# Patient Record
Sex: Male | Born: 1975 | Race: Black or African American | Hispanic: No | Marital: Married | Smoking: Never smoker
Health system: Southern US, Community
[De-identification: ages and names within clinical notes are randomized; demographics above are authoritative.]

## PROBLEM LIST (undated history)

## (undated) DIAGNOSIS — F419 Anxiety disorder, unspecified: Secondary | ICD-10-CM

## (undated) DIAGNOSIS — T7840XA Allergy, unspecified, initial encounter: Secondary | ICD-10-CM

## (undated) DIAGNOSIS — G5600 Carpal tunnel syndrome, unspecified upper limb: Secondary | ICD-10-CM

## (undated) DIAGNOSIS — F329 Major depressive disorder, single episode, unspecified: Secondary | ICD-10-CM

## (undated) DIAGNOSIS — F32A Depression, unspecified: Secondary | ICD-10-CM

## (undated) DIAGNOSIS — J302 Other seasonal allergic rhinitis: Secondary | ICD-10-CM

## (undated) DIAGNOSIS — E559 Vitamin D deficiency, unspecified: Secondary | ICD-10-CM

## (undated) DIAGNOSIS — R7989 Other specified abnormal findings of blood chemistry: Secondary | ICD-10-CM

## (undated) DIAGNOSIS — L309 Dermatitis, unspecified: Secondary | ICD-10-CM

## (undated) DIAGNOSIS — R011 Cardiac murmur, unspecified: Secondary | ICD-10-CM

## (undated) DIAGNOSIS — G4700 Insomnia, unspecified: Secondary | ICD-10-CM

## (undated) HISTORY — PX: NO PAST SURGERIES: SHX2092

## (undated) HISTORY — DX: Allergy, unspecified, initial encounter: T78.40XA

## (undated) HISTORY — DX: Dermatitis, unspecified: L30.9

## (undated) HISTORY — DX: Other specified abnormal findings of blood chemistry: R79.89

## (undated) HISTORY — DX: Other seasonal allergic rhinitis: J30.2

## (undated) HISTORY — DX: Depression, unspecified: F32.A

## (undated) HISTORY — DX: Vitamin D deficiency, unspecified: E55.9

## (undated) HISTORY — PX: COLONOSCOPY: SHX174

## (undated) HISTORY — DX: Anxiety disorder, unspecified: F41.9

## (undated) HISTORY — DX: Insomnia, unspecified: G47.00

---
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
## 2000-03-25 ENCOUNTER — Emergency Department (HOSPITAL_COMMUNITY): Admission: EM | Admit: 2000-03-25 | Discharge: 2000-03-25 | Payer: Self-pay | Admitting: Emergency Medicine

## 2002-08-26 ENCOUNTER — Ambulatory Visit (HOSPITAL_COMMUNITY): Admission: RE | Admit: 2002-08-26 | Discharge: 2002-08-26 | Payer: Self-pay | Admitting: Family Medicine

## 2004-06-29 ENCOUNTER — Emergency Department (HOSPITAL_COMMUNITY): Admission: EM | Admit: 2004-06-29 | Discharge: 2004-06-29 | Payer: Self-pay | Admitting: Emergency Medicine

## 2007-05-13 ENCOUNTER — Emergency Department (HOSPITAL_COMMUNITY): Admission: EM | Admit: 2007-05-13 | Discharge: 2007-05-13 | Payer: Self-pay | Admitting: Family Medicine

## 2007-05-14 ENCOUNTER — Encounter: Payer: Self-pay | Admitting: Family Medicine

## 2007-06-13 ENCOUNTER — Emergency Department (HOSPITAL_COMMUNITY): Admission: EM | Admit: 2007-06-13 | Discharge: 2007-06-14 | Payer: Self-pay | Admitting: Emergency Medicine

## 2007-06-15 ENCOUNTER — Ambulatory Visit: Payer: Self-pay | Admitting: Family Medicine

## 2009-06-26 ENCOUNTER — Emergency Department (HOSPITAL_COMMUNITY): Admission: EM | Admit: 2009-06-26 | Discharge: 2009-06-26 | Payer: Self-pay | Admitting: Family Medicine

## 2010-06-12 NOTE — Letter (Signed)
Summary: RPC chart  RPC chart   Imported By: Curtis Sites 12/01/2009 11:31:30  _____________________________________________________________________  External Attachment:    Type:   Image     Comment:   External Document

## 2010-06-12 NOTE — Letter (Signed)
Summary: progress notes  progress notes   Imported By: Curtis Sites 12/01/2009 11:32:18  _____________________________________________________________________  External Attachment:    Type:   Image     Comment:   External Document

## 2010-09-28 NOTE — Procedures (Signed)
   Gabriel [name], Gabriel [name]                       ACCOUNT NO.:  [id number]   MEDICAL RECORD NO.:  [id number]                   PATIENT TYPE:  OUT   LOCATION:  RAD                                  FACILITY:  [facility]   PHYSICIAN:  Vida Roller, M.D.                DATE OF BIRTH:  12-Dec-1975   DATE OF PROCEDURE:  08/26/2002  DATE OF DISCHARGE:                                  ECHOCARDIOGRAM   Tape Number [id number], tape count [id number] through 5566.   This is a 35 year old gentleman with a murmur.  Technical quality of the  study is good.   M MODE TRACINGS:  The aorta is 32 mm.   The left atrium is 32 mm.   Septum is 13 mm which is enlarged.   Posterior wall is 13 mm which is enlarged.   Left ventricular diastolic dimension is 49 mm.   Left ventricular systolic dimension is 32 mm.   TWO-D DOPPLER IMAGING:  The left ventricle is normal size with mild  concentric left ventricular hypertrophy.  Systolic function appears normal,  and diastolic function in this limited study also appears normal.  There  were no wall motion abnormalities seen.   The right ventricle is normal size with normal systolic function.   Both atria are normal size.  There is no evidence of atrioseptal defect.   The aortic valve is morphologically unremarkable with trace insufficiency,  and no stenosis is seen.   The mitral valve is morphologically unremarkable with trace to mild  insufficiency.  No stenosis is seen.   The tricuspid valve is morphologically unremarkable with mild insufficiency.  No stenosis is seen.   The pulmonic valve is not well seen but appears to have trace insufficiency.   Pericardial structures appear normal.   The ascending aorta appears normal.   The inferior vena cava appears to be normal size.                                               Vida Roller, M.D.    JH/MEDQ  D:  08/27/2002  T:  08/27/2002  Job:  [id number]

## 2011-02-01 LAB — CBC
HCT: 45.5
Hemoglobin: 15.6
WBC: 9.2

## 2011-02-01 LAB — COMPREHENSIVE METABOLIC PANEL
Albumin: 4
Alkaline Phosphatase: 82
BUN: 16
Chloride: 104
Glucose, Bld: 129 — ABNORMAL HIGH
Potassium: 3.9
Total Bilirubin: 0.9

## 2011-02-01 LAB — DIFFERENTIAL
Basophils Absolute: 0
Basophils Relative: 0
Monocytes Absolute: 0.5
Neutro Abs: 8.3 — ABNORMAL HIGH
Neutrophils Relative %: 90 — ABNORMAL HIGH

## 2012-10-08 ENCOUNTER — Encounter: Payer: Self-pay | Admitting: Family Medicine

## 2012-10-15 ENCOUNTER — Encounter: Payer: Self-pay | Admitting: Family Medicine

## 2012-10-19 ENCOUNTER — Encounter: Payer: Self-pay | Admitting: Family Medicine

## 2012-10-19 ENCOUNTER — Ambulatory Visit (INDEPENDENT_AMBULATORY_CARE_PROVIDER_SITE_OTHER): Payer: BC Managed Care – PPO | Admitting: Family Medicine

## 2012-10-19 VITALS — BP 120/82 | HR 74 | Resp 18 | Ht 72.5 in | Wt 197.1 lb

## 2012-10-19 DIAGNOSIS — L2084 Intrinsic (allergic) eczema: Secondary | ICD-10-CM | POA: Insufficient documentation

## 2012-10-19 DIAGNOSIS — J3089 Other allergic rhinitis: Secondary | ICD-10-CM

## 2012-10-19 DIAGNOSIS — R7302 Impaired glucose tolerance (oral): Secondary | ICD-10-CM

## 2012-10-19 DIAGNOSIS — J309 Allergic rhinitis, unspecified: Secondary | ICD-10-CM

## 2012-10-19 DIAGNOSIS — L209 Atopic dermatitis, unspecified: Secondary | ICD-10-CM

## 2012-10-19 DIAGNOSIS — R7309 Other abnormal glucose: Secondary | ICD-10-CM

## 2012-10-19 DIAGNOSIS — R5381 Other malaise: Secondary | ICD-10-CM

## 2012-10-19 DIAGNOSIS — R5383 Other fatigue: Secondary | ICD-10-CM

## 2012-10-19 DIAGNOSIS — L2089 Other atopic dermatitis: Secondary | ICD-10-CM

## 2012-10-19 DIAGNOSIS — Z139 Encounter for screening, unspecified: Secondary | ICD-10-CM

## 2012-10-19 DIAGNOSIS — R7303 Prediabetes: Secondary | ICD-10-CM

## 2012-10-19 MED ORDER — FLUTICASONE PROPIONATE 50 MCG/ACT NA SUSP: 1.0000 | Freq: Every day | NASAL | Status: DC

## 2012-10-19 MED ORDER — LORATADINE 10 MG PO TABS: 10.0000 mg | ORAL_TABLET | Freq: Every day | ORAL | Status: DC | PRN

## 2012-10-19 MED ORDER — TRIAMCINOLONE ACETONIDE 0.025 % EX OINT: TOPICAL_OINTMENT | CUTANEOUS | Status: DC

## 2012-10-19 NOTE — Progress Notes (Signed)
  Subjective:    Patient ID: Gabriel [name], male    DOB: 05/28/1975, 37 y.o.   MRN: [id number]  HPI  The PT is here to  Re establish care, last seen over 4 years ago He is generally good health, just wants to be checked for chronic illness in his family, and to ge a  "check up"   Preventive health is updated, specifically Immunization.   . 3 month h/o intermittent left hand pain and tingling/numbness, wakes him at night he has to flick for relief. No weakness or residual loss of sensation Year round allergies, sneezing , runny nose , watery eyes, denies associated fever , chills, productive cough or yellow nasal drainage States he iis aware diet and commitment to regular exercise need to improve so that his health does , and he is invested in this change  Review of Systems See HPI Denies recent fever or chills. Denies ear pain or sore throat. Denies chest congestion, productive cough or wheezing. Denies chest pains, palpitations and leg swelling Denies abdominal pain, nausea, vomiting,diarrhea or constipation.   Denies dysuria, frequency, hesitancy or incontinence.  Denies headaches, seizures, numbness, or tingling. Denies depression, anxiety or insomnia. C/o pruritic rash on back, has established dx of eczema        Objective:   Physical Exam  Patient alert and oriented and in no cardiopulmonary distress.  HEENT: No facial asymmetry, EOMI, no sinus tenderness,  oropharynx pink and moist.  Neck supple no adenopathy.  Chest: Clear to auscultation bilaterally.  CVS: S1, S2 no murmurs, no S3.  ABD: Soft non tender. Bowel sounds normal.  Ext: No edema  MS: Adequate ROM spine, shoulders, hips and knees.  Skin: Intact, eczema moderately severe, dispersed extensively on upper back with hyperpigmented macular lesions  Psych: Good eye contact, normal affect. Memory intact not anxious or depressed appearing.  CNS: CN 2-12 intact, power, tone and sensation normal  throughout.       Assessment & Plan:

## 2012-10-19 NOTE — Patient Instructions (Addendum)
F/u in 5.5 month  Call if you need me before  Continue daily physical activity.  Please work on eating habits, healthy food choices are fresh or frozen fruit and vegetable, aim for 66% of intake to be this.Water is the healthiest drink  Reduce cheese, fried and fatty foods and red meat, and egg yolks.Eat a lot baked fish, white chicken meat, [place]. Beans and lentils , no fat back, are excellent sources of protein and fiber  Tingling in left hand sounds like carpal tunnel, when you decide you want further investigation, pls call I will refer you to the specialist of your choice  You are mildly overweight, ideal weight is about 185 pounds, changing foods and consistency in exercise will get you there   New for allergies is flonase spray, use daily, and buy generic loratidine (claritin) you will get script  Fasting CBC, lipid, chem7, TSH, HBA1C, vit D

## 2012-10-22 LAB — CBC WITH DIFFERENTIAL/PLATELET
Basophils Absolute: 0.1 10*3/uL (ref 0.0–0.1)
Basophils Relative: 1 % (ref 0–1)
Hemoglobin: 14.9 g/dL (ref 13.0–17.0)
MCHC: 34.2 g/dL (ref 30.0–36.0)
Monocytes Relative: 7 % (ref 3–12)
Neutro Abs: 2.1 10*3/uL (ref 1.7–7.7)
Neutrophils Relative %: 42 % — ABNORMAL LOW (ref 43–77)

## 2012-10-22 LAB — LIPID PANEL
Cholesterol: 147 mg/dL (ref 0–200)
Triglycerides: 82 mg/dL (ref <150)
VLDL: 16 mg/dL (ref 0–40)

## 2012-10-22 LAB — BASIC METABOLIC PANEL
BUN: 14 mg/dL (ref 6–23)
CO2: 31 mEq/L (ref 19–32)
Chloride: 102 mEq/L (ref 96–112)
Creat: 1.15 mg/dL (ref 0.50–1.35)
Glucose, Bld: 99 mg/dL (ref 70–99)

## 2012-10-31 ENCOUNTER — Encounter: Payer: Self-pay | Admitting: Family Medicine

## 2012-10-31 DIAGNOSIS — R7302 Impaired glucose tolerance (oral): Secondary | ICD-10-CM | POA: Insufficient documentation

## 2012-10-31 NOTE — Assessment & Plan Note (Signed)
positive family h/o IGT/diabetes. Pt needs to focus on low carb, healthy food choice and commitent to exercise, to reduce his risk of developing diabetes, he has been made aware of this

## 2012-10-31 NOTE — Assessment & Plan Note (Signed)
Educated re need to commit to daily medication for symptom control

## 2012-10-31 NOTE — Assessment & Plan Note (Signed)
moderate potency steroid cream prescribed for as needed, use. Current , moderate flare

## 2012-11-03 NOTE — Addendum Note (Signed)
Addended by: Kandis Fantasia B on: 11/03/2012 11:49 AM   Modules accepted: Orders

## 2013-03-18 ENCOUNTER — Other Ambulatory Visit: Payer: Self-pay

## 2013-04-22 ENCOUNTER — Ambulatory Visit: Payer: BC Managed Care – PPO | Admitting: Family Medicine

## 2013-05-20 ENCOUNTER — Other Ambulatory Visit: Payer: Self-pay | Admitting: Family Medicine

## 2013-05-20 ENCOUNTER — Ambulatory Visit (INDEPENDENT_AMBULATORY_CARE_PROVIDER_SITE_OTHER): Payer: BC Managed Care – PPO | Admitting: Family Medicine

## 2013-05-20 VITALS — BP 116/70 | HR 74 | Resp 18 | Ht 72.5 in | Wt 198.1 lb

## 2013-05-20 DIAGNOSIS — R7302 Impaired glucose tolerance (oral): Secondary | ICD-10-CM

## 2013-05-20 DIAGNOSIS — Z139 Encounter for screening, unspecified: Secondary | ICD-10-CM

## 2013-05-20 DIAGNOSIS — J3089 Other allergic rhinitis: Secondary | ICD-10-CM

## 2013-05-20 DIAGNOSIS — Z13220 Encounter for screening for lipoid disorders: Secondary | ICD-10-CM

## 2013-05-20 DIAGNOSIS — J309 Allergic rhinitis, unspecified: Secondary | ICD-10-CM

## 2013-05-20 DIAGNOSIS — R7309 Other abnormal glucose: Secondary | ICD-10-CM

## 2013-05-20 DIAGNOSIS — E663 Overweight: Secondary | ICD-10-CM

## 2013-05-20 LAB — HEMOGLOBIN A1C
HEMOGLOBIN A1C: 6.4 % — AB (ref <5.7)
Mean Plasma Glucose: 137 mg/dL — ABNORMAL HIGH (ref <117)

## 2013-05-20 NOTE — Patient Instructions (Signed)
F/u in 6 month, call if you need me before  You will be contacted re labs  pls continue to make changes in eating  Fasting lipid, chem 7, HBA1C , CBC in 6 months, just before next visit  Watch intermittent cramping, call if worsens, you will get list of potassium rich foods

## 2013-05-20 NOTE — Progress Notes (Signed)
   Subjective:    Patient ID: Gabriel [name], male    DOB: 10/29/1975, 38 y.o.   MRN: [id number]  HPI Pt in for general follow up specifically because of worsened HBA1C, now 6.4.Pt has been aware of this problem has worked at correcting it inconsistently, now appears to be more concerned and wants to go for individual dietary counseling also. Allergies are not currently an issue  Review of Systems See HPI Denies recent fever or chills. Denies sinus pressure, nasal congestion, ear pain or sore throat. Denies chest congestion, productive cough or wheezing. Denies chest pains, palpitations and leg swelling Denies abdominal pain, nausea, vomiting,diarrhea or constipation.   Denies dysuria, frequency, hesitancy or incontinence. Denies joint pain, swelling and limitation in mobility.        Objective:   Physical Exam BP 116/70  Pulse 74  Resp 18  Ht 6' 0.5" (1.842 m)  Wt 198 lb 1.9 oz (89.867 kg)  BMI 26.49 kg/m2  SpO2 98% Patient alert and oriented and in no cardiopulmonary distress.  HEENT: No facial asymmetry, EOMI, no sinus tenderness,  oropharynx pink and moist.  Neck supple no adenopathy.  Chest: Clear to auscultation bilaterally.  CVS: S1, S2 no murmurs, no S3.  ABD: Soft non tender. Bowel sounds normal.  Ext: No edema  MS: Adequate ROM spine, shoulders, hips and knees.  Skin: Intact, no ulcerations or rash noted.  Psych: Good eye contact, normal affect. Memory intact not anxious or depressed appearing.  CNS: CN 2-12 intact, power, tone and sensation normal throughout.        Assessment & Plan:  IGT (impaired glucose tolerance) Deteriorated. Patient educated about the importance of limiting  Carbohydrate intake , the need to commit to daily physical activity for a minimum of 30 minutes , and to commit weight loss. The fact that changes in all these areas will reduce or eliminate all together the development of diabetes is stressed.   Referred  forindividual nutritional ed  Overweight (BMI 25.0-29.9) Unchanged Patient re-educated about  the importance of commitment to a  minimum of 150 minutes of exercise per week. The importance of healthy food choices with portion control discussed. Encouraged to start a food diary, count calories and to consider  joining a support group. Sample diet sheets offered. Goals set by the patient for the next several months.     Perennial allergic rhinitis No current flare , currently needs no medication, uses OTC meds on as needed basis only which is appropriate

## 2013-05-21 ENCOUNTER — Telehealth (HOSPITAL_COMMUNITY): Payer: Self-pay | Admitting: Dietician

## 2013-05-21 NOTE — Telephone Encounter (Signed)
Received referral via fax from Dr. Simpson for dx: prediabetes, obesity.  

## 2013-05-21 NOTE — Addendum Note (Signed)
Addended by: Kandis FantasiaSLADE, COURTNEY B on: 05/21/2013 11:37 AM   Modules accepted: Orders

## 2013-05-21 NOTE — Telephone Encounter (Signed)
Called [phone]. Appointment scheduled for 04/03/14 at 1100.

## 2013-06-01 ENCOUNTER — Telehealth: Payer: Self-pay

## 2013-06-01 DIAGNOSIS — J111 Influenza due to unidentified influenza virus with other respiratory manifestations: Secondary | ICD-10-CM

## 2013-06-01 DIAGNOSIS — R69 Illness, unspecified: Principal | ICD-10-CM

## 2013-06-01 MED ORDER — OSELTAMIVIR PHOSPHATE 75 MG PO CAPS: 75.0000 mg | ORAL_CAPSULE | Freq: Two times a day (BID) | ORAL | Status: DC

## 2013-06-01 NOTE — Telephone Encounter (Signed)
Acute onset of fever yes Headache yes Body ache yes Fatigue yes Non productive cough no Sore throat no Nasal discharge yes  Onset between 1-4 days of possible exposure yes  Recommendation:  Fluid, rest, Tylenol for control of fever  Expect 5 days before feeling better and not so contagious and generally better in 7-10 days.  Standard treatment Tama flu 75 mg 1 tablet twice daily times 5 days  Please call office if symptoms do not improve or worsen in 5 days after beginning treatment.   Patient aware of all advise.  Tamiflu started.

## 2013-06-03 ENCOUNTER — Encounter: Payer: Self-pay | Admitting: Family Medicine

## 2013-06-03 ENCOUNTER — Encounter (HOSPITAL_COMMUNITY): Payer: Self-pay | Admitting: Dietician

## 2013-06-03 ENCOUNTER — Ambulatory Visit (INDEPENDENT_AMBULATORY_CARE_PROVIDER_SITE_OTHER): Payer: BC Managed Care – PPO | Admitting: Family Medicine

## 2013-06-03 VITALS — BP 112/78 | HR 60 | Temp 98.5°F | Resp 16 | Ht 72.5 in | Wt 190.8 lb

## 2013-06-03 DIAGNOSIS — J111 Influenza due to unidentified influenza virus with other respiratory manifestations: Secondary | ICD-10-CM | POA: Insufficient documentation

## 2013-06-03 DIAGNOSIS — B9689 Other specified bacterial agents as the cause of diseases classified elsewhere: Secondary | ICD-10-CM

## 2013-06-03 DIAGNOSIS — J019 Acute sinusitis, unspecified: Secondary | ICD-10-CM

## 2013-06-03 MED ORDER — BENZONATATE 100 MG PO CAPS: 100.0000 mg | ORAL_CAPSULE | Freq: Three times a day (TID) | ORAL | Status: DC | PRN

## 2013-06-03 MED ORDER — AZITHROMYCIN 250 MG PO TABS: ORAL_TABLET | ORAL | Status: AC

## 2013-06-03 NOTE — Progress Notes (Signed)
Outpatient Initial Nutrition Assessment  Date:06/03/2013   Appt Start Time: 1104  Referring Physician: Dr. Moshe Cipro Reason for Visit: prediabetes  Nutrition Assessment:  Height: 6' 0.5" (184.2 cm)   Weight: 188 lb (85.276 kg)   IBW: 181#  %IBW: 104% UBW: 198#  %UBW: 95% Body mass index is 25.13 kg/(m^2). Meets criteria for overweight.  Goal Weight: Maintenance.  Weight hx: Wt Readings from Last 10 Encounters:  06/03/13 188 lb (85.276 kg)  06/03/13 190 lb 12.8 oz (86.546 kg)  05/20/13 198 lb 1.9 oz (89.867 kg)  10/19/12 197 lb 1.3 oz (89.395 kg)   Noted 10# (55) wt loss x 3 weeks.  Estimated nutritional needs:  Kcals/ day: 1900-2100 Protein (grams)/day: 68-85 Fluid (L)/ day: 1.9-2.1  PMH: History reviewed. No pertinent past medical history.  Medications:  Current Outpatient Rx  Name  Route  Sig  Dispense  Refill  . azithromycin (ZITHROMAX Z-PAK) 250 MG tablet      Take 2 tablets (500 mg) on  Day 1,  followed by 1 tablet (250 mg) once daily on Days 2 through 5.   6 each   0   . benzonatate (TESSALON) 100 MG capsule   Oral   Take 1 capsule (100 mg total) by mouth 3 (three) times daily as needed for cough.   20 capsule   0   . fluticasone (FLONASE) 50 MCG/ACT nasal spray   Nasal   Place 1 spray into the nose daily.   16 g   5   . loratadine (CLARITIN) 10 MG tablet   Oral   Take 1 tablet (10 mg total) by mouth daily as needed for allergies.   30 tablet   5   . oseltamivir (TAMIFLU) 75 MG capsule   Oral   Take 1 capsule (75 mg total) by mouth 2 (two) times daily.   10 capsule   0     Labs: CMP     Component Value Date/Time   NA 142 10/22/2012 0811   K 4.5 10/22/2012 0811   CL 102 10/22/2012 0811   CO2 31 10/22/2012 0811   GLUCOSE 99 10/22/2012 0811   BUN 14 10/22/2012 0811   CREATININE 1.15 10/22/2012 0811   CREATININE 1.06 06/14/2007 0100   CALCIUM 9.1 10/22/2012 0811   PROT 6.9 06/14/2007 0100   ALBUMIN 4.0 06/14/2007 0100   AST 51* 06/14/2007 0100   ALT 61*  06/14/2007 0100   ALKPHOS 82 06/14/2007 0100   BILITOT 0.9 06/14/2007 0100   GFRNONAA >60 06/14/2007 0100   GFRAA  Value: >60        The eGFR has been calculated using the MDRD equation. This calculation has not been validated in all clinical 06/14/2007 0100    Lipid Panel     Component Value Date/Time   CHOL 147 10/22/2012 0811   TRIG 82 10/22/2012 0811   HDL 40 10/22/2012 0811   CHOLHDL 3.7 10/22/2012 0811   VLDL 16 10/22/2012 0811   LDLCALC 91 10/22/2012 0811     Lab Results  Component Value Date   HGBA1C 6.4* 05/20/2013   HGBA1C 6.0* 10/22/2012   Lab Results  Component Value Date   LDLCALC 91 10/22/2012   CREATININE 1.15 10/22/2012     Lifestyle/ social habits: Mr. Borba is a delightful gentleman who resides in [place] with his wife (who works at [employer]) and his two sons, ages 28 and 60. He works as Librarian, academic at [employer], which requires him to travel to multiple  locations in the [place] and [place] area on a daily basis.  He reports he does not exercise outside of his daily routine, but his job requires a lot of walking. He reports a very low stress level.   Nutrition hx/habits: Mr. Gilpatrick reports that due to the nature of his job, he does not have a consistent meal schedule and often eats at fast food restaurants. Most recently, he reports he has been drinking more water and decreasing the amount of fruit juice he drinks. He reports he enjoys water and keeps multiple bottles in a cooler in his car. He is delighted that he has lost weight, but expresses concern about develop comorbidities, due to his strong family hx of diabetes and heart disease.  He skip breakfast 2-3 times per week. Breakfast and lunch are usually at fast food restaurants. Dinner are eaten at home and prepared by wife and are generally healthy. He also admits to consuming sugary beverages such as lemonade and sweet tea.   Diet recall: Breakfast: skip OR sausage egg and cheese biscuit,  orange juice; Lunch: burger, fries, and sweet tea; Dinner: chicken, vegetable, starch, lemonade.   Nutrition Diagnosis: Nutrition-related knowledge deficit r/t no prior diet education AEB Hgb A1c: 6.4.   Nutrition Intervention: Nutrition rx: 1800 kcal NAS, diabetic diet; 3 meals per day; no snacks; low calorie beverages only; limit one starch per meal; choose lean meats, fruits, vegetables, low fat dairy, and whole grain most often; be as active as possible  Education/Counseling Provided: Educated pt on principles of diabetic diet.Reviewed Hgb A1c and discussed how sedentary lifestyle, poor diet, and family hx put pt at high risk for development of diabetes. Reviewed diet recall and discussed discussed specific examples in which pt could choose more healthful options. Educated pt on plate method, portion sizes, and sources of carbohydrate. Discussed importance of regular meal pattern. Discussed importance of adding sources of whole grains to diet to improve glycemic control. Also encouraged to choose low fat dairy, lean meats, and whole fruits and vegetables more often. Discussed options of artificial sweeteners and encouraged pt to use which brand she liked best. Discussed nutritional content of foods commonly eaten and discussed healthier alternatives. Discussed importance of compliance to prevent further complications of disease. Educated pt on importance of physical activity (goal of at least 30 minutes 5 times per week) along with a healthy diet to achieve weight loss and glycemic goals. Encouraged slow, moderate weight loss of 1-2# per week, or 7-10% of current body weight. Provides "Ture Lemon" samples and coupons upon request.  Used TeachBack to assess understanding.   Understanding, Motivation, Ability to Follow Recommendations: Expect good compliance.   Monitoring and Evaluation: Goals: 1) Weight maintenance; 2) Hgb A1c < 5.7  Recommendations: 1) Keep cooler with healthy snacks and water; 2)  Consider packing own lunch or buying off the dollar menu; 3) Choose low calorie drink mixes for variety other than water  F/U: PRN. RD contact information provided.   Mikal Blasdell A. Jimmye Norman, RD, LDN 06/03/2013  Appt EndTime: 9150

## 2013-06-03 NOTE — Patient Instructions (Signed)
F/u as before  You are being treated for influenza and bacterial sinusitis.PLEASE finish the tamiflu, and added is azithromycin . This is an antibiotic  Which you will take for 5 days total,   Work excuse from 1/21 to return 06/07/2013  Influenza A (H1N1) H1N1 formerly called "swine flu" is a new influenza virus causing sickness in people. The H1N1 virus is different from seasonal influenza viruses. However, the H1N1 symptoms are similar to seasonal influenza and it is spread from person to person. You may be at higher risk for serious problems if you have underlying serious medical conditions. The CDC and the [employer] Health Organization are following reported cases around the world. CAUSES   The flu is thought to spread mainly person-to-person through coughing or sneezing of infected people.  A person may become infected by touching something with the virus on it and then touching their mouth or nose. SYMPTOMS   Fever.  Headache.  Tiredness.  Cough.  Sore throat.  Runny or stuffy nose.  Body aches.  Diarrhea and vomiting These symptoms are referred to as "flu-like symptoms." A lot of different illnesses, including the common cold, may have similar symptoms. DIAGNOSIS   There are tests that can tell if you have the H1N1 virus.  Confirmed cases of H1N1 will be reported to the state or local health department.  A doctor's exam may be needed to tell whether you have an infection that is a complication of the flu. HOME CARE INSTRUCTIONS   Stay informed. Visit the [facility] website for current recommendations. Visit [employer].tnwww.cdc.gov/H1N1flu/. You may also call 1-800-CDC-INFO ((367) 598-21111-607-617-4920).  Get help early if you develop any of the above symptoms.  If you are at high risk from complications of the flu, talk to your caregiver as soon as you develop flu-like symptoms. Those at higher risk for complications include:  People 65 years or older.  People with chronic medical  conditions.  Pregnant women.  Young children.  Your caregiver may recommend antiviral medicine to help treat the flu.  If you get the flu, get plenty of rest, drink enough water and fluids to keep your urine clear or pale yellow, and avoid using alcohol or tobacco.  You may take over-the-counter medicine to relieve the symptoms of the flu if your caregiver approves. (Never give aspirin to children or teenagers who have flu-like symptoms, particularly fever). TREATMENT  If you do get sick, antiviral drugs are available. These drugs can make your illness milder and make you feel better faster. Treatment should start soon after illness starts. It is only effective if taken within the first day of becoming ill. Only your caregiver can prescribe antiviral medication.  PREVENTION   Cover your nose and mouth with a tissue or your arm when you cough or sneeze. Throw the tissue away.  Wash your hands often with soap and warm water, especially after you cough or sneeze. Alcohol-based cleaners are also effective against germs.  Avoid touching your eyes, nose or mouth. This is one way germs spread.  Try to avoid contact with sick people. Follow public health advice regarding school closures. Avoid crowds.  Stay home if you get sick. Limit contact with others to keep from infecting them. People infected with the H1N1 virus may be able to infect others anywhere from 1 day before feeling sick to 5-7 days after getting flu symptoms.  An H1N1 vaccine is available to help protect against the virus. In addition to the H1N1 vaccine, you will need to  be vaccinated for seasonal influenza. The H1N1 and seasonal vaccines may be given on the same day. The CDC especially recommends the H1N1 vaccine for:  Pregnant women.  People who live with or care for children younger than 3 months of age.  Health care and emergency services personnel.  Persons between the ages of 47 months through 56 years of age.  People  from ages 1 through 84 years who are at higher risk for H1N1 because of chronic health disorders or immune system problems. FACEMASKS In community and home settings, the use of facemasks and N95 respirators are not normally recommended. In certain circumstances, a facemask or N95 respirator may be used for persons at increased risk of severe illness from influenza. Your caregiver can give additional recommendations for facemask use. IN CHILDREN, EMERGENCY WARNING SIGNS THAT NEED URGENT MEDICAL CARE:  Fast breathing or trouble breathing.  Bluish skin color.  Not drinking enough fluids.  Not waking up or not interacting normally.  Being so fussy that the child does not want to be held.  Your child has an oral temperature above 102 F (38.9 C), not controlled by medicine.  Your baby is older than 3 months with a rectal temperature of 102 F (38.9 C) or higher.  Your baby is 42 months old or younger with a rectal temperature of 100.4 F (38 C) or higher.  Flu-like symptoms improve but then return with fever and worse cough. IN ADULTS, EMERGENCY WARNING SIGNS THAT NEED URGENT MEDICAL CARE:  Difficulty breathing or shortness of breath.  Pain or pressure in the chest or abdomen.  Sudden dizziness.  Confusion.  Severe or persistent vomiting.  Bluish color.  You have a oral temperature above 102 F (38.9 C), not controlled by medicine.  Flu-like symptoms improve but return with fever and worse cough. SEEK IMMEDIATE MEDICAL CARE IF:  You or someone you know is experiencing any of the above symptoms. When you arrive at the emergency center, report that you think you have the flu. You may be asked to wear a mask and/or sit in a secluded area to protect others from getting sick. MAKE SURE YOU:   Understand these instructions.  Will watch your condition.  Will get help right away if you are not doing well or get worse. Some of this information courtesy of the CDC.  Document  Released: 10/16/2007 Document Revised: 07/22/2011 Document Reviewed: 10/16/2007 [facility] Patient Information 2014 [place], [place].

## 2013-06-03 NOTE — Progress Notes (Signed)
   Subjective:    Patient ID: Gabriel [name], male    DOB: 12/14/1975, 38 y.o.   MRN: [id number]  HPI  Pt experienced acute onset of body aches and chills, with fever 2 days ago, he called in and was started on tamiflu. Probably 40% improved, but still has fatigue, headache, bi temopral, body aches, , chills are better, started loose stools since 2 days ago, only 2 today, getting firm, no vomit. Green nasal drainage cough productive of yellow sputum over past 2 days  Review of Systems See HPI  Denies chest pains, palpitations and leg swelling    Denies dysuria, frequency, hesitancy or incontinence. Denies joint pain, swelling and limitation in mobility. Denies headaches, seizures, numbness, or tingling. Denies depression, anxiety or insomnia. Denies skin break down or rash.        Objective:   Physical Exam  Patient alert and oriented and in no cardiopulmonary distress.Ill appearing  HEENT: No facial asymmetry, EOMI, maxillary sinus tenderness,  oropharynx pink and moist.  Neck supple anterior cervical adenopathy.TM clear  Chest: Clear to auscultation bilaterally.  CVS: S1, S2 no murmurs, no S3.  ABD: Soft non tender. Bowel sounds normal.  Ext: No edema  MS: Adequate ROM spine, shoulders, hips and knees.  Skin: Intact, no ulcerations or rash noted.  Psych: Good eye contact, normal affect. Memory intact not anxious or depressed appearing.  CNS: CN 2-12 intact, power, tone and sensation normal throughout.       Assessment & Plan:

## 2013-06-06 DIAGNOSIS — B9689 Other specified bacterial agents as the cause of diseases classified elsewhere: Secondary | ICD-10-CM | POA: Insufficient documentation

## 2013-06-06 DIAGNOSIS — J019 Acute sinusitis, unspecified: Secondary | ICD-10-CM

## 2013-06-06 NOTE — Assessment & Plan Note (Signed)
Acute influenza improving pt to complete tamiflu, out of work for rest of the week Treatment for sinusitis and bronchitis started, z pack and decongestant prescribed

## 2013-06-06 NOTE — Assessment & Plan Note (Signed)
z pack and decongestant prescribed 

## 2013-06-17 ENCOUNTER — Other Ambulatory Visit: Payer: Self-pay

## 2013-06-17 DIAGNOSIS — L209 Atopic dermatitis, unspecified: Secondary | ICD-10-CM

## 2013-06-17 MED ORDER — TRIAMCINOLONE ACETONIDE 0.025 % EX OINT: TOPICAL_OINTMENT | CUTANEOUS | Status: DC

## 2013-07-03 ENCOUNTER — Encounter: Payer: Self-pay | Admitting: Family Medicine

## 2013-07-03 DIAGNOSIS — E663 Overweight: Secondary | ICD-10-CM | POA: Insufficient documentation

## 2013-07-03 NOTE — Assessment & Plan Note (Signed)
Deteriorated. Patient educated about the importance of limiting  Carbohydrate intake , the need to commit to daily physical activity for a minimum of 30 minutes , and to commit weight loss. The fact that changes in all these areas will reduce or eliminate all together the development of diabetes is stressed.   Referred forindividual nutritional ed

## 2013-07-03 NOTE — Assessment & Plan Note (Signed)
Unchanged. Patient re-educated about  the importance of commitment to a  minimum of 150 minutes of exercise per week. The importance of healthy food choices with portion control discussed. Encouraged to start a food diary, count calories and to consider  joining a support group. Sample diet sheets offered. Goals set by the patient for the next several months.    

## 2013-07-03 NOTE — Assessment & Plan Note (Signed)
No current flare , currently needs no medication, uses OTC meds on as needed basis only which is appropriate

## 2013-07-05 ENCOUNTER — Telehealth: Payer: Self-pay | Admitting: Family Medicine

## 2013-07-05 NOTE — Telephone Encounter (Signed)
Called and spoke with patient.  He is aware that he only need labs done in 3 months and not ov at this time.  Will send reminder letter on 4/1

## 2013-08-26 LAB — BASIC METABOLIC PANEL
BUN: 14 mg/dL (ref 6–23)
CHLORIDE: 105 meq/L (ref 96–112)
CO2: 31 meq/L (ref 19–32)
CREATININE: 0.98 mg/dL (ref 0.50–1.35)
Calcium: 9.1 mg/dL (ref 8.4–10.5)
GLUCOSE: 88 mg/dL (ref 70–99)
Potassium: 4.2 mEq/L (ref 3.5–5.3)
Sodium: 141 mEq/L (ref 135–145)

## 2013-08-27 ENCOUNTER — Encounter: Payer: Self-pay | Admitting: Family Medicine

## 2013-08-27 LAB — HEMOGLOBIN A1C
Hgb A1c MFr Bld: 6.1 % — ABNORMAL HIGH (ref <5.7)
Mean Plasma Glucose: 128 mg/dL — ABNORMAL HIGH (ref <117)

## 2013-10-06 ENCOUNTER — Telehealth: Payer: Self-pay

## 2013-10-06 DIAGNOSIS — R197 Diarrhea, unspecified: Secondary | ICD-10-CM

## 2013-10-06 NOTE — Telephone Encounter (Signed)
Has had diarrhea since Sunday. Going about 2-3 times per day. Very watery. Slight nausea but mainly feeling weak. Wife Durward Mallard doesn't want him taking any immodium since it may be something viral that needs to get out of his system. She is just concerned about dehydration. Wants some labs done on him. Please advise

## 2013-10-06 NOTE — Telephone Encounter (Signed)
Faxed over to wife info on diarrhea and lab order to be done tomorrow. She is aware

## 2013-10-07 ENCOUNTER — Encounter: Payer: Self-pay | Admitting: Family Medicine

## 2013-10-07 ENCOUNTER — Ambulatory Visit (INDEPENDENT_AMBULATORY_CARE_PROVIDER_SITE_OTHER): Payer: BC Managed Care – PPO | Admitting: Family Medicine

## 2013-10-07 VITALS — BP 112/74 | HR 60 | Resp 16 | Wt 179.8 lb

## 2013-10-07 DIAGNOSIS — K529 Noninfective gastroenteritis and colitis, unspecified: Secondary | ICD-10-CM

## 2013-10-07 DIAGNOSIS — E663 Overweight: Secondary | ICD-10-CM

## 2013-10-07 DIAGNOSIS — K5289 Other specified noninfective gastroenteritis and colitis: Secondary | ICD-10-CM

## 2013-10-07 LAB — BASIC METABOLIC PANEL
BUN: 15 mg/dL (ref 6–23)
CALCIUM: 8.9 mg/dL (ref 8.4–10.5)
CHLORIDE: 100 meq/L (ref 96–112)
CO2: 31 mEq/L (ref 19–32)
CREATININE: 1.12 mg/dL (ref 0.50–1.35)
Glucose, Bld: 82 mg/dL (ref 70–99)
Potassium: 4 mEq/L (ref 3.5–5.3)
Sodium: 138 mEq/L (ref 135–145)

## 2013-10-07 NOTE — Progress Notes (Signed)
   Subjective:    Patient ID: Gabriel [name], male    DOB: 03-15-1976, 38 y.o.   MRN: [id number]  HPI Reports acute di arreah and  nausea no vomit started 5 days ago, at its worst he had 5 loose stool starting Monday, yesterday maybe 4 , started immodium yesterday, stool is less soft, states generally feeling some better, but requesting 2 days out oof work to  Fully recover , has worked up to present time. He is a driver   Review of Systems See HPI Denies recent fever , did have a few chills. Denies sinus pressure, nasal congestion, ear pain or sore throat. Denies chest congestion, productive cough or wheezing. Denies chest pains, palpitations and leg swelling    Denies skin break down or rash.        Objective:   Physical Exam BP 112/74  Pulse 60  Resp 16  Wt 179 lb 12.8 oz (81.557 kg)  SpO2 99% Patient alert and oriented and in no cardiopulmonary distress.  HEENT: No facial asymmetry, EOMI, no sinus tenderness,  oropharynx pink and moist.  Neck supple no adenopathy.  Chest: Clear to auscultation bilaterally.  CVS: S1, S2 no murmurs, no S3.  ABD: Soft diffue superficial  Tenderness, no guarding or rebound, hyperactive BS Ext: No edema   Psych: Good eye contact, normal affect. Memory intact not anxious or depressed appearing.  CNS: CN 2-12 intact, power,  normal throughout.        Assessment & Plan:  Acute gastroenteritis Acute onset 4 days ago, improving , but needs day out on sick, reviewed BRAT and use of imodium for symptom control  Overweight (BMI 25.0-29.9) Improved. Pt applauded on succesful weight loss through lifestyle change, and encouraged to continue same. Weight loss goal set for the next several months.

## 2013-10-07 NOTE — Patient Instructions (Signed)
F/u as before  Work excuse from today to return in 2 days  Diet for Diarrhea, Adult Frequent, runny stools (diarrhea) may be caused or worsened by food or drink. Diarrhea may be relieved by changing your diet. Since diarrhea can last up to 7 days, it is easy for you to lose too much fluid from the body and become dehydrated. Fluids that are lost need to be replaced. Along with a modified diet, make sure you drink enough fluids to keep your urine clear or pale yellow. DIET INSTRUCTIONS  Ensure adequate fluid intake (hydration): have 1 cup (8 oz) of fluid for each diarrhea episode. Avoid fluids that contain simple sugars or sports drinks, fruit juices, whole milk products, and sodas. Your urine should be clear or pale yellow if you are drinking enough fluids. Hydrate with an oral rehydration solution that you can purchase at pharmacies, retail stores, and online. You can prepare an oral rehydration solution at home by mixing the following ingredients together:    tsp table salt.   tsp baking soda.   tsp salt substitute containing potassium chloride.  1  tablespoons sugar.  1 L (34 oz) of water.  Certain foods and beverages may increase the speed at which food moves through the gastrointestinal (GI) tract. These foods and beverages should be avoided and include:  Caffeinated and alcoholic beverages.  High-fiber foods, such as raw fruits and vegetables, nuts, seeds, and whole grain breads and cereals.  Foods and beverages sweetened with sugar alcohols, such as xylitol, sorbitol, and mannitol.  Some foods may be well tolerated and may help thicken stool including:  Starchy foods, such as rice, toast, pasta, low-sugar cereal, oatmeal, grits, baked potatoes, crackers, and bagels.   Bananas.   Applesauce.  Add probiotic-rich foods to help increase healthy bacteria in the GI tract, such as yogurt and fermented milk products. RECOMMENDED FOODS AND BEVERAGES Starches Choose foods with  less than 2 g of fiber per serving.  Recommended:  White, [place], and pita breads, plain rolls, buns, bagels. Plain muffins, matzo. Soda, saltine, or graham crackers. Pretzels, melba toast, zwieback. Cooked cereals made with water: cornmeal, farina, cream cereals. Dry cereals: refined corn, wheat, rice. Potatoes prepared any way without skins, refined macaroni, spaghetti, noodles, refined rice.  Avoid:  Bread, rolls, or crackers made with whole wheat, multi-grains, rye, bran seeds, nuts, or coconut. Corn tortillas or taco shells. Cereals containing whole grains, multi-grains, bran, coconut, nuts, raisins. Cooked or dry oatmeal. Coarse wheat cereals, granola. Cereals advertised as "high-fiber." Potato skins. Whole grain pasta, wild or brown rice. Popcorn. Sweet potatoes, yams. Sweet rolls, doughnuts, waffles, pancakes, sweet breads. Vegetables  Recommended: Strained tomato and vegetable juices. Most well-cooked and canned vegetables without seeds. Fresh: Tender lettuce, cucumber without the skin, cabbage, spinach, bean sprouts.  Avoid: Fresh, cooked, or canned: Artichokes, baked beans, beet greens, broccoli, Brussels sprouts, corn, kale, legumes, peas, sweet potatoes. Cooked: Green or red cabbage, spinach. Avoid large servings of any vegetables because vegetables shrink when cooked, and they contain more fiber per serving than fresh vegetables. Fruit  Recommended: Cooked or canned: Apricots, applesauce, cantaloupe, cherries, fruit cocktail, grapefruit, grapes, kiwi, mandarin oranges, peaches, pears, plums, watermelon. Fresh: Apples without skin, ripe banana, grapes, cantaloupe, cherries, grapefruit, peaches, oranges, plums. Keep servings limited to  cup or 1 piece.  Avoid: Fresh: Apples with skin, apricots, mangoes, pears, raspberries, strawberries. Prune juice, stewed or dried prunes. Dried fruits, raisins, dates. Large servings of all fresh fruits. Protein  Recommended: Ground or  well-cooked  tender beef, ham, veal, lamb, pork, or poultry. Eggs. Fish, oysters, shrimp, lobster, other seafoods. Liver, organ meats.  Avoid: Tough, fibrous meats with gristle. Peanut butter, smooth or chunky. Cheese, nuts, seeds, legumes, dried peas, beans, lentils. Dairy  Recommended: Yogurt, lactose-free milk, kefir, drinkable yogurt, buttermilk, soy milk, or plain hard cheese.  Avoid: Milk, chocolate milk, beverages made with milk, such as milkshakes. Soups  Recommended: Bouillon, broth, or soups made from allowed foods. Any strained soup.  Avoid: Soups made from vegetables that are not allowed, cream or milk-based soups. Desserts and Sweets  Recommended: Sugar-free gelatin, sugar-free frozen ice pops made without sugar alcohol.  Avoid: Plain cakes and cookies, pie made with fruit, pudding, custard, cream pie. Gelatin, fruit, ice, sherbet, frozen ice pops. Ice cream, ice milk without nuts. Plain hard candy, honey, jelly, molasses, syrup, sugar, chocolate syrup, gumdrops, marshmallows. Fats and Oils  Recommended: Limit fats to less than 8 tsp per day.  Avoid: Seeds, nuts, olives, avocados. Margarine, butter, cream, mayonnaise, salad oils, plain salad dressings. Plain gravy, crisp bacon without rind. Beverages  Recommended: Water, decaffeinated teas, oral rehydration solutions, sugar-free beverages not sweetened with sugar alcohols.  Avoid: Fruit juices, caffeinated beverages (coffee, tea, soda), alcohol, sports drinks, or lemon-lime soda. Condiments  Recommended: Ketchup, mustard, horseradish, vinegar, cocoa powder. Spices in moderation: allspice, basil, bay leaves, celery powder or leaves, cinnamon, cumin powder, curry powder, ginger, mace, marjoram, onion or garlic powder, oregano, paprika, parsley flakes, ground pepper, rosemary, sage, savory, tarragon, thyme, turmeric.  Avoid: Coconut, honey. Document Released: 07/20/2003 Document Revised: 01/22/2012 Document Reviewed:  09/13/2011 [facility] Patient Information 2014 [place], [place].

## 2013-10-09 NOTE — Assessment & Plan Note (Signed)
Improved. Pt applauded on succesful weight loss through lifestyle change, and encouraged to continue same. Weight loss goal set for the next several months.  

## 2013-10-09 NOTE — Assessment & Plan Note (Signed)
Acute onset 4 days ago, improving , but needs day out on sick, reviewed BRAT and use of imodium for symptom control

## 2013-11-18 ENCOUNTER — Ambulatory Visit: Payer: BC Managed Care – PPO | Admitting: Family Medicine

## 2014-01-04 ENCOUNTER — Telehealth: Payer: Self-pay | Admitting: Family Medicine

## 2014-01-04 NOTE — Telephone Encounter (Signed)
Lab order faxed to lab. 

## 2014-01-08 ENCOUNTER — Other Ambulatory Visit: Payer: Self-pay | Admitting: Family Medicine

## 2014-01-08 LAB — CBC
HCT: 45.5 % (ref 39.0–52.0)
Hemoglobin: 15.1 g/dL (ref 13.0–17.0)
MCH: 28.8 pg (ref 26.0–34.0)
MCHC: 33.2 g/dL (ref 30.0–36.0)
MCV: 86.7 fL (ref 78.0–100.0)
PLATELETS: 320 10*3/uL (ref 150–400)
RBC: 5.25 MIL/uL (ref 4.22–5.81)
RDW: 13.2 % (ref 11.5–15.5)
WBC: 4.3 10*3/uL (ref 4.0–10.5)

## 2014-01-08 LAB — HEMOGLOBIN A1C
HEMOGLOBIN A1C: 6.3 % — AB (ref <5.7)
Mean Plasma Glucose: 134 mg/dL — ABNORMAL HIGH (ref <117)

## 2014-01-08 LAB — BASIC METABOLIC PANEL
BUN: 14 mg/dL (ref 6–23)
CALCIUM: 9.2 mg/dL (ref 8.4–10.5)
CO2: 26 meq/L (ref 19–32)
Chloride: 102 mEq/L (ref 96–112)
Creat: 1.05 mg/dL (ref 0.50–1.35)
Glucose, Bld: 80 mg/dL (ref 70–99)
POTASSIUM: 4.4 meq/L (ref 3.5–5.3)
SODIUM: 140 meq/L (ref 135–145)

## 2014-01-08 LAB — LIPID PANEL
CHOL/HDL RATIO: 3.1 ratio
CHOLESTEROL: 160 mg/dL (ref 0–200)
HDL: 51 mg/dL (ref >39)
LDL Cholesterol: 101 mg/dL — ABNORMAL HIGH (ref 0–99)
Triglycerides: 41 mg/dL (ref <150)
VLDL: 8 mg/dL (ref 0–40)

## 2014-01-12 ENCOUNTER — Other Ambulatory Visit: Payer: Self-pay | Admitting: Family Medicine

## 2014-01-26 ENCOUNTER — Encounter: Payer: Self-pay | Admitting: Family Medicine

## 2014-01-26 ENCOUNTER — Ambulatory Visit (INDEPENDENT_AMBULATORY_CARE_PROVIDER_SITE_OTHER): Payer: BC Managed Care – PPO | Admitting: Family Medicine

## 2014-01-26 VITALS — BP 110/80 | HR 80 | Resp 18 | Ht 72.5 in | Wt 184.0 lb

## 2014-01-26 DIAGNOSIS — R7309 Other abnormal glucose: Secondary | ICD-10-CM

## 2014-01-26 DIAGNOSIS — L209 Atopic dermatitis, unspecified: Secondary | ICD-10-CM

## 2014-01-26 DIAGNOSIS — J309 Allergic rhinitis, unspecified: Secondary | ICD-10-CM

## 2014-01-26 DIAGNOSIS — L2089 Other atopic dermatitis: Secondary | ICD-10-CM

## 2014-01-26 DIAGNOSIS — J3089 Other allergic rhinitis: Secondary | ICD-10-CM

## 2014-01-26 DIAGNOSIS — R7302 Impaired glucose tolerance (oral): Secondary | ICD-10-CM

## 2014-01-26 NOTE — Patient Instructions (Addendum)
F/u in 6 month, call if you need me before  Please be mindful of your carbohydrate intake,   Keep active at least 30 mins 5 days per week  hBA1C non fasting is oK in 6 month

## 2014-01-28 NOTE — Assessment & Plan Note (Signed)
No recent or current flare 

## 2014-01-28 NOTE — Progress Notes (Signed)
   Subjective:    Patient ID: Gabriel [name], male    DOB: 07/24/1975, 38 y.o.   MRN: [id number]  HPI The PT is here for follow up and re-evaluation of chronic medical conditions, medication management and review of any available recent lab and radiology data.  Preventive health is updated, specifically  Cancer screening and Immunization.  Refuses fliu vaccine today There are no new concerns.  There are no specific complaints       Review of Systems See HPI Denies recent fever or chills. Denies sinus pressure, nasal congestion, ear pain or sore throat. Denies chest congestion, productive cough or wheezing. Denies chest pains, palpitations and leg swelling Denies abdominal pain, nausea, vomiting,diarrhea or constipation.   Denies dysuria, frequency, hesitancy or incontinence. Denies joint pain, swelling and limitation in mobility. Denies headaches, seizures, numbness, or tingling. Denies depression, anxiety or insomnia. Denies skin break down or rash.        Objective:   Physical Exam BP 110/80  Pulse 80  Resp 18  Ht 6' 0.5" (1.842 m)  Wt 184 lb 0.6 oz (83.48 kg)  BMI 24.60 kg/m2  SpO2 98% Patient alert and oriented and in no cardiopulmonary distress.  HEENT: No facial asymmetry, EOMI,   oropharynx pink and moist.  Neck supple no JVD, no mass.  Chest: Clear to auscultation bilaterally.  CVS: S1, S2 no murmurs, no S3.Regular rate.  ABD: Soft non tender.   Ext: No edema  MS: Adequate ROM spine, shoulders, hips and knees.  Skin: Intact, no ulcerations or rash noted.  Psych: Good eye contact, normal affect. Memory intact not anxious or depressed appearing.  CNS: CN 2-12 intact, power,  normal throughout.no focal deficits noted.        Assessment & Plan:  IGT (impaired glucose tolerance) Deteriorated. Patient educated about the importance of limiting  Carbohydrate intake , the need to commit to daily physical activity for a minimum of 30 minutes , and to  commit weight loss. The fact that changes in all these areas will reduce or eliminate all together the development of diabetes is stressed.     Perennial allergic rhinitis Controlled, no change in medication   Atopic eczema No recent or current flare

## 2014-01-28 NOTE — Assessment & Plan Note (Signed)
Controlled, no change in medication  

## 2014-01-28 NOTE — Assessment & Plan Note (Signed)
Deteriorated Patient educated about the importance of limiting  Carbohydrate intake , the need to commit to daily physical activity for a minimum of 30 minutes , and to commit weight loss. The fact that changes in all these areas will reduce or eliminate all together the development of diabetes is stressed.    

## 2014-04-10 ENCOUNTER — Ambulatory Visit (INDEPENDENT_AMBULATORY_CARE_PROVIDER_SITE_OTHER): Payer: BC Managed Care – PPO | Admitting: Family Medicine

## 2014-04-10 ENCOUNTER — Ambulatory Visit (INDEPENDENT_AMBULATORY_CARE_PROVIDER_SITE_OTHER): Payer: BC Managed Care – PPO

## 2014-04-10 DIAGNOSIS — Z008 Encounter for other general examination: Secondary | ICD-10-CM

## 2014-04-10 DIAGNOSIS — S40022A Contusion of left upper arm, initial encounter: Secondary | ICD-10-CM

## 2014-04-10 DIAGNOSIS — S20212A Contusion of left front wall of thorax, initial encounter: Secondary | ICD-10-CM

## 2014-04-10 MED ORDER — MELOXICAM 7.5 MG PO TABS: 7.5000 mg | ORAL_TABLET | Freq: Every day | ORAL | Status: DC

## 2014-04-10 NOTE — Patient Instructions (Signed)
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
## 2014-04-11 ENCOUNTER — Encounter: Payer: Self-pay | Admitting: Family Medicine

## 2014-04-11 ENCOUNTER — Ambulatory Visit (INDEPENDENT_AMBULATORY_CARE_PROVIDER_SITE_OTHER): Payer: BC Managed Care – PPO | Admitting: Family Medicine

## 2014-04-11 DIAGNOSIS — R7301 Impaired fasting glucose: Secondary | ICD-10-CM

## 2014-04-11 DIAGNOSIS — M546 Pain in thoracic spine: Secondary | ICD-10-CM

## 2014-04-11 DIAGNOSIS — Z139 Encounter for screening, unspecified: Secondary | ICD-10-CM

## 2014-04-11 MED ORDER — KETOROLAC TROMETHAMINE 60 MG/2ML IM SOLN
60.0000 mg | Freq: Once | INTRAMUSCULAR | Status: AC
  Administered 2014-04-11: 60 mg via INTRAMUSCULAR
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
## 2014-04-11 NOTE — Patient Instructions (Addendum)
F/u first week in January  You are referred for ortho eval asap, we will call Toradol in office today for pain  If needed, we will prescribe hydrocodone which you can collect later, as we dioscussed  Fasting lipid, chem 7, HBa1C, TSH in January

## 2014-04-25 ENCOUNTER — Telehealth: Payer: Self-pay

## 2014-04-25 NOTE — Telephone Encounter (Signed)
Durward MallardCamille called and said that Dr Eulah PontMurphy had filled out an [facility] [employer] and she just got the papers back and they have him going back on Monday! She is furious. She said Dr Eulah PontMurphy still has him in therapy for 2 more weeks and there is no way he can return to his strenuous job on Monday. I asked had she talked with them but she said she cannot get "nowhere with that office" She said if something doesn't get done by Friday he will lose his job. Call back number [phone]

## 2014-04-25 NOTE — Telephone Encounter (Signed)
pls explain that any extension of work etc needs to be from the Doc who is treeating him for the [facility], if telephone is not working I recommend they go as a work in to Dr [employer] McLenn[employer]Murphys office, they have an "urgent care / work in if tele call not working so Doc treating him can understand the seriousness of the problem

## 2014-04-25 NOTE — Telephone Encounter (Signed)
Dr Lodema Hongsimpson said murphy to address

## 2014-05-14 ENCOUNTER — Other Ambulatory Visit: Payer: Self-pay | Admitting: Family Medicine

## 2014-10-20 ENCOUNTER — Telehealth: Payer: Self-pay

## 2014-10-20 DIAGNOSIS — Z139 Encounter for screening, unspecified: Secondary | ICD-10-CM

## 2014-10-20 DIAGNOSIS — R7302 Impaired glucose tolerance (oral): Secondary | ICD-10-CM

## 2014-10-20 NOTE — Telephone Encounter (Signed)
Labs ordered and mailed to home address.  

## 2014-10-20 NOTE — Telephone Encounter (Signed)
-----   Message from Kerri Perches, MD sent at 10/13/2014  7:17 PM EDT ----- Regarding: f/u Missed updated lab, no appt on record. Pls contact, he needs in mid August fasting lipid cbc, HBa1C and chem 7  Also arrange for preventive visit by end august for general exam and lab f/u if he agrees  pls

## 2014-11-09 ENCOUNTER — Other Ambulatory Visit: Payer: Self-pay

## 2014-11-09 MED ORDER — TRIAMCINOLONE ACETONIDE 0.025 % EX OINT: TOPICAL_OINTMENT | CUTANEOUS | Status: DC

## 2015-03-30 LAB — BASIC METABOLIC PANEL
BUN: 14 mg/dL (ref 7–25)
CO2: 34 mmol/L — AB (ref 20–31)
Calcium: 9.3 mg/dL (ref 8.6–10.3)
Chloride: 101 mmol/L (ref 98–110)
Creat: 0.97 mg/dL (ref 0.60–1.35)
Glucose, Bld: 94 mg/dL (ref 65–99)
Potassium: 4.7 mmol/L (ref 3.5–5.3)
SODIUM: 141 mmol/L (ref 135–146)

## 2015-03-30 LAB — HEMOGLOBIN A1C
Hgb A1c MFr Bld: 6.4 % — ABNORMAL HIGH (ref <5.7)
Mean Plasma Glucose: 137 mg/dL — ABNORMAL HIGH (ref <117)

## 2015-03-30 LAB — LIPID PANEL
Cholesterol: 150 mg/dL (ref 125–200)
HDL: 46 mg/dL (ref >=40)
LDL CALC: 93 mg/dL (ref <130)
Total CHOL/HDL Ratio: 3.3 Ratio (ref <=5.0)
Triglycerides: 56 mg/dL (ref <150)
VLDL: 11 mg/dL (ref <30)

## 2015-03-30 LAB — CBC
HCT: 43.3 % (ref 39.0–52.0)
Hemoglobin: 14.6 g/dL (ref 13.0–17.0)
MCH: 29.1 pg (ref 26.0–34.0)
MCHC: 33.7 g/dL (ref 30.0–36.0)
MCV: 86.4 fL (ref 78.0–100.0)
MPV: 8.7 fL (ref 8.6–12.4)
PLATELETS: 316 10*3/uL (ref 150–400)
RBC: 5.01 MIL/uL (ref 4.22–5.81)
RDW: 13.8 % (ref 11.5–15.5)
WBC: 5.1 10*3/uL (ref 4.0–10.5)

## 2015-04-28 ENCOUNTER — Other Ambulatory Visit: Payer: Self-pay | Admitting: Family Medicine

## 2015-09-13 ENCOUNTER — Telehealth: Payer: Self-pay

## 2015-09-13 DIAGNOSIS — R7302 Impaired glucose tolerance (oral): Secondary | ICD-10-CM

## 2015-09-13 DIAGNOSIS — R7303 Prediabetes: Secondary | ICD-10-CM

## 2015-09-13 LAB — BASIC METABOLIC PANEL
BUN: 15 mg/dL (ref 7–25)
CALCIUM: 9.6 mg/dL (ref 8.6–10.3)
CO2: 31 mmol/L (ref 20–31)
Chloride: 100 mmol/L (ref 98–110)
Creat: 1.06 mg/dL (ref 0.60–1.35)
Glucose, Bld: 98 mg/dL (ref 65–99)
POTASSIUM: 3.7 mmol/L (ref 3.5–5.3)
Sodium: 140 mmol/L (ref 135–146)

## 2015-09-13 LAB — HEMOGLOBIN A1C
HEMOGLOBIN A1C: 6.2 % — AB (ref <5.7)
Mean Plasma Glucose: 131 mg/dL

## 2015-09-13 NOTE — Telephone Encounter (Signed)
Labs ordered and given to patient to have drawn

## 2015-09-14 ENCOUNTER — Other Ambulatory Visit: Payer: Self-pay | Admitting: Family Medicine

## 2015-09-14 ENCOUNTER — Ambulatory Visit (HOSPITAL_COMMUNITY)
Admission: RE | Admit: 2015-09-14 | Discharge: 2015-09-14 | Disposition: A | Discharge status: Home / Self Care | Payer: BLUE CROSS/BLUE SHIELD | Source: Ambulatory Visit | Attending: Family Medicine | Admitting: Family Medicine

## 2015-09-14 ENCOUNTER — Encounter: Payer: Self-pay | Admitting: Family Medicine

## 2015-09-14 ENCOUNTER — Ambulatory Visit (INDEPENDENT_AMBULATORY_CARE_PROVIDER_SITE_OTHER): Payer: BLUE CROSS/BLUE SHIELD | Admitting: Family Medicine

## 2015-09-14 VITALS — BP 122/70 | HR 76 | Resp 18 | Ht 72.05 in | Wt 194.0 lb

## 2015-09-14 DIAGNOSIS — J029 Acute pharyngitis, unspecified: Secondary | ICD-10-CM | POA: Diagnosis not present

## 2015-09-14 DIAGNOSIS — J309 Allergic rhinitis, unspecified: Secondary | ICD-10-CM

## 2015-09-14 DIAGNOSIS — Z125 Encounter for screening for malignant neoplasm of prostate: Secondary | ICD-10-CM

## 2015-09-14 DIAGNOSIS — R0789 Other chest pain: Secondary | ICD-10-CM

## 2015-09-14 DIAGNOSIS — L209 Atopic dermatitis, unspecified: Secondary | ICD-10-CM

## 2015-09-14 DIAGNOSIS — R7302 Impaired glucose tolerance (oral): Secondary | ICD-10-CM

## 2015-09-14 DIAGNOSIS — Z114 Encounter for screening for human immunodeficiency virus [HIV]: Secondary | ICD-10-CM | POA: Diagnosis not present

## 2015-09-14 DIAGNOSIS — R937 Abnormal findings on diagnostic imaging of other parts of musculoskeletal system: Secondary | ICD-10-CM | POA: Insufficient documentation

## 2015-09-14 DIAGNOSIS — R079 Chest pain, unspecified: Secondary | ICD-10-CM | POA: Diagnosis present

## 2015-09-14 DIAGNOSIS — J3089 Other allergic rhinitis: Secondary | ICD-10-CM

## 2015-09-14 LAB — POCT RAPID STREP A (OFFICE): RAPID STREP A SCREEN: NEGATIVE

## 2015-09-14 MED ORDER — TRIAMCINOLONE ACETONIDE 0.025 % EX OINT: TOPICAL_OINTMENT | CUTANEOUS | Status: DC

## 2015-09-14 MED ORDER — IBUPROFEN 600 MG PO TABS: 600.0000 mg | ORAL_TABLET | Freq: Two times a day (BID) | ORAL | Status: DC

## 2015-09-14 MED ORDER — PREDNISONE 5 MG (21) PO TBPK: ORAL_TABLET | ORAL | Status: DC

## 2015-09-14 MED ORDER — RANITIDINE HCL 300 MG PO TABS: 300.0000 mg | ORAL_TABLET | Freq: Every day | ORAL | Status: DC

## 2015-09-14 MED ORDER — AZELASTINE HCL 0.1 % NA SOLN: 2.0000 | Freq: Two times a day (BID) | NASAL | Status: DC

## 2015-09-14 NOTE — Patient Instructions (Addendum)
Physical eexam 11/20 or after , call if you need me sooner  Improved blood sugar , great , keep working on this,  Weight loss goal of 10 pounds  It is important that you exercise regularly at least 30 minutes 5 times a week. If you develop chest pain, have severe difficulty breathing, or feel very tired, stop exercising immediately and seek medical attention   Happy 40!  Symptoms are from uncontrolled allergies, commit to daily asttellin , and [facility]) take one daily  Sudafed one daily for 3 to 5 days will also help to reduce the drainage quickly  Robitussin DM 1 to 2 teaspoon at night will help to suppress cough if excessive  Call in nexxt 1 week if get fever, chills , facial pressure, signs of infection  Central chest pain is due to inflammtion of lower breast bone (sternum) 1 week course of 3 meds sent  in, pls also get CXR today  Thank you  for choosing [facility]. We consider it a privelige to serve you.  Delivering excellent health care in a caring and  compassionate way is our goal.  Partnering with you,  so that together we can achieve this goal is our strategy.   Please work on good  health habits so that your health will improve. 1. Commitment to daily physical activity for 30 to 60  minutes, if you are able to do this.  2. Commitment to wise food choices. Aim for half of your  food intake to be vegetable and fruit, one quarter starchy foods, and one quarter protein. Try to eat on a regular schedule  3 meals per day, snacking between meals should be limited to vegetables or fruits or small portions of nuts. 64 ounces of water per day is generally recommended, unless you have specific health conditions, like heart failure or kidney failure where you will need to limit fluid intake.  3. Commitment to sufficient and a  good quality of physical and mental rest daily, generally between 6 to 8 hours per day.  WITH  PERSISTANCE AND PERSEVERANCE, THE IMPOSSIBLE , BECOMES THE NORM!
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
## 2015-09-15 ENCOUNTER — Other Ambulatory Visit: Payer: Self-pay | Admitting: Family Medicine

## 2015-09-15 DIAGNOSIS — R937 Abnormal findings on diagnostic imaging of other parts of musculoskeletal system: Secondary | ICD-10-CM

## 2015-09-15 DIAGNOSIS — R0789 Other chest pain: Secondary | ICD-10-CM

## 2015-09-18 NOTE — Assessment & Plan Note (Signed)
Patient educated about the importance of limiting  Carbohydrate intake , the need to commit to daily physical activity for a minimum of 30 minutes , and to commit weight loss. The fact that changes in all these areas will reduce or eliminate all together the development of diabetes is stressed.  Improved, pt applauded on this, encouraged to continue same  Diabetic Labs Latest Ref Rng 09/13/2015 03/29/2015 01/08/2014 10/06/2013 08/26/2013  HbA1c <5.7 % 6.2(H) 6.4(H) 6.3(H) - 6.1(H)  Chol 125 - 200 mg/dL - 657150 846160 - -  HDL >=96>=40 mg/dL - 46 51 - -  Calc LDL <295<130 mg/dL - 93 284(X101(H) - -  Triglycerides <150 mg/dL - 56 41 - -  Creatinine 0.60 - 1.35 mg/dL 3.241.06 4.010.97 0.271.05 2.531.12 6.640.98   BP/Weight 09/14/2015 04/11/2014 04/10/2014 01/26/2014 10/07/2013 06/03/2013 06/03/2013  Systolic BP 122 106 119 110 112 - 112  Diastolic BP 70 68 68 80 74 - 78  Wt. (Lbs) 194 183 184 184.04 179.8 188 190.8  BMI 26.28 24.46 24.6 24.6 24.04 25.13 25.51   No flowsheet data found.

## 2015-09-18 NOTE — Assessment & Plan Note (Signed)
Uncontrolled, pred dose pack, and commitment to daily meds and saline nasal spray,  No evidence of bacterial infection, pt to call in next 1 week if this develops

## 2015-09-18 NOTE — Assessment & Plan Note (Signed)
Mild flare currently, topical steroid prescribed , responds to this
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
## 2015-09-18 NOTE — Assessment & Plan Note (Signed)
Normal exam, c/o "scratchy throat. Rapid strep neggative

## 2015-09-21 ENCOUNTER — Ambulatory Visit (HOSPITAL_COMMUNITY)
Admission: RE | Admit: 2015-09-21 | Discharge: 2015-09-21 | Disposition: A | Discharge status: Home / Self Care | Payer: BLUE CROSS/BLUE SHIELD | Source: Ambulatory Visit | Attending: Family Medicine | Admitting: Family Medicine

## 2015-09-21 ENCOUNTER — Encounter: Payer: Self-pay | Admitting: Family Medicine

## 2015-09-21 DIAGNOSIS — R079 Chest pain, unspecified: Secondary | ICD-10-CM | POA: Insufficient documentation

## 2015-09-21 DIAGNOSIS — R0789 Other chest pain: Secondary | ICD-10-CM

## 2015-09-21 DIAGNOSIS — R937 Abnormal findings on diagnostic imaging of other parts of musculoskeletal system: Secondary | ICD-10-CM | POA: Diagnosis not present

## 2015-09-22 ENCOUNTER — Encounter: Payer: Self-pay | Admitting: Family Medicine

## 2015-09-22 ENCOUNTER — Telehealth: Payer: Self-pay | Admitting: Family Medicine

## 2015-09-22 NOTE — Telephone Encounter (Signed)
Pls directly contact pt on my behalf and review my response to his question about the scan and his limitation as far as work is concerned due to pain, thanks

## 2015-09-22 NOTE — Telephone Encounter (Signed)
Responded via mychart

## 2015-09-22 NOTE — Telephone Encounter (Signed)
Patient received your results on Mychart and sent back a message this am.

## 2015-09-22 NOTE — Telephone Encounter (Signed)
pls call pt/ spouse let them know CT scan shows no abnormality, which is great!  No mass in area of concern  I just tried to call, phone busy.  I have sent e mail.  I received a msg yesterday that they were anxious and wanted to be contacted

## 2016-01-10 ENCOUNTER — Other Ambulatory Visit: Payer: Self-pay | Admitting: Family Medicine

## 2016-02-21 ENCOUNTER — Ambulatory Visit (INDEPENDENT_AMBULATORY_CARE_PROVIDER_SITE_OTHER): Payer: BLUE CROSS/BLUE SHIELD | Admitting: Family Medicine

## 2016-02-21 ENCOUNTER — Emergency Department (HOSPITAL_COMMUNITY): Payer: Worker's Compensation

## 2016-02-21 ENCOUNTER — Encounter: Payer: Self-pay | Admitting: Family Medicine

## 2016-02-21 ENCOUNTER — Emergency Department (HOSPITAL_COMMUNITY)
Admission: EM | Admit: 2016-02-21 | Discharge: 2016-02-21 | Disposition: A | Discharge status: Home / Self Care | Payer: Worker's Compensation | Attending: Emergency Medicine | Admitting: Emergency Medicine

## 2016-02-21 ENCOUNTER — Encounter (HOSPITAL_COMMUNITY): Payer: Self-pay | Admitting: Emergency Medicine

## 2016-02-21 VITALS — BP 116/72 | HR 54 | Ht 72.0 in | Wt 196.1 lb

## 2016-02-21 DIAGNOSIS — Y9389 Activity, other specified: Secondary | ICD-10-CM | POA: Insufficient documentation

## 2016-02-21 DIAGNOSIS — Y990 Civilian activity done for income or pay: Secondary | ICD-10-CM | POA: Diagnosis not present

## 2016-02-21 DIAGNOSIS — S161XXA Strain of muscle, fascia and tendon at neck level, initial encounter: Secondary | ICD-10-CM | POA: Insufficient documentation

## 2016-02-21 DIAGNOSIS — R202 Paresthesia of skin: Secondary | ICD-10-CM

## 2016-02-21 DIAGNOSIS — R200 Anesthesia of skin: Secondary | ICD-10-CM | POA: Diagnosis not present

## 2016-02-21 DIAGNOSIS — Z09 Encounter for follow-up examination after completed treatment for conditions other than malignant neoplasm: Secondary | ICD-10-CM | POA: Diagnosis not present

## 2016-02-21 DIAGNOSIS — M62838 Other muscle spasm: Secondary | ICD-10-CM

## 2016-02-21 DIAGNOSIS — S199XXA Unspecified injury of neck, initial encounter: Secondary | ICD-10-CM | POA: Diagnosis present

## 2016-02-21 DIAGNOSIS — M5413 Radiculopathy, cervicothoracic region: Secondary | ICD-10-CM

## 2016-02-21 DIAGNOSIS — Z0000 Encounter for general adult medical examination without abnormal findings: Secondary | ICD-10-CM

## 2016-02-21 DIAGNOSIS — Y929 Unspecified place or not applicable: Secondary | ICD-10-CM | POA: Insufficient documentation

## 2016-02-21 DIAGNOSIS — Z79899 Other long term (current) drug therapy: Secondary | ICD-10-CM | POA: Diagnosis not present

## 2016-02-21 DIAGNOSIS — X500XXA Overexertion from strenuous movement or load, initial encounter: Secondary | ICD-10-CM | POA: Diagnosis not present

## 2016-02-21 DIAGNOSIS — T148XXA Other injury of unspecified body region, initial encounter: Secondary | ICD-10-CM

## 2016-02-21 HISTORY — DX: Cardiac murmur, unspecified: R01.1

## 2016-02-21 MED ORDER — METHYLPREDNISOLONE ACETATE 80 MG/ML IJ SUSP
80.0000 mg | Freq: Once | INTRAMUSCULAR | Status: AC
  Administered 2016-02-21: 80 mg via INTRAMUSCULAR

## 2016-02-21 MED ORDER — RANITIDINE HCL 300 MG PO TABS: 300.0000 mg | ORAL_TABLET | Freq: Every day | ORAL | 0 refills | Status: DC

## 2016-02-21 MED ORDER — PREDNISONE 10 MG PO TABS: ORAL_TABLET | ORAL | 0 refills | Status: DC

## 2016-02-21 MED ORDER — CYCLOBENZAPRINE HCL 10 MG PO TABS: 10.0000 mg | ORAL_TABLET | Freq: Three times a day (TID) | ORAL | 0 refills | Status: DC | PRN

## 2016-02-21 MED ORDER — IBUPROFEN 800 MG PO TABS
800.0000 mg | ORAL_TABLET | Freq: Once | ORAL | Status: AC
  Administered 2016-02-21: 800 mg via ORAL
  Filled 2016-02-21: qty 1

## 2016-02-21 MED ORDER — OXYCODONE-ACETAMINOPHEN 5-325 MG PO TABS: 1.0000 | ORAL_TABLET | ORAL | 0 refills | Status: DC | PRN

## 2016-02-21 MED ORDER — OXYCODONE-ACETAMINOPHEN 5-325 MG PO TABS
1.0000 | ORAL_TABLET | Freq: Once | ORAL | Status: AC
  Administered 2016-02-21: 1 via ORAL
  Filled 2016-02-21: qty 1

## 2016-02-21 MED ORDER — KETOROLAC TROMETHAMINE 60 MG/2ML IM SOLN
60.0000 mg | Freq: Once | INTRAMUSCULAR | Status: AC
  Administered 2016-02-21: 60 mg via INTRAMUSCULAR
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
## 2016-02-21 NOTE — Patient Instructions (Addendum)
Physical as before, call if you need me sooner  You are treated for acute neck  Pain and spasm following injury yesterday  toradol and depo medrol in office iM. Take prednisone and naproxen as prescribed , also flexeril muscle relaxer. Percocet , only if needed at night when you state pain is worse lying down  I anticipate that you will be siginificantly better in next 1 to 2 weeks  Zantac one daily sent to protect stomach from heartburn  Keep seeing workman's comp Doc  You are also referred to orthopedic office for f/u  Work excuse for 1 week
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
## 2016-02-21 NOTE — Assessment & Plan Note (Signed)
No changes in management after record review, pt with neck spasm and upper extremity radicular symptoms following jerking of upper  body on the job one day ago Work excuse x 1 week and refer to ortho
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
## 2016-02-21 NOTE — Discharge Instructions (Signed)
Continue to apply ice packs on/off to your elbow and shoulder blade area.  Follow-up with your primary doctor for recheck in a few days if not improving
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
## 2016-02-21 NOTE — Assessment & Plan Note (Signed)
Acute injury 1 day ago, with residual left trapezius spasm, muscle relaxant prescribed , and referred to ortho

## 2016-04-11 ENCOUNTER — Encounter: Payer: BLUE CROSS/BLUE SHIELD | Admitting: Family Medicine

## 2016-05-13 ENCOUNTER — Encounter: Payer: Self-pay | Admitting: Family Medicine

## 2016-05-13 DIAGNOSIS — G5600 Carpal tunnel syndrome, unspecified upper limb: Secondary | ICD-10-CM

## 2016-05-13 HISTORY — DX: Carpal tunnel syndrome, unspecified upper limb: G56.00

## 2016-05-14 ENCOUNTER — Other Ambulatory Visit: Payer: Self-pay

## 2016-05-14 MED ORDER — TRIAMCINOLONE ACETONIDE 0.025 % EX OINT: 1.0000 "application " | TOPICAL_OINTMENT | CUTANEOUS | 1 refills | Status: DC | PRN

## 2016-07-06 ENCOUNTER — Other Ambulatory Visit: Payer: Self-pay | Admitting: Family Medicine

## 2016-10-04 ENCOUNTER — Other Ambulatory Visit: Payer: Self-pay | Admitting: Family Medicine

## 2016-12-04 ENCOUNTER — Other Ambulatory Visit: Payer: Self-pay | Admitting: Family Medicine

## 2016-12-04 DIAGNOSIS — Z0000 Encounter for general adult medical examination without abnormal findings: Secondary | ICD-10-CM

## 2016-12-04 LAB — LIPID PANEL
CHOL/HDL RATIO: 2.9 ratio (ref <5.0)
CHOLESTEROL: 164 mg/dL (ref <200)
HDL: 56 mg/dL (ref >40)
LDL Cholesterol: 98 mg/dL (ref <100)
Triglycerides: 49 mg/dL (ref <150)
VLDL: 10 mg/dL (ref <30)

## 2016-12-04 LAB — BASIC METABOLIC PANEL
BUN: 15 mg/dL (ref 7–25)
CHLORIDE: 103 mmol/L (ref 98–110)
CO2: 25 mmol/L (ref 20–31)
CREATININE: 1 mg/dL (ref 0.60–1.35)
Calcium: 9.2 mg/dL (ref 8.6–10.3)
GLUCOSE: 96 mg/dL (ref 65–99)
POTASSIUM: 4.1 mmol/L (ref 3.5–5.3)
Sodium: 141 mmol/L (ref 135–146)

## 2016-12-04 LAB — CBC
HCT: 44.4 % (ref 38.5–50.0)
HEMOGLOBIN: 14.7 g/dL (ref 13.2–17.1)
MCH: 29.2 pg (ref 27.0–33.0)
MCHC: 33.1 g/dL (ref 32.0–36.0)
MCV: 88.1 fL (ref 80.0–100.0)
MPV: 8.8 fL (ref 7.5–12.5)
PLATELETS: 361 10*3/uL (ref 140–400)
RBC: 5.04 MIL/uL (ref 4.20–5.80)
RDW: 13.4 % (ref 11.0–15.0)
WBC: 4.8 10*3/uL (ref 3.8–10.8)

## 2016-12-04 LAB — TSH: TSH: 1.67 m[IU]/L (ref 0.40–4.50)

## 2016-12-04 NOTE — Addendum Note (Signed)
Addended by: Crawford GivensPUTMAN, SELENA M on: 12/04/2016 10:07 AM   Modules accepted: Orders

## 2016-12-05 LAB — PSA: PSA: 0.3 ng/mL (ref <=4.0)

## 2016-12-05 LAB — HEMOGLOBIN A1C
Hgb A1c MFr Bld: 5.9 % — ABNORMAL HIGH (ref <5.7)
Mean Plasma Glucose: 123 mg/dL

## 2016-12-05 LAB — HIV ANTIBODY (ROUTINE TESTING W REFLEX): HIV 1&2 Ab, 4th Generation: NONREACTIVE

## 2016-12-25 ENCOUNTER — Ambulatory Visit: Payer: Self-pay | Admitting: Family Medicine

## 2017-01-22 ENCOUNTER — Ambulatory Visit: Payer: Self-pay | Admitting: Family Medicine

## 2017-01-31 ENCOUNTER — Other Ambulatory Visit: Payer: Self-pay | Admitting: Family Medicine

## 2017-02-19 ENCOUNTER — Encounter: Payer: Self-pay | Admitting: Family Medicine

## 2017-02-19 ENCOUNTER — Ambulatory Visit (INDEPENDENT_AMBULATORY_CARE_PROVIDER_SITE_OTHER): Payer: BLUE CROSS/BLUE SHIELD | Admitting: Family Medicine

## 2017-02-19 VITALS — BP 120/68 | HR 82 | Temp 97.1°F | Resp 16 | Ht 72.0 in | Wt 199.2 lb

## 2017-02-19 DIAGNOSIS — Z1211 Encounter for screening for malignant neoplasm of colon: Secondary | ICD-10-CM | POA: Diagnosis not present

## 2017-02-19 DIAGNOSIS — Z0000 Encounter for general adult medical examination without abnormal findings: Secondary | ICD-10-CM | POA: Diagnosis not present

## 2017-02-19 LAB — HEMOCCULT GUIAC POC 1CARD (OFFICE): Fecal Occult Blood, POC: NEGATIVE

## 2017-02-19 MED ORDER — MOMETASONE FUROATE 50 MCG/ACT NA SUSP: 2.0000 | Freq: Every day | NASAL | 12 refills | Status: DC

## 2017-02-19 NOTE — Patient Instructions (Addendum)
Preventive exam onoOCTOBER 11, 2019 OR AFTER, CALL IF YOU NEED ME SOONER  CCNGARTS ON IMPROVED BLOOD, aim for NORMAL next year!  Hopefully you will be 6 pounds less also  Please get flu vacccine   Send for med refil when need   Pls call next September for entire lab panel to be ordered for yopur next visit  All the best  It is important that you exercise regularly at least 30 minutes 5 times a week. If you develop chest pain, have severe difficulty breathing, or feel very tired, stop exercising immediately and seek medical attention   Thank you  for choosing [facility]. We consider it a privelige to serve you.  Delivering excellent health care in a caring and  compassionate way is our goal.  Partnering with you,  so that together we can achieve this goal is our strategy.

## 2017-02-19 NOTE — Progress Notes (Signed)
   Gabriel [name]     MRN: [id number]      DOB: 03-30-1976   HPI: Patient is in for annual physical exam. No other health concerns are expressed or addressed at the visit. Recent labs, are reviewed. Immunization is reviewed , will get flu vaccine at job   PE; BP 120/68 (BP Location: Left Arm, Patient Position: Sitting, Cuff Size: Normal)   Pulse 82   Temp (!) 97.1 F (36.2 C) (Other (Comment))   Resp 16   Ht 6' (1.829 m)   Wt 199 lb 4 oz (90.4 kg)   SpO2 98%   BMI 27.02 kg/m  Pleasant male, alert and oriented x 3, in no cardio-pulmonary distress. Afebrile. HEENT No facial trauma or asymetry. Sinuses non tender. EOMI, pupils equally reactive to light. External ears normal, tympanic membranes clear. Oropharynx moist, no exudate. Neck: supple, no adenopathy,JVD or thyromegaly.No bruits.  Chest: Clear to ascultation bilaterally.No crackles or wheezes. Non tender to palpation  Breast: No asymetry,no masses. No nipple discharge or inversion. No axillary or supraclavicular adenopathy  Cardiovascular system; Heart sounds normal,  S1 and  S2 ,no S3.  No murmur, or thrill. Apical beat not displaced Peripheral pulses normal.  Abdomen: Soft, non tender, no organomegaly or masses. No bruits. Bowel sounds normal. No guarding, tenderness or rebound.  Rectal:  Normal sphincter tone. No hemorrhoids or  masses. guaiac negative stool. Prostate smooth and firm    Musculoskeletal exam: Full ROM of spine, hips , shoulders and knees. No deformity ,swelling or crepitus noted. No muscle wasting or atrophy.   Neurologic: Cranial nerves 2 to 12 intact. Power, tone ,sensation and reflexes normal throughout. No disturbance in gait. No tremor.  Skin:or Intact, no ulceration, erythema , or scaling noted Hypo Pigmented macular lesions on back , annular Psych; Normal mood and affect. Judgement and concentration normal   Assessment & Plan:  Annual physical exam Annual exam as  documented. Counseling done  re healthy lifestyle involving commitment to 150 minutes exercise per week, heart healthy diet, and attaining healthy weight.The importance of adequate sleep also discussed. Changes in health habits are decided on by the patient with goals and time frames  set for achieving them. Immunization and cancer screening needs are specifically addressed at this visit.

## 2017-02-19 NOTE — Assessment & Plan Note (Signed)
Annual exam as documented. Counseling done  re healthy lifestyle involving commitment to 150 minutes exercise per week, heart healthy diet, and attaining healthy weight.The importance of adequate sleep also discussed. Changes in health habits are decided on by the patient with goals and time frames  set for achieving them. Immunization and cancer screening needs are specifically addressed at this visit. 

## 2017-03-02 ENCOUNTER — Other Ambulatory Visit: Payer: Self-pay | Admitting: Family Medicine

## 2017-03-03 NOTE — Progress Notes (Signed)
Order(s) created erroneously. Erroneous order ID: [id number]  Order moved by: Ian MalkinEVERHART, Donnavin Vandenbrink F  Order move date/time: 03/03/2017 2:23 PM  Source Patient: [id number]  Source Contact: 02/21/2016  Destination Patient: [id number]  Destination Contact: 12/04/2016  Erroneous order ID: [id number]  Order moved by: Ian MalkinEVERHART, Lula Michaux F  Order move date/time: 03/03/2017 2:23 PM  Source Patient: [id number]  Source Contact: 02/21/2016  Destination Patient: [id number]  Destination Contact: 12/04/2016  Erroneous order ID: [id number]  Order moved by: Ian MalkinEVERHART, Marcelina Mclaurin F  Order move date/time: 03/03/2017 2:23 PM  Source Patient: [id number]  Source Contact: 02/21/2016  Destination Patient: [id number]  Destination Contact: 12/04/2016  Erroneous order ID: [id number]  Order moved by: Ian MalkinEVERHART, Dayzee Trower F  Order move date/time: 03/03/2017 2:23 PM  Source Patient: [id number]  Source Contact: 02/21/2016  Destination Patient: [id number]  Destination Contact: 12/04/2016  Erroneous order ID: [id number]  Order moved by: Ian MalkinEVERHART, Lakendria Nicastro F  Order move date/time: 03/03/2017 2:23 PM  Source Patient: [id number]  Source Contact: 02/21/2016  Destination Patient: [id number]  Destination Contact: 12/04/2016  Erroneous order ID: [id number]  Order moved by: Ian MalkinEVERHART, Annalyse Langlais F  Order move date/time: 03/03/2017 2:23 PM  Source Patient: [id number]  Source Contact: 02/21/2016  Destination Patient: [id number]  Destination Contact: 12/04/2016  Erroneous order ID: [id number]  Order moved by: Ian MalkinEVERHART, Ivey Cina F  Order move date/time: 03/03/2017 2:23 PM  Source Patient: [id number]  Source Contact: 02/21/2016  Destination Patient: [id number]  Destination Contact: 12/04/2016

## 2017-03-03 NOTE — Progress Notes (Signed)
Order(s) created erroneously. Erroneous order ID: [id number]  Order moved by: Shalece Staffa F  Order move date/time: 03/03/2017 2:23 PM  Source Patient: [id number]  Source Contact: 02/21/2016  Destination Patient: [id number]  Destination Contact: 12/04/2016  Erroneous order ID: [id number]  Order moved by: Yezenia Fredrick F  Order move date/time: 03/03/2017 2:23 PM  Source Patient: [id number]  Source Contact: 02/21/2016  Destination Patient: [id number]  Destination Contact: 12/04/2016  Erroneous order ID: [id number]  Order moved by: Wendell Fiebig F  Order move date/time: 03/03/2017 2:23 PM  Source Patient: [id number]  Source Contact: 02/21/2016  Destination Patient: [id number]  Destination Contact: 12/04/2016  Erroneous order ID: [id number]  Order moved by: Marciel Offenberger F  Order move date/time: 03/03/2017 2:23 PM  Source Patient: [id number]  Source Contact: 02/21/2016  Destination Patient: [id number]  Destination Contact: 12/04/2016  Erroneous order ID: [id number]  Order moved by: Jama Krichbaum F  Order move date/time: 03/03/2017 2:23 PM  Source Patient: [id number]  Source Contact: 02/21/2016  Destination Patient: [id number]  Destination Contact: 12/04/2016  Erroneous order ID: [id number]  Order moved by: Lizania Bouchard F  Order move date/time: 03/03/2017 2:23 PM  Source Patient: [id number]  Source Contact: 02/21/2016  Destination Patient: [id number]  Destination Contact: 12/04/2016  Erroneous order ID: [id number]  Order moved by: Raja Caputi F  Order move date/time: 03/03/2017 2:23 PM  Source Patient: [id number]  Source Contact: 02/21/2016  Destination Patient: [id number]  Destination Contact: 12/04/2016 

## 2017-04-02 ENCOUNTER — Other Ambulatory Visit: Payer: Self-pay | Admitting: Family Medicine

## 2017-04-15 ENCOUNTER — Other Ambulatory Visit: Payer: Self-pay

## 2017-04-15 MED ORDER — TRIAMCINOLONE ACETONIDE 0.025 % EX OINT: TOPICAL_OINTMENT | CUTANEOUS | 0 refills | Status: DC

## 2017-07-08 ENCOUNTER — Telehealth: Payer: Self-pay

## 2017-07-08 NOTE — Telephone Encounter (Signed)
Noted , if feels needs to be seen mote urgently, since this is the soonest available, I recommend eD evaluatiuon, please let her know this

## 2017-07-08 NOTE — Telephone Encounter (Signed)
Durward MallardCamille called and wanted urgent appt for Gabriel [name] due to numbness and tingling in both arms. Worked in for Thursday. Wife stated she wanted a telephone message sent to you about this.

## 2017-07-10 ENCOUNTER — Encounter: Payer: Self-pay | Admitting: Family Medicine

## 2017-07-10 ENCOUNTER — Ambulatory Visit (HOSPITAL_COMMUNITY)
Admission: RE | Admit: 2017-07-10 | Discharge: 2017-07-10 | Disposition: A | Discharge status: Home / Self Care | Payer: BLUE CROSS/BLUE SHIELD | Source: Ambulatory Visit | Attending: Family Medicine | Admitting: Family Medicine

## 2017-07-10 ENCOUNTER — Ambulatory Visit (INDEPENDENT_AMBULATORY_CARE_PROVIDER_SITE_OTHER): Payer: BLUE CROSS/BLUE SHIELD | Admitting: Family Medicine

## 2017-07-10 VITALS — BP 114/82 | HR 59 | Resp 16 | Ht 72.0 in | Wt 204.0 lb

## 2017-07-10 DIAGNOSIS — R202 Paresthesia of skin: Secondary | ICD-10-CM

## 2017-07-10 DIAGNOSIS — M79643 Pain in unspecified hand: Secondary | ICD-10-CM

## 2017-07-10 DIAGNOSIS — G4701 Insomnia due to medical condition: Secondary | ICD-10-CM

## 2017-07-10 DIAGNOSIS — R200 Anesthesia of skin: Secondary | ICD-10-CM | POA: Diagnosis not present

## 2017-07-10 DIAGNOSIS — M542 Cervicalgia: Secondary | ICD-10-CM | POA: Insufficient documentation

## 2017-07-10 DIAGNOSIS — G8929 Other chronic pain: Secondary | ICD-10-CM

## 2017-07-10 MED ORDER — TRAMADOL HCL 50 MG PO TABS: ORAL_TABLET | ORAL | 2 refills | Status: DC

## 2017-07-10 MED ORDER — TEMAZEPAM 7.5 MG PO CAPS: 7.5000 mg | ORAL_CAPSULE | Freq: Every evening | ORAL | 0 refills | Status: DC | PRN

## 2017-07-10 NOTE — Assessment & Plan Note (Addendum)
3 month h/o bilateral hand pain left greater than right , rated at a 10 plus associated with weakness and educed sensation in left arm and forearm.  H/o braichioplexy from job related injury in 2017 S/s mild left carpal tunnel, refer to ortho for eval, dg C spine is normal Tramadol and tylenol for pain

## 2017-07-10 NOTE — Telephone Encounter (Signed)
Patient being seen in office

## 2017-07-10 NOTE — Patient Instructions (Addendum)
F/u  As before, call if you need me sooner.. tramadol 1 to 2 at bedtime for pain with tylenol 500 mg one tablet  For sleep if needed. Low dose Restoril is prescribed if needed  X ray of neck today, I will call with result and next step plan as we discussed  OTC sl-plint / brace for carpal tunnel to use on left hand will help pain at night  Gabriel [name]([name], [name])

## 2017-07-10 NOTE — Progress Notes (Signed)
   [name] Settingerrence Moccia     MRN: [id number]      DOB: 12/24/1975   HPI Mr. Karl ItoMcCollum is here c/o 3 month h/o uncontrolled bilateral upper extremity pain ower 10 at night prevents sleep from neck to both hands, in day rated at a 5.  Was formally dx with a brachioplexy injury sustained on his job in 02/2016, case was closed 08/2016.Symptom at that time was only on left upper extremity New disabling bilateral arm pain   ROS Denies recent fever or chills. Denies sinus pressure, nasal congestion, ear pain or sore throat. Denies chest congestion, productive cough or wheezing. Denies chest pains, palpitations and leg swelling Denies abdominal pain, nausea, vomiting,diarrhea or constipation.   Denies dysuria, frequency, hesitancy or incontinence.  Denies headaches, seizures, numbness, or tingling.  Denies skin break down or rash.   PE  BP 114/82   Pulse (!) 59   Resp 16   Ht 6' (1.829 m)   Wt 204 lb (92.5 kg)   SpO2 96%   BMI 27.67 kg/m   Patient alert and oriented and in no cardiopulmonary distress.  HEENT: No facial asymmetry, EOMI,   oropharynx pink and moist.  Neck adequate ROM, no spasm no JVD, no mass.  Chest: Clear to auscultation bilaterally.  CVS: S1, S2 no murmurs, no S3.Regular rate.  ABD: Soft non tender.   Ext: No edema  MS: Adequate ROM spine, shoulders, hips and knees.Mild wasting of left thenar muscles, negative Tinel's sign  Skin: Intact, no ulcerations or rash noted.  Psych: Good eye contact, normal affect. Memory intact not anxious or depressed appearing.  CNS: CN 2-12 intact, grade 4 grip in left hand and reduced sensation in LUE  Assessment & Plan  Chronic hand pain 3 month h/o bilateral hand pain left greater than right , rated at a 10 plus associated with weakness and educed sensation in left arm and forearm.  H/o braichioplexy from job related injury in 2017 S/s mild left carpal tunnel, refer to ortho for eval, dg C spine is normal Tramadol and  tylenol for pain   Numbness and tingling of both upper extremities while sleeping Symptoms and signs suggestive of C spine disease, C ray of C spine is normal . Will need ortho evaluation for evaluation for carpal tunnel , left greater than right  Insomnia Pain is primary trigger by history, Sleep hygiene is reviewed and  Short course of Restoril low dose is prescribed

## 2017-07-11 ENCOUNTER — Encounter: Payer: Self-pay | Admitting: Family Medicine

## 2017-07-11 DIAGNOSIS — G4700 Insomnia, unspecified: Secondary | ICD-10-CM | POA: Insufficient documentation

## 2017-07-11 NOTE — Assessment & Plan Note (Signed)
Symptoms and signs suggestive of C spine disease, C ray of C spine is normal . Will need ortho evaluation for evaluation for carpal tunnel , left greater than right

## 2017-07-11 NOTE — Assessment & Plan Note (Signed)
Pain is primary trigger by history, Sleep hygiene is reviewed and  Short course of Restoril low dose is prescribed

## 2017-07-16 ENCOUNTER — Encounter: Payer: Self-pay | Admitting: Neurology

## 2017-07-16 ENCOUNTER — Other Ambulatory Visit: Payer: BLUE CROSS/BLUE SHIELD

## 2017-07-16 ENCOUNTER — Ambulatory Visit (INDEPENDENT_AMBULATORY_CARE_PROVIDER_SITE_OTHER): Payer: BLUE CROSS/BLUE SHIELD | Admitting: Neurology

## 2017-07-16 VITALS — BP 120/72 | HR 66 | Ht 72.0 in | Wt 204.0 lb

## 2017-07-16 DIAGNOSIS — R29898 Other symptoms and signs involving the musculoskeletal system: Secondary | ICD-10-CM | POA: Diagnosis not present

## 2017-07-16 DIAGNOSIS — M79601 Pain in right arm: Secondary | ICD-10-CM

## 2017-07-16 DIAGNOSIS — R202 Paresthesia of skin: Secondary | ICD-10-CM

## 2017-07-16 DIAGNOSIS — M79602 Pain in left arm: Secondary | ICD-10-CM

## 2017-07-16 MED ORDER — NORTRIPTYLINE HCL 10 MG PO CAPS: ORAL_CAPSULE | ORAL | 3 refills | Status: DC

## 2017-07-16 NOTE — Progress Notes (Signed)
[facility] Note - Initial Visit   Date: 07/16/17  Gabriel [name] MRN: [id number] DOB: Aug 10, 1975   Dear Dr. Delilah Shan:  Thank you for your kind referral of Gabriel [name] for consultation of bilateral arm weakness and paresthesias. Although his history is well known to you, please allow [place] to reiterate it for the purpose of our medical record. The patient was accompanied to the clinic by wife who also provides collateral information.     History of Present Illness: Gabriel [name] is a 42 y.o. right-handed African American male with no significant prior history referred for urgent evaluation of generalized paresthesias. He works as a Chartered certified accountant at [employer] and much of his responsibility involves lifting boxes (5-10lb) repetitively.      In October 2017, he developed left arm numbness and tingling involving the upper arm and forearm which has been relatively constant since this time.  He was loading a product into a dolly off his truck on a windy day and while doing this, his dolly was being pulled away by forceful gust and he abruptly tried to pull it back. He immediately developed tingling over the left upper arm and forearm.  There was no weakness initially.  He was evaluated by his company doctor the same day who offered a brace.  His numbness and tingling progressively worsened and he went to the ER where CT cervical spine did not show any structural pathology.  He established care with Dr. Rushie Nyhan and had MRI brachial plexus which returned normal. MRI of the cervical spine showed mild degenerative changes, no nerve impingement.  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    He returned to work in January 2018 and continues to have numbness/tingling of the right arm, but then developed similar numbness/tingling of the left arm, forearm, and hand.  It is worse at night.   He has some weakness of the hands, worse on the left,  and has been dropping objects.    Around the fall of 2018, he began having spells of numbness/tingling of the right thigh, leg, and foot.  It occurs about 3 times per week and will persist until he moves and stretches his leg, ~ 5-10 minutes.  He denies any weakness of the legs.  No falls.   He denies neck pain, vision changes, problems with swallowing/talking.  Symptoms are not worse in warmer temperatures. He has mild low back pain and mild headache every now and then.   Out-side paper records, electronic medical record, and images have been reviewed where available and summarized as:  CT cervical spine wo contrast 02/21/2016:  Slight straightening of the normal cervical lordosis. Otherwise within normal limits.  Lab Results  Component Value Date   HGBA1C 5.9 (H) 12/04/2016   Lab Results  Component Value Date   TSH 1.67 12/04/2016     Past Medical History:  Diagnosis Date  . Heart murmur   . Seasonal allergies     Past Surgical History:  Procedure Laterality Date  . NO PAST SURGERIES       Medications:  Outpatient Encounter Medications as of 07/16/2017  Medication Sig  . loratadine (CLARITIN) 10 MG tablet Take 1 tablet (10 mg total) by mouth daily as needed for allergies.  . mometasone (NASONEX) 50 MCG/ACT nasal spray Place 2 sprays into the nose daily.  . temazepam (RESTORIL) 7.5 MG capsule Take 1 capsule (7.5 mg total) by mouth at bedtime as needed for sleep.  . traMADol (  ULTRAM) 50 MG tablet One to two tablets at bedtime for pain   No facility-administered encounter medications on file as of 07/16/2017.      Allergies: No Known Allergies  Family History: Family History  Problem Relation Age of Onset  . Diabetes Father   . Hypertension Father   . Hypertension Mother   . Cancer Maternal Grandmother   . Cancer Maternal Grandfather   . Hypertension Maternal Grandfather   . Cancer Paternal Grandmother   . Hypertension Paternal Grandmother   . Cancer Paternal  Grandfather   . Hypertension Paternal Grandfather     Social History: Social History   Tobacco Use  . Smoking status: Never Smoker  . Smokeless tobacco: Never Used  Substance Use Topics  . Alcohol use: No    Alcohol/week: 0.0 oz  . Drug use: No   Social History   Social History Narrative  . Not on file    Review of Systems:  CONSTITUTIONAL: No fevers, chills, night sweats, or weight loss.   EYES: No visual changes or eye pain ENT: No hearing changes.  No history of nose bleeds.   RESPIRATORY: No cough, wheezing and shortness of breath.   CARDIOVASCULAR: Negative for chest pain, and palpitations.   GI: Negative for abdominal discomfort, blood in stools or black stools.  No recent change in bowel habits.   GU:  No history of incontinence.   MUSCLOSKELETAL: No history of joint pain or swelling.  No myalgias.   SKIN: Negative for lesions, rash, and itching.   HEMATOLOGY/ONCOLOGY: Negative for prolonged bleeding, bruising easily, and swollen nodes.  No history of cancer.   ENDOCRINE: Negative for cold or heat intolerance, polydipsia or goiter.   PSYCH:  No depression or anxiety symptoms.   NEURO: As Above.   Vital Signs:  BP 120/72   Pulse 66   Ht 6' (1.829 m)   Wt 204 lb (92.5 kg)   SpO2 95%   BMI 27.67 kg/m    General Medical Exam:   General:  Well appearing, comfortable.   Eyes/ENT: see cranial nerve examination.   Neck: No masses appreciated.  Full range of motion without tenderness.  No carotid bruits. Respiratory:  Clear to auscultation, good air entry bilaterally.   Cardiac:  Regular rate and rhythm, no murmur.   Extremities:  No deformities, edema, or skin discoloration.  Skin:  No rashes or lesions.  Neurological Exam: MENTAL STATUS including orientation to time, place, person, recent and remote memory, attention span and concentration, language, and fund of knowledge is normal.  Speech is not dysarthric.  CRANIAL NERVES: II:  No visual field defects.   Unremarkable fundi.   III-IV-VI: Pupils equal round and reactive to light.  Normal conjugate, extra-ocular eye movements in all directions of gaze.  No nystagmus.  No ptosis.   V:  Normal facial sensation.  Jaw jerk is absent.   VII:  Normal facial symmetry and movements.  No pathologic facial reflexes.  VIII:  Normal hearing and vestibular function.   IX-X:  Normal palatal movement.   XI:  Normal shoulder shrug and head rotation.   XII:  Normal tongue strength and range of motion, no deviation or fasciculation.  MOTOR:  No atrophy, fasciculations or abnormal movements.  No pronator drift.  Tone is normal.    Right Upper Extremity:    Left Upper Extremity:    Deltoid  5-/5   Deltoid  4/5   Biceps  5-/5   Biceps  5-/5  Triceps  5-/5   Triceps  4+/5   Wrist extensors  5-/5   Wrist extensors  5-/5   Wrist flexors  5/5   Wrist flexors  5-/5   Finger extensors  5/5   Finger extensors  5-/5   Finger flexors  5/5   Finger flexors  5-/5   Dorsal interossei  5/5   Dorsal interossei  5-/5   Abductor pollicis  5/5   Abductor pollicis  5-/5   Tone (Ashworth scale)  0  Tone (Ashworth scale)  0   Right Lower Extremity:    Left Lower Extremity:    Hip flexors  5/5   Hip flexors  5/5   Hip extensors  5/5   Hip extensors  5/5   Knee flexors  5/5   Knee flexors  5/5   Knee extensors  5/5   Knee extensors  5/5   Dorsiflexors  5/5   Dorsiflexors  5/5   Plantarflexors  5/5   Plantarflexors  5/5   Toe extensors  5/5   Toe extensors  5/5   Toe flexors  5/5   Toe flexors  5/5   Tone (Ashworth scale)  0  Tone (Ashworth scale)  0   MSRs:  Right                                                                 Left brachioradialis 2+  brachioradialis 2+  biceps 2+  biceps 2+  triceps 2+  triceps 2+  patellar tr  patellar tr  ankle jerk tr  ankle jerk tr  Hoffman no  Hoffman no  plantar response down  plantar response down   SENSORY:  Normal and symmetric perception of light touch, pinprick,  vibration, and proprioception.  Romberg's sign absent.   COORDINATION/GAIT: Normal finger-to- nose-finger and heel-to-shin.  Intact rapid alternating movements bilaterally. Gait narrow based and stable. Tandem and stressed gait intact.    IMPRESSION: Mr. Rohr is a 42 year-old man referred for evaluation of progressive paresthesias involving the upper extremities, worse on the left, as well as right leg since late 2017.  His exam shows mild generalized weakness of the left upper extremity, normal sensation throughout, and reduced reflexes in the legs.  There are no upper motor neuron findings.  Given the diffuse nature of his arm paresthesias and left arm weakness, I will start with NCS/EMG and see if there is any findings to support a peripheral nerve etiology (neuropathy vs polyradiculoneuropathy).  If this returns nondiagnostic, he will need repeat MRI cervical spine and MRI brain wwo contrast to evaluate for demyelinating disease, however, I would expect brisk reflexes and abnormal tone, if this was the case.   PLAN/RECOMMENDATIONS:  1.  Check ESR, CRP, TSH, vitamin B12, copper, vitamin D 2.  NCS/EMG of the left > right arm 3.  Start nortriptyline 22m at bedtime for 2 week, then increase to 2 tablet at bedtime  Further recommendations will be based on his results.   Thank you for allowing me to participate in patient's care.  If I can answer any additional questions, I would be pleased to do so.    Sincerely,    Donika K. PPosey Pronto DO

## 2017-07-16 NOTE — Patient Instructions (Addendum)
Start nortriptyline 10mg  at bedtime for 2 week, then increase to 2 tablet at bedtime  NCS/EMG of the arms  Check labs. Your provider has requested that you have labwork completed today. Please go to [facility] ([id number]) on the second floor of this building before leaving the office today. You do not need to check in. If you are not called within 15 minutes please check with the front desk.

## 2017-07-17 NOTE — Progress Notes (Signed)
MRI cervical spine without contrast [facility] 03/09/2016: 1. Minimal degenerative disc disease at C5-6 and C6-7. No disc protrusions or significant disc bulges. No neural impingement. 2. No spinal or foraminal stenosis. 3. Cervical spinal cord is normal. 4. Paraspinal soft tissues are normal.  MRI was personally reviewed and agree with findings.

## 2017-07-21 ENCOUNTER — Telehealth: Payer: Self-pay | Admitting: *Deleted

## 2017-07-21 LAB — VITAMIN D 1,25 DIHYDROXY
VITAMIN D3 1, 25 (OH): 45 pg/mL
Vitamin D 1, 25 (OH)2 Total: 45 pg/mL (ref 18–72)

## 2017-07-21 LAB — C-REACTIVE PROTEIN: CRP: 1.7 mg/L (ref <8.0)

## 2017-07-21 LAB — TSH: TSH: 2.11 mIU/L (ref 0.40–4.50)

## 2017-07-21 LAB — VITAMIN B12: Vitamin B-12: 568 pg/mL (ref 200–1100)

## 2017-07-21 LAB — COPPER, SERUM: Copper: 87 ug/dL (ref 70–175)

## 2017-07-21 LAB — SEDIMENTATION RATE: Sed Rate: 2 mm/h (ref 0–15)

## 2017-07-21 NOTE — Telephone Encounter (Signed)
Patient notified

## 2017-07-21 NOTE — Telephone Encounter (Signed)
-----   Message from Gl[place] Chardonika K Patel, DO sent at 07/21/2017  9:08 AM EDT ----- Please notify patient lab are within normal limits.  Thank you.

## 2017-07-22 ENCOUNTER — Ambulatory Visit (INDEPENDENT_AMBULATORY_CARE_PROVIDER_SITE_OTHER): Payer: BLUE CROSS/BLUE SHIELD | Admitting: Neurology

## 2017-07-22 DIAGNOSIS — M79602 Pain in left arm: Secondary | ICD-10-CM | POA: Diagnosis not present

## 2017-07-22 DIAGNOSIS — M79601 Pain in right arm: Secondary | ICD-10-CM

## 2017-07-22 DIAGNOSIS — R202 Paresthesia of skin: Secondary | ICD-10-CM

## 2017-07-22 DIAGNOSIS — M5412 Radiculopathy, cervical region: Secondary | ICD-10-CM

## 2017-07-22 DIAGNOSIS — G5603 Carpal tunnel syndrome, bilateral upper limbs: Secondary | ICD-10-CM

## 2017-07-22 NOTE — Procedures (Signed)
[facility]  [address], [address]  [place], [place] [address] Tel: [phone] Fax:  [phone] Test Date:  07/22/2017  Patient: Gabriel Settingerrence [name] DOB: 06/05/1975 Physician: Nita Sickleonika Keyri Salberg, DO  Sex: Male Height: 6\' 0"  Ref Phys: Nita Sickleonika Hannelore Bova, DO  ID#: [id number] Temp: 33.0C Technician:    Patient Complaints: This is a 42 year old man referred for evaluation of progressive paresthesias of the arms and weakness.  NCV & EMG Findings: Extensive electrodiagnostic testing of the left upper extremity and additional studies of the right shows:  1. Bilateral median sensory responses prolonged distal peak latency (L5.6, R5.3 ms) and reduced amplitude (L18.4, R16.1 V). Bilateral ulnar, bilateral radial, and left medial antebrachial cutaneous sensory responses are within normal limits. 2. 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 Bilateral ulnar motor responses are within normal limits. 3. Chronic motor axon loss changes isolated to the left biceps and deltoid muscles, without accompanied active denervation. These findings are not present in the right upper extremity.  Impression: 1. Bilateral median neuropathy at or distal to the wrist, consistent with clinical diagnosis of carpal tunnel syndrome. Overall, these findings are moderate in degree electrically. 2. 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 3. Incidentally, there is a Martin-Gruber anastomosis bilaterally, normal variant.    ___________________________ Nita Sickleonika Everlene Cunning, DO    Nerve Conduction Studies Anti Sensory Summary Table   Site NR Peak (ms) Norm Peak (ms) P-T Amp (V) Norm P-T Amp  Left Lat Ante Brach Cutan Anti Sensory (Lat Forearm)  33C    Lat Biceps    2.4 <2.9 28.3 >14  Left Med Ante Brach Cutan Anti Sensory (Med Forearm)  33C  Elbow    3.1  23.3   Left Median Anti Sensory (2nd Digit)  33C  Wrist    5.6 <3.4 18.4 >20  Right Median Anti Sensory (2nd Digit)  33C  Wrist    5.3 <3.4 16.1 >20  Left Radial Anti Sensory (Base 1st Digit)  33C  Wrist    2.6 <2.7 29.9 >18  Right Radial Anti Sensory (Base 1st Digit)  33C  Wrist    2.4 <2.7 29.1 >18  Left Ulnar Anti Sensory (5th Digit)  33C  Wrist    3.1 <3.1 24.1 >12  Right Ulnar Anti Sensory (5th Digit)  33C  Wrist    2.9 <3.1 33.5 >12   Motor Summary Table   Site NR Onset (ms) Norm Onset (ms) O-P Amp (mV) Norm O-P Amp Site1 Site2 Delta-0 (ms) Dist (cm) Vel (m/s) Norm Vel (m/s)  Left Median Motor (Abd Poll Brev)  33C  Wrist    5.5 <3.9 10.4 >6 Elbow Wrist 5.2 33.0 63 >50  Elbow    10.7  10.3  Ulnar-wrist crossover Elbow 5.4 0.0    Ulnar-wrist crossover    5.3  2.8         Right Median Motor (Abd Poll Brev)  33C  Wrist    4.9 <3.9 9.4 >6 Elbow Wrist 5.4 33.0 61 >50  Elbow    10.3  10.9  Ulnar-wrist crossover Elbow 5.8 0.0    Ulnar-wrist crossover    4.5  4.6         Left Ulnar Motor (Abd Dig Minimi)  33C  Wrist    2.5 <3.1 10.6 >  7 B Elbow Wrist 4.4 27.0 61 >50  B Elbow    6.9  10.6  A Elbow B Elbow 1.9 10.0 53 >50  A Elbow    8.8  10.3         Right Ulnar Motor (Abd Dig Minimi)  33C  Wrist    2.6 <3.1 10.8 >7 B Elbow Wrist 4.0 27.0 68 >50  B Elbow    6.6  10.4  A Elbow B Elbow 1.7 10.0 59 >50  A Elbow    8.3  10.1          EMG   Side Muscle Ins Act Fibs Psw Fasc Number Recrt Dur Dur. Amp Amp. Poly Poly. Comment  Right 1stDorInt Nml Nml Nml Nml Nml Nml Nml Nml Nml Nml Nml Nml N/A  Right Abd Poll Brev Nml Nml Nml Nml Nml Nml Nml Nml Nml Nml Nml Nml N/A  Right PronatorTeres Nml Nml Nml Nml Nml Nml Nml Nml Nml Nml Nml Nml N/A  Right Biceps Nml Nml Nml Nml Nml Nml Nml Nml Nml Nml Nml Nml N/A  Right Triceps Nml Nml Nml Nml Nml Nml Nml Nml Nml Nml Nml Nml N/A   Right Deltoid Nml Nml Nml Nml Nml Nml Nml Nml Nml Nml Nml Nml N/A  Left 1stDorInt Nml Nml Nml Nml Nml Nml Nml Nml Nml Nml Nml Nml N/A  Left Abd Poll Brev Nml Nml Nml Nml Nml Nml Nml Nml Nml Nml Nml Nml N/A  Left Ext Indicis Nml Nml Nml Nml Nml Nml Nml Nml Nml Nml Nml Nml N/A  Left PronatorTeres Nml Nml Nml Nml Nml Nml Nml Nml Nml Nml Nml Nml N/A  Left Biceps Nml Nml Nml Nml 1- Rapid Few 1+ Few 1+ Nml Nml N/A  Left Triceps Nml Nml Nml Nml Nml Nml Nml Nml Nml Nml Nml Nml N/A  Left Deltoid Nml Nml Nml Nml 1- Rapid Few 1+ Few 1+ Nml Nml N/A  Left Infraspinatus Nml Nml Nml Nml Nml Nml Nml Nml Nml Nml Nml Nml N/A  Left Cervical Parasp Low Nml Nml Nml Nml NE - - - - - - - N/A      Waveforms:

## 2017-07-23 ENCOUNTER — Telehealth: Payer: Self-pay | Admitting: *Deleted

## 2017-07-23 ENCOUNTER — Other Ambulatory Visit: Payer: Self-pay | Admitting: *Deleted

## 2017-07-23 DIAGNOSIS — R29898 Other symptoms and signs involving the musculoskeletal system: Secondary | ICD-10-CM

## 2017-07-23 DIAGNOSIS — R202 Paresthesia of skin: Secondary | ICD-10-CM

## 2017-07-23 NOTE — Telephone Encounter (Signed)
Patient notified that MRIs have been ordered.

## 2017-07-23 NOTE — Telephone Encounter (Signed)
-----   Message from Glendale Chardonika K Patel, DO sent at 07/22/2017  3:41 PM EDT ----- Please order MRI brain wwo contrast and MRI cervical spine wwo contrast.  Dx - bilateral arm weakness, arm paresthesias. Thanks.

## 2017-07-24 ENCOUNTER — Encounter: Payer: Self-pay | Admitting: Neurology

## 2017-07-29 ENCOUNTER — Telehealth: Payer: Self-pay | Admitting: *Deleted

## 2017-07-29 NOTE — Telephone Encounter (Signed)
[facility] is calling patient today.

## 2017-07-29 NOTE — Telephone Encounter (Signed)
-----   Message from Glend[place] Chardonika K Patel, DO sent at 07/28/2017  1:56 PM EDT ----- Please f/u on MRI. Thanks.

## 2017-08-14 ENCOUNTER — Ambulatory Visit
Admission: RE | Admit: 2017-08-14 | Discharge: 2017-08-14 | Disposition: A | Discharge status: Home / Self Care | Payer: BLUE CROSS/BLUE SHIELD | Source: Ambulatory Visit | Attending: Neurology | Admitting: Neurology

## 2017-08-14 DIAGNOSIS — R29898 Other symptoms and signs involving the musculoskeletal system: Secondary | ICD-10-CM

## 2017-08-14 DIAGNOSIS — R202 Paresthesia of skin: Secondary | ICD-10-CM

## 2017-08-14 MED ORDER — GADOBENATE DIMEGLUMINE 529 MG/ML IV SOLN
19.0000 mL | Freq: Once | INTRAVENOUS | Status: AC | PRN
  Administered 2017-08-14: 19 mL via INTRAVENOUS

## 2017-08-14 MED ORDER — GADOBENATE DIMEGLUMINE 529 MG/ML IV SOLN
19.0000 mL | Freq: Once | INTRAVENOUS | Status: AC | PRN
Start: 2017-08-14 — End: 2017-08-14
  Administered 2017-08-14: 19 mL via INTRAVENOUS

## 2017-08-15 ENCOUNTER — Telehealth: Payer: Self-pay | Admitting: *Deleted

## 2017-08-15 NOTE — Telephone Encounter (Signed)
-----   Message from Donika K Patel, DO sent at 08/15/2017  8:45 AM EDT ----- Please call and inform patient that his MRI brain is normal - no signs of anything worrisome such as multiple sclerosis or stroke.  MRI cervical spine also looks greats, there is very mild age-related change, no signs of nerve impingement.  His nerve testing shows moderate bilateral carpal tunnel syndrome, recommend starting occupational therapy to work on his arm strength and for CTS.  Thanks. 

## 2017-08-15 NOTE — Telephone Encounter (Signed)
-----   Message from [place] Chardonika K Patel, DO sent at 08/15/2017  8:45 AM EDT ----- Please call and inform patient that his MRI brain is normal - no signs of anything worrisome such as multiple sclerosis or stroke.  MRI cervical spine also looks greats, there is very mild age-related change, no signs of nerve impingement.  His nerve testing shows moderate bilateral carpal tunnel syndrome, recommend starting occupational therapy to work on his arm strength and for CTS.  Thanks.

## 2017-08-15 NOTE — Telephone Encounter (Signed)
Left message

## 2017-08-15 NOTE — Telephone Encounter (Signed)
Left message for patient to call me back. 

## 2017-08-15 NOTE — Telephone Encounter (Signed)
Left message giving patient's wife results and instructions. Requested for them to call me back to let me know where they would like for the OT to be set up.

## 2017-08-18 ENCOUNTER — Other Ambulatory Visit: Payer: Self-pay | Admitting: *Deleted

## 2017-08-18 ENCOUNTER — Telehealth: Payer: Self-pay | Admitting: *Deleted

## 2017-08-18 DIAGNOSIS — G5603 Carpal tunnel syndrome, bilateral upper limbs: Secondary | ICD-10-CM

## 2017-08-18 NOTE — Telephone Encounter (Signed)
I spoke with Gabriel [name] and gave her the results and instructions.  [facility].

## 2017-08-18 NOTE — Telephone Encounter (Signed)
-----   Message from Donika K Patel, DO sent at 08/15/2017  8:45 AM EDT ----- Please call and inform patient that his MRI brain is normal - no signs of anything worrisome such as multiple sclerosis or stroke.  MRI cervical spine also looks greats, there is very mild age-related change, no signs of nerve impingement.  His nerve testing shows moderate bilateral carpal tunnel syndrome, recommend starting occupational therapy to work on his arm strength and for CTS.  Thanks. 

## 2017-08-22 ENCOUNTER — Ambulatory Visit (HOSPITAL_COMMUNITY): Payer: BLUE CROSS/BLUE SHIELD | Attending: Neurology | Admitting: Occupational Therapy

## 2017-08-22 ENCOUNTER — Other Ambulatory Visit: Payer: Self-pay

## 2017-08-22 ENCOUNTER — Encounter (HOSPITAL_COMMUNITY): Payer: Self-pay | Admitting: Occupational Therapy

## 2017-08-22 DIAGNOSIS — R29898 Other symptoms and signs involving the musculoskeletal system: Secondary | ICD-10-CM | POA: Diagnosis present

## 2017-08-22 DIAGNOSIS — G5603 Carpal tunnel syndrome, bilateral upper limbs: Secondary | ICD-10-CM | POA: Insufficient documentation

## 2017-08-22 DIAGNOSIS — M25531 Pain in right wrist: Secondary | ICD-10-CM | POA: Diagnosis present

## 2017-08-22 DIAGNOSIS — M25532 Pain in left wrist: Secondary | ICD-10-CM | POA: Diagnosis present

## 2017-08-22 NOTE — Therapy (Signed)
[facility] [address] [place], [place], [address] Phone: [phone]   Fax:  [phone]  Occupational Therapy Evaluation  Patient Details  Name: Gabriel [name] MRN: [id number] Date of Birth: 04/05/1976 Referring Provider: Nita Sickleonika Patel, DO   Encounter Date: 08/22/2017  OT End of Session - 08/22/17 2004    Visit Number  1    Number of Visits  12    Date for OT Re-Evaluation  10/03/17    Authorization Type  BC[employer]; no copay and no visit limit    OT Start Time  1520    OT Stop Time  1559    OT Time Calculation (min)  39 min    Activity Tolerance  Patient tolerated treatment well    Behavior During Therapy  [facility] for tasks assessed/performed       Past Medical History:  Diagnosis Date  . Heart murmur   . Seasonal allergies     Past Surgical History:  Procedure Laterality Date  . NO PAST SURGERIES      There were no vitals filed for this visit.  Subjective Assessment - 08/22/17 1959    Subjective   S: Nighttime is worse than daytime.     Pertinent History  Pt is a 42 y/o male presenting with bilateral carpal tunnel syndrome present for approximately one year. Pt experiences numbness and pain in bilateral wrists, as well as decreased grip strength. Pt was referred to occupational therapy for evaluation and treatment by Nita Sickleonika Patel, DO.     Patient Stated Goals  To have less pain in my wrists and improve my strength.     Currently in Pain?  No/denies        [facility] - 08/22/17 1521      Assessment   Medical Diagnosis  Bilateral carpal tunnel syndrome    Referring Provider  Nita Sickleonika Patel, DO    Onset Date/Surgical Date  -- approximately 1 year ago    Hand Dominance  Right    Prior Therapy  None      Precautions   Precautions  None      Restrictions   Weight Bearing Restrictions  No      Balance Screen   Has the patient fallen in the past 6 months  No    Has the patient had a decrease in activity level  because of a fear of falling?   No    Is the patient reluctant to leave their home because of a fear of falling?   No      Prior Function   Level of Independence  Independent    Vocation  Full time employment    Vocation Requirements  [employer]-lifting, reaching, driving, pulling, rotational movements    Leisure  spending time with kids (5 y/o, 89 y/o)      ADL   ADL comments  Pt is having difficulty with lifting items, carrying items, grasping objects, sleeping. Pain is worse at night      Written Expression   Dominant Hand  Right      Cognition   Overall Cognitive Status  Within Functional Limits for tasks assessed      Sensation   Semmes Weinstein Monofilament Scale  Diminished Light Touch    Hot/Cold  Appears Intact    Proprioception  Appears Intact      ROM / Strength   AROM / PROM / Strength  AROM;PROM;Strength      AROM   AROM Assessment  Site  Wrist;Forearm    Right/Left Forearm  Right;Left    Right Forearm Pronation  90 Degrees    Right Forearm Supination  80 Degrees    Left Forearm Pronation  90 Degrees    Left Forearm Supination  80 Degrees    Right/Left Wrist  Left    Right Wrist Extension  56 Degrees    Right Wrist Flexion  50 Degrees    Right Wrist Radial Deviation  25 Degrees    Right Wrist Ulnar Deviation  25 Degrees    Left Wrist Extension  62 Degrees    Left Wrist Flexion  60 Degrees    Left Wrist Radial Deviation  25 Degrees    Left Wrist Ulnar Deviation  25 Degrees      PROM   Overall PROM   Within functional limits for tasks performed    PROM Assessment Site  Forearm;Wrist    Right/Left Forearm  Right;Left    Right/Left Wrist  Right;Left      Strength   Strength Assessment Site  Forearm;Wrist;Hand    Right/Left Forearm  Right;Left    Right Forearm Pronation  3+/5    Right Forearm Supination  3+/5    Left Forearm Pronation  3+/5    Left Forearm Supination  3+/5    Right/Left Wrist  Right;Left    Right Wrist Flexion  4-/5    Right Wrist Extension   3+/5    Right Wrist Radial Deviation  4-/5    Right Wrist Ulnar Deviation  4-/5    Left Wrist Flexion  4-/5    Left Wrist Extension  3+/5    Left Wrist Radial Deviation  4-/5    Left Wrist Ulnar Deviation  4-/5    Right/Left hand  Right;Left    Right Hand Gross Grasp  Functional    Right Hand Grip (lbs)  48    Right Hand Lateral Pinch  9 lbs    Right Hand 3 Point Pinch  7 lbs    Left Hand Gross Grasp  Functional    Left Hand Grip (lbs)  34    Left Hand Lateral Pinch  9 lbs    Left Hand 3 Point Pinch  6 lbs                      OT Education - 08/22/17 2004    Education provided  Yes    Education Details  tendon glides, red theraputty grip/pinch strengthening    Person(s) Educated  Patient    Methods  Explanation;Demonstration;Handout    Comprehension  Verbalized understanding;Returned demonstration       OT Short Term Goals - 08/22/17 2010      OT SHORT TERM GOAL #1   Title  Pt will be provided with and educated on HEP to improve B/IADL completion.     Time  3    Period  Weeks    Status  New    Target Date  09/12/17      OT SHORT TERM GOAL #2   Title  Pt will decrease pain in bilateral wrists to 5/10 to improve ability to sleep at night.     Time  3    Period  Weeks    Status  New      OT SHORT TERM GOAL #3   Title  Pt will be educated on and provided with prefabricated or custom fabricated splints/braces for nighttime use to maintain neutral positioning for decreased pain.  Time  3    Period  Weeks    Status  New      OT SHORT TERM GOAL #4   Title  Pt will increase bilateral grip strength by 12# and pinch strength by 3# to improve ability to perform meal preparation tasks.     Time  6    Period  Weeks    Status  New        OT Long Term Goals - 08/22/17 2012      OT LONG TERM GOAL #1   Title  Pt will return to highest level of functioning during ADL and work tasks with bilateral hand use.     Time  6    Period  Weeks    Status  New     Target Date  10/03/17      OT LONG TERM GOAL #2   Title  Pt will decrease pain in bilateral wrists to 3/10 or less to improve ability to complete work tasks.     Time  6    Status  New      OT LONG TERM GOAL #3   Title  Pt will improve bilateral wrist strength to 4+/5 to increase ability to perform lifting/pulling tasks at work.     Time  6    Period  Weeks    Status  New      OT LONG TERM GOAL #4   Title  Pt will improve bilateral grip strength by 25# and pinch strength by 6# to improve ability to lift and carry objects.     Time  6    Status  New            Plan - 08/22/17 2005    Clinical Impression Statement  A: Pt is a 42 y/o male presenting with bilateral carpal tunnel syndrome present for approximately 1 year limiting ability to complete B/IADLs at highest level of independence. Pt reports pain is worse at night when trying to go to sleep, sometimes getting as high as a 10/10. Pt is not currently using any wrist braces/splints. Educated on HEP for tendon glides and grip strengthening.     Occupational Profile and client history currently impacting functional performance  Pt is independent and motivated to return to highest level of functioning.     Occupational performance deficits (Please refer to evaluation for details):  ADL's;IADL's;Rest and Sleep;Work;Social Participation;Leisure    Rehab Potential  Good    OT Frequency  2x / week    OT Duration  6 weeks    OT Treatment/Interventions  Cryotherapy;Self-care/ADL training;Paraffin;Therapeutic exercise;Electrical Stimulation;Ultrasound;Manual Therapy;Splinting;Moist Heat;Iontophoresis;Passive range of motion;Therapeutic activities;Patient/family education    Plan  P: Pt will benefit from skilled OT services to decrease pain, increase ROM, strength, and functional use of bilateral wrists and hands. Treatment plan: manual therapy, modalities, splinting, ROM, wrist strengthening, grip and pinch strengthening    Clinical  Decision Making  Limited treatment options, no task modification necessary    OT Home Exercise Plan  08/22/17: tendon glides, red theraputty for grip strengthening    Consulted and Agree with Plan of Care  Patient       Patient will benefit from skilled therapeutic intervention in order to improve the following deficits and impairments:  Impaired sensation, Decreased range of motion, Pain, Decreased activity tolerance, Decreased strength, Impaired UE functional use  Visit Diagnosis: Carpal tunnel syndrome, bilateral  Pain in right wrist  Pain in left wrist  Other symptoms and signs  involving the musculoskeletal system  Wrist weakness    Problem List Patient Active Problem List   Diagnosis Date Noted  . Insomnia 07/11/2017  . Chronic hand pain 07/10/2017  . Numbness and tingling of both upper extremities while sleeping 02/21/2016  . Work related injury 02/21/2016  . Sternal pain 09/14/2015  . IGT (impaired glucose tolerance) 10/31/2012  . Perennial allergic rhinitis 10/19/2012  . Atopic eczema 10/19/2012   Ezra Sites, OTR/L  [phone] 08/22/2017, 8:18 PM  [facility] [address] [place], [place], [address] Phone: [phone]   Fax:  [phone]  Name: Gabriel [name] MRN: [id number] Date of Birth: 1976-04-08

## 2017-08-22 NOTE — Patient Instructions (Addendum)
Tendon Gliding Exercises: Complete 10X, hold for 3-5 seconds, 3x per day  1) Straight: begin with wrist in extended position and fingers straight      2) Hook: Bend your fingers making them look like a hook while keeping your thumb straight.      3) Fist: Make your hand into a fist.      4) Table Top: Straighten your fingers straight out making them look like a table top.      5) Straight Fist: Bend your fingers straight down into a straight fist.     Home Exercises Program Theraputty Exercises  Do the following exercises 2 times a day using your affected hand.  1. Roll putty into a ball.  2. Make into a pancake.  3. Roll putty into a roll.  4. Pinch along log with first finger and thumb.   5. Make into a ball.  6. Roll it back into a log.   7. Pinch using thumb and side of first finger.  8. Roll into a ball, then flatten into a pancake.

## 2017-08-27 ENCOUNTER — Encounter (HOSPITAL_COMMUNITY): Payer: Self-pay | Admitting: Occupational Therapy

## 2017-08-27 ENCOUNTER — Ambulatory Visit (HOSPITAL_COMMUNITY): Payer: BLUE CROSS/BLUE SHIELD | Admitting: Occupational Therapy

## 2017-08-27 DIAGNOSIS — R29898 Other symptoms and signs involving the musculoskeletal system: Secondary | ICD-10-CM

## 2017-08-27 DIAGNOSIS — G5603 Carpal tunnel syndrome, bilateral upper limbs: Secondary | ICD-10-CM

## 2017-08-27 DIAGNOSIS — M25531 Pain in right wrist: Secondary | ICD-10-CM

## 2017-08-27 DIAGNOSIS — M25532 Pain in left wrist: Secondary | ICD-10-CM

## 2017-08-27 NOTE — Therapy (Signed)
[facility] [address] [place], [place], [address] Phone: [phone]   Fax:  [phone]  Occupational Therapy Treatment  Patient Details  Name: Gabriel Settingerrence Salzman MRN: [id number] Date of Birth: 01/19/1976 Referring Provider: Nita Sickleonika Patel, DO   Encounter Date: 08/27/2017  OT End of Session - 08/27/17 1017    Visit Number  2    Number of Visits  12    Date for OT Re-Evaluation  10/03/17    Authorization Type  [employer]; no copay and no visit limit    OT Start Time  0901    OT Stop Time  0946    OT Time Calculation (min)  45 min    Activity Tolerance  Patient tolerated treatment well    Behavior During Therapy  [facility] for tasks assessed/performed       Past Medical History:  Diagnosis Date  . Heart murmur   . Seasonal allergies     Past Surgical History:  Procedure Laterality Date  . NO PAST SURGERIES      There were no vitals filed for this visit.  Subjective Assessment - 08/27/17 0901    Subjective   S: The right wrist feels tight today.     Currently in Pain?  No/denies         [facility] - 08/27/17 0900      Assessment   Medical Diagnosis  Bilateral carpal tunnel syndrome      Precautions   Precautions  None               OT Treatments/Exercises (OP) - 08/27/17 0908      ADLs   ADL Comments  Provided in formation and education on ordering braces for nighttime use. Measured pt: right wrist: 17.25cm, left wrist: 18.25      Exercises   Exercises  Wrist;Hand;Theraputty      Wrist Exercises   Wrist Flexion  AROM;10 reps    Wrist Extension  AROM;10 reps    Wrist Radial Deviation  AROM;10 reps    Wrist Ulnar Deviation  AROM;10 reps    Other wrist exercises  supination/pronation, A/ROM, 10X    Other wrist exercises  tendon glides, 10X each hand      Additional Wrist Exercises   Sponges  Right: 17, 21 Left: 22,     Hand Gripper with Large Beads  Right: all beads gripper at 42#; Left: all beads  gripper at 42#    Hand Gripper with Medium Beads  Right: all beads gripper at 42#; Left: all beads gripper at 42#             OT Education - 08/27/17 0921    Education provided  Yes    Education Details  wrist A/ROM    Person(s) Educated  Patient    Methods  Explanation;Demonstration;Handout    Comprehension  Verbalized understanding;Returned demonstration       OT Short Term Goals - 08/27/17 1018      OT SHORT TERM GOAL #1   Title  Pt will be provided with and educated on HEP to improve B/IADL completion.     Time  3    Period  Weeks    Status  On-going      OT SHORT TERM GOAL #2   Title  Pt will decrease pain in bilateral wrists to 5/10 to improve ability to sleep at night.     Time  3    Period  Weeks  Status  On-going      OT SHORT TERM GOAL #3   Title  Pt will be educated on and provided with prefabricated or custom fabricated splints/braces for nighttime use to maintain neutral positioning for decreased pain.     Time  3    Period  Weeks    Status  On-going      OT SHORT TERM GOAL #4   Title  Pt will increase bilateral grip strength by 12# and pinch strength by 3# to improve ability to perform meal preparation tasks.     Time  6    Period  Weeks    Status  On-going        OT Long Term Goals - 08/27/17 1018      OT LONG TERM GOAL #1   Title  Pt will return to highest level of functioning during ADL and work tasks with bilateral hand use.     Time  6    Period  Weeks    Status  On-going      OT LONG TERM GOAL #2   Title  Pt will decrease pain in bilateral wrists to 3/10 or less to improve ability to complete work tasks.     Time  6    Status  On-going      OT LONG TERM GOAL #3   Title  Pt will improve bilateral wrist strength to 4+/5 to increase ability to perform lifting/pulling tasks at work.     Time  6    Period  Weeks    Status  On-going      OT LONG TERM GOAL #4   Title  Pt will improve bilateral grip strength by 25# and pinch strength  by 6# to improve ability to lift and carry objects.     Time  6    Status  On-going            Plan - 08/27/17 1018    Clinical Impression Statement  A: Initiated wrist A/ROM, grip strengthening, and tendon gliding exercises this session, pt with more difficulty using right hand versus left hand today. Verbal cuing for form and technique with exercises. Provided pt with information on wrist braces for nighttime use and compression/support sleeves for daytime support. Pt reports he may have a work situation coming up that will affect is insurance and will let us know regarding his appointments. Updated HEP.     Plan  P: Follow up on HEP, add small beads to grip strengthening; progress to weights with wrist strengthening when appropriate       Patient will benefit from skilled therapeutic intervention in order to improve the following deficits and impairments:  Impaired sensation, Decreased range of motion, Pain, Decreased activity tolerance, Decreased strength, Impaired UE functional use  Visit Diagnosis: Carpal tunnel syndrome, bilateral  Pain in right wrist  Pain in left wrist  Other symptoms and signs involving the musculoskeletal system  Wrist weakness    Problem List Patient Active Problem List   Diagnosis Date Noted  . Insomnia 07/11/2017  . Chronic hand pain 07/10/2017  . Numbness and tingling of both upper extremities while sleeping 02/21/2016  . Work related injury 02/21/2016  . Sternal pain 09/14/2015  . IGT (impaired glucose tolerance) 10/31/2012  . Perennial allergic rhinitis 10/19/2012  . Atopic eczema 10/19/2012   Gabriel [name], Gabriel [name]  [phone] 08/27/2017, 12:04 PM  [facility] [address] [place], [place], [address] Phone: [phone]  Fax:  [phone]  Name: Gabriel [name] MRN: [id number] Date of Birth: Oct 10, 1975

## 2017-08-27 NOTE — Patient Instructions (Signed)
AROM Exercises: Complete exercises ___10___ times each, ___2____ times per day (don't complete at nighttime)  1) Wrist Flexion  Start with wrist at edge of table, palm facing up. With wrist hanging slightly off table, curl wrist upward, and back down.      2) Wrist Extension  Start with wrist at edge of table, palm facing down. With wrist slightly off the edge of the table, curl wrist up and back down.      3) Radial Deviations  Start with forearm flat against a table, wrist hanging slightly off the edge, and palm facing the wall. Bending at the wrist only, and keeping palm facing the wall, bend wrist so fist is pointing towards the floor, back up to start position, and up towards the ceiling. Return to start.        4) WRIST PRONATION  Turn your forearm towards palm face down.  Keep your elbow bent and by the side of your  Body.      5) WRIST SUPINATION  Turn your forearm towards palm face up.  Keep your elbow bent and by the side of your  Body.

## 2017-08-28 ENCOUNTER — Encounter (HOSPITAL_COMMUNITY): Payer: BLUE CROSS/BLUE SHIELD | Admitting: Occupational Therapy

## 2017-09-03 ENCOUNTER — Encounter (HOSPITAL_COMMUNITY): Payer: BLUE CROSS/BLUE SHIELD | Admitting: Occupational Therapy

## 2017-09-04 ENCOUNTER — Encounter (HOSPITAL_COMMUNITY): Payer: BLUE CROSS/BLUE SHIELD | Admitting: Occupational Therapy

## 2017-09-08 ENCOUNTER — Telehealth: Payer: Self-pay

## 2017-09-08 ENCOUNTER — Encounter: Payer: Self-pay | Admitting: Family Medicine

## 2017-09-08 ENCOUNTER — Ambulatory Visit (INDEPENDENT_AMBULATORY_CARE_PROVIDER_SITE_OTHER): Payer: BLUE CROSS/BLUE SHIELD | Admitting: Family Medicine

## 2017-09-08 ENCOUNTER — Telehealth: Payer: Self-pay | Admitting: Family Medicine

## 2017-09-08 ENCOUNTER — Telehealth (INDEPENDENT_AMBULATORY_CARE_PROVIDER_SITE_OTHER): Payer: Self-pay

## 2017-09-08 VITALS — BP 118/80 | HR 90 | Resp 16 | Ht 72.0 in | Wt 205.0 lb

## 2017-09-08 DIAGNOSIS — F32A Depression, unspecified: Secondary | ICD-10-CM

## 2017-09-08 DIAGNOSIS — F329 Major depressive disorder, single episode, unspecified: Secondary | ICD-10-CM

## 2017-09-08 MED ORDER — MIRTAZAPINE 7.5 MG PO TABS: 7.5000 mg | ORAL_TABLET | Freq: Every day | ORAL | 3 refills | Status: DC

## 2017-09-08 NOTE — Progress Notes (Signed)
   Gabriel [name]     MRN: [id number]      DOB: 1975-10-11   HPI Mr. Spalla is here today tearful stating he feels as though he let down his family and himself by being terminated from his job of 10 years where he held a senior position, because he felt as though he got passed over for a job which was not posted but was handed to another employer who was less qualified. He reports excessive crying, poor sleep and no appetite, feels the need to verbalize how he feels to get past this, he is not suicidal or homicidal. Refused to sign a severance package which was very restrictive and actually plans legal recourse, currently se king new employment   ROS Denies recent fever or chills. Denies sinus pressure, nasal congestion, ear pain or sore throat. Denies chest congestion, productive cough or wheezing. Denies chest pains, palpitations and leg swelling Denies abdominal pain, nausea, vomiting,diarrhea or constipation.   Denies dysuria, frequency, hesitancy or incontinence. Denies joint pain, swelling and limitation in mobility. Denies headaches, seizures, numbness, or tingling.  Denies skin break down or rash.   PE  BP 118/80   Pulse 90   Resp 16   Ht 6' (1.829 m)   Wt 205 lb (93 kg)   SpO2 97%   BMI 27.80 kg/m   Tearful, and crying   Patient alert and oriented and in no cardiopulmonary distress.  HEENT: No facial asymmetry, EOMI,   oropharynx pink and moist.  Neck supple no JVD, no mass.  Chest: Clear to auscultation bilaterally.  CVS: S1, S2 no murmurs, no S3.Regular rate.  ABD: Soft non tender.   Ext: No edema  MS: Adequate ROM spine, shoulders, hips and knees.  Skin: Intact, no ulcerations or rash noted.  Psych: Good eye contact, tearful affect. Memory intact  anxious and  depressed appearing.  CNS: CN 2-12 intact, power,  normal throughout.no focal deficits noted.   Assessment & Plan  Acute depression Severe depression triggered by job termination on4/19 not  suicidal or homicidal, pt to start remeron and he is being referred to psychotherapy

## 2017-09-08 NOTE — BH Specialist Note (Signed)
Error in charting.

## 2017-09-08 NOTE — Assessment & Plan Note (Addendum)
Severe depression triggered by job termination on4/19 not suicidal or homicidal, pt to start remeron and he is being referred to psychotherapy

## 2017-09-08 NOTE — Patient Instructions (Signed)
F/u in 2.5 months   New medication to be started for depression and sleep  You are referred to behavior health telepsych

## 2017-09-08 NOTE — Telephone Encounter (Signed)
Mr Day wanted the phone number to call that he said you were going to give him and he forgot to get it.  Can you give me the number he should call and I will call him or if you want to call him let me know.    Thanks

## 2017-09-08 NOTE — BH Specialist Note (Signed)
[facility] Initial Clinical Assessment  MRN: [id number] NAME: Jaidan Prevette Date: 09/08/17   Total time: 30 minutes  Type of Contact: Type of Contact: Video Visit Initial Contact Patient consent obtained: Patient consent obtained for Virtual Visit: Yes Reason for Visit today: Reason for Your Call/Visit Today: VBH Initial Assessment   Treatment History Patient recently received Inpatient Treatment: Have You Recently Been in Any Inpatient Treatment (Hospital/Detox/Crisis Center/28-Day Program)?: No  Facility/Program:  NA  Date of discharge:  NA Patient currently being seen by therapist/psychiatrist: Do You Currently Have a Therapist/Psychiatrist?: No Patient currently receiving the following services: Patient Currently Receiving the Following Services:: (None Reported)  Past Psychiatric History/Hospitalization(s): Anxiety: Yes Bipolar Disorder: No Depression: Yes Mania: No Psychosis: No Schizophrenia: No Personality Disorder: No Hospitalization for psychiatric illness: No History of Electroconvulsive Shock Therapy: No Prior Suicide Attempts: No  Decreased need for sleep: No  Euphoria: No Self Injurious behaviors No Family History of mental illness: No Family History of substance abuse: No  Substance Abuse: No  DUI: No  Insomnia: Yes  History of violence No  Physical, sexual or emotional abuse:No  Prior outpatient mental health therapy: No      Clinical Assessment:  PHQ-9 Assessments: Depression screen [facility] 2/9 09/08/2017 09/08/2017 02/19/2017  Decreased Interest 3 3 0  Down, Depressed, Hopeless 3 3 0  PHQ - 2 Score 6 6 0  Altered sleeping 3 3 0  Tired, decreased energy 3 2 0  Change in appetite 3 3 0  Feeling bad or failure about yourself  3 3 0  Trouble concentrating 1 3 0  Moving slowly or fidgety/restless 1 3 0  Suicidal thoughts 0 0 0  PHQ-9 Score 20 23 0  Difficult doing work/chores Extremely dIfficult Extremely dIfficult -    GAD-7  Assessments: GAD 7 : Generalized Anxiety Score 09/08/2017  Nervous, Anxious, on Edge 2  Control/stop worrying 3  Worry too much - different things 1  Trouble relaxing 3  Restless 1  Easily annoyed or irritable 1  Afraid - awful might happen 3  Total GAD 7 Score 14  Anxiety Difficulty Very difficult      Social Functioning Social maturity: Social Maturity: Responsible Social judgement: Social Judgement: Normal, Victimized  Stress Current stressors: Current Stressors: (Job related stress.) Familial stressors: Familial Stressors: None Sleep: Sleep: Decreased, Difficulty falling asleep, Difficulty staying asleep Appetite: Appetite: Decreased, Loss of appetite Coping ability: Coping ability: Overwhelmed, Exhausted Patient taking medications as prescribed: Patient taking medications as prescribed: No prescribed medications                  Current medications:  Outpatient Encounter Medications as of 09/08/2017  Medication Sig  . loratadine (CLARITIN) 10 MG tablet Take 1 tablet (10 mg total) by mouth daily as needed for allergies.  . mirtazapine (REMERON) 7.5 MG tablet Take 1 tablet (7.5 mg total) by mouth at bedtime.  . mometasone (NASONEX) 50 MCG/ACT nasal spray Place 2 sprays into the nose daily.  . nortriptyline (PAMELOR) 10 MG capsule Start nortriptyline  at bedtime for 2 week, then increase to 2 tablet at bedtime (Patient not taking: Reported on 09/08/2017)  . temazepam (RESTORIL) 7.5 MG capsule Take 1 capsule (7.5 mg total) by mouth at bedtime as needed for sleep. (Patient not taking: Reported on 09/08/2017)  . traMADol (ULTRAM) 50 MG tablet One to two tablets at bedtime for pain   No facility-administered encounter medications on file as of 09/08/2017.     Self-harm Behaviors  Risk Assessment Self-harm risk factors: Self-harm risk factors: (None Reported) Patient endorses recent thoughts of harming self: Have you recently had any thoughts about harming yourself?:  No    Danger to Others Risk Assessment Danger to others risk factors: Danger to Others Risk Factors: No risk factors noted Patient endorses recent thoughts of harming others: Notification required: No need or identified person  Dynamic Appraisal of Situational Aggression (DASA): No flowsheet data found.  Substance Use Assessment Patient recently consumed alcohol: Have you recently consumed alcohol?: No Patient recently used drugs: Have you recently used any drugs?: No Patient is concerned about dependence or abuse of substances: Does patient seem concerned about dependence or abuse of any substance?: No   Goals, Interventions and Follow-up Plan Goals: Increase healthy adjustment to current life circumstances Interventions: Motivational Interviewing, Solution-Focused Strategies, Mindfulness or Management consultant, Supportive Counseling, Medication Monitoring and Sleep Hygiene  Follow-up Plan: VBH Phone Follow Up  Summary of Clinical Assessment Patient is a 42 year old married man that reports depression associated with 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  Patient reports that he was employed at [employer] for 10 years. Patient reports that he refused to sign severance package  Patient reports that he is the primary income earner for the family.  Patient feels as if he let his family down due to losing her job    Patient reports that he feels as if he list his job due to racial discrimination.  Patient has contacted the EEOC and is in the process of contacting a Clinical research associate.    Patient reports getting 2 hrs a sleep and not getting enough to ear.  Patient is married and has two small children at home.         Phillip Heal LaVerne, LCAS-A

## 2017-09-10 ENCOUNTER — Encounter (HOSPITAL_COMMUNITY): Payer: Self-pay | Admitting: Psychiatry

## 2017-09-10 ENCOUNTER — Encounter (HOSPITAL_COMMUNITY): Payer: BLUE CROSS/BLUE SHIELD | Admitting: Occupational Therapy

## 2017-09-10 ENCOUNTER — Telehealth: Payer: Self-pay | Admitting: Clinical

## 2017-09-10 DIAGNOSIS — F32A Depression, unspecified: Secondary | ICD-10-CM

## 2017-09-10 DIAGNOSIS — F329 Major depressive disorder, single episode, unspecified: Secondary | ICD-10-CM

## 2017-09-10 NOTE — Telephone Encounter (Signed)
09/10/17 Gabriel [name] left a message on the [facility] voicemail requesting a call back and would like to continue [employer] services.

## 2017-09-10 NOTE — BH Specialist Note (Signed)
TC to Mr. Montz, returning his call from earlier today.  This VBH specialist introduced herself since Ava is not available.  Mr. Meader reported things are ok today, he was able to spend time with his son today which has helped him.  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  Mr. Viverette would like weekly phone call follow up at this time, preferably Wednesday afternoons.  This VBH also confirmed that Mr. Steinke is taking nortriptyline as prescribed by neurologist. [facility] specialist explained that [facility] team has a psychiatrist that reviewed his medications and can make recommendations to his PCP.  Mr. Pinkham acknowledged understanding.  Plan: This VBH specialist will follow up with patient next Wednesday and complete another PHQ-9.

## 2017-09-12 ENCOUNTER — Encounter (HOSPITAL_COMMUNITY): Payer: BLUE CROSS/BLUE SHIELD | Admitting: Occupational Therapy

## 2017-09-16 ENCOUNTER — Encounter (HOSPITAL_COMMUNITY): Payer: BLUE CROSS/BLUE SHIELD | Admitting: Occupational Therapy

## 2017-09-17 ENCOUNTER — Telehealth: Payer: Self-pay | Admitting: Clinical

## 2017-09-17 NOTE — Telephone Encounter (Signed)
This encounter was created in error - please disregard.

## 2017-09-17 NOTE — Progress Notes (Signed)
[facility] ([facility]) Psychiatric Consultant Case Review   Summary Gabriel [name] is a 42 y.o. year old male. He reports worsening depression in the context of job termination. Mirtazapine was started by his PCP.   Of note, the patient was seen by neurologist in 07/2017 and was started on nortriptyline with uptitration for progressive paresthesias involving the upper extremities.   Psychosocial factors: job termination  Current Medications Current Outpatient Medications on File Prior to Visit  Medication Sig Dispense Refill  . loratadine (CLARITIN) 10 MG tablet Take 1 tablet (10 mg total) by mouth daily as needed for allergies. 30 tablet 5  . mirtazapine (REMERON) 7.5 MG tablet Take 1 tablet (7.5 mg total) by mouth at bedtime. 30 tablet 3  . mometasone (NASONEX) 50 MCG/ACT nasal spray Place 2 sprays into the nose daily. 17 g 12  . nortriptyline (PAMELOR) 10 MG capsule Start nortriptyline  at bedtime for 2 week, then increase to 2 tablet at bedtime (Patient not taking: Reported on 09/08/2017) 60 capsule 3  . temazepam (RESTORIL) 7.5 MG capsule Take 1 capsule (7.5 mg total) by mouth at bedtime as needed for sleep. (Patient not taking: Reported on 09/08/2017) 30 capsule 0  . traMADol (ULTRAM) 50 MG tablet One to two tablets at bedtime for pain 60 tablet 2   No current facility-administered medications on file prior to visit.      Past psychiatry history Outpatient: denies Psychiatry admission: denies Previous suicide attempt: denies Past trials of medication: mirtazapine History of violence: denies  Current measures Depression screen [facility] 2/9 09/08/2017 09/08/2017 02/19/2017 01/26/2014 10/19/2012  Decreased Interest 3 3 0 0 0  Down, Depressed, Hopeless 3 3 0 0 0  PHQ - 2 Score 6 6 0 0 0  Altered sleeping 3 3 0 - -  Tired, decreased energy 3 2 0 - -  Change in appetite 3 3 0 - -  Feeling bad or failure about yourself  3 3 0 - -  Trouble concentrating 1 3 0 - -   Moving slowly or fidgety/restless 1 3 0 - -  Suicidal thoughts 0 0 0 - -  PHQ-9 Score 20 23 0 - -  Difficult doing work/chores Extremely difficult Extremely difficult - - -   GAD 7 : Generalized Anxiety Score 09/08/2017  Nervous, Anxious, on Edge 2  Control/stop worrying 3  Worry too much - different things 1  Trouble relaxing 3  Restless 1  Easily annoyed or irritable 1  Afraid - awful might happen 3  Total GAD 7 Score 14  Anxiety Difficulty Very difficult    Goals (patient centered) Increase healthy adjustment to current life circumstances   Assessment/Provisional Diagnosis Adjustment disorder with depressed mood   Will continue mirtazapine to target neurovegetative symptoms. Will recommend slow up titration while monitoring potential adverse reaction of serotonin syndrome given patient was started on nortriptyline.   Recommendation - Continue mirtazapine 7.5 mg qhs. Consider slow uptitration in the future; targeted maximum dose 30-45 mg qhs as indicated.  - Patient is prescribed nortriptyline by neurologist, Noted that this medication can be also effective for depression.  - BH specialist to provide supportive therapy  Thank you for your consult. We will continue to follow the patient. Please contact [facility]  for any questions or concerns.   The above treatment considerations and suggestions are based on consultation with the [facility] specialist and/or PCP and a review of information available in the shared registry and the patient's Electronic Health Record (EHR).  I have not personally examined the patient. All recommendations should be implemented with consideration of the patient's relevant prior history and current clinical status. Please feel free to call me with any questions about the care of this patient.

## 2017-09-17 NOTE — Telephone Encounter (Signed)
This VBH specialist left message to call back with name and contact information. 

## 2017-09-18 ENCOUNTER — Encounter (HOSPITAL_COMMUNITY): Payer: BLUE CROSS/BLUE SHIELD | Admitting: Occupational Therapy

## 2017-09-19 ENCOUNTER — Encounter: Payer: Self-pay | Admitting: Clinical

## 2017-09-19 ENCOUNTER — Telehealth: Payer: Self-pay | Admitting: Clinical

## 2017-09-19 DIAGNOSIS — F329 Major depressive disorder, single episode, unspecified: Secondary | ICD-10-CM

## 2017-09-19 DIAGNOSIS — F32A Depression, unspecified: Secondary | ICD-10-CM

## 2017-09-19 NOTE — BH Specialist Note (Signed)
A user error has taken place: encounter opened in error, closed for administrative reasons.

## 2017-09-19 NOTE — BH Specialist Note (Signed)
[place] [facility] Telephone Follow-up  MRN: [id number] NAME: Nithin Demeo Date: 09/19/17  Start time: 4:39 End time: 4:50pm Total time: 11 min Call number: 4/6  Reason for call today:  Follow up due to acute depression from job loss   Goals, Interventions and Follow-up Plan Goals: Increase healthy adjustment to current life circumstances Interventions: Supportive Counseling Follow-up Plan: Weekly VBH follow up  Summary:  Mr. 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 He is also struggling to adjust to the financial loss and change in roles of not being the "bread winner" of the family. The loss of finances has made it difficult for them to afford daycare for the children and he reported it is a barrier for future job opportunities.  Mr. Faircloth was able to identify his wife as a strong support system and being able to spend time with his children.   Mr. Zimbelman reported he continues to take his medication which has helped with sleeping & appetite, although he still wakes up frequently.   Ixel Boehning Ed Blalock, LCSW

## 2017-09-19 NOTE — Telephone Encounter (Signed)
This VBH specialist left message to call back with name and contact information. 

## 2017-09-19 NOTE — Telephone Encounter (Signed)
A user error has taken place: encounter opened in error, closed for administrative reasons.

## 2017-09-23 ENCOUNTER — Ambulatory Visit (HOSPITAL_COMMUNITY): Payer: Self-pay | Admitting: Occupational Therapy

## 2017-09-23 ENCOUNTER — Telehealth (HOSPITAL_COMMUNITY): Payer: Self-pay | Admitting: Family Medicine

## 2017-09-23 NOTE — Telephone Encounter (Signed)
09/23/17  Pt had cx via the phone tree and I called to confirm that he did want to cancel and he said that he did.

## 2017-09-25 ENCOUNTER — Encounter (HOSPITAL_COMMUNITY): Payer: Self-pay

## 2017-09-25 ENCOUNTER — Other Ambulatory Visit: Payer: Self-pay

## 2017-09-25 ENCOUNTER — Telehealth: Payer: Self-pay | Admitting: Clinical

## 2017-09-25 ENCOUNTER — Ambulatory Visit (HOSPITAL_COMMUNITY): Payer: Self-pay | Attending: Neurology

## 2017-09-25 DIAGNOSIS — F329 Major depressive disorder, single episode, unspecified: Secondary | ICD-10-CM

## 2017-09-25 DIAGNOSIS — G5603 Carpal tunnel syndrome, bilateral upper limbs: Secondary | ICD-10-CM | POA: Insufficient documentation

## 2017-09-25 DIAGNOSIS — M25532 Pain in left wrist: Secondary | ICD-10-CM | POA: Insufficient documentation

## 2017-09-25 DIAGNOSIS — M25531 Pain in right wrist: Secondary | ICD-10-CM | POA: Insufficient documentation

## 2017-09-25 DIAGNOSIS — R29898 Other symptoms and signs involving the musculoskeletal system: Secondary | ICD-10-CM | POA: Insufficient documentation

## 2017-09-25 NOTE — Therapy (Signed)
[place], [place], [address] Phone: [phone]   Fax:  [phone]  Occupational Therapy Treatment Reassessment and discharge Patient Details  Name: Gabriel [name] MRN: [id number] Date of Birth: 1976/04/10 Referring Provider: Narda Amber, DO   Encounter Date: 09/25/2017  OT End of Session - 09/25/17 1109    Visit Number  3    Number of Visits  12    Authorization Type  New insurance card provided today. patient changed insurance from [place] to [facility] No visit limit $25 co pay    OT Start Time  1035 reassessment and discharge    OT Stop Time  1056    OT Time Calculation (min)  21 min    Activity Tolerance  Patient tolerated treatment well    Behavior During Therapy  WFL for tasks assessed/performed       Past Medical History:  Diagnosis Date  . Heart murmur   . Seasonal allergies     Past Surgical History:  Procedure Laterality Date  . NO PAST SURGERIES      There were no vitals filed for this visit.  Subjective Assessment - 09/25/17 1109    Subjective   S: The only time it really bothers me is at night.     Currently in Pain?  No/denies         [facility] OT Assessment - 09/25/17 1038      Assessment   Medical Diagnosis  Bilateral carpal tunnel syndrome      Precautions   Precautions  None      Strength   Strength Assessment Site  Forearm;Wrist;Hand    Right/Left Forearm  Left;Right    Right Forearm Pronation  5/5    Right Forearm Supination  5/5    Left Forearm Pronation  5/5    Left Forearm Supination  5/5    Right/Left Wrist  Right;Left    Right Wrist Flexion  5/5    Right Wrist Extension  5/5    Right Wrist Radial Deviation  5/5    Right Wrist Ulnar Deviation  5/5    Left Wrist Flexion  5/5    Left Wrist Extension  5/5    Left Wrist Radial Deviation  5/5    Left Wrist Ulnar Deviation  5/5    Right Hand Grip (lbs)  75 previous: 48    Right Hand Lateral Pinch  14 lbs previous: 9    Right Hand 3 Point Pinch  15 lbs previous: 7    Left Hand Grip (lbs)  63 previous 34    Left Hand Lateral Pinch  18 lbs previous: 9    Left Hand 3 Point Pinch  20 lbs previous: 6                       OT Education - 09/25/17 1116    Education provided  Yes    Education Details  reviewed HEP. Recommended that patient continue with present HEP. Upgraded theraputty to green. Discussed purchasing bilateral resting hand braces for nighttime and once returning to work.     Person(s) Educated  Patient    Methods  Explanation    Comprehension  Verbalized understanding       OT Short Term Goals - 09/25/17 1045      OT SHORT TERM GOAL #1   Title  Pt will be provided with and educated on HEP to improve B/IADL completion.     Time  3    Period  Weeks    Status  Achieved      OT SHORT TERM GOAL #2   Title  Pt will decrease pain in bilateral wrists to 5/10 to improve ability to sleep at night.     Time  3    Period  Weeks    Status  Not Met      OT SHORT TERM GOAL #3   Title  Pt will be educated on and provided with prefabricated or custom fabricated splints/braces for nighttime use to maintain neutral positioning for decreased pain.     Time  3    Period  Weeks    Status  Achieved      OT SHORT TERM GOAL #4   Title  Pt will increase bilateral grip strength by 12# and pinch strength by 3# to improve ability to perform meal preparation tasks.     Time  6    Period  Weeks    Status  Achieved        OT Long Term Goals - 09/25/17 1047      OT LONG TERM GOAL #1   Title  Pt will return to highest level of functioning during ADL tasks with bilateral hand use.     Baseline  5/16: not working at this time.     Time  6    Period  Weeks    Status  Achieved      OT LONG TERM GOAL #2   Title  Pt will decrease pain in bilateral wrists to 3/10 or less to improve ability to complete work tasks.     Time  6    Status  Not Met      OT LONG TERM GOAL #3   Title  Pt will  improve bilateral wrist strength to 4+/5 to increase ability to perform lifting/pulling tasks at work.     Time  6    Period  Weeks    Status  Achieved      OT LONG TERM GOAL #4   Title  Pt will improve bilateral grip strength by 25# and pinch strength by 6# to improve ability to lift and carry objects.     Time  6    Status  Achieved            Plan - 09/25/17 1110    Clinical Impression Statement  A: Pt has been absent from therapy for 1 month. Reassessment completed today to assess any progress in therapy. Pt reports that he has been completing his HEP. He is experiencing discomfort and intense tingling mainly at night in which he will rate the pain at 7/10. Otherwise, patient reports minimal discomfort during the day . He is currently not working although actively looking for a new job. Pt has met all short and long term goals with the exception of his pain goal. Pt's strength in Bilateral wrist and forearms are 5/5. His grip and pinch strength in BUE have increased significantly and is now at or above normal range for his age. Discharge is recommended with patient encouraged to purchase bilateral resting hand braces for nightime comfort. Also encouraged to purchase braces for work once he returns as his previous job required lifting continously 50# overhead with both arms and he will be looking to apply to jobs of the same nature. patient is in agreement with discharge recommendation.     Plan  P: D/C from therapy with HEP. Follow up with  MD.    Consulted and Agree with Plan of Care  Patient       Patient will benefit from skilled therapeutic intervention in order to improve the following deficits and impairments:  Impaired sensation, Decreased range of motion, Pain, Decreased activity tolerance, Decreased strength, Impaired UE functional use  Visit Diagnosis: Carpal tunnel syndrome, bilateral  Pain in right wrist  Pain in left wrist  Other symptoms and signs involving the  musculoskeletal system  Wrist weakness    Problem List Patient Active Problem List   Diagnosis Date Noted  . Acute depression 09/08/2017  . Insomnia 07/11/2017  . Chronic hand pain 07/10/2017  . Numbness and tingling of both upper extremities while sleeping 02/21/2016  . Work related injury 02/21/2016  . Sternal pain 09/14/2015  . IGT (impaired glucose tolerance) 10/31/2012  . Perennial allergic rhinitis 10/19/2012  . Atopic eczema 10/19/2012   OCCUPATIONAL THERAPY DISCHARGE SUMMARY  Visits from Start of Care: 3  Current functional level related to goals / functional outcomes: See above   Remaining deficits: See above   Education / Equipment: See above Plan: Patient agrees to discharge.  Patient goals were met. Patient is being discharged due to meeting the stated rehab goals.  ?????         Ailene Ravel, OTR/L,CBIS  [phone]  09/25/2017, 11:17 AM  [place], [place], [address] Phone: [phone]   Fax:  [phone]  Name: Wadsworth Skolnick MRN: [id number] Date of Birth: May 10, 1976

## 2017-09-25 NOTE — BH Specialist Note (Signed)
[place] [facility] Telephone Follow-up  MRN: [id number] NAME: Gabriel [name] Date: 09/25/17  Start time: 1:20pm End time: 1:30pm Total time: 10 min Call number: 5/6  Reason for call today: Reason for Contact: PHQ9-4 weeks  PHQ-9 Scores:  Depression screen [facility] 2/9 09/25/2017 09/08/2017 09/08/2017 02/19/2017 01/26/2014  Decreased Interest 0 0  Down, Depressed, Hopeless 0 0  PHQ - 2 Score 0 0  Altered sleeping 0 -  Tired, decreased energy 0 -  Change in appetite 0 -  Feeling bad or failure about yourself  0 -  Trouble concentrating 0 -  Moving slowly or fidgety/restless 0 1 3 0 -  Suicidal thoughts 0 0 0 0 -  PHQ-9 Score 0 -  Difficult doing work/chores Somewhat difficult Extremely dIfficult Extremely dIfficult - -   GAD-7 Scores:  GAD 7 : Generalized Anxiety Score 09/08/2017  Nervous, Anxious, on Edge 2  Control/stop worrying 3  Worry too much - different things 1  Trouble relaxing 3  Restless 1  Easily annoyed or irritable 1  Afraid - awful might happen 3  Total GAD 7 Score 14  Anxiety Difficulty Very difficult    Stress Current stressors: Current Stressors: Actuary, Job loss/unemployment, Other (Comment)(Had to take kids out of daycare due to finance) Sleep: Sleep: Difficulty falling asleep(Improved a little bit) Appetite: Appetite: Other (Comment)(Still not normal) Coping ability: Coping ability: Resilient Patient taking medications as prescribed: Patient taking medications as prescribed: Yes  Current medications:  Outpatient Encounter Medications as of 09/25/2017  Medication Sig  . loratadine (CLARITIN) 10 MG tablet Take 1 tablet (10 mg total) by mouth daily as needed for allergies.  . mirtazapine (REMERON) 7.5 MG tablet Take 1 tablet (7.5 mg total) by mouth at bedtime.  . mometasone (NASONEX) 50 MCG/ACT nasal spray Place 2 sprays into the nose daily.  . nortriptyline (PAMELOR) 10 MG capsule Start  nortriptyline  at bedtime for 2 week, then increase to 2 tablet at bedtime (Patient not taking: Reported on 09/08/2017)  . temazepam (RESTORIL) 7.5 MG capsule Take 1 capsule (7.5 mg total) by mouth at bedtime as needed for sleep. (Patient not taking: Reported on 09/08/2017)  . traMADol (ULTRAM) 50 MG tablet One to two tablets at bedtime for pain   No facility-administered encounter medications on file as of 09/25/2017.      Self-harm Behaviors Risk Assessment Self-harm risk factors:   Patient endorses recent thoughts of harming self: Have you recently had any thoughts about harming yourself?: No  [place] Suicide Severity Rating Scale: No flowsheet data found. No flowsheet data found.   Danger to Others Risk Assessment Danger to others risk factors: Danger to Others Risk Factors: No risk factors noted Patient endorses recent thoughts of harming others: Notification required: No need or identified person    Goals, Interventions and Follow-up Plan Goals: Increase healthy adjustment to current life circumstances Interventions: Supportive Counseling and Medication Monitoring   Summary:  [name] reported ongoing severe depressive symptoms due to job loss and change in finances.  He reported supportive spouse.  He continues to take his medications as prescribed with no side effects.   Follow-up Plan: Ongoing VBH check in weekly  Gordy Savers, LCSW

## 2017-10-01 ENCOUNTER — Telehealth: Payer: Self-pay | Admitting: Clinical

## 2017-10-01 NOTE — Telephone Encounter (Signed)
This VBH specialist left message to call back with name and contact information. 

## 2017-10-07 MED FILL — MIRTAZAPINE 7.5 MG TABLET: 7.5 | 30 days supply | Qty: 30 | Fill #0

## 2017-10-20 ENCOUNTER — Telehealth: Payer: Self-pay

## 2017-10-20 NOTE — Telephone Encounter (Signed)
VBH - left msg

## 2017-10-23 ENCOUNTER — Telehealth: Payer: Self-pay

## 2017-10-23 DIAGNOSIS — F321 Major depressive disorder, single episode, moderate: Secondary | ICD-10-CM

## 2017-10-24 ENCOUNTER — Telehealth: Payer: Self-pay

## 2017-10-24 DIAGNOSIS — F321 Major depressive disorder, single episode, moderate: Secondary | ICD-10-CM

## 2017-10-24 NOTE — BH Specialist Note (Signed)
[facility] [facility] Telephone Follow-up  MRN: [id number] NAME: Gabriel [name] Date: 11-07-17   Total time: 15 minutes Call number: 3/6  Reason for call today: Reason for Contact: PHQ9-4 weeks  PHQ-9 Scores:  Depression screen [facility] 2/9 11-07-2017 09/25/2017 09/08/2017 09/08/2017 02/19/2017  Decreased Interest 3 3 3 3  0  Down, Depressed, Hopeless 2 3 3 3  0  PHQ - 2 Score 5 6 6 6  0  Altered sleeping 1 2 3 3  0  Tired, decreased energy 2 3 3 2  0  Change in appetite 1 3 3 3  0  Feeling bad or failure about yourself  3 3 3 3  0  Trouble concentrating 1 3 1 3  0  Moving slowly or fidgety/restless 0 0 1 3 0  Suicidal thoughts 0 0 0 0 0  PHQ-9 Score 13 20 20 23  0  Difficult doing work/chores Somewhat difficult Somewhat difficult Extremely dIfficult Extremely dIfficult -     GAD-7 Scores:  GAD 7 : Generalized Anxiety Score 11-07-17 09/08/2017  Nervous, Anxious, on Edge 2 2  Control/stop worrying 2 3  Worry too much - different things 1 1  Trouble relaxing 2 3  Restless 1 1  Easily annoyed or irritable 1 1  Afraid - awful might happen 2 3  Total GAD 7 Score 11 14  Anxiety Difficulty Somewhat difficult Very difficult    Stress Current stressors: Current Stressors: Job loss/unemployment, Finances Sleep: Sleep: Difficulty falling asleep, Difficulty staying asleep Appetite: Appetite: No problems Coping ability: Coping ability: Resilient  Patient taking medications as prescribed: Patient taking medications as prescribed: Yes  Current medications:  Outpatient Encounter Medications as of 11/07/2017  Medication Sig  . loratadine (CLARITIN) 10 MG tablet Take 1 tablet (10 mg total) by mouth daily as needed for allergies.  . mirtazapine (REMERON) 7.5 MG tablet Take 1 tablet (7.5 mg total) by mouth at bedtime.  . mometasone (NASONEX) 50 MCG/ACT nasal spray Place 2 sprays into the nose daily.  . nortriptyline (PAMELOR) 10 MG capsule Start nortriptyline 10mg  at bedtime for 2 week, then  increase to 2 tablet at bedtime (Patient not taking: Reported on 09/08/2017)  . temazepam (RESTORIL) 7.5 MG capsule Take 1 capsule (7.5 mg total) by mouth at bedtime as needed for sleep. (Patient not taking: Reported on 09/08/2017)  . traMADol (ULTRAM) 50 MG tablet One to two tablets at bedtime for pain   No facility-administered encounter medications on file as of 11-07-2017.      Self-harm Behaviors Risk Assessment Self-harm risk factors: Self-harm risk factors: (None Reported) Patient endorses recent thoughts of harming self: Have you recently had any thoughts about harming yourself?: No  [place] Suicide Severity Rating Scale: No flowsheet data found. C-SRSS November 07, 2017  1. Wish to be Dead No  2. Suicidal Thoughts No  6. Suicide Behavior Question No     Danger to Others Risk Assessment Danger to others risk factors: Danger to Others Risk Factors: No risk factors noted Patient endorses recent thoughts of harming others: Notification required: No need or identified person    Substance Use Assessment Patient recently consumed alcohol: Have you recently consumed alcohol?: No  Alcohol Use Disorder Identification Test (AUDIT):  Alcohol Use Disorder Test (AUDIT) 2017-11-07  1. How often do you have a drink containing alcohol? 0  2. How many drinks containing alcohol do you have on a typical day when you are drinking? 0  3. How often do you have six or more drinks on one occasion? 0  AUDIT-C Score  0  Intervention/Follow-up AUDIT Score <7 follow-up not indicated   Patient recently used drugs: Have you recently used any drugs?: No    Goals, Interventions and Follow-up Plan Goals: Increase healthy adjustment to current life circumstances Interventions: Motivational Interviewing, Solution-Focused Strategies, Mindfulness or Management consultantelaxation Training, Mining engineerBehavioral Activation, Brief CBT and Supportive Counseling Follow-up Plan: Connect with [facility] resources ( shelter, financial assistance, food  sources, community activities) [facility] Phone Follow Up;  Patient will journal his  Feelings;n Patient will create a list of places that he would like to apply to; Patient will crete a linked in account in order to open up opportunites to network.   Summary:   Patient reports sustained depression and anxiety.  Patient reports that he fells, "as if the medication works some times and some times it does not work.  Patient denies any side effects with the medication.    Patient reports that he employer is fighting his employment claim stating that he quit instead of being fired.  Patient reports that he feels very anxious when he thinks about the bills that need to be paid and the child care expenses if he goes back to work and he is not paid as much as he was making at his previous job.  Patient reports having a slight issue with staying asleep but denies any issues with his appetite.       Phillip HealStevenson, Geramy Lamorte LaVerne, LCAS-A

## 2017-10-24 NOTE — BH Specialist Note (Signed)
[facility] - Writer left message

## 2017-10-29 ENCOUNTER — Encounter (HOSPITAL_COMMUNITY): Payer: Self-pay | Admitting: Psychiatry

## 2017-10-29 NOTE — Progress Notes (Signed)
Please refer to recommendation documented on 09/08/2017 if any interest in medication change.

## 2017-10-30 NOTE — Patient Outreach (Signed)
Error in charting.

## 2017-10-31 NOTE — Telephone Encounter (Signed)
This encounter was created in error - please disregard.

## 2017-11-07 MED FILL — MIRTAZAPINE 7.5 MG TABLET: 7.5 | 30 days supply | Qty: 30 | Fill #1

## 2017-12-04 ENCOUNTER — Telehealth: Payer: Self-pay

## 2017-12-04 ENCOUNTER — Encounter: Payer: Self-pay | Admitting: Family Medicine

## 2017-12-04 ENCOUNTER — Telehealth: Payer: Self-pay | Admitting: Family Medicine

## 2017-12-04 DIAGNOSIS — F321 Major depressive disorder, single episode, moderate: Secondary | ICD-10-CM

## 2017-12-04 NOTE — Telephone Encounter (Signed)
Patient reports that he has not been able to sleep through the night since last Sunday.   Writer routed this information to Dr Vanetta ShawlHisada and his PCP (Dr. Lodema HongSimpson).  Writer informed the nurse working with Dr Lodema HongSimpson.

## 2017-12-04 NOTE — Telephone Encounter (Signed)
[facility] called and said Gabriel [name] is having problems sleeping and wanted you to know.
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
## 2017-12-04 NOTE — Telephone Encounter (Signed)
Noted and tried to spk with pt and have sent a My chart message re medication managemet option

## 2017-12-05 ENCOUNTER — Other Ambulatory Visit: Payer: Self-pay | Admitting: Family Medicine

## 2017-12-05 MED ORDER — TEMAZEPAM 15 MG PO CAPS: 15.0000 mg | ORAL_CAPSULE | Freq: Every day | ORAL | 0 refills | Status: DC

## 2017-12-05 MED FILL — TEMAZEPAM 15 MG CAPSULE: 15 | 60 days supply | Qty: 60 | Fill #0

## 2017-12-08 MED FILL — TRIAMCINOLONE 0.025% OINT: 0.025 | 15 days supply | Qty: 80 | Fill #0

## 2017-12-10 NOTE — Progress Notes (Signed)
Reviewed case. Please refer to recommendation documented on 09/08/2017 if any interest in medication change (noted that mirtazapine 15-30 mg at night could be more beneficial for insomnia as well)

## 2017-12-12 ENCOUNTER — Ambulatory Visit: Payer: BLUE CROSS/BLUE SHIELD | Admitting: Neurology

## 2017-12-19 ENCOUNTER — Telehealth: Payer: Self-pay

## 2017-12-19 NOTE — Telephone Encounter (Signed)
[facility] - Left Msg 

## 2018-01-08 ENCOUNTER — Telehealth: Payer: Self-pay

## 2018-01-08 ENCOUNTER — Other Ambulatory Visit: Payer: Self-pay | Admitting: Family Medicine

## 2018-01-08 DIAGNOSIS — F321 Major depressive disorder, single episode, moderate: Secondary | ICD-10-CM

## 2018-01-08 NOTE — BH Specialist Note (Signed)
[facility] Telephone Follow-up  MRN: [id number] NAME: Gabriel [name] Date: 01/08/18   Total time: 30 minutes Call number: 4/6  Reason for call today: Reason for Contact: PHQ9-8 weeks  PHQ-9 Scores:  [facility] 01/08/2018 12/04/2017 10/24/2017 09/25/2017 09/08/2017  Decreased Interest 2 3 3 3 3   Down, Depressed, Hopeless 2 2 2 3 3   PHQ - 2 Score 4 5 5 6 6   Altered sleeping 1 3 1 2 3   Tired, decreased energy 2 2 2 3 3   Change in appetite 0 2 1 3 3   Feeling bad or failure about yourself  3 2 3 3 3   Trouble concentrating 1 1 1 3 1   Moving slowly or fidgety/restless 0 1 0 0 1  Suicidal thoughts 0 0 0 0 0  PHQ-9 Score 11 16 13 20 20   Difficult doing work/chores Somewhat difficult Very difficult Somewhat difficult Somewhat difficult Extremely dIfficult     GAD-7 Scores:  GAD 7 : Generalized Anxiety Score 01/08/2018 12/04/2017 10/24/2017 09/08/2017  Nervous, Anxious, on Edge 1 3 2 2   Control/stop worrying 2 3 2 3   Worry too much - different things 2 3 1 1   Trouble relaxing 2 2 2 3   Restless 1 1 1 1   Easily annoyed or irritable 0 1 1 1   Afraid - awful might happen 2 2 2 3   Total GAD 7 Score 10 15 11 14   Anxiety Difficulty Somewhat difficult Very difficult Somewhat difficult Very difficult    Stress Current stressors: Current Stressors: Finances, Job loss/unemployment Sleep: Sleep: No problems Appetite: Appetite: Decreased Coping ability: Coping ability: Exhausted, Overwhelmed  Patient taking medications as prescribed: Patient taking medications as prescribed: Yes  Current medications:  Outpatient Encounter Medications as of 01/08/2018  Medication Sig  . loratadine (CLARITIN) 10 MG tablet Take 1 tablet (10 mg total) by mouth daily as needed for allergies.  . mirtazapine (REMERON) 7.5 MG tablet Take 1 tablet (7.5 mg total) by mouth at bedtime.  . mometasone (NASONEX) 50 MCG/ACT nasal spray Place 2 sprays into the nose daily.  . nortriptyline (PAMELOR) 10  MG capsule Start nortriptyline 10mg  at bedtime for 2 week, then increase to 2 tablet at bedtime (Patient not taking: Reported on 09/08/2017)  . temazepam (RESTORIL) 15 MG capsule Take 1 capsule (15 mg total) by mouth at bedtime.  . traMADol (ULTRAM) 50 MG tablet One to two tablets at bedtime for pain   No facility-administered encounter medications on file as of 01/08/2018.      Self-harm Behaviors Risk Assessment Self-harm risk factors: Self-harm risk factors: (None Reported) Patient endorses recent thoughts of harming self: Have you recently had any thoughts about harming yourself?: No  [place] Suicide Severity Rating Scale: No flowsheet data found. C-SRSS 10/24/2017 12/04/2017 12/04/2017 01/08/2018  1. Wish to be Dead No No No No  2. Suicidal Thoughts No No No No  6. Suicide Behavior Question No No No No     Danger to Others Risk Assessment Danger to others risk factors: Danger to Others Risk Factors: No risk factors noted Patient endorses recent thoughts of harming others: Notification required: No need or identified person    Substance Use Assessment Patient recently consumed alcohol:    Alcohol Use Disorder Identification Test (AUDIT):  Alcohol Use Disorder Test (AUDIT) 10/24/2017 12/04/2017 01/08/2018  1. How often do you have a drink containing alcohol? 0 0 0  2. How many drinks containing alcohol do you have on a typical day when you  are drinking? 0 0 0  3. How often do you have six or more drinks on one occasion? 0 0 0  AUDIT-C Score 0 0 0  Intervention/Follow-up AUDIT Score <7 follow-up not indicated AUDIT Score <7 follow-up not indicated AUDIT Score <7 follow-up not indicated   Patient recently used drugs:  No    Goals, Interventions and Follow-up Plan Goals: Increase healthy adjustment to current life circumstances Interventions: Supportive Counseling Follow-up Plan: Refer to [facility] Outpatient Therapy  Summary:   Follow Up - Patient reports improved mood and an ability to  sleep since the change in medication.  Patient has a decrease in his PHQ and GAD score.   Patient continues to have racing thoughts because he is not able to obtain gainful employment.  Patient reports that his wife has been taking care of the bills and he is staying at home with the children.  Patient reports that his confidence in himself is all gone. Patient reports attempting mindfulness expertises and journaling but they are not helping his situation..    Patient requests a face to face session with a therapist.  Writer scheduled an appt with a therapist in [place] on 01-20-2018.     Patient denies SI/HI/Psychosis/Substance Abuse. If your symptoms worsen or you have thoughts of suicide/homicide, PLEASE SEEK IMMEDIATE MEDICAL ATTENTION.  You may always call:  National Suicide Hotline: 469-304-6061;  [place] Crisis Line: 442 034 8631;  Crisis Recovery in [place]: 404-099-0641.  These are available 24 hours a day, 7 days a week.   Phillip Heal LaVerne, LCAS-A

## 2018-01-14 ENCOUNTER — Encounter: Payer: Self-pay | Admitting: Neurology

## 2018-01-14 ENCOUNTER — Ambulatory Visit: Payer: 59 | Admitting: Neurology

## 2018-01-14 VITALS — BP 100/78 | HR 78 | Ht 72.0 in | Wt 213.0 lb

## 2018-01-14 DIAGNOSIS — G5603 Carpal tunnel syndrome, bilateral upper limbs: Secondary | ICD-10-CM | POA: Diagnosis not present

## 2018-01-14 DIAGNOSIS — R29898 Other symptoms and signs involving the musculoskeletal system: Secondary | ICD-10-CM

## 2018-01-14 NOTE — Patient Instructions (Signed)
Start using wrist splints during the night and when you are doing activities during the day  If there is no improvement in 3 months, please call my office and we will refer you to hand specialists.
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
## 2018-01-14 NOTE — Progress Notes (Signed)
He is referred to therapy. No change in medication recommendation. vBHi will sign off at this time.

## 2018-01-15 DIAGNOSIS — L219 Seborrheic dermatitis, unspecified: Secondary | ICD-10-CM | POA: Diagnosis not present

## 2018-01-15 DIAGNOSIS — L700 Acne vulgaris: Secondary | ICD-10-CM | POA: Insufficient documentation

## 2018-01-15 DIAGNOSIS — L2084 Intrinsic (allergic) eczema: Secondary | ICD-10-CM | POA: Diagnosis not present

## 2018-01-15 DIAGNOSIS — T148XXA Other injury of unspecified body region, initial encounter: Secondary | ICD-10-CM | POA: Insufficient documentation

## 2018-01-15 MED FILL — HYDROCORTISONE 2.5% CREAM: 2.5 | 30 days supply | Qty: 30 | Fill #0

## 2018-01-15 MED FILL — KETOCONAZOLE 2% SHAMPOO: 2 | 30 days supply | Qty: 120 | Fill #0

## 2018-01-15 MED FILL — TRIAMCINOLONE 0.1% OINTMENT: 0.1 | 30 days supply | Qty: 80 | Fill #0

## 2018-01-20 ENCOUNTER — Ambulatory Visit (HOSPITAL_COMMUNITY): Payer: 59 | Admitting: Psychiatry

## 2018-01-20 DIAGNOSIS — F321 Major depressive disorder, single episode, moderate: Secondary | ICD-10-CM

## 2018-01-20 NOTE — Progress Notes (Signed)
Comprehensive Clinical Assessment (CCA) Note  01/20/2018 Gabriel [name] [id number]  Visit Diagnosis:      ICD-10-CM   1. Current moderate episode of major depressive disorder without prior episode (HCC) F32.1       CCA Part One  Part One has been completed on paper by the patient.  (See scanned document in Chart Review)  CCA Part Two A  Intake/Chief Complaint:  CCA Intake With Chief Complaint CCA Part Two Date: 01/20/18 Chief Complaint/Presenting Problem: anxiety and depression Patients Currently Reported Symptoms/Problems: difficult sleeping; mind racing; was having issues with appetite, but still comes and goes Collateral Involvement: none Type of Services Patient Feels Are Needed: med management and therapy  Mental Health Symptoms Depression:  Depression: Change in energy/activity, Difficulty Concentrating, Increase/decrease in appetite, Irritability, Tearfulness  Mania:     Anxiety:   Anxiety: Difficulty concentrating, Irritability, Restlessness, Sleep, Worrying(4-5 hours with medication)  Psychosis:  Psychosis: N/A  Trauma:  Trauma: N/A  Obsessions:  Obsessions: N/A  Compulsions:  Compulsions: N/A  Inattention:  Inattention: N/A  Hyperactivity/Impulsivity:  Hyperactivity/Impulsivity: N/A  Oppositional/Defiant Behaviors:  Oppositional/Defiant Behaviors: N/A  Borderline Personality:  Emotional Irregularity: N/A  Other Mood/Personality Symptoms:      Mental Status Exam Appearance and self-care  Stature:  Stature: Tall  Weight:  Weight: Average weight  Clothing:  Clothing: Casual  Grooming:  Grooming: Normal  Cosmetic use:  Cosmetic Use: Age appropriate  Posture/gait:  Posture/Gait: Normal  Motor activity:  Motor Activity: Not Remarkable  Sensorium  Attention:  Attention: Normal  Concentration:  Concentration: Normal  Orientation:  Orientation: X5  Recall/memory:  Recall/Memory: Normal  Affect and Mood  Affect:  Affect: Depressed, Appropriate  Mood:  Mood:  Depressed  Relating  Eye contact:  Eye Contact: Normal  Facial expression:  Facial Expression: Depressed  Attitude toward examiner:  Attitude Toward Examiner: Cooperative  Thought and Language  Speech flow: Speech Flow: Normal  Thought content:  Thought Content: Appropriate to mood and circumstances  Preoccupation:     Hallucinations:     Organization:     Company secretary of Knowledge:  Fund of Knowledge: Average  Intelligence:  Intelligence: Average  Abstraction:  Abstraction: Functional  Judgement:  Judgement: Normal  Reality Testing:  Reality Testing: Realistic  Insight:  Insight: Good  Decision Making:  Decision Making: Normal  Social Functioning  Social Maturity:  Social Maturity: Isolates  Social Judgement:  Social Judgement: Normal  Stress  Stressors:  Stressors: Work, Transitions, Scientist, forensic Ability:  Coping Ability: Building surveyor Deficits:     Supports:      Family and Psychosocial History: Family history Marital status: Married Number of Years Married: 9 What types of issues is patient dealing with in the relationship?: none Are you sexually active?: Yes What is your sexual orientation?: heterosexual Does patient have children?: Yes How many children?: 2 How is patient's relationship with their children?: 5 years and 8 years  Childhood History:  Childhood History By whom was/is the patient raised?: Mother Description of patient's relationship with caregiver when they were a child: good Patient's description of current relationship with people who raised him/her: none Does patient have siblings?: Yes Description of patient's current relationship with siblings: 1/2 sisters and brothers Did patient suffer any verbal/emotional/physical/sexual abuse as a child?: No Did patient suffer from severe childhood neglect?: No Has patient ever been sexually abused/assaulted/raped as an adolescent or adult?: No Was the patient ever a victim of a crime or  a disaster?: No  Witnessed domestic violence?: No Has patient been effected by domestic violence as an adult?: No  CCA Part Two B  Employment/Work Situation: Employment / Work Psychologist, occupational Employment situation: Unemployed What is the longest time patient has a held a job?: 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?: No Are There Guns or Other Weapons in Your Home?: No Are These Comptroller?: Yes  Education: Education Last Grade Completed: 12 Name of High School: Manufacturing engineer [employer] Did Garment/textile technologist From [employer]?: Yes Did Theme park manager?: No Did Designer, television/film set?: No Did You Have Any Special Interests In School?: none Did You Have An Individualized Education Program (IIEP): No Did You Have Any Difficulty At School?: No  Religion: Religion/Spirituality Are You A Religious Person?: Yes What is Your Religious Affiliation?: Environmental consultant: Leisure / Recreation Leisure and Hobbies: likes to read; play basketball  Exercise/Diet: Exercise/Diet Do You Exercise?: No Have You Gained or Lost A Significant Amount of Weight in the Past Six Months?: No Do You Follow a Special Diet?: No Do You Have Any Trouble Sleeping?: Yes Explanation of Sleeping Difficulties: sleeping 4-5 hours interrupted with medication  CCA Part Two C  Alcohol/Drug Use: Alcohol / Drug Use Pain Medications: none Over the Counter: tylenol History of alcohol / drug use?: No history of alcohol / drug abuse                      CCA Part Three  ASAM's:  Six Dimensions of Multidimensional Assessment  Dimension 1:  Acute Intoxication and/or Withdrawal Potential:     Dimension 2:  Biomedical Conditions and Complications:     Dimension 3:  Emotional, Behavioral, or Cognitive Conditions and Complications:     Dimension 4:  Readiness to Change:      Dimension 5:  Relapse, Continued use, or Continued Problem Potential:     Dimension 6:  Recovery/Living Environment:      Substance use Disorder (SUD)    Social Function:  Social Functioning Social Maturity: Isolates Social Judgement: Normal  Stress:  Stress Stressors: Work, Transitions, Dance movement psychotherapist Ability: Overwhelmed Patient Takes Medications The Way The Doctor Instructed?: Yes Priority Risk: Low Acuity  Risk Assessment- Self-Harm Potential: Risk Assessment For Self-Harm Potential Thoughts of Self-Harm: No current thoughts Method: No plan Availability of Means: No access/NA  Risk Assessment -Dangerous to Others Potential: Risk Assessment For Dangerous to Others Potential Method: No Plan Availability of Means: No access or NA Intent: Vague intent or NA Notification Required: No need or identified person  DSM5 Diagnoses: Patient Active Problem List   Diagnosis Date Noted  . Bilateral carpal tunnel syndrome 01/14/2018  . Acute depression 09/08/2017  . Insomnia 07/11/2017  . Chronic hand pain 07/10/2017  . Work related injury 02/21/2016  . Sternal pain 09/14/2015  . IGT (impaired glucose tolerance) 10/31/2012  . Perennial allergic rhinitis 10/19/2012  . Atopic eczema 10/19/2012    Patient Centered Plan: Patient is on the following Treatment Plan(s):  Pt. Referred to [facility] for ongoing therapy.  Recommendations for Services/Supports/Treatments: Recommendations for Services/Supports/Treatments Recommendations For Services/Supports/Treatments: IOP (Intensive Outpatient Program), Medication Management, Individual Therapy  Treatment Plan Summary: Pt. Referred to [facility] for ongoing therapy.    Referrals to Alternative Service(s): Referred to Alternative Service(s):   Place:   Date:   Time:    Referred to Alternative Service(s):  Place:   Date:   Time:    Referred to Alternative Service(s):   Place:   Date:   Time:    Referred to Alternative Service(s):    Place:   Date:   Time:     Shaune Pollack

## 2018-01-21 ENCOUNTER — Telehealth: Payer: Self-pay

## 2018-01-21 NOTE — Telephone Encounter (Signed)
Patient completed appt iwith Nita Sells.  Patient placed on the inactive list.

## 2018-01-28 ENCOUNTER — Ambulatory Visit (INDEPENDENT_AMBULATORY_CARE_PROVIDER_SITE_OTHER): Payer: 59 | Admitting: Family Medicine

## 2018-01-28 ENCOUNTER — Encounter: Payer: Self-pay | Admitting: Family Medicine

## 2018-01-28 VITALS — BP 108/78 | HR 61 | Resp 15 | Ht 72.0 in | Wt 210.0 lb

## 2018-01-28 DIAGNOSIS — E559 Vitamin D deficiency, unspecified: Secondary | ICD-10-CM | POA: Diagnosis not present

## 2018-01-28 DIAGNOSIS — Z13220 Encounter for screening for lipoid disorders: Secondary | ICD-10-CM

## 2018-01-28 DIAGNOSIS — F5104 Psychophysiologic insomnia: Secondary | ICD-10-CM

## 2018-01-28 DIAGNOSIS — J3089 Other allergic rhinitis: Secondary | ICD-10-CM

## 2018-01-28 DIAGNOSIS — F419 Anxiety disorder, unspecified: Secondary | ICD-10-CM

## 2018-01-28 DIAGNOSIS — Z125 Encounter for screening for malignant neoplasm of prostate: Secondary | ICD-10-CM | POA: Diagnosis not present

## 2018-01-28 DIAGNOSIS — F322 Major depressive disorder, single episode, severe without psychotic features: Secondary | ICD-10-CM | POA: Diagnosis not present

## 2018-01-28 DIAGNOSIS — E663 Overweight: Secondary | ICD-10-CM

## 2018-01-28 DIAGNOSIS — R7301 Impaired fasting glucose: Secondary | ICD-10-CM

## 2018-01-28 MED ORDER — FLUOXETINE HCL 20 MG PO TABS: 20.0000 mg | ORAL_TABLET | Freq: Every day | ORAL | 1 refills | Status: DC

## 2018-01-28 MED ORDER — ERGOCALCIFEROL 1.25 MG (50000 UT) PO CAPS: 50000.0000 [IU] | ORAL_CAPSULE | ORAL | 1 refills | Status: DC

## 2018-01-28 MED ORDER — MIRTAZAPINE 7.5 MG PO TABS: 7.5000 mg | ORAL_TABLET | Freq: Every day | ORAL | 5 refills | Status: DC

## 2018-01-28 MED FILL — VIT D2 1.25 MG (50,000 UNIT: 1.25 MG | 84 days supply | Qty: 12 | Fill #0

## 2018-01-28 MED FILL — FLUoxetine HCL 20 MG TABS: 20 | 90 days supply | Qty: 90 | Fill #0

## 2018-01-28 MED FILL — MIRTAZAPINE 7.5 MG TABLET: 7.5 | 30 days supply | Qty: 30 | Fill #0

## 2018-01-28 NOTE — Patient Instructions (Signed)
F/u in 6 weeks, call if you need me before  Strat fluoxetine 20 daily for anxiety, and depression  Good that you are starting face to face therapy,I do believe you will benefit from Psychiatry for med management , we will see  Please commit to satring 2 30 minutes of exercise that you enjoy every day  Fasting ;lipid, cmp and EGFR and CBC and PSA ln 2 weeks please  We want you to get better and you WILL         

## 2018-01-29 ENCOUNTER — Encounter: Payer: Self-pay | Admitting: Family Medicine

## 2018-01-29 DIAGNOSIS — E559 Vitamin D deficiency, unspecified: Secondary | ICD-10-CM | POA: Insufficient documentation

## 2018-01-29 DIAGNOSIS — F322 Major depressive disorder, single episode, severe without psychotic features: Secondary | ICD-10-CM | POA: Insufficient documentation

## 2018-01-29 DIAGNOSIS — F419 Anxiety disorder, unspecified: Secondary | ICD-10-CM | POA: Insufficient documentation

## 2018-01-29 MED ORDER — MOMETASONE FUROATE 50 MCG/ACT NA SUSP: 2.0000 | Freq: Every day | NASAL | 2 refills | Status: DC

## 2018-01-29 MED ORDER — LORATADINE 10 MG PO TABS: 10.0000 mg | ORAL_TABLET | Freq: Every day | ORAL | 1 refills | Status: DC

## 2018-01-29 MED FILL — MOMETASONE FUROATE 50 MCG S: 50 | 60 days supply | Qty: 34 | Fill #0

## 2018-01-29 MED FILL — TRETINOIN 0.025% CREAM: 0.025 | 30 days supply | Qty: 45 | Fill #0

## 2018-01-29 NOTE — Assessment & Plan Note (Signed)
Sleep hygiene reviewed and written information offered also. Prescription sent for  medication needed.at increased dose of 15 mg , advised execise commitment to improve sleep

## 2018-01-29 NOTE — Assessment & Plan Note (Signed)
Needs treatment , once weekly vit D prescribed

## 2018-01-29 NOTE — Assessment & Plan Note (Addendum)
Start fluoxetine 20 mg daily and face to face  Therapy, f/u in 6 weeks Srart/ commit to daily exercise If no significant improvement , needs treatment by Psychiatry, which I have already tried to convince him of on the day of the visit , but he is wanting to hold off now and see how he does

## 2018-01-29 NOTE — Progress Notes (Signed)
Gabriel [name]     MRN: [id number]      DOB: 1975/12/29   HPI Gabriel [name] is here for follow up and re-evaluation of depression and anxiety. He has found his tele psych session extremely useful, and has an appointment with direct therapy next month, which he needs. Symptoms of depression and anxiety have not improved much nor has his sleep. He is trying to get a new job, but his current state of health makes this unlikely as he is still very depressed and anxious. I have strongly encouraged him to start treatment through Psychiatry, he is resistant , statin he will start with medication and face to face therapy and is to return in 6 weeks . He does not exercise but he does take his children to sports events , he does not cry as much but remains very anxious Sleep is still poor , despite recent increase in medication dose , reports sleeeping on average 5 hours/ night He is not suicidal or homicidal  ROS Denies recent fever or chills. Denies sinus pressure, nasal congestion, ear pain or sore throat. Denies chest congestion, productive cough or wheezing. Denies chest pains, palpitations and leg swelling Denies abdominal pain, nausea, vomiting,diarrhea or constipation.   Denies dysuria, frequency, hesitancy or incontinence. Denies joint pain, swelling and limitation in mobility. Denies headaches, seizures, numbness, or tingling.  Denies skin break down or rash.   PE  BP 108/78   Pulse 61   Resp 15   Ht 6' (1.829 m)   Wt 210 lb (95.3 kg)   SpO2 99%   BMI 28.48 kg/m   Patient alert and oriented and in no cardiopulmonary distress.  HEENT: No facial asymmetry, EOMI,   oropharynx pink and moist.  Neck supple no JVD, no mass.  Chest: Clear to auscultation bilaterally.  CVS: S1, S2 no murmurs, no S3.Regular rate.  ABD: Soft non tender.   Ext: No edema  MS: Adequate ROM spine, shoulders, hips and knees.  Skin: Intact, no ulcerations or rash noted.  Psych:Fair eye  contact,flat affect. Memory intact anxious and  depressed appearing.  CNS: CN 2-12 intact, power,  normal throughout.no focal deficits noted.   Assessment & Plan  Anxiety Improved with tele psych, however will start medication for depression and anxiety, will benefit  Depression, major, single episode, severe (HCC) Start fluoxetine 20 mg daily and face to face  Therapy, f/u in 6 weeks Srart/ commit to daily exercise If no significant improvement , needs treatment by Psychiatry, which I have already tried to convince him of on the day of the visit , but he is wanting to hold off now and see how he does  Insomnia Sleep hygiene reviewed and written information offered also. Prescription sent for  medication needed.at increased dose of 15 mg , advised execise commitment to improve sleep   Perennial allergic rhinitis Increased symptoms with season change , medication prescribed  Overweight (BMI 25.0-29.9) Deteriorated. Patient re-educated about  the importance of commitment to a  minimum of 150 minutes of exercise per week.  The importance of healthy food choices with portion control discussed. Encouraged to start a food diary, count calories and to consider  joining a support group. Sample diet sheets offered. Goals set by the patient for the next several months.   Weight /BMI 01/28/2018 01/14/2018 09/08/2017  WEIGHT 210 lb 213 lb 205 lb  HEIGHT 6\' 0"  6\' 0"  6\' 0"   BMI 28.48 kg/m2 28.89 kg/m2 27.8 kg/m2  Vitamin D deficiency Needs treatment , once weekly vit D prescribed

## 2018-01-29 NOTE — Assessment & Plan Note (Signed)
Increased symptoms with season change , medication prescribed

## 2018-01-29 NOTE — Assessment & Plan Note (Signed)
Deteriorated. Patient re-educated about  the importance of commitment to a  minimum of 150 minutes of exercise per week.  The importance of healthy food choices with portion control discussed. Encouraged to start a food diary, count calories and to consider  joining a support group. Sample diet sheets offered. Goals set by the patient for the next several months.   Weight /BMI 01/28/2018 01/14/2018 09/08/2017  WEIGHT 210 lb 213 lb 205 lb  HEIGHT 6\' 0"  6\' 0"  6\' 0"   BMI 28.48 kg/m2 28.89 kg/m2 27.8 kg/m2

## 2018-01-29 NOTE — Assessment & Plan Note (Signed)
Improved with tele psych, however will start medication for depression and anxiety, will benefit

## 2018-02-10 ENCOUNTER — Ambulatory Visit (INDEPENDENT_AMBULATORY_CARE_PROVIDER_SITE_OTHER): Payer: Self-pay | Admitting: Physician Assistant

## 2018-02-10 VITALS — BP 124/82 | HR 108 | Temp 100.0°F | Resp 20 | Wt 209.0 lb

## 2018-02-10 DIAGNOSIS — J019 Acute sinusitis, unspecified: Secondary | ICD-10-CM

## 2018-02-10 DIAGNOSIS — B9689 Other specified bacterial agents as the cause of diseases classified elsewhere: Secondary | ICD-10-CM

## 2018-02-10 MED ORDER — AMOXICILLIN-POT CLAVULANATE 875-125 MG PO TABS: 1.0000 | ORAL_TABLET | Freq: Two times a day (BID) | ORAL | 0 refills | Status: AC

## 2018-02-10 MED ORDER — NAPROXEN 500 MG PO TABS: 500.0000 mg | ORAL_TABLET | Freq: Two times a day (BID) | ORAL | 0 refills | Status: DC

## 2018-02-10 NOTE — Progress Notes (Signed)
02/10/2018 4:58 PM   DOB: [birth year] / MRN: [id number]  SUBJECTIVE:  Gabriel [name] is a 43 y.o. male presenting for cough, nasal congestion and sinus pressure and HA.  Symptoms have been present for 7 days and worsening today.  + History of ABRS. He is also taking flonase and zyrtec.  He has No Known Allergies.   He  has a past medical history of Heart murmur and Seasonal allergies.    He  reports that he has never smoked. He has never used smokeless tobacco. He reports that he does not drink alcohol or use drugs. He  has no sexual activity history on file. The patient  has a past surgical history that includes No past surgeries.  His family history includes Cancer in his maternal grandfather, maternal grandmother, paternal grandfather, and paternal grandmother; Diabetes in his father; Hypertension in his father, maternal grandfather, mother, paternal grandfather, and paternal grandmother.  Review of Systems  Constitutional: Positive for fever and malaise/fatigue. Negative for chills, diaphoresis and weight loss.  Respiratory: Negative for cough and shortness of breath.   Cardiovascular: Negative for chest pain and leg swelling.  Musculoskeletal: Negative for myalgias.  Skin: Negative for itching and rash.  Neurological: Negative for dizziness.    The problem list and medications were reviewed and updated by myself where necessary and exist elsewhere in the encounter.   OBJECTIVE:  BP 124/82 (BP Location: Right Arm, Patient Position: Sitting, Cuff Size: Normal)   Pulse (!) 108   Temp 100 F (37.8 C) (Oral)   Resp 20   Wt 209 lb (94.8 kg)   SpO2 96%   BMI 28.35 kg/m   Wt Readings from Last 3 Encounters:  02/10/18 209 lb (94.8 kg)  01/28/18 210 lb (95.3 kg)  01/14/18 213 lb (96.6 kg)   Temp Readings from Last 3 Encounters:  02/10/18 100 F (37.8 C) (Oral)  02/19/17 (!) 97.1 F (36.2 C) (Other (Comment))  02/21/16 98 F (36.7 C) (Oral)   BP Readings from Last 3  Encounters:  02/10/18 124/82  01/28/18 108/78  01/14/18 100/78   Pulse Readings from Last 3 Encounters:  02/10/18 (!) 108  01/28/18 61  01/14/18 78    Physical Exam  Constitutional: He is oriented to person, place, and time. He appears well-developed and well-nourished.  Non-toxic appearance. He does not have a sickly appearance. He appears ill. No distress.  HENT:  Nose: No rhinorrhea. Right sinus exhibits frontal sinus tenderness. Right sinus exhibits no maxillary sinus tenderness. Left sinus exhibits frontal sinus tenderness. Left sinus exhibits no maxillary sinus tenderness.  Eyes: Pupils are equal, round, and reactive to light. Conjunctivae and EOM are normal.  Cardiovascular: Normal rate, regular rhythm, normal heart sounds and intact distal pulses.  Pulmonary/Chest: Effort normal. No stridor. No respiratory distress. He has no wheezes. He has no rales. He exhibits no tenderness.  Abdominal: He exhibits no distension and no mass. There is no tenderness. There is no rebound and no guarding. No hernia.  Musculoskeletal: Normal range of motion.  Neurological: He is alert and oriented to person, place, and time. No cranial nerve deficit. Coordination normal.  Skin: Skin is warm and dry. He is not diaphoretic.  Psychiatric: He has a normal mood and affect.  Nursing note and vitals reviewed.   Lab Results  Component Value Date   HGBA1C 5.9 (H) 12/04/2016    Lab Results  Component Value Date   WBC 4.8 12/04/2016   HGB 14.7 12/04/2016  HCT 44.4 12/04/2016   MCV 88.1 12/04/2016   PLT 361 12/04/2016    Lab Results  Component Value Date   CREATININE 1.00 12/04/2016   BUN 15 12/04/2016   NA 141 12/04/2016   K 4.1 12/04/2016   CL 103 12/04/2016   CO2 25 12/04/2016    Lab Results  Component Value Date   ALT 61 (H) 06/14/2007   AST 51 (H) 06/14/2007   ALKPHOS 82 06/14/2007   BILITOT 0.9 06/14/2007    Lab Results  Component Value Date   TSH 2.11 07/16/2017    Lab  Results  Component Value Date   CHOL 164 12/04/2016   HDL 56 12/04/2016   LDLCALC 98 12/04/2016   TRIG 49 12/04/2016   CHOLHDL 2.9 12/04/2016     ASSESSMENT AND PLAN:  [name] was seen today for congestion, headache, pressure behind the eyes.  Diagnoses and all orders for this visit:  Acute bacterial rhinosinusitis -     amoxicillin-clavulanate (AUGMENTIN) 875-125 MG tablet; Take 1 tablet by mouth 2 (two) times daily for 10 days. Take with food. -     naproxen (NAPROSYN) 500 MG tablet; Take 1 tablet (500 mg total) by mouth 2 (two) times daily with a meal.    The patient is advised to call or return to clinic if he does not see an improvement in symptoms, or to seek the care of the closest emergency department if he worsens with the above plan.   Deliah Boston, MHS, PA-C [facility] 02/10/2018 4:58 PM

## 2018-02-18 DIAGNOSIS — Z13220 Encounter for screening for lipoid disorders: Secondary | ICD-10-CM | POA: Diagnosis not present

## 2018-02-18 DIAGNOSIS — R7301 Impaired fasting glucose: Secondary | ICD-10-CM | POA: Diagnosis not present

## 2018-02-18 DIAGNOSIS — Z125 Encounter for screening for malignant neoplasm of prostate: Secondary | ICD-10-CM | POA: Diagnosis not present

## 2018-02-18 LAB — CBC
HCT: 42.8 % (ref 38.5–50.0)
HEMOGLOBIN: 14.3 g/dL (ref 13.2–17.1)
MCH: 28.5 pg (ref 27.0–33.0)
MCHC: 33.4 g/dL (ref 32.0–36.0)
MCV: 85.4 fL (ref 80.0–100.0)
MPV: 9 fL (ref 7.5–12.5)
Platelets: 389 10*3/uL (ref 140–400)
RBC: 5.01 10*6/uL (ref 4.20–5.80)
RDW: 12.1 % (ref 11.0–15.0)
WBC: 4.4 10*3/uL (ref 3.8–10.8)

## 2018-02-18 LAB — LIPID PANEL
CHOLESTEROL: 156 mg/dL (ref <200)
HDL: 33 mg/dL — ABNORMAL LOW (ref >40)
LDL CHOLESTEROL (CALC): 108 mg/dL — AB
Non-HDL Cholesterol (Calc): 123 mg/dL (calc) (ref <130)
Total CHOL/HDL Ratio: 4.7 (calc) (ref <5.0)
Triglycerides: 61 mg/dL (ref <150)

## 2018-02-18 LAB — COMPLETE METABOLIC PANEL WITH GFR
AG Ratio: 1.8 (calc) (ref 1.0–2.5)
ALBUMIN MSPROF: 4.3 g/dL (ref 3.6–5.1)
ALKALINE PHOSPHATASE (APISO): 85 U/L (ref 40–115)
ALT: 54 U/L — ABNORMAL HIGH (ref 9–46)
AST: 36 U/L (ref 10–40)
BUN: 16 mg/dL (ref 7–25)
CALCIUM: 9.2 mg/dL (ref 8.6–10.3)
CO2: 30 mmol/L (ref 20–32)
CREATININE: 1.14 mg/dL (ref 0.60–1.35)
Chloride: 103 mmol/L (ref 98–110)
GFR, EST AFRICAN AMERICAN: 91 mL/min/{1.73_m2} (ref >=60)
GFR, EST NON AFRICAN AMERICAN: 79 mL/min/{1.73_m2} (ref >=60)
GLOBULIN: 2.4 g/dL (ref 1.9–3.7)
Glucose, Bld: 99 mg/dL (ref 65–99)
Potassium: 4.1 mmol/L (ref 3.5–5.3)
SODIUM: 140 mmol/L (ref 135–146)
TOTAL PROTEIN: 6.7 g/dL (ref 6.1–8.1)
Total Bilirubin: 0.5 mg/dL (ref 0.2–1.2)

## 2018-02-18 LAB — PSA: PSA: 0.5 ng/mL (ref <=4.0)

## 2018-02-19 ENCOUNTER — Encounter: Payer: Self-pay | Admitting: Family Medicine

## 2018-02-19 ENCOUNTER — Encounter

## 2018-02-19 ENCOUNTER — Ambulatory Visit (INDEPENDENT_AMBULATORY_CARE_PROVIDER_SITE_OTHER): Payer: 59 | Admitting: Licensed Clinical Social Worker

## 2018-02-19 DIAGNOSIS — F321 Major depressive disorder, single episode, moderate: Secondary | ICD-10-CM | POA: Diagnosis not present

## 2018-02-20 ENCOUNTER — Encounter (HOSPITAL_COMMUNITY): Payer: Self-pay | Admitting: Licensed Clinical Social Worker

## 2018-02-20 NOTE — Progress Notes (Signed)
   THERAPIST PROGRESS NOTE  Session Time: 10-11  Participation Level: Active  Behavioral Response: Casual and Well GroomedAlertEuthymic  Type of Therapy: Individual Therapy  Treatment Goals addressed: Coping  Interventions: CBT and Supportive  Summary: Gabriel [name] is a 42 y.o. male who presents with MDD prolonged sxs resulting from a job loss in April 2019.  "I think I just need a place to vent and feel validated."  Pt is active, engaged, makes strong eye contact, is eloquent, and loquacious in session. He describes his 10 year work hx for [employer] and his recent surprising job loss after a disagreement w/ Presenter, broadcasting. Pt states he feels he was cheated since he did not get an opportunity to apply for a higher position which was filled outside the normal protocol. He states he feels race is a factor in his firing since he had outstanding rapport w/ clients, and had only received positive reviews since starting. Pt had recently gotten a raise in February 2019 but when he complained to HR about the missed opportunity he was quickly and abruptly fired. Counselor spent time validating pt's story to which pt responded positively. When asked about his mental health sxs. Pt reported he has lack of interest, unexplained tearfulness, and isolating bxs regularly throughout the week.   Suicidal/Homicidal: Nowithout intent/plan  Therapist Response: Counselor used open questions, information gathering, and validation. Pt appears to be suffering from MDD resulting from a prolonged adjustment disorder. Pt is pursuing legal action against Gabriel [name]. and this may be interfering w/ his ability to secure a new job, since he may be hesitant to accept another position. Pt states he has interviewed and had offers for other jobs but they are "at the bottom" and he does not want to go back to this level of work.   Plan: Return again in 4 weeks.  Diagnosis:    ICD-10-CM   1. Current moderate episode of  major depressive disorder without prior episode [facility]) F32.1        Margo Common, LCAS-A 02/20/2018

## 2018-03-04 DIAGNOSIS — K001 Supernumerary teeth: Secondary | ICD-10-CM | POA: Diagnosis not present

## 2018-03-04 DIAGNOSIS — R930 Abnormal findings on diagnostic imaging of skull and head, not elsewhere classified: Secondary | ICD-10-CM | POA: Diagnosis not present

## 2018-03-04 DIAGNOSIS — K011 Impacted teeth: Secondary | ICD-10-CM | POA: Diagnosis not present

## 2018-03-11 ENCOUNTER — Ambulatory Visit: Payer: Self-pay | Admitting: Family Medicine

## 2018-03-17 ENCOUNTER — Encounter: Payer: Self-pay | Admitting: Family Medicine

## 2018-03-17 ENCOUNTER — Ambulatory Visit (INDEPENDENT_AMBULATORY_CARE_PROVIDER_SITE_OTHER): Payer: 59 | Admitting: Family Medicine

## 2018-03-17 VITALS — BP 120/80 | HR 76 | Resp 12 | Ht 72.0 in | Wt 206.1 lb

## 2018-03-17 DIAGNOSIS — F5104 Psychophysiologic insomnia: Secondary | ICD-10-CM

## 2018-03-17 DIAGNOSIS — F419 Anxiety disorder, unspecified: Secondary | ICD-10-CM

## 2018-03-17 DIAGNOSIS — F329 Major depressive disorder, single episode, unspecified: Secondary | ICD-10-CM | POA: Diagnosis not present

## 2018-03-17 DIAGNOSIS — F322 Major depressive disorder, single episode, severe without psychotic features: Secondary | ICD-10-CM

## 2018-03-17 DIAGNOSIS — F32A Depression, unspecified: Secondary | ICD-10-CM

## 2018-03-17 MED ORDER — TEMAZEPAM 30 MG PO CAPS: 30.0000 mg | ORAL_CAPSULE | Freq: Every evening | ORAL | 1 refills | Status: DC | PRN

## 2018-03-17 MED ORDER — ALPRAZOLAM 0.5 MG PO TABS: ORAL_TABLET | ORAL | 1 refills | Status: AC

## 2018-03-17 MED FILL — TEMAZEPAM 30 MG CAPSULE: 30 | 30 days supply | Qty: 30 | Fill #0

## 2018-03-17 MED FILL — ALPRAZolam 0.5 MG TABS: 0.5 | 30 days supply | Qty: 30 | Fill #0

## 2018-03-17 NOTE — Progress Notes (Signed)
   Gabriel [name]     MRN: [id number]      DOB: Aug 18, 1975   HPI Gabriel [name] is here for follow up and re-evaluation of depression and insomnia. No improvement in symptoms in the past 6 months. Not suicidal or homicidal, however unable to function due to severity of symptoms. "Unable to rest" and has a PHQ 9 score of 22. Recently;y started therapy and is now ready to be treated by Psychiatry.   ROS Denies recent fever or chills.Severe chronic fatigue Denies sinus pressure, nasal congestion, ear pain or sore throat. Denies chest congestion, productive cough or wheezing. Denies chest pains, palpitations and leg swelling Denies abdominal pain, nausea, vomiting,diarrhea or constipation.   Denies dysuria, frequency, hesitancy or incontinence. Denies joint pain, swelling and limitation in mobility.  Denies skin break down or rash.   PE  Pulse 76   Resp 12   Ht 6' (1.829 m)   Wt 206 lb 1.3 oz (93.5 kg)   SpO2 96% Comment: room air  BMI 27.95 kg/m   Patient alert and oriented and in no cardiopulmonary distress.Flat affect ,and anxious  HEENT: No facial asymmetry, EOMI,   oropharynx pink and moist.  Neck supple no JVD, no mass.  Chest: Clear to auscultation bilaterally.  CVS: S1, S2 no murmurs, no S3.Regular rate.  ABD: Soft non tender.   Ext: No edema  MS: Adequate ROM spine, shoulders, hips and knees.  Skin: Intact, no ulcerations or rash noted.  Psych: Good eye contact, flat affect, bothanxious or depressed appearing.  CNS: CN 2-12 intact, power,  normal throughout.no focal deficits noted.   Assessment & Plan  Acute depression Refer to psychiatry asap, needsbetter med management for treatment and cure. No change in fluoxetine dose , will defer to psych   Insomnia Very poor sleep and chronic fatigue, increase restoril to max dose of 30 mg daily and add low dose xanax, will defer to Psych for ongoing management  Anxiety svere , interferews with sleep, start low  dose xanax short term, refer to psych

## 2018-03-17 NOTE — Patient Instructions (Signed)
F/U in  4 month months, call if you need me before  Increase dose of restoril and new is bedtime xanax. Limited duration until psychiatry starts taking care of you only  Continue same dose of fluoxetine.  Do not have ANY doubt of the love and support of your family and they are the people who mean the nmost in your life. As  A part of your health care team , I ASSURE YOU that you WILL recover and that all will be well. We are here for you and cheering you on

## 2018-03-17 NOTE — Assessment & Plan Note (Signed)
Very poor sleep and chronic fatigue, increase restoril to max dose of 30 mg daily and add low dose xanax, will defer to Psych for ongoing management

## 2018-03-17 NOTE — Assessment & Plan Note (Signed)
svere , interferews with sleep, start low dose xanax short term, refer to psych

## 2018-03-17 NOTE — Assessment & Plan Note (Signed)
Refer to psychiatry asap, needsbetter med management for treatment and cure. No change in fluoxetine dose , will defer to psych
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
## 2018-03-25 ENCOUNTER — Ambulatory Visit (INDEPENDENT_AMBULATORY_CARE_PROVIDER_SITE_OTHER): Payer: 59 | Admitting: Psychiatry

## 2018-03-25 ENCOUNTER — Encounter (HOSPITAL_COMMUNITY): Payer: Self-pay | Admitting: Psychiatry

## 2018-03-25 VITALS — BP 126/78 | HR 70 | Ht 72.0 in | Wt 203.0 lb

## 2018-03-25 DIAGNOSIS — F321 Major depressive disorder, single episode, moderate: Secondary | ICD-10-CM

## 2018-03-25 NOTE — Patient Instructions (Signed)
1. Increase fluoxetine 40 mg daily  2. Return to clinic in one month for 30 mins

## 2018-04-02 ENCOUNTER — Ambulatory Visit (HOSPITAL_COMMUNITY): Payer: 59 | Admitting: Licensed Clinical Social Worker

## 2018-04-27 NOTE — Progress Notes (Deleted)
BH MD/PA/NP OP Progress Note  04/27/2018 12:05 PM Gabriel [name]  MRN:  [id number]  Chief Complaint:  HPI: *** Visit Diagnosis: No diagnosis found.  Past Psychiatric History: Please see initial evaluation for full details. I have reviewed the history. No updates at this time.     Past Medical History:  Past Medical History:  Diagnosis Date  . Heart murmur   . Seasonal allergies     Past Surgical History:  Procedure Laterality Date  . NO PAST SURGERIES      Family Psychiatric History: Please see initial evaluation for full details. I have reviewed the history. No updates at this time.     Family History:  Family History  Problem Relation Age of Onset  . Diabetes Father   . Hypertension Father   . Hypertension Mother   . Cancer Maternal Grandmother   . Cancer Maternal Grandfather   . Hypertension Maternal Grandfather   . Cancer Paternal Grandmother   . Hypertension Paternal Grandmother   . Cancer Paternal Grandfather   . Hypertension Paternal Grandfather     Social History:  Social History   Socioeconomic History  . Marital status: Married    Spouse name: Not on file  . Number of children: 2  . Years of education: 91  . Highest education level: High school graduate  Occupational History  . Not on file  Social Needs  . Financial resource strain: Somewhat hard  . Food insecurity:    Worry: Never true    Inability: Never true  . Transportation needs:    Medical: No    Non-medical: No  Tobacco Use  . Smoking status: Never Smoker  . Smokeless tobacco: Never Used  Substance and Sexual Activity  . Alcohol use: No    Alcohol/week: 0.0 standard drinks  . Drug use: No  . Sexual activity: Not on file  Lifestyle  . Physical activity:    Days per week: Not on file    Minutes per session: Not on file  . Stress: Very much  Relationships  . Social connections:    Talks on phone: Not on file    Gets together: Not on file    Attends religious service: Not  on file    Active member of club or organization: Not on file    Attends meetings of clubs or organizations: Not on file    Relationship status: Not on file  Other Topics Concern  . Not on file  Social History Narrative   He lives with wife and two children.   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    Allergies: No Known Allergies  Metabolic Disorder Labs: Lab Results  Component Value Date   HGBA1C 5.9 (H) 12/04/2016   MPG 123 12/04/2016   MPG 131 09/13/2015   No results found for: PROLACTIN Lab Results  Component Value Date   CHOL 156 02/18/2018   TRIG 61 02/18/2018   HDL 33 (L) 02/18/2018   CHOLHDL 4.7 02/18/2018   VLDL 10 12/04/2016   LDLCALC 108 (H) 02/18/2018   LDLCALC 98 12/04/2016   Lab Results  Component Value Date   TSH 2.11 07/16/2017   TSH 1.67 12/04/2016    Therapeutic Level Labs: No results found for: LITHIUM No results found for: VALPROATE No components found for:  CBMZ  Current Medications: Current Outpatient Medications  Medication Sig Dispense Refill  . ALPRAZolam (XANAX) 0.5 MG tablet One tablet at bedtime  for sleep 30 tablet 1  . ergocalciferol (VITAMIN D2) 50000 units capsule Take 1 capsule (50,000 Units total) by mouth once a week. One capsule once weekly 12 capsule 1  . FLUoxetine (PROZAC) 20 MG tablet Take 1 tablet (20 mg total) by mouth daily. 90 tablet 1  . loratadine (CLARITIN) 10 MG tablet Take 1 tablet (10 mg total) by mouth daily. 90 tablet 1  . mometasone (NASONEX) 50 MCG/ACT nasal spray Place 2 sprays into the nose daily. 34 g 2  . naproxen (NAPROSYN) 500 MG tablet Take 1 tablet (500 mg total) by mouth 2 (two) times daily with a meal. 30 tablet 0  . temazepam (RESTORIL) 30 MG capsule Take 1 capsule (30 mg total) by mouth at bedtime as needed for sleep. 30 capsule 1   No current facility-administered medications for this visit.      Musculoskeletal: Strength & Muscle Tone: within  normal limits Gait & Station: normal Patient leans: N/A  Psychiatric Specialty Exam: ROS  There were no vitals taken for this visit.There is no height or weight on file to calculate BMI.  General Appearance: Fairly Groomed  Eye Contact:  Good  Speech:  Clear and Coherent  Volume:  Normal  Mood:  {BHH MOOD:22306}  Affect:  {Affect (PAA):22687}  Thought Process:  Coherent  Orientation:  Full (Time, Place, and Person)  Thought Content: Logical   Suicidal Thoughts:  {ST/HT (PAA):22692}  Homicidal Thoughts:  {ST/HT (PAA):22692}  Memory:  Immediate;   Good  Judgement:  {Judgement (PAA):22694}  Insight:  {Insight (PAA):22695}  Psychomotor Activity:  Normal  Concentration:  Concentration: Good and Attention Span: Good  Recall:  Good  Fund of Knowledge: Good  Language: Good  Akathisia:  No  Handed:  Right  AIMS (if indicated): not done  Assets:  Communication Skills Desire for Improvement  ADL's:  Intact  Cognition: WNL  Sleep:  {B[facility] GOOD/FAIR/POOR:22877}   Screenings: GAD-7     [facility]2017 [facility] 09/08/2017 in [facility]  Total GAD-7 Score  15  10  15  11  14     PHQ2-9     [facility] Visit [facility] 03/17/2018 [facility]2018 [facility]/04[facility]2017 [facility]2017 [facility]  PHQ-2 Total Score  6  6  4  5  5   PHQ-9 Total Score  22  21  11  16  13        Assessment and Plan:  [name] [name] is a 42 y.o. year old male with a history of depression, bilateral carpel tunnel syndrome  , who presents  for follow up appointment for No diagnosis found.  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  Will uptitrate fluoxetine to target depression.  Validated his feeling.  Explored his value of love and respect.  Explored the way he can take action in line with his values.  He will greatly benefit from therapy; he  is encouraged to continue to see Mr. Ernestine Conrad for therapy.    Plan 1. Increase fluoxetine 40 mg daily  2. Return to clinic in one month for 30 mins - he is on temazepam 30 mg, xanax 0.5 mg, prescribed by PCP.  The patient demonstrates the following risk factors for suicide: Chronic risk factors for suicide include: psychiatric disorder of depression. Acute risk factors for suicide include: unemployment. Protective factors for this patient include: positive social support, responsibility to others (children, family), coping skills and hope for the future. Considering these factors, the overall suicide risk at this point appears to be low. Patient is appropriate for outpatient follow up.  [name], MD 04/27/2018, 12:05 PM

## 2018-04-28 ENCOUNTER — Ambulatory Visit (INDEPENDENT_AMBULATORY_CARE_PROVIDER_SITE_OTHER): Payer: 59 | Admitting: Licensed Clinical Social Worker

## 2018-04-28 DIAGNOSIS — F321 Major depressive disorder, single episode, moderate: Secondary | ICD-10-CM
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
## 2018-04-30 ENCOUNTER — Ambulatory Visit (HOSPITAL_COMMUNITY): Payer: 59 | Admitting: Psychiatry

## 2018-04-30 DIAGNOSIS — L2084 Intrinsic (allergic) eczema: Secondary | ICD-10-CM | POA: Diagnosis not present

## 2018-04-30 DIAGNOSIS — L700 Acne vulgaris: Secondary | ICD-10-CM | POA: Diagnosis not present

## 2018-04-30 DIAGNOSIS — L219 Seborrheic dermatitis, unspecified: Secondary | ICD-10-CM | POA: Diagnosis not present

## 2018-04-30 MED FILL — MIRTAZAPINE 7.5 MG TABLET: 7.5 | 30 days supply | Qty: 30 | Fill #1

## 2018-04-30 MED FILL — FLUoxetine HCL 20 MG TABS: 20 | 90 days supply | Qty: 90 | Fill #1

## 2018-04-30 MED FILL — KETOCONAZOLE 2 % SHAM: 2 | 30 days supply | Qty: 120 | Fill #0

## 2018-05-19 MED FILL — ALPRAZolam 0.5 MG TABS: 0.5 | 30 days supply | Qty: 30 | Fill #1

## 2018-05-19 MED FILL — HYDROCORTISONE 2.5% CREAM: 2.5 | 30 days supply | Qty: 30 | Fill #1

## 2018-06-08 ENCOUNTER — Ambulatory Visit (HOSPITAL_COMMUNITY): Payer: 59 | Admitting: Licensed Clinical Social Worker

## 2018-06-09 NOTE — Progress Notes (Deleted)
BH MD/PA/NP OP Progress Note  06/09/2018 3:24 PM Gabriel [name]  MRN:  [id number]  Chief Complaint:  HPI: *** Visit Diagnosis: No diagnosis found.  Past Psychiatric History: Please see initial evaluation for full details. I have reviewed the history. No updates at this time.     Past Medical History:  Past Medical History:  Diagnosis Date  . Heart murmur   . Seasonal allergies     Past Surgical History:  Procedure Laterality Date  . NO PAST SURGERIES      Family Psychiatric History: Please see initial evaluation for full details. I have reviewed the history. No updates at this time.     Family History:  Family History  Problem Relation Age of Onset  . Diabetes Father   . Hypertension Father   . Hypertension Mother   . Cancer Maternal Grandmother   . Cancer Maternal Grandfather   . Hypertension Maternal Grandfather   . Cancer Paternal Grandmother   . Hypertension Paternal Grandmother   . Cancer Paternal Grandfather   . Hypertension Paternal Grandfather     Social History:  Social History   Socioeconomic History  . Marital status: Married    Spouse name: Not on file  . Number of children: 2  . Years of education: 11  . Highest education level: High school graduate  Occupational History  . Not on file  Social Needs  . Financial resource strain: Somewhat hard  . Food insecurity:    Worry: Never true    Inability: Never true  . Transportation needs:    Medical: No    Non-medical: No  Tobacco Use  . Smoking status: Never Smoker  . Smokeless tobacco: Never Used  Substance and Sexual Activity  . Alcohol use: No    Alcohol/week: 0.0 standard drinks  . Drug use: No  . Sexual activity: Not on file  Lifestyle  . Physical activity:    Days per week: Not on file    Minutes per session: Not on file  . Stress: Very much  Relationships  . Social connections:    Talks on phone: Not on file    Gets together: Not on file    Attends religious service: Not  on file    Active member of club or organization: Not on file    Attends meetings of clubs or organizations: Not on file    Relationship status: Not on file  Other Topics Concern  . Not on file  Social History Narrative   He lives with wife and two children.   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    Allergies: No Known Allergies  Metabolic Disorder Labs: Lab Results  Component Value Date   HGBA1C 5.9 (H) 12/04/2016   MPG 123 12/04/2016   MPG 131 09/13/2015   No results found for: PROLACTIN Lab Results  Component Value Date   CHOL 156 02/18/2018   TRIG 61 02/18/2018   HDL 33 (L) 02/18/2018   CHOLHDL 4.7 02/18/2018   VLDL 10 12/04/2016   LDLCALC 108 (H) 02/18/2018   LDLCALC 98 12/04/2016   Lab Results  Component Value Date   TSH 2.11 07/16/2017   TSH 1.67 12/04/2016    Therapeutic Level Labs: No results found for: LITHIUM No results found for: VALPROATE No components found for:  CBMZ  Current Medications: Current Outpatient Medications  Medication Sig Dispense Refill  . ergocalciferol (VITAMIN D2) 50000 units capsule Take 1 capsule (  50,000 Units total) by mouth once a week. One capsule once weekly 12 capsule 1  . FLUoxetine (PROZAC) 20 MG tablet Take 1 tablet (20 mg total) by mouth daily. 90 tablet 1  . loratadine (CLARITIN) 10 MG tablet Take 1 tablet (10 mg total) by mouth daily. 90 tablet 1  . mometasone (NASONEX) 50 MCG/ACT nasal spray Place 2 sprays into the nose daily. 34 g 2  . naproxen (NAPROSYN) 500 MG tablet Take 1 tablet (500 mg total) by mouth 2 (two) times daily with a meal. 30 tablet 0  . temazepam (RESTORIL) 30 MG capsule Take 1 capsule (30 mg total) by mouth at bedtime as needed for sleep. 30 capsule 1   No current facility-administered medications for this visit.      Musculoskeletal: Strength & Muscle Tone: within normal limits Gait & Station: normal Patient leans: N/A  Psychiatric  Specialty Exam: ROS  There were no vitals taken for this visit.There is no height or weight on file to calculate BMI.  General Appearance: Fairly Groomed  Eye Contact:  Good  Speech:  Clear and Coherent  Volume:  Normal  Mood:  {BHH MOOD:22306}  Affect:  {Affect (PAA):22687}  Thought Process:  Coherent  Orientation:  Full (Time, Place, and Person)  Thought Content: Logical   Suicidal Thoughts:  {ST/HT (PAA):22692}  Homicidal Thoughts:  {ST/HT (PAA):22692}  Memory:  Immediate;   Good  Judgement:  {Judgement (PAA):22694}  Insight:  {Insight (PAA):22695}  Psychomotor Activity:  Normal  Concentration:  Concentration: Good and Attention Span: Good  Recall:  Good  Fund of Knowledge: Good  Language: Good  Akathisia:  No  Handed:  Right  AIMS (if indicated): not done  Assets:  Communication Skills Desire for Improvement  ADL's:  Intact  Cognition: WNL  Sleep:  {BHH GOOD/FAIR/POOR:22877}   Screenings: GAD-7     [facility] Follow [facility]04[facility]2017 [facility] from 10[facility]24[facility]2017 [facility] Visit [facility] 09/08/2017 in [facility]  Total GAD-7 Score  15  10  15  11  14     PHQ2-9     [facility] Visit [facility] 03/17/2018 in [facility]2018 [facility] from 01/08/2018 [facility] Follow [facility] from 12[facility]04/2017 [facility] Follow Up from 10/24/2017 in [facility]  PHQ-2 Total Score  6  6  4  5  5   PHQ-9 Total Score  22  21  11  16  13        Assessment and Plan:  Gabriel [name] is a 43 y.o. year old male with a history of depression, bilateral carpel tunnel syndrome, who presents for follow up appointment for No diagnosis found.  # MDD, moderate,  single episode without psychotic features  Patient reports depressive symptoms and anxiety in the context of unemployment, and recent termination of his job, which he believes is due to his race.  Will uptitrate fluoxetine to target depression.  Validated his feeling.  Explored his value of love and respect.  Explored the way he can take action in line with his values.  He will greatly benefit from therapy; he is encouraged to continue to see Mr. Ernestine ConradSwan for therapy.    Plan 1. Increase fluoxetine 40  mg daily  2. Return to clinic in one month for 30 mins - he is on temazepam 30 mg, xanax 0.5 mg, prescribed by PCP.  The patient demonstrates the following risk factors for suicide: Chronic risk factors for suicide include: psychiatric disorder of depression. Acute risk factors for suicide include: unemployment. Protective factors for this patient include: positive social support, responsibility to others (children, family), coping skills and hope for the future. Considering these factors, the overall suicide risk at this point appears to be low. Patient is appropriate for outpatient follow up.  [name], MD 06/09/2018, 3:24 PM

## 2018-06-15 ENCOUNTER — Ambulatory Visit (HOSPITAL_COMMUNITY): Payer: 59 | Admitting: Psychiatry

## 2018-06-22 NOTE — Progress Notes (Signed)
BH MD/PA/NP OP Progress Note  06/25/2018 10:16 AM Gabriel [name]  MRN:  [id number]  Chief Complaint:  Chief Complaint    Depression; Follow-up     HPI:  Patient presents for follow-up appointment for depression.  He states that there has been no change since up titration of Prozac.  He brings his children to school.  Although he has good connection with his children and his wife, he has been more isolated and just want to be by himself.  It is "difficult" for him to plan celebrating tenth year anniversary, although he knows he has to do something for his wife.  He thinks about unemployment, although he imagined he would have great life around this time. It is loss and "stab in the heart." He agrees that he would find out things to do so that he can continue to have good connection with his family while he grieves for loss.  He has occasional insomnia.  He feels fatigue and has anhedonia.  He has fair concentration.  He denies SI.  He feels anxious and tense, feeling restless.  He denies panic attacks. He has appetite loss. He takes Xanax every day for anxiety.  Wt Readings from Last 3 Encounters:  06/25/18 209 lb (94.8 kg)  03/25/18 203 lb (92.1 kg)  03/17/18 206 lb 1.3 oz (93.5 kg)    Visit Diagnosis:    ICD-10-CM   1. Current moderate episode of major depressive disorder without prior episode (HCC) F32.1     Past Psychiatric History: Please see initial evaluation for full details. I have reviewed the history. No updates at this time.     Past Medical History:  Past Medical History:  Diagnosis Date  . Heart murmur   . Seasonal allergies     Past Surgical History:  Procedure Laterality Date  . NO PAST SURGERIES      Family Psychiatric History: Please see initial evaluation for full details. I have reviewed the history. No updates at this time.     Family History:  Family History  Problem Relation Age of Onset  . Diabetes Father   . Hypertension Father   .  Hypertension Mother   . Cancer Maternal Grandmother   . Cancer Maternal Grandfather   . Hypertension Maternal Grandfather   . Cancer Paternal Grandmother   . Hypertension Paternal Grandmother   . Cancer Paternal Grandfather   . Hypertension Paternal Grandfather     Social History:  Social History   Socioeconomic History  . Marital status: Married    Spouse name: Not on file  . Number of children: 2  . Years of education: 3612  . Highest education level: High school graduate  Occupational History  . Not on file  Social Needs  . Financial resource strain: Somewhat hard  . Food insecurity:    Worry: Never true    Inability: Never true  . Transportation needs:    Medical: No    Non-medical: No  Tobacco Use  . Smoking status: Never Smoker  . Smokeless tobacco: Never Used  Substance and Sexual Activity  . Alcohol use: No    Alcohol/week: 0.0 standard drinks  . Drug use: No  . Sexual activity: Not on file  Lifestyle  . Physical activity:    Days per week: Not on file    Minutes per session: Not on file  . Stress: Very much  Relationships  . Social connections:    Talks on phone: Not on file    Gets  together: Not on file    Attends religious service: Not on file    Active member of club or organization: Not on file    Attends meetings of clubs or organizations: Not on file    Relationship status: Not on file  Other Topics Concern  . Not on file  Social History Narrative   He lives with wife and two children.   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    Allergies: No Known Allergies  Metabolic Disorder Labs: Lab Results  Component Value Date   HGBA1C 5.9 (H) 12/04/2016   MPG 123 12/04/2016   MPG 131 09/13/2015   No results found for: PROLACTIN Lab Results  Component Value Date   CHOL 156 02/18/2018   TRIG 61 02/18/2018   HDL 33 (L) 02/18/2018   CHOLHDL 4.7 02/18/2018   VLDL 10 12/04/2016   LDLCALC 108 (H)  02/18/2018   LDLCALC 98 12/04/2016   Lab Results  Component Value Date   TSH 2.11 07/16/2017   TSH 1.67 12/04/2016    Therapeutic Level Labs: No results found for: LITHIUM No results found for: VALPROATE No components found for:  CBMZ  Current Medications: Current Outpatient Medications  Medication Sig Dispense Refill  . ergocalciferol (VITAMIN D2) 50000 units capsule Take 1 capsule (50,000 Units total) by mouth once a week. One capsule once weekly 12 capsule 1  . loratadine (CLARITIN) 10 MG tablet Take 1 tablet (10 mg total) by mouth daily. 90 tablet 1  . mometasone (NASONEX) 50 MCG/ACT nasal spray Place 2 sprays into the nose daily. 34 g 2  . naproxen (NAPROSYN) 500 MG tablet Take 1 tablet (500 mg total) by mouth 2 (two) times daily with a meal. 30 tablet 0  . temazepam (RESTORIL) 30 MG capsule Take 1 capsule (30 mg total) by mouth at bedtime as needed for sleep. 30 capsule 1  . sertraline (ZOLOFT) 50 MG tablet 25 mg at night for one week, then 50 mg at night 30 tablet 0   No current facility-administered medications for this visit.      Musculoskeletal: Strength & Muscle Tone: within normal limits Gait & Station: normal Patient leans: N/A  Psychiatric Specialty Exam: Review of Systems  Psychiatric/Behavioral: Positive for depression. Negative for hallucinations, memory loss, substance abuse and suicidal ideas. The patient is nervous/anxious and has insomnia.   All other systems reviewed and are negative.   Blood pressure 112/71, pulse 77, height 6' (1.829 m), weight 209 lb (94.8 kg), SpO2 98 %.Body mass index is 28.35 kg/m.  General Appearance: Fairly Groomed  Eye Contact:  Good  Speech:  Clear and Coherent  Volume:  Normal  Mood:  Anxious and Depressed  Affect:  Appropriate, Congruent, Restricted and down  Thought Process:  Coherent  Orientation:  Full (Time, Place, and Person)  Thought Content: Logical   Suicidal Thoughts:  No  Homicidal Thoughts:  No  Memory:   Immediate;   Good  Judgement:  Good  Insight:  Good  Psychomotor Activity:  Normal  Concentration:  Concentration: Good and Attention Span: Good  Recall:  Good  Fund of Knowledge: Good  Language: Good  Akathisia:  No  Handed:  Right  AIMS (if indicated): not done  Assets:  Communication Skills Desire for Improvement  ADL's:  Intact  Cognition: WNL  Sleep:  Fair   Screenings: GAD-7     Office Visit from 01/28/2018 in [facility] Phone [facility] Follow Up from 12[facility]04/2017 in [facility] Follow Up from 10/24/2017 in [facility] Visit from 09/08/2017 in [facility]  Total GAD-7 Score  15  10  15  11  14     PHQ2-9     [facility]17/2018 [facility]/28/2018 [facility] Phone Follow Up from 01/08/2018 [facility] Follow [facility] from 12/04/2017 [facility] Phone Follow Up from 10/24/2017 in [facility]  PHQ-2 Total Score  6  6  4  5  5   PHQ-9 Total Score  22  21  11  16  13        Assessment and Plan:  Gabriel [name] is a 43 y.o. 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  Psychosocial stressors including unemployment, and the recent termination of job.  Will cross taper from fluoxetine to sertraline given he has limited benefit from fluoxetine.  Discussed potential GI side effect and serotonin syndrome.  Validated his feeling of loss.  Explored his value of connection with his family.  He is encouraged to continue  to see Mr. Ernestine ConradSwan for therapy.   Plan 1. Decrease fluoxetine 20 mg daily for one week, then discontinue  2. Start sertraline 25 mg at night for one week, then 50 mg daily  3. Return to clinic in one month for 30 mins - he is on temazepam 30 mg, xanax 0.5 mg, prescribed by PCP.  Past trials of medication: mirtazapine, nortriptyline for carpel tunnel syndrome  The patient demonstrates the following risk factors for suicide: Chronic risk factors for suicide include: psychiatric disorder of depression. Acute risk factors for suicide include: unemployment. Protective factors for this patient include: positive social support, responsibility to others (children, family), coping skills and hope for the future. Considering these factors, the overall suicide risk at this point appears to be low. Patient is appropriate for outpatient follow up.  Neysa Hottereina Elira Colasanti, MD 06/25/2018, 10:16 AM

## 2018-06-25 ENCOUNTER — Encounter (HOSPITAL_COMMUNITY): Payer: Self-pay | Admitting: Psychiatry

## 2018-06-25 ENCOUNTER — Ambulatory Visit (INDEPENDENT_AMBULATORY_CARE_PROVIDER_SITE_OTHER): Payer: 59 | Admitting: Psychiatry

## 2018-06-25 VITALS — BP 112/71 | HR 77 | Ht 72.0 in | Wt 209.0 lb

## 2018-06-25 DIAGNOSIS — F321 Major depressive disorder, single episode, moderate: Secondary | ICD-10-CM

## 2018-06-25 MED ORDER — SERTRALINE HCL 50 MG PO TABS: ORAL_TABLET | ORAL | 0 refills | Status: DC

## 2018-06-25 MED FILL — SERTRALINE HCL 50 MG TABLET: 50 | 30 days supply | Qty: 30 | Fill #0

## 2018-06-25 NOTE — Patient Instructions (Signed)
1. Decrease fluoxetine 20 mg daily for one week, then discontinue  2. Start sertraline 25 mg at night for one week, then 50 mg daily  3. Return to clinic in one month for 30 mins

## 2018-07-14 ENCOUNTER — Encounter: Payer: Self-pay | Admitting: *Deleted

## 2018-07-15 ENCOUNTER — Ambulatory Visit: Payer: Self-pay | Admitting: Family Medicine

## 2018-07-20 NOTE — Progress Notes (Deleted)
BH MD/PA/NP OP Progress Note  07/20/2018 1:03 PM Gabriel [name]  MRN:  [id number]  Chief Complaint:  HPI: *** Visit Diagnosis: No diagnosis found.  Past Psychiatric History: Please see initial evaluation for full details. I have reviewed the history. No updates at this time.   Past Medical History:  Past Medical History:  Diagnosis Date  . Heart murmur   . Seasonal allergies     Past Surgical History:  Procedure Laterality Date  . NO PAST SURGERIES      Family Psychiatric History: Please see initial evaluation for full details. I have reviewed the history. No updates at this time.     Family History:  Family History  Problem Relation Age of Onset  . Diabetes Father   . Hypertension Father   . Hypertension Mother   . Cancer Maternal Grandmother   . Cancer Maternal Grandfather   . Hypertension Maternal Grandfather   . Cancer Paternal Grandmother   . Hypertension Paternal Grandmother   . Cancer Paternal Grandfather   . Hypertension Paternal Grandfather     Social History:  Social History   Socioeconomic History  . Marital status: Married    Spouse name: Not on file  . Number of children: 2  . Years of education: 51  . Highest education level: High school graduate  Occupational History  . Not on file  Social Needs  . Financial resource strain: Somewhat hard  . Food insecurity:    Worry: Never true    Inability: Never true  . Transportation needs:    Medical: No    Non-medical: No  Tobacco Use  . Smoking status: Never Smoker  . Smokeless tobacco: Never Used  Substance and Sexual Activity  . Alcohol use: No    Alcohol/week: 0.0 standard drinks  . Drug use: No  . Sexual activity: Not on file  Lifestyle  . Physical activity:    Days per week: Not on file    Minutes per session: Not on file  . Stress: Very much  Relationships  . Social connections:    Talks on phone: Not on file    Gets together: Not on file    Attends religious service: Not on  file    Active member of club or organization: Not on file    Attends meetings of clubs or organizations: Not on file    Relationship status: Not on file  Other Topics Concern  . Not on file  Social History Narrative   He lives with wife and two children.   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    Allergies: No Known Allergies  Metabolic Disorder Labs: Lab Results  Component Value Date   HGBA1C 5.9 (H) 12/04/2016   MPG 123 12/04/2016   MPG 131 09/13/2015   No results found for: PROLACTIN Lab Results  Component Value Date   CHOL 156 02/18/2018   TRIG 61 02/18/2018   HDL 33 (L) 02/18/2018   CHOLHDL 4.7 02/18/2018   VLDL 10 12/04/2016   LDLCALC 108 (H) 02/18/2018   LDLCALC 98 12/04/2016   Lab Results  Component Value Date   TSH 2.11 07/16/2017   TSH 1.67 12/04/2016    Therapeutic Level Labs: No results found for: LITHIUM No results found for: VALPROATE No components found for:  CBMZ  Current Medications: Current Outpatient Medications  Medication Sig Dispense Refill  . ergocalciferol (VITAMIN D2) 50000 units capsule Take 1 capsule (50,000 Units  total) by mouth once a week. One capsule once weekly 12 capsule 1  . loratadine (CLARITIN) 10 MG tablet Take 1 tablet (10 mg total) by mouth daily. 90 tablet 1  . mometasone (NASONEX) 50 MCG/ACT nasal spray Place 2 sprays into the nose daily. 34 g 2  . naproxen (NAPROSYN) 500 MG tablet Take 1 tablet (500 mg total) by mouth 2 (two) times daily with a meal. 30 tablet 0  . sertraline (ZOLOFT) 50 MG tablet 25 mg at night for one week, then 50 mg at night 30 tablet 0  . temazepam (RESTORIL) 30 MG capsule Take 1 capsule (30 mg total) by mouth at bedtime as needed for sleep. 30 capsule 1   No current facility-administered medications for this visit.      Musculoskeletal: Strength & Muscle Tone: within normal limits Gait & Station: normal Patient leans: N/A  Psychiatric  Specialty Exam: ROS  There were no vitals taken for this visit.There is no height or weight on file to calculate BMI.  General Appearance: Fairly Groomed  Eye Contact:  Good  Speech:  Clear and Coherent  Volume:  Normal  Mood:  {BHH MOOD:22306}  Affect:  {Affect (PAA):22687}  Thought Process:  Coherent  Orientation:  Full (Time, Place, and Person)  Thought Content: Logical   Suicidal Thoughts:  {ST/HT (PAA):22692}  Homicidal Thoughts:  {ST/HT (PAA):22692}  Memory:  Immediate;   Good  Judgement:  {Judgement (PAA):22694}  Insight:  {Insight (PAA):22695}  Psychomotor Activity:  Normal  Concentration:  Concentration: Good and Attention Span: Good  Recall:  Good  Fund of Knowledge: Good  Language: Good  Akathisia:  No  Handed:  Right  AIMS (if indicated): not done  Assets:  Communication Skills Desire for Improvement  ADL's:  Intact  Cognition: WNL  Sleep:  {BHH GOOD/FAIR/POOR:22877}   Screenings: GAD-7     Office Visit from 01/28/2018 in [facility] Phone Follow Up from 01/08/2018 in [facility] Phone Follow Up from 12/04/2017 in [facility] Phone Follow Up from 10/24/2017 in [place] [facility] Visit from 09/08/2017 in [facility]  Total GAD-7 Score  15  10  15  11  14     PHQ2-9     Office Visit from 03/17/2018 in [facility] Office Visit from 01/28/2018 in [facility] Phone Follow Up from 01/08/2018 in [facility] Phone Follow Up from 12/04/2017 in [facility] Phone Follow Up from 10/24/2017 in [facility]  PHQ-2 Total Score  6  6  4  5  5   PHQ-9 Total Score  22  21  11  16  13        Assessment and Plan:  Gabriel [name] is a 43 y.o. year old male with a history of depression, bilateral carpel tunnel syndrome, who presents for follow up appointment for No diagnosis found.  # MDD, moderate,  single without psychotic features Patient continues to report depressive symptoms and anxiety despite up titration of fluoxetine.  Psychosocial stressors including unemployment, and the recent termination of job.  Will cross taper from fluoxetine to sertraline given he has limited benefit from fluoxetine.  Discussed potential GI side effect and serotonin syndrome.  Validated his feeling of loss.  Explored his value of connection with his family.  He is encouraged to continue to see Mr. Ernestine Conrad for therapy.   Plan 1. Decrease fluoxetine 20 mg daily  for one week, then discontinue  2. Start sertraline 25 mg at night for one week, then 50 mg daily  3. Return to clinic in one month for 30 mins - he is on temazepam 30 mg, xanax 0.5 mg, prescribed by PCP.  Past trials of medication:mirtazapine, nortriptyline for carpel tunnel syndrome  The patient demonstrates the following risk factors for suicide: Chronic risk factors for suicide include:psychiatric disorder ofdepression. Acute risk factorsfor suicide include: unemployment. Protective factorsfor this patient include: positive social support, responsibility to others (children, family), coping skills and hope for the future. Considering these factors, the overall suicide risk at this point appears to below. Patientisappropriate for outpatient follow up.  [name], MD 07/20/2018, 1:03 PM

## 2018-07-24 ENCOUNTER — Ambulatory Visit (HOSPITAL_COMMUNITY): Payer: 59 | Admitting: Psychiatry

## 2018-08-05 ENCOUNTER — Ambulatory Visit: Payer: Self-pay | Admitting: Family Medicine

## 2018-08-12 ENCOUNTER — Ambulatory Visit (INDEPENDENT_AMBULATORY_CARE_PROVIDER_SITE_OTHER): Payer: 59 | Admitting: Family Medicine

## 2018-08-12 ENCOUNTER — Other Ambulatory Visit: Payer: Self-pay

## 2018-08-12 ENCOUNTER — Encounter: Payer: Self-pay | Admitting: Family Medicine

## 2018-08-12 VITALS — Ht 72.0 in | Wt 209.0 lb

## 2018-08-12 DIAGNOSIS — F332 Major depressive disorder, recurrent severe without psychotic features: Secondary | ICD-10-CM

## 2018-08-12 DIAGNOSIS — J3089 Other allergic rhinitis: Secondary | ICD-10-CM

## 2018-08-12 DIAGNOSIS — F5104 Psychophysiologic insomnia: Secondary | ICD-10-CM

## 2018-08-12 MED ORDER — LORATADINE 10 MG PO TABS: 10.0000 mg | ORAL_TABLET | Freq: Every day | ORAL | 3 refills | Status: DC

## 2018-08-12 NOTE — Patient Instructions (Addendum)
Thank you for allowing your visit to be via telemedicine. I appreciate the opportunity to provide you with the care for your health and wellness. Today we discussed: mood and overall wellbeing. I detail below some of the techniques we discussed during the telemedicine visit today. Please do not hesitate to call the office, if you need to discuss these in more detail, or if you need anything we can possibly help you with.   Exercise is important for not only our bodies, but our minds. It also allows [place] to have change of scenery, relaxation with deep breathing and reduction of the "white noise" in our minds. It gives [place] a chance to bring our thoughts back to the present moment. I highly suggest walking daily outside for 20-30 minutes. (Continue to practice the social distancing). Yoga and calisthenics are something that can be done indoors, on rainy days or if not up for walking, via [employer].   Sleep hygiene and routine are vital to helping to reset our biological clocks, especially during times of stress. As we discussed today, it is helpful to develop a pattern, as this helps our brains know when we want to rest and when to be awake. This routine you come up with should start at least an hour before bed. It can include, but is not limited to, the last overall look at your electronic devices, reading, yoga, meditation, drinking warm fluids: tea, warm shower, facial and dental care. All these can set you up to get ready to rest. Find what feels good and best to you and start working on that nightly.  It is best to refrain from doing activities in the bed, such as reading, watching tv, or looking at phone, so that your brain does not associate your sleep time with awake time. Try placing a chair in your room for reading or go to another room if you can not sleep. I have attached a sleep hygiene worksheet.   We discussed grounding yourself with your senses. When you are feeling overwhelmed with  thoughts or can't rest or are fretting, ask yourself these questions:  What do I see? What do I hear? What do I smell? What do I taste? What do I feel (touch)?   Use your sense to bring you back to the moment and re-center yourself.   Daily try looking in the mirror and state one or all of these affirmations to yourself. Look at yourself in the eyes when you do this. I am capable. I am able. I am strong. I am enough. I can move forward. I will not hold myself back. I will not let others hold me back.   Remember:   WASH YOUR HANDS WELL AND FREQUENTLY. AVOID TOUCHING YOUR FACE, UNLESS YOUR HANDS ARE FRESHLY WASHED.  GET FRESH AIR DAILY. STAY HYDRATED WITH WATER.   It was a pleasure to see you and I look forward to continuing to work together on your health and well-being. Please do not hesitate to call the office if you need care or have questions about your care.  Have a wonderful day and week.  With [employer], Tereasa Coop, DNP, AGNP-BC  Sleep: It is important. Who needs what about of sleep: Teens need about 9 hours of sleep a night. Younger children need more sleep (10-11 hours a night) and adults need slightly less (7-9 hours each night). 11 Tips to Follow: 1. No caffeine after 3pm: Avoid beverages with caffeine (soda, tea, energy drinks, etc.)  especially after 3pm.  2. Don't go to bed hungry: Have your evening meal at least 3 hrs. before going to sleep. It's fine to have a small bedtime snack such as a glass of milk and a few crackers but don't have a big meal.  3. Have a nightly routine before bed: Plan on "winding down" before you go to sleep. Begin relaxing about 1 hour before you go to bed. Try doing a quiet activity such as listening to calming music, reading a book or meditating.  4. Turn off the TV and ALL electronics including video games, tablets, laptops, etc. 1 hour before sleep, and keep them out of the bedroom.  5. Turn off your cell phone and all  notifications (new email and text alerts) or even better, leave your phone outside your room while you sleep. Studies have shown that a part of your brain continues to respond to certain lights and sounds even while you're still asleep.  6. Make your bedroom quiet, dark and cool. If you can't control the noise, try wearing earplugs or using a fan to block out other sounds.  7. Practice relaxation techniques. Try reading a book or meditating or drain your brain by writing a list of what you need to do the next day.  8. Don't nap unless you feel sick: you'll have a better night's sleep.  9. Don't smoke, or quit if you do. Nicotine, alcohol, and marijuana can all keep you awake. Talk to your health care provider if you need help with substance use.  10. Most importantly, wake up at the same time every day (or within 1 hour of your usual wake up time) EVEN on the weekends. A regular wake up time promotes sleep hygiene and prevents sleep problems.  11. Reduce exposure to bright light in the last three hours of the day before going to sleep.  Maintaining good sleep hygiene and having good sleep habits lower your risk of developing sleep problems. Getting better sleep can also improve your concentration and alertness. Try the simple steps in this guide. If you still have trouble getting enough rest, make an appointment with your health care provider.

## 2018-08-12 NOTE — Progress Notes (Addendum)
Established Patient Office Visit  Subjective:  Patient ID: Gabriel Settingerrence Skelton, male    DOB: 07/16/1975  Age: 43 y.o. MRN: [id number]  CC:  Chief Complaint  Patient presents with  . Depression   Location of Patient: Home Location of Provider: Telehealth Consent was obtain for visit to be over via telehealth.   HPI Gabriel [name] presents for follow-up on his conditions.  Previously seen in November by Dr. Lodema HongSimpson.  Was referred to psychiatry at that time due to ongoing severe depression related to job loss earlier in 2019.  Is being followed at this time by behavioral health, Dr. Vanetta ShawlHisada.  She changed him from Prozac to Zoloft to see if that would help with some of his depression.  He honestly does not feel like it is working at this time.  He is still having lots of chronic constant thinking and things going through his mind.  He feels still overwhelmed with emotions regarding this loss.  Was recently treated for atopic eczema, feels that this is getting better.  Has no complaints about it at this time.  Overall he has no other complaints except for his allergies he needs to have his Claritin refilled.  Denies having any signs or symptoms of any infections or breathing difficulties at this time during the telehealth phone visit.  Past Medical, Surgical, Social History, Allergies, and Medications have been Reviewed.   Past Medical History:  Diagnosis Date  . Heart murmur   . Seasonal allergies       Family History  Problem Relation Age of Onset  . Diabetes Father   . Hypertension Father   . Hypertension Mother   . Cancer Maternal Grandmother   . Cancer Maternal Grandfather   . Hypertension Maternal Grandfather   . Cancer Paternal Grandmother   . Hypertension Paternal Grandmother   . Cancer Paternal Grandfather   . Hypertension Paternal Grandfather     Social History   Socioeconomic History  . Marital status: Married    Spouse name: Not on file  . Number of  children: 2  . Years of education: 3012  . Highest education level: High school graduate  Occupational History  . Not on file  Social Needs  . Financial resource strain: Somewhat hard  . Food insecurity:    Worry: Never true    Inability: Never true  . Transportation needs:    Medical: No    Non-medical: No  Tobacco Use  . Smoking status: Never Smoker  . Smokeless tobacco: Never Used  Substance and Sexual Activity  . Alcohol use: No    Alcohol/week: 0.0 standard drinks  . Drug use: No  . Sexual activity: Not on file  Lifestyle  . Physical activity:    Days per week: Not on file    Minutes per session: Not on file  . Stress: Very much  Relationships  . Social connections:    Talks on phone: Not on file    Gets together: Not on file    Attends religious service: Not on file    Active member of club or organization: Not on file    Attends meetings of clubs or organizations: Not on file    Relationship status: Not on file  . Intimate partner violence:    Fear of current or ex partner: Not on file    Emotionally abused: Not on file    Physically abused: Not on file    Forced sexual activity: Not on file  Other Topics Concern  .  Not on file  Social History Narrative   He lives with wife and two children.   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    Outpatient Medications Prior to Visit  Medication Sig Dispense Refill  . mometasone (NASONEX) 50 MCG/ACT nasal spray Place 2 sprays into the nose daily. 34 g 2  . naproxen (NAPROSYN) 500 MG tablet Take 1 tablet (500 mg total) by mouth 2 (two) times daily with a meal. 30 tablet 0  . sertraline (ZOLOFT) 50 MG tablet 25 mg at night for one week, then 50 mg at night 30 tablet 0  . temazepam (RESTORIL) 30 MG capsule Take 1 capsule (30 mg total) by mouth at bedtime as needed for sleep. 30 capsule 1  . loratadine (CLARITIN) 10 MG tablet Take 1 tablet (10 mg total) by mouth daily. 90  tablet 1  . ergocalciferol (VITAMIN D2) 50000 units capsule Take 1 capsule (50,000 Units total) by mouth once a week. One capsule once weekly 12 capsule 1   No facility-administered medications prior to visit.     No Known Allergies  ROS Review of Systems  Constitutional: Negative for activity change, appetite change, chills and fever.  HENT: Positive for congestion and rhinorrhea. Negative for sinus pressure and sinus pain.   Eyes: Negative.   Respiratory: Negative for cough, chest tightness and shortness of breath.   Cardiovascular: Negative for chest pain.  Gastrointestinal: Negative.   Endocrine: Negative.   Genitourinary: Negative.   Musculoskeletal: Negative.   Skin: Negative.   Allergic/Immunologic: Positive for environmental allergies.  Neurological: Negative for dizziness and headaches.  Hematological: Negative.   Psychiatric/Behavioral: Positive for decreased concentration and sleep disturbance. The patient is nervous/anxious.       Objective:    Physical Exam  Psychiatric: His speech is normal. Judgment and thought content normal. Cognition and memory are normal. He exhibits a depressed mood.    There were no vitals taken for this visit. Wt Readings from Last 3 Encounters:  06/25/18 209 lb (94.8 kg)  03/25/18 203 lb (92.1 kg)  03/17/18 206 lb 1.3 oz (93.5 kg)    Lab Results  Component Value Date   TSH 2.11 07/16/2017   Lab Results  Component Value Date   WBC 4.4 02/18/2018   HGB 14.3 02/18/2018   HCT 42.8 02/18/2018   MCV 85.4 02/18/2018   PLT 389 02/18/2018   Lab Results  Component Value Date   NA 140 02/18/2018   K 4.1 02/18/2018   CO2 30 02/18/2018   GLUCOSE 99 02/18/2018   BUN 16 02/18/2018   CREATININE 1.14 02/18/2018   BILITOT 0.5 02/18/2018   ALKPHOS 82 06/14/2007   AST 36 02/18/2018   ALT 54 (H) 02/18/2018   PROT 6.7 02/18/2018   ALBUMIN 4.0 06/14/2007   CALCIUM 9.2 02/18/2018   Lab Results  Component Value Date   CHOL 156  02/18/2018   Lab Results  Component Value Date   HDL 33 (L) 02/18/2018   Lab Results  Component Value Date   LDLCALC 108 (H) 02/18/2018   Lab Results  Component Value Date   TRIG 61 02/18/2018   Lab Results  Component Value Date   CHOLHDL 4.7 02/18/2018   Lab Results  Component Value Date   HGBA1C 5.9 (H) 12/04/2016      Assessment & Plan:   1. Severe episode of recurrent major depressive disorder, without psychotic features (HCC) Extensive conversation today via  telemedicine on current depression situation.  Reports that he has ongoing severe depression even with the transition from Prozac to Zoloft.  Does not feel honestly that is making him feel much better.  When asked what he is doing for himself he reports that he is reading, looking for a job but this pandemic is currently making things worse for him.  Reports that he is constantly thinking, is unable to process or relax.   Provided him with detail grounding techniques, breathing techniques, perspective transitioning techniques. These are also detailed in the AVS which will be mailed to him.  Again strongly encouraged him to implement some of these nonpharmacological measures to see if that helps.  Dr. Vanetta Shawl, is currently managing his medications with this.  Will not be adjusting any of his medications at this time.  Encouraged him to implement some of the above strategies to see if that also helps with combination of medication and therapy.  Would possibly consider adjustment of medication once he is tried some nonpharmacological adjustments.  Appreciate the collaboration in his care.  Patient acknowledged agreement and understanding of the plan.    2. Psychophysiological insomnia Currently is not resting well.  Is taking the Restoril without much help.  Reports that he just cannot quiet his mind.  When he does rest, he does not feel rested.  Due to the nature of the medication not working.  Could possibly try trazodone.   This might help, but would need to possibly look at whether or not to stay on Zoloft.  Again will refer to Dr. Vanetta Shawl for this current medication treatment plan.  Appreciate collaboration in his care. Patient acknowledged agreement and understanding of the plan.    3. Perennial allergic rhinitis Allergies are flaring up, needs refill of Claritin will send this in.  Reviewed side effects, risks and benefits of medication.   Patient acknowledged agreement and understanding of the plan.   - loratadine (CLARITIN) 10 MG tablet; Take 1 tablet (10 mg total) by mouth daily.  Dispense: 90 tablet; Refill: 3  I provided 30 minutes of non-face-to-face time during this encounter.  Follow-up:  3 months.  Does see behavioral health prior to this 30-month timeframe.  Advised that if he needs to see [place] at any time before the 33-month, just call office.   Freddy Finner, NP

## 2018-08-26 MED FILL — SM LORATADINE 10 MG TABS: 10 | 90 days supply | Qty: 90 | Fill #0
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
## 2018-10-12 ENCOUNTER — Encounter (HOSPITAL_COMMUNITY): Payer: Self-pay | Admitting: Psychiatry

## 2018-10-12 ENCOUNTER — Other Ambulatory Visit: Payer: Self-pay

## 2018-10-12 ENCOUNTER — Ambulatory Visit (INDEPENDENT_AMBULATORY_CARE_PROVIDER_SITE_OTHER): Payer: 59 | Admitting: Psychiatry

## 2018-10-12 DIAGNOSIS — F321 Major depressive disorder, single episode, moderate: Secondary | ICD-10-CM | POA: Diagnosis not present

## 2018-10-12 MED ORDER — TRAZODONE HCL 50 MG PO TABS: ORAL_TABLET | ORAL | 0 refills | Status: DC

## 2018-10-12 MED ORDER — VENLAFAXINE HCL ER 37.5 MG PO CP24: ORAL_CAPSULE | ORAL | 0 refills | Status: DC

## 2018-10-12 MED ORDER — VENLAFAXINE HCL ER 150 MG PO CP24: ORAL_CAPSULE | ORAL | 0 refills | Status: DC

## 2018-10-12 MED FILL — traZODone HCL 50 MG TABS: 50 | 30 days supply | Qty: 30 | Fill #0

## 2018-10-12 MED FILL — VENLAFAXINE HCL ER 37.5 MG: 37.5 | 14 days supply | Qty: 21 | Fill #0

## 2018-10-12 NOTE — Patient Instructions (Signed)
1. Start venlafaxine 37.5 mg daily for one week, then 75 mg daily for one week, then 150 mg daily 2. Start Trazodone 25-50 mg at night as needed for sleep 3. Next appointment: 6/29 at 3 PM 4. Hold sertraline 5. I would advise that you contact Mr. Swan for therapy follow up

## 2018-10-13 MED FILL — TRETINOIN 0.025% CREAM: 0.025 | 30 days supply | Qty: 45 | Fill #1

## 2018-10-13 MED FILL — KETOCONAZOLE 2 % SHAM: 2 | 30 days supply | Qty: 120 | Fill #1

## 2018-10-28 ENCOUNTER — Ambulatory Visit: Payer: 59 | Admitting: Family Medicine

## 2018-10-28 ENCOUNTER — Encounter: Payer: Self-pay | Admitting: Family Medicine

## 2018-10-28 ENCOUNTER — Other Ambulatory Visit: Payer: Self-pay

## 2018-10-28 VITALS — BP 110/70 | HR 75 | Resp 12 | Ht 72.0 in | Wt 218.0 lb

## 2018-10-28 DIAGNOSIS — F322 Major depressive disorder, single episode, severe without psychotic features: Secondary | ICD-10-CM

## 2018-10-28 DIAGNOSIS — E663 Overweight: Secondary | ICD-10-CM | POA: Diagnosis not present

## 2018-10-28 DIAGNOSIS — F5104 Psychophysiologic insomnia: Secondary | ICD-10-CM | POA: Diagnosis not present

## 2018-10-28 DIAGNOSIS — L608 Other nail disorders: Secondary | ICD-10-CM

## 2018-10-28 DIAGNOSIS — F411 Generalized anxiety disorder: Secondary | ICD-10-CM | POA: Diagnosis not present

## 2018-10-28 DIAGNOSIS — F419 Anxiety disorder, unspecified: Secondary | ICD-10-CM | POA: Diagnosis not present

## 2018-10-28 NOTE — Patient Instructions (Addendum)
Annual physical exam with MD early August , call if you need me before  New commitments 30 minutes exercise with family every day make it fun ( walk in the park) Ask spouse to help you when you  Play games with kids 30 mins, to 1 hour every day Look for work in new areas that you had not thought of Limit negative feeds, go for positive feeds, became a part of the positive feed to stay down, start rising  New therapist is being accessed , referral sent   Your  DNA is one that has you as a hard working , functional gentleman, that is who you are. Decide NOT to stay down  Darkening of nails are often seen in pigmented skin, please have your dermatologist address this

## 2018-11-01 ENCOUNTER — Encounter: Payer: Self-pay | Admitting: Family Medicine

## 2018-11-01 DIAGNOSIS — L608 Other nail disorders: Secondary | ICD-10-CM | POA: Insufficient documentation

## 2018-11-01 NOTE — Progress Notes (Signed)
   Gabriel [name]     MRN: [id number]      DOB: 04-03-1976   HPI Gabriel [name] is here for follow up and re-evaluation of chronic medical conditions, medication management and review of any available recent lab and radiology data.  Preventive health is updated, specifically  Cancer screening and Immunization.   I s being treated by Psychiatry for disabling  Depression, not much progress unfortunately, and he finds the current climate intensely disturbing and relates to it on a very personal level He is not suicidal or homicidal Has started a few therapy session, though none this year, will change to lcal therapist and hope he can relate better  ROS Denies recent fever or chills. Denies sinus pressure, nasal congestion, ear pain or sore throat. Denies chest congestion, productive cough or wheezing. Denies chest pains, palpitations and leg swelling Denies abdominal pain, nausea, vomiting,diarrhea or constipation.   Denies dysuria, frequency, hesitancy or incontinence. Denies joint pain, swelling and limitation in mobility. Denies headaches, seizures, numbness, or tingling. C/o discoloration of toe nails No regular exercise, struggling with becoming motivated  PE  BP 110/70   Pulse 75   Resp 12   Ht 6' (1.829 m)   Wt 218 lb 0.6 oz (98.9 kg)   SpO2 97%   BMI 29.57 kg/m   Patient alert and oriented and in no cardiopulmonary distress.  HEENT: No facial asymmetry, EOMI,   oropharynx pink and moist.  Neck supple no JVD, no mass.  Chest: Clear to auscultation bilaterally.  CVS: S1, S2 no murmurs, no S3.Regular rate.  ABD: Soft non tender.   Ext: No edema  MS: Adequate ROM spine, shoulders, hips and knees.  Skin: Intact, no ulcerations or rash noted.hyperpigmented lines and hyperpigmentation of toenails bilaterally esp great toe nails  Psych: Good eye contact, normal affect. Memory intact not anxious or depressed appearing.  CNS: CN 2-12 intact, power,  normal throughout.no  focal deficits noted.   Assessment & Plan  Depression, major, single episode, severe ([place]) Will alert Psych as well as refer to local therapist, not suicidal or homicidal Encouraged pt to take control of his life and take the turn that he needs.Therspy crucial for his forward movement  Anxiety [employer] environment feeds into his persional situation. Needs therapy , also will alert psych  Insomnia Sleep hygiene reviewed, med management by Psych, still reports poor sleep  Discolored nails Recommend observation, Derm eval also offered  Overweight (BMI 25.0-29.9)  Patient re-educated about  the importance of commitment to a  minimum of 150 minutes of exercise per week as able.  The importance of healthy food choices with portion control discussed, as well as eating regularly and within a 12 hour window most days. The need to choose "clean , green" food 50 to 75% of the time is discussed, as well as to make water the primary drink and set a goal of 64 ounces water daily.    Weight /BMI 10/28/2018 08/12/2018 03/17/2018  WEIGHT 218 lb 0.6 oz 209 lb 206 lb 1.3 oz  HEIGHT 6\' 0"  6\' 0"  6\' 0"   BMI 29.57 kg/m2 28.35 kg/m2 27.95 kg/m2  Some encounter information is confidential and restricted. Go to Review Flowsheets activity to see all data.

## 2018-11-01 NOTE — Assessment & Plan Note (Signed)
[employer] environment feeds into his persional situation. Needs therapy , also will alert psych

## 2018-11-01 NOTE — Assessment & Plan Note (Signed)
Sleep hygiene reviewed, med management by Psych, still reports poor sleep

## 2018-11-01 NOTE — Assessment & Plan Note (Signed)
Recommend observation, Derm eval also offered

## 2018-11-01 NOTE — Assessment & Plan Note (Signed)
  Patient re-educated about  the importance of commitment to a  minimum of 150 minutes of exercise per week as able.  The importance of healthy food choices with portion control discussed, as well as eating regularly and within a 12 hour window most days. The need to choose "clean , green" food 50 to 75% of the time is discussed, as well as to make water the primary drink and set a goal of 64 ounces water daily.    Weight /BMI 10/28/2018 08/12/2018 03/17/2018  WEIGHT 218 lb 0.6 oz 209 lb 206 lb 1.3 oz  HEIGHT 6\' 0"  6\' 0"  6\' 0"   BMI 29.57 kg/m2 28.35 kg/m2 27.95 kg/m2  Some encounter information is confidential and restricted. Go to Review Flowsheets activity to see all data.

## 2018-11-01 NOTE — Assessment & Plan Note (Signed)
Will alert Psych as well as refer to local therapist, not suicidal or homicidal Encouraged pt to take control of his life and take the turn that he needs.Therspy crucial for his forward movement

## 2018-11-03 NOTE — Progress Notes (Signed)
Virtual Visit via Video Note  I connected with Gabriel [name] on 11/09/18 at  3:00 PM EDT by a video enabled telemedicine application and verified that I am speaking with the correct person using two identifiers.   I discussed the limitations of evaluation and management by telemedicine and the availability of in person appointments. The patient expressed understanding and agreed to proceed.     I discussed the assessment and treatment plan with the patient. The patient was provided an opportunity to ask questions and all were answered. The patient agreed with the plan and demonstrated an understanding of the instructions.   The patient was advised to call back or seek an in-person evaluation if the symptoms worsen or if the condition fails to improve as anticipated.  I provided 25 minutes of non-face-to-face time during this encounter.   Neysa Hottereina Shalonda Sachse, MD    [facility] MD/PA/NP OP Progress Note  11/09/2018 3:33 PM Gabriel [name]  MRN:  [id number]  Chief Complaint:  Chief Complaint    Depression; Follow-up     HPI:  This is a follow-up appointment for depression.  He states that he has been trying his best to do more things outside with his children. He continues to feel "disappointment, resentment" against his prior employer, and the movement in the world makes him feel "disheartening." He reports that he has trust issues due to the incident.  Although he states that these thoughts are back of his mind every day, it has been easier for him to handle. 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 He agrees to try taking balance between them. He has insomnia. He feels down. He has fair concentration.  He denies SI.  He feels anxious and tense. He denies panic attacks. He felt drowsy after taking trazodone.    Visit Diagnosis:    ICD-10-CM   1. 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 I have reviewed the history. No updates at this time.     Past Medical History:  Past Medical History:  Diagnosis Date  . Heart murmur   . Seasonal allergies     Past Surgical History:  Procedure Laterality Date  . NO PAST SURGERIES      Family Psychiatric History: Please see initial evaluation for full details. I have reviewed the history. No updates at this time.     Family History:  Family History  Problem Relation Age of Onset  . Diabetes Father   . Hypertension Father   . Hypertension Mother   . Cancer Maternal Grandmother   . Cancer Maternal Grandfather   . Hypertension Maternal Grandfather   . Cancer Paternal Grandmother   . Hypertension Paternal Grandmother   . Cancer Paternal Grandfather   . Hypertension Paternal Grandfather     Social History:  Social History   Socioeconomic History  . Marital status: Married    Spouse name: Not on file  . Number of children: 2  . Years of education: 5312  . Highest education level: High school graduate  Occupational History  . Not on file  Social Needs  . Financial resource strain: Somewhat hard  . Food insecurity    Worry: Never  true    Inability: Never true  . Transportation needs    Medical: No    Non-medical: No  Tobacco Use  . Smoking status: Never Smoker  . Smokeless tobacco: Never Used  Substance and Sexual Activity  . Alcohol use: No    Alcohol/week: 0.0 standard drinks  . Drug use: No  . Sexual activity: Not on file  Lifestyle  . Physical activity    Days per week: Not on file    Minutes per session: Not on file  . Stress: Very much  Relationships  . Social Musicianconnections    Talks on phone: Not on file    Gets together: Not on file    Attends religious service: Not on file    Active member of club or organization:  Not on file    Attends meetings of clubs or organizations: Not on file    Relationship status: Not on file  Other Topics Concern  . Not on file  Social History Narrative   He lives with wife and two children.   He works as Agricultural consultantdistrict manager at [employer]   Highest level of education:  12th grade    Allergies: No Known Allergies  Metabolic Disorder Labs: Lab Results  Component Value Date   HGBA1C 5.9 (H) 12/04/2016   MPG 123 12/04/2016   MPG 131 09/13/2015   No results found for: PROLACTIN Lab Results  Component Value Date   CHOL 156 02/18/2018   TRIG 61 02/18/2018   HDL 33 (L) 02/18/2018   CHOLHDL 4.7 02/18/2018   VLDL 10 12/04/2016   LDLCALC 108 (H) 02/18/2018   LDLCALC 98 12/04/2016   Lab Results  Component Value Date   TSH 2.11 07/16/2017   TSH 1.67 12/04/2016    Therapeutic Level Labs: No results found for: LITHIUM No results found for: VALPROATE No components found for:  CBMZ  Current Medications: Current Outpatient Medications  Medication Sig Dispense Refill  . hydrocortisone 2.5 % cream Apply 1 application topically as directed.    Marland Kitchen. ketoconazole (NIZORAL) 2 % shampoo Apply 1 application topically as directed.    . loratadine (CLARITIN) 10 MG tablet Take 1 tablet (10 mg total) by mouth daily. 90 tablet 3  . mometasone (NASONEX) 50 MCG/ACT nasal spray Place 2 sprays into the nose daily. 34 g 2  . naproxen (NAPROSYN) 500 MG tablet Take 1 tablet (500 mg total) by mouth 2 (two) times daily with a meal. 30 tablet 0  . traZODone (DESYREL) 50 MG tablet 25-50 mg at night as needed for sleep 30 tablet 0  . tretinoin (RETIN-A) 0.025 % cream Apply 1 application topically at bedtime.    Marland Kitchen. venlafaxine XR (EFFEXOR-XR) 150 MG 24 hr capsule 225 mg daily (75 mg +150 mg ) 30 capsule 1  . venlafaxine XR (EFFEXOR-XR) 75 MG 24 hr capsule 225 mg daily (75 mg +150 mg ) 30 capsule 1  . zolpidem (AMBIEN) 5 MG tablet Take 1 tablet (5 mg total) by mouth at bedtime as needed for  sleep. 30 tablet 1   No current facility-administered medications for this visit.      Musculoskeletal: Strength & Muscle Tone: N/A Gait & Station: N/A Patient leans: N/A  Psychiatric Specialty Exam: Review of Systems  Psychiatric/Behavioral: Positive for depression. Negative for hallucinations, memory loss, substance abuse and suicidal ideas. The patient is nervous/anxious and has insomnia.   All other systems reviewed and are negative.   There were no vitals taken for this visit.There  is no height or weight on file to calculate BMI.  General Appearance: Fairly Groomed  Eye Contact:  Good  Speech:  Clear and Coherent  Volume:  Normal  Mood:  Depressed  Affect:  Appropriate, Congruent, Restricted and Tearful  Thought Process:  Coherent  Orientation:  Full (Time, Place, and Person)  Thought Content: Logical   Suicidal Thoughts:  No  Homicidal Thoughts:  No  Memory:  Immediate;   Good  Judgement:  Good  Insight:  Good  Psychomotor Activity:  Normal  Concentration:  Concentration: Good and Attention Span: Good  Recall:  Good  Fund of Knowledge: Good  Language: Good  Akathisia:  No  Handed:  Right  AIMS (if indicated): not done  Assets:  Communication Skills Desire for Improvement  ADL's:  Intact  Cognition: WNL  Sleep:  Poor   Screenings: GAD-7     Office Visit from 10/28/2018 in [facility] Office Visit from 01/28/2018 in [facility] Phone Follow Up from 01/08/2018 in [place] [facility] Phone Follow Up from 12/04/2017 in [facility] Phone Follow Up from 10/24/2017 in [facility]  Total GAD-7 Score  20  15  10  15  11     PHQ2-9     Office Visit from 10/28/2018 in [facility] Office Visit from 08/12/2018 in [facility] Office Visit from 03/17/2018 in [facility] Office Visit from 01/28/2018 in [facility] Phone Follow Up from 01/08/2018  in [facility]  PHQ-2 Total Score  3  6  6  6  4   PHQ-9 Total Score  18  14  22  21  11        Assessment and Plan:  Sriansh Farra is a 43 y.o. year old male with a history of depression,,bilateral carpel tunnel syndrome , who presents for follow up appointment for depression.   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  Other psychosocial stressors includes recent incident in [place].  Will uptitrate venlafaxine to target depression.  Discussed potential risk of hypertension and headache.  Will hold trazodone given drowsiness.  Will try Ambien for insomnia; discussed risk of dependence and oversedation.  Discussed behavioral activation.  Coached cognitive defusion. He is scheduled to see Ms. Bynum to transfer therapy as he prefers closer location (appointment made by PCP)  Plan 1. Increase venlafaxine 225 mg (150 mg + 75 mg) daily 2. Discontinue Trazodone 3. Start Ambien 5 mg at night as needed for insomnia 3. Next appointment: 8/4 at 3:50 for 30 mins, video (he is not taking temazepam, xanax anymore)  Past trials of medication:sertraline, fluoxetine, mirtazapine, nortriptyline for carpel tunnel syndrome  The patient demonstrates the following risk factors for suicide: Chronic risk factors for suicide include: psychiatric disorder of depression. Acute risk factors for suicide include: unemployment and loss (financial, interpersonal, professional). Protective factors for this patient include: positive social support, responsibility to others (children, family) and hope for the future. Considering these factors, the overall suicide risk at this point appears to be low. Patient is appropriate for  outpatient follow up.  The duration of this appointment visit was 25 minutes of non face-to-face time with the patient.  Greater than 50% of this time was spent in counseling,  explanation of  diagnosis, planning of further management, and coordination of care.  Neysa Hottereina Dakwan Pridgen, MD 11/09/2018, 3:33 PM

## 2018-11-09 ENCOUNTER — Ambulatory Visit (INDEPENDENT_AMBULATORY_CARE_PROVIDER_SITE_OTHER): Payer: 59 | Admitting: Psychiatry

## 2018-11-09 ENCOUNTER — Other Ambulatory Visit: Payer: Self-pay

## 2018-11-09 ENCOUNTER — Encounter (HOSPITAL_COMMUNITY): Payer: Self-pay | Admitting: Psychiatry

## 2018-11-09 DIAGNOSIS — F321 Major depressive disorder, single episode, moderate: Secondary | ICD-10-CM

## 2018-11-09 MED ORDER — VENLAFAXINE HCL ER 75 MG PO CP24: ORAL_CAPSULE | ORAL | 1 refills | Status: DC

## 2018-11-09 MED ORDER — VENLAFAXINE HCL ER 150 MG PO CP24: ORAL_CAPSULE | ORAL | 1 refills | Status: DC

## 2018-11-09 MED ORDER — ZOLPIDEM TARTRATE 5 MG PO TABS: 5.0000 mg | ORAL_TABLET | Freq: Every evening | ORAL | 1 refills | Status: DC | PRN

## 2018-11-09 MED FILL — VENLAFAXINE HCL ER 150 MG C: 150 | 30 days supply | Qty: 30 | Fill #0

## 2018-11-09 MED FILL — ZOLPIDEM TARTRATE 5 MG TAB: 5 | 30 days supply | Qty: 30 | Fill #0

## 2018-11-09 MED FILL — VENLAFAXINE HCL ER 75 MG CA: 75 | 30 days supply | Qty: 30 | Fill #0

## 2018-11-09 NOTE — Patient Instructions (Signed)
1. Increase venlafaxine 225 mg (150 mg + 75 mg) daily 2. Discontinue Trazodone 3. Start Ambien 5 mg at night as needed for insomnia 3. Next appointment: 8/4 at 3:50

## 2018-11-11 ENCOUNTER — Ambulatory Visit: Payer: Self-pay | Admitting: Family Medicine

## 2018-11-19 ENCOUNTER — Ambulatory Visit (HOSPITAL_COMMUNITY): Payer: 59 | Admitting: Psychiatry

## 2018-11-23 ENCOUNTER — Other Ambulatory Visit: Payer: Self-pay | Admitting: Family Medicine

## 2018-11-25 ENCOUNTER — Encounter: Payer: Self-pay | Admitting: Family Medicine

## 2018-11-26 ENCOUNTER — Other Ambulatory Visit (HOSPITAL_COMMUNITY): Payer: Self-pay | Admitting: *Deleted

## 2018-12-03 ENCOUNTER — Encounter (HOSPITAL_COMMUNITY): Payer: Self-pay | Admitting: Psychiatry

## 2018-12-03 ENCOUNTER — Ambulatory Visit (INDEPENDENT_AMBULATORY_CARE_PROVIDER_SITE_OTHER): Payer: 59 | Admitting: Psychiatry

## 2018-12-03 ENCOUNTER — Other Ambulatory Visit: Payer: Self-pay

## 2018-12-03 DIAGNOSIS — F321 Major depressive disorder, single episode, moderate: Secondary | ICD-10-CM | POA: Diagnosis not present

## 2018-12-03 NOTE — Progress Notes (Signed)
Virtual Visit via Video Note  I connected with Gabriel [name] on 12/03/18 at  9:00 AM EDT by a video enabled telemedicine application and verified that I am speaking with the correct person using two identifiers.   I discussed the limitations of evaluation and management by telemedicine and the availability of in person appointments. The patient expressed understanding and agreed to proceed.  I provided 60 minutes of non-face-to-face time during this encounter.   [name], LCSW    Comprehensive Clinical Assessment (CCA) Note  12/03/2018 Gabriel [name] [id number]  Visit Diagnosis:      ICD-10-CM   1. Current moderate episode of major depressive disorder without prior episode ([facility])  F32.1       CCA Part One  Part One has been completed on paper by the patient.  (See scanned document in Chart Review)  CCA Part Two A  Intake/Chief Complaint:  CCA Intake With Chief Complaint CCA Part Two Date: 12/03/18 CCA Part Two Time: 0915 Chief Complaint/Presenting Problem: ""I lost my job because I complained about race discrimination about a year ago. 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 That was really hard for me, I am the breadwinner for the family. I felt like I failed my family and let me down. I t has been a tough hurdle to come over. I been trying lto engage with my wife and children. I had it all and I don't have anymore. I put my family on the back burner for this job. I have trust issues and don't want to be around people" Patients Currently Reported Symptoms/Problems: social withdrawal, like to be by myself, ruminating about job and past, sleep difficulty, anxiety , 4-5 hours of sleep, excessive worry about family, fears he will lose family Individual's Strengths: "reaching out for help" Type of Services Patient Feels Are Needed: Individual therapy Initial Clinical Notes/Concerns: Patient is referred for services by PCP Dr. Tula Nakayama due to patient  experiencing symptoms of depression. He denies any psychiatric hospitalizations, He saw therapist Youlanda Roys in [place] for a few months.  Mental Health Symptoms Depression:  Depression: Difficulty Concentrating, Fatigue, Hopelessness, Increase/decrease in appetite, Irritability, Sleep (too much or little), Tearfulness, Worthlessness, Change in energy/activity  Mania:  Mania: N/A  Anxiety:   Anxiety: Difficulty concentrating, Fatigue, Irritability, Restlessness, Sleep, Tension, Worrying  Psychosis:  Psychosis: N/A  Trauma:  Trauma: N/A  Obsessions:  Obsessions: N/A  Compulsions:  Compulsions: N/A  Inattention:  Inattention: N/A  Hyperactivity/Impulsivity:  Hyperactivity/Impulsivity: N/A  Oppositional/Defiant Behaviors:    Borderline Personality:  Emotional Irregularity: N/A  Other Mood/Personality Symptoms:     Mental Status Exam Appearance and self-care  Stature:    Weight:    Clothing:  Clothing: Casual  Grooming:  Grooming: Normal  Cosmetic use:    Posture/gait:    Motor activity:    Sensorium  Attention:  Attention: Normal  Concentration:  Concentration: Anxiety interferes  Orientation:  Orientation: X5  Recall/memory:  Recall/Memory: Defective in immediate, Defective in Recent, Defective in Remote  Affect and Mood  Affect:  Affect: Anxious, Depressed  Mood:  Mood: Anxious, Depressed  Relating  Eye contact:    Facial expression:    Attitude toward examiner:  Attitude Toward Examiner: Cooperative  Thought and Language  Speech flow: Speech Flow: Normal  Thought content:  Thought Content: Appropriate to mood and circumstances  Preoccupation:  Preoccupations: Ruminations  Hallucinations:  Hallucinations: Other (Comment)(None)  Organization:  logical  [employer] of  Knowledge:  Fund of Knowledge: Average  Intelligence:  Intelligence: Average  Abstraction:  Abstraction: Normal  Judgement:  Judgement: Normal  Reality Testing:  Reality Testing: Realistic   Insight:  Insight: Good  Decision Making:  Decision Making: Only simple  Social Functioning  Social Maturity:  Social Maturity: Isolates  Social Judgement:  Social Judgement: Victimized  Stress  Stressors:  Stressors: Family conflict, Transitions, Work  Coping Ability:  Coping Ability: Building surveyorverwhelmed  Skill Deficits:    Supports:     Family and Psychosocial History: Family history Marital status: Married Number of Years Married: 10 What types of issues is patient dealing with in the relationship?: We get along reallly well but my ability to open up and talk has afffected our relationship and intimacy because I don't want to be around anyone Additional relationship information: Patient and his wife along with their two children reside in [place], [place] Are you sexually active?: Yes Has your sexual activity been affected by drugs, alcohol, medication, or emotional stress?: yes - emotional stress Does patient have children?: Yes How many children?: 2(two sons, ages 709 and 586) How is patient's relationship with their children?: Before this, it was good, but now they see a change in me as well and say to me you don't talk as much , you don't laugh , what is wrong  Childhood History:  Childhood History By whom was/is the patient raised?: Mother(He had periodic contact with his father, parents were not married) Additional childhood history information: Patient was born and raised in [place]. Description of patient's relationship with caregiver when they were a child: [place], mother was a Chief Executive Officerhard worker Patient's description of current relationship with people who raised him/her: still great How were you disciplined when you got in trouble as a child/adolescent?: spankings Does patient have siblings?: Yes(Patient is his mother's only child. He has 4 half siblings by father.) Number of Siblings: 4 Description of patient's current relationship with siblings: don't really have a  relationship with half silblings by father Did patient suffer any verbal/emotional/physical/sexual abuse as a child?: No(Remembers incident in childhood when riding bike and caucasian kids throwing rocks and saying racial slurs) Did patient suffer from severe childhood neglect?: No Has patient ever been sexually abused/assaulted/raped as an adolescent or adult?: No Was the patient ever a victim of a crime or a disaster?: No Witnessed domestic violence?: No Has patient been effected by domestic violence as an adult?: No  CCA Part Two B  Employment/Work Situation: Employment / Work Psychologist, occupationalituation Employment situation: Unemployed What is the longest time patient has a held a job?: 10 years Where was the patient employed at that time?: [employer] Did You Receive Any Psychiatric Treatment/Services While in Equities traderthe Military?: No Are There Guns or Other Weapons in Your Home?: No  Education: Education Did Garment/textile technologistYou Graduate From [employer]?: Yes Did Theme park managerYou Attend College?: No Did You Have Any Scientist, research (life sciences)pecial Interests In School?: sports (basketball) Did You Have An Individualized Education Program (IIEP): No Did You Have Any Difficulty At [employer] EnergySchool?: No  Religion: Religion/Spirituality Are You A Religious Person?: Yes What is Your Religious Affiliation?: Environmental consultantBaptist  Leisure/Recreation: Leisure / Recreation Leisure and Hobbies: nothing now but used  to like play basketball, reading, being very social  Exercise/Diet: Exercise/Diet Do You Exercise?: No Have You Gained or Lost A Significant Amount of Weight in the Past Six Months?: No Do You Follow a Special Diet?: No Do You Have Any Trouble Sleeping?: Yes Explanation of Sleeping Difficulties: difficulty falling and  staying asleep even with use of ambien ( sleeps about 4-5 hours per night)  CCA Part Two C  Alcohol/Drug Use: Alcohol / Drug Use Pain Medications: See patient record Prescriptions: See patient record Over the Counter: See patient record History of  alcohol / drug use?: No history of alcohol / drug abuse  CCA Part Three  ASAM's:  Six Dimensions of Multidimensional Assessment  N/A  Substance use Disorder (SUD)  N/A   Social Function:  Social Functioning Social Maturity: Isolates Social Judgement: Victimized  Stress:  Stress Stressors: Family conflict, Transitions, Work Coping Ability: Overwhelmed Patient Takes Medications The Way The Doctor Instructed?: Yes Priority Risk: Moderate Risk  Risk Assessment- Self-Harm Potential: Risk Assessment For Self-Harm Potential Thoughts of Self-Harm: No current thoughts Method: No plan Availability of Means: No access/NA  Risk Assessment -Dangerous to Others Potential: Risk Assessment For Dangerous to Others Potential Method: No Plan Availability of Means: No access or NA Intent: Vague intent or NA Notification Required: No need or identified person  DSM5 Diagnoses: Patient Active Problem List   Diagnosis Date Noted  . Discolored nails 11/01/2018  . Anxiety 01/29/2018  . Depression, major, single episode, severe (HCC) 01/29/2018  . Vitamin D deficiency 01/29/2018  . Bilateral carpal tunnel syndrome 01/14/2018  . Insomnia 07/11/2017  . Chronic hand pain 07/10/2017  . Overweight (BMI 25.0-29.9) 07/03/2013  . IGT (impaired glucose tolerance) 10/31/2012  . Perennial allergic rhinitis 10/19/2012  . Atopic eczema 10/19/2012    Patient Centered Plan: Patient is on the following Treatment Plan(s): Will be developed at next session   Recommendations for Services/Supports/Treatments: Recommendations for Services/Supports/Treatments Recommendations For Services/Supports/Treatments: Individual Therapy, Medication Management/patient attends the assessment appointment today.  Confidentiality and limits are discussed.  Patient agrees to return for an appointment in 2 weeks.  He also agrees to call this practice, call [phone], or have someone take him to the ER should symptoms worsen.   Individual therapy is recommended 1 time every 1 to 2 weeks to alleviate symptoms of depression and resume normal interest and involvement in activities and social interaction.  Treatment Plan Summary: Will be developed at next session  Referrals to Alternative Service(s): Referred to Alternative Service(s):   Place:   Date:   Time:    Referred to Alternative Service(s):   Place:   Date:   Time:    Referred to Alternative Service(s):   Place:   Date:   Time:    Referred to Alternative Service(s):   Place:   Date:   Time:     Adah Salvageeggy E Quency Tober

## 2018-12-09 NOTE — Progress Notes (Signed)
Virtual Visit via Video Note  I connected with Gabriel [name] on 12/15/18 at  3:50 PM EDT by a video enabled telemedicine application and verified that I am speaking with the correct person using two identifiers.   I discussed the limitations of evaluation and management by telemedicine and the availability of in person appointments. The patient expressed understanding and agreed to proceed.    I discussed the assessment and treatment plan with the patient. The patient was provided an opportunity to ask questions and all were answered. The patient agreed with the plan and demonstrated an understanding of the instructions.   The patient was advised to call back or seek an in-person evaluation if the symptoms worsen or if the condition fails to improve as anticipated.  I provided 25 minutes of non-face-to-face time during this encounter.   Gabriel Hottereina Penney Domanski, MD    [facility] MD/PA/NP OP Progress Note  12/15/2018 4:30 PM Gabriel Gabriel [name]  MRN:  [id number]  Chief Complaint:  Chief Complaint    Depression; Follow-up; Anxiety     HPI:  This is a follow-up appointment for depression.  He states that he has not noticed any change since the last visit.  He wants to isolate himself, and stays inside the room most of the time.  He feels like his family is thinking of him as a failure, and he also believes that he is failing his family. Although he has been applying for job, he has not been able to get the one yet. He agrees that he is doing the best he can do despite struggles. He acknowledges that the current protest/recent incident makes him feel worse; he thinks that these are "real life things" occurred to him. He agrees to have more time with his family while he may also have time for himself.  He has initial and middle insomnia (sleeps 1-3 am).  He has limited benefit from Ambien.  He feels depressed.  He has difficulty in concentration.  He has anhedonia and very low energy.  He denies SI.  He feels  anxious and tense.  He has occasional panic attacks.    Visit Diagnosis:    ICD-10-CM   1. 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 I have reviewed the history. No updates at this time.     Past Medical History:  Past Medical History:  Diagnosis Date  . Carpal tunnel syndrome 2018  . Depression   . Heart murmur   . Seasonal allergies     Past Surgical History:  Procedure Laterality Date  . NO PAST SURGERIES      Family Psychiatric History: Please see initial evaluation for full details. I have reviewed the history. No updates at this time.     Family History:  Family History  Problem Relation Age of Onset  . Diabetes Father   . Hypertension Father   . Hypertension Mother   . Cancer Maternal Grandmother   . Cancer Maternal Grandfather   . Hypertension Maternal Grandfather   . Cancer Paternal Grandmother   . Hypertension Paternal Grandmother   . Cancer Paternal Grandfather   . Hypertension Paternal Grandfather     Social History:  Social History   Socioeconomic History  . Marital status: Married    Spouse name: Not on file  . Number of children: 2  . Years of education: 3112  . Highest education level: High  school graduate  Occupational History  . Not on file  Social Needs  . Financial resource strain: Somewhat hard  . Food insecurity    Worry: Never true    Inability: Never true  . Transportation needs    Medical: No    Non-medical: No  Tobacco Use  . Smoking status: Never Smoker  . Smokeless tobacco: Never Used  Substance and Sexual Activity  . Alcohol use: No    Alcohol/week: 0.0 standard drinks  . Drug use: No  . Sexual activity: Yes    Birth control/protection: None  Lifestyle  . Physical activity    Days per week: Not on file    Minutes per session: Not on file  . Stress: Very much  Relationships  . Social  Musicianconnections    Talks on phone: Not on file    Gets together: Not on file    Attends religious service: Not on file    Active member of club or organization: Not on file    Attends meetings of clubs or organizations: Not on file    Relationship status: Not on file  Other Topics Concern  . Not on file  Social History Narrative   He lives with wife and two children.   He works as Agricultural consultantdistrict manager at [employer]   Highest level of education:  12th grade    Allergies: No Known Allergies  Metabolic Disorder Labs: Lab Results  Component Value Date   HGBA1C 5.9 (H) 12/04/2016   MPG 123 12/04/2016   MPG 131 09/13/2015   No results found for: PROLACTIN Lab Results  Component Value Date   CHOL 156 02/18/2018   TRIG 61 02/18/2018   HDL 33 (L) 02/18/2018   CHOLHDL 4.7 02/18/2018   VLDL 10 12/04/2016   LDLCALC 108 (H) 02/18/2018   LDLCALC 98 12/04/2016   Lab Results  Component Value Date   TSH 2.11 07/16/2017   TSH 1.67 12/04/2016    Therapeutic Level Labs: No results found for: LITHIUM No results found for: VALPROATE No components found for:  CBMZ  Current Medications: Current Outpatient Medications  Medication Sig Dispense Refill  . hydrocortisone 2.5 % cream Apply 1 application topically as directed.    Marland Kitchen. ketoconazole (NIZORAL) 2 % shampoo Apply 1 application topically as directed.    . loratadine (CLARITIN) 10 MG tablet Take 1 tablet (10 mg total) by mouth daily. 90 tablet 3  . mometasone (NASONEX) 50 MCG/ACT nasal spray Place 2 sprays into the nose daily. 34 g 2  . naproxen (NAPROSYN) 500 MG tablet Take 1 tablet (500 mg total) by mouth 2 (two) times daily with a meal. (Patient not taking: Reported on 12/03/2018) 30 tablet 0  . QUEtiapine (SEROQUEL) 25 MG tablet Take 1 tablet (25 mg total) by mouth at bedtime. 30 tablet 1  . tretinoin (RETIN-A) 0.025 % cream Apply 1 application topically at bedtime.    [name]. [START ON 01/09/2019] venlafaxine XR (EFFEXOR-XR) 150 MG 24 hr  capsule 225 mg daily (75 mg +150 mg ) 30 capsule 0  . [START ON 01/09/2019] venlafaxine XR (EFFEXOR-XR) 75 MG 24 hr capsule 225 mg daily (75 mg +150 mg ) 30 capsule 0   No current facility-administered medications for this visit.      Musculoskeletal: Strength & Muscle Tone: N/A Gait & Station: N/A Patient leans: N/A  Psychiatric Specialty Exam: Review of Systems  Psychiatric/Behavioral: Positive for depression and memory loss. Negative for hallucinations, substance abuse and suicidal ideas. The patient  is nervous/anxious and has insomnia.   All other systems reviewed and are negative.   There were no vitals taken for this visit.There is no height or weight on file to calculate BMI.  General Appearance: Fairly Groomed  Eye Contact:  Good  Speech:  Clear and Coherent  Volume:  Normal  Mood:  Anxious and Depressed  Affect:  Appropriate, Congruent and Restricted  Thought Process:  Coherent  Orientation:  Full (Time, Place, and Person)  Thought Content: Logical   Suicidal Thoughts:  No  Homicidal Thoughts:  No  Memory:  Immediate;   Good  Judgement:  Good  Insight:  Good  Psychomotor Activity:  Normal  Concentration:  Concentration: Good and Attention Span: Good  Recall:  Good  Fund of Knowledge: Good  Language: Good  Akathisia:  No  Handed:  Right  AIMS (if indicated): not done  Assets:  Communication Skills Desire for Improvement  ADL's:  Intact  Cognition: WNL  Sleep:  Poor   Screenings: GAD-7     [facility] Visit [facility]/2018 in [facility] Visit [facility] 01/28/2018 [facility] Follow [facility] 12/[facility]2017 [facility] Follow [facility] from 10/24/2017 [facility]  Total GAD-7 Score  20  15  10  15  11     PHQ2-9     [facility] Visit [facility]2018 [facility] Visit [facility]/12/2018 [facility]  [facility] Visit [facility]/2018 [facility]/2018 [facility] Follow [facility]2018 [facility]  PHQ-2 Total Score  3  6  6  6  4   PHQ-9 Total Score  18  14  22  21  11       Assessment Gabriel Gabriel Stahlman is a 43 y.o. year old male with a history of depression, bilateral carpel tunnel syndrome, who presents for follow up appointment for depression.   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  Psychosocial stressors includes unemployment/termination of job, which she perceives as racial discrimination.  He also does have reexperiencing of this experience due to ongoing protest.  Will start quetiapine as adjunctive treatment for depression.  Discussed potential metabolic side effect and drowsiness.  Will continue venlafaxine to target depression.  Discussed potential risk of hypertension and headache.  Will hold Ambien given limited benefit from the medication.  Discussed behavioral activation. Coached self compassion.  Explored his value congruent action he can take in the midst of his struggle. He will continue to see Ms. Bynum for therapy.   Plan 1. Continue venlafaxine 225 mg (150 mg + 75 mg) daily 2. Start quetiapine 25 mg at night  3. Hold Ambien 3. Next appointment: 9/14 at 2 PM for 30 mins, video - he has annual check this month; advised to check TSH  Past trials of medication:sertraline, fluoxetine,mirtazapine, nortriptyline for carpel tunnel syndrome, Trazodone, Ambien (limited benefit)  The patient demonstrates the following risk factors for suicide: Chronic risk factors for suicide include:psychiatric disorder ofdepression. Acute risk factorsfor suicide include: unemployment and loss (financial, interpersonal,  professional). Protective factorsfor this patient include: positive social support, responsibility to others (children, family) and hope for the future. Considering these factors,  the overall suicide risk at this point appears to below. Patientisappropriate for outpatient follow up.  The duration of this appointment visit was 25 minutes of non face-to-face time with the patient.  Greater than 50% of this time was spent in counseling, explanation of  diagnosis, planning of further management, and coordination of care.   [name], MD 12/15/2018, 4:30 PM

## 2018-12-15 ENCOUNTER — Other Ambulatory Visit: Payer: Self-pay

## 2018-12-15 ENCOUNTER — Encounter (HOSPITAL_COMMUNITY): Payer: Self-pay | Admitting: Psychiatry

## 2018-12-15 ENCOUNTER — Ambulatory Visit (INDEPENDENT_AMBULATORY_CARE_PROVIDER_SITE_OTHER): Payer: 59 | Admitting: Psychiatry

## 2018-12-15 DIAGNOSIS — R413 Other amnesia: Secondary | ICD-10-CM | POA: Diagnosis not present

## 2018-12-15 DIAGNOSIS — F419 Anxiety disorder, unspecified: Secondary | ICD-10-CM | POA: Diagnosis not present

## 2018-12-15 DIAGNOSIS — F321 Major depressive disorder, single episode, moderate: Secondary | ICD-10-CM | POA: Diagnosis not present

## 2018-12-15 DIAGNOSIS — G4700 Insomnia, unspecified: Secondary | ICD-10-CM | POA: Diagnosis not present

## 2018-12-15 MED ORDER — QUETIAPINE FUMARATE 25 MG PO TABS: 25.0000 mg | ORAL_TABLET | Freq: Every day | ORAL | 1 refills | Status: DC

## 2018-12-15 MED ORDER — VENLAFAXINE HCL ER 75 MG PO CP24: ORAL_CAPSULE | ORAL | 0 refills | Status: DC

## 2018-12-15 MED ORDER — VENLAFAXINE HCL ER 150 MG PO CP24: ORAL_CAPSULE | ORAL | 0 refills | Status: DC

## 2018-12-15 MED FILL — VENLAFAXINE HCL ER 150 MG C: 150 | 30 days supply | Qty: 30 | Fill #0

## 2018-12-15 MED FILL — QUETIAPINE 25 MG TABLET: 25 | 30 days supply | Qty: 30 | Fill #0

## 2018-12-15 MED FILL — VENLAFAXINE HCL ER 75 MG CA: 75 | 30 days supply | Qty: 30 | Fill #0

## 2018-12-15 NOTE — Patient Instructions (Signed)
1. Continue venlafaxine 225 mg (150 mg + 75 mg) daily 2. Start quetiapine 25 mg at night  3. Hold Ambien 3. Next appointment: 9/14 at 2 PM

## 2018-12-18 ENCOUNTER — Ambulatory Visit (INDEPENDENT_AMBULATORY_CARE_PROVIDER_SITE_OTHER): Payer: 59 | Admitting: Psychiatry

## 2018-12-18 ENCOUNTER — Other Ambulatory Visit: Payer: Self-pay

## 2018-12-18 ENCOUNTER — Telehealth (INDEPENDENT_AMBULATORY_CARE_PROVIDER_SITE_OTHER): Payer: 59 | Admitting: Neurology

## 2018-12-18 ENCOUNTER — Encounter: Payer: Self-pay | Admitting: Neurology

## 2018-12-18 ENCOUNTER — Telehealth (HOSPITAL_COMMUNITY): Payer: Self-pay | Admitting: Psychiatry

## 2018-12-18 ENCOUNTER — Telehealth: Payer: Self-pay

## 2018-12-18 VITALS — Ht 72.0 in | Wt 200.0 lb

## 2018-12-18 DIAGNOSIS — G5603 Carpal tunnel syndrome, bilateral upper limbs: Secondary | ICD-10-CM | POA: Diagnosis not present

## 2018-12-18 DIAGNOSIS — R200 Anesthesia of skin: Secondary | ICD-10-CM

## 2018-12-18 DIAGNOSIS — R253 Fasciculation: Secondary | ICD-10-CM | POA: Diagnosis not present

## 2018-12-18 DIAGNOSIS — F321 Major depressive disorder, single episode, moderate: Secondary | ICD-10-CM

## 2018-12-18 MED FILL — VENLAFAXINE HCL ER 75 MG CA: 75 | 30 days supply | Qty: 30 | Fill #0

## 2018-12-18 MED FILL — QUETIAPINE FUMARATE 25 MG T: 25 | 30 days supply | Qty: 30 | Fill #0

## 2018-12-18 MED FILL — VENLAFAXINE HCL ER 150 MG C: 150 | 30 days supply | Qty: 30 | Fill #0

## 2018-12-18 MED FILL — KETOCONAZOLE 2 % SHAM: 2 | 30 days supply | Qty: 120 | Fill #2

## 2018-12-18 NOTE — Progress Notes (Signed)
Virtual Visit via Video Note The purpose of this virtual visit is to provide medical care while limiting exposure to the novel coronavirus.    Consent was obtained for video visit:  Yes.   Answered questions that patient had about telehealth interaction:  Yes.   I discussed the limitations, risks, security and privacy concerns of performing an evaluation and management service by telemedicine. I also discussed with the patient that there may be a patient responsible charge related to this service. The patient expressed understanding and agreed to proceed.  Pt location: Home Physician Location: office Name of referring provider:  Kerri PerchesSimpson, Margaret E, MD I connected with [name] Settingerrence Dinkel at patients initiation/request on 12/18/2018 at  1:30 PM EDT by video enabled telemedicine application and verified that I am speaking with the correct person using two identifiers. Pt MRN:  [id number] Pt DOB:  12/20/1975 Video Participants:  [name] Settingerrence Boye   History of Present Illness: This is a 43 y.o. male returning for follow-up of bilateral carpal tunnel syndrome and new complaints of right leg numbness and muscle twitching.  He is complaining of worsening numbness/tingling which is worse in the right hand.  He does not find any relief with using wrist splints.  He admits to weakness in the hands and is having difficulty with opening jars/bottles.    He is also complain of right leg numbness/tingling, which is worse at night time.  He complains of weakness in the leg, often having to sit down because he feels like it could give out.  He has not had any falls.  He does not have similar symptoms in the left leg.   He also complains episodic twitching of the arms and right leg.  No exacerbating or alleviating factors.   He endorses have severe depression and is very stressed all the time.  He is seeing a Veterinary surgeoncounselor and psychiatrist.  Observations/Objective:   Vitals:   12/18/18 1110  Weight: 200 lb  (90.7 kg)  Height: 6' (1.829 m)   Patient is awake, alert, and appears comfortable.  Depressed appearing.   Extraocular muscles are intact. No ptosis.  Face is symmetric.  Speech is not dysarthric. Tongue is midline. Antigravity in all extremities.  No pronator drift. Gait appears normal.  Toe walking intact, mild difficulty with heel walking.   Assessment and Plan:  1.  Bilateral carpal tunnel syndrome, moderate on EMG.  Despite being compliant with using wrist splints, he reports worsening hand paresthesias, worse on the right.  I will refer him to Dr. Darrelyn HillockGioffre, who he is seen previously, for further management (ie. steroid injection vs surgical opinion).  2.  Right leg paresthesias and weakness, symptoms do not conform to a dermatomal distribution.  I will first start with physical therapy for leg stretching and low back strengthening.  If no improvement, the next step is NCS/EMG of the right leg.  3.  Generalized muscle twitches are nonspecific, likely contributed by stress.  Reassurance provided.   4.  Severe depression.   I suspect this is contributing to his overall sense of poor well being.  He is followed by psychiatry and a counselor   Follow Up Instructions:   I discussed the assessment and treatment plan with the patient. The patient was provided an opportunity to ask questions and all were answered. The patient agreed with the plan and demonstrated an understanding of the instructions.   The patient was advised to call back or seek an in-person evaluation if the symptoms worsen or  if the condition fails to improve as anticipated.  Total time spent:  25 minutes     Alda Berthold, DO

## 2018-12-18 NOTE — Progress Notes (Signed)
Virtual Visit via Telephone Note  I connected with Gabriel [name] on 12/18/18 at 10:25 EDT by telephone and verified that I am speaking with the correct person using two identifiers.   I discussed the limitations, risks, security and privacy concerns of performing an evaluation and management service by telephone and the availability of in person appointments. I also discussed with the patient that there may be a patient responsible charge related to this service. The patient expressed understanding and agreed to proceed.    I provided 35 minutes of non-face-to-face time during this encounter.   Adah Salvageeggy E Drusilla Wampole, LCSW             THERAPIST PROGRESS NOTE  Session Time:  Friday 12/18/2018 10:25 AM - 11:05 AM   Participation Level: Active  Behavioral Response: AlertDepressed  Type of Therapy: Individual Therapy  Treatment Goals addressed: Establish rapport, alleviate depressive symptoms and resume normal involvement and activity and social activity, learn and implement cognitive and behavioral strategies to overcome depression,   Interventions: CBT and Supportive  Summary: Gabriel [name] is a 43 y.o. male who is referred for services by PCP Dr. Syliva OvermanMargaret Simpson due to patient experiencing symptoms of depression. He denies any psychiatric hospitalizations, He saw therapist Dorann LodgeWes Swan [place] few [place]. Patient reports symptoms began when he lost his job about a year ago. Per his report, he was fired after he complained about race discrimination. 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 He states now feeling as though he failed his family. He states not wanting to be around anyone and having difficulty engaging with his wife and children. He fears losing his family.  He reports ruminating thoughts about job and the past, excessive worry depressed  mood, tearfulness, anxiety, isolative behaviors, poor motivation, poor concentration, irritability, sleep difficulty, thoughts and feelings of hopelessness and worthlessness. He denies any suicidal ideations.  Patient last was seen via virtual visit about two weeks ago. He reports no change in symptoms.  He reports continued difficulty falling and staying asleep.  He states eating 1 meal per day and little to no involvement in activity.  He says he will prepare something to eat for his children when asked.  He continues to isolate and does not answer calls from friends.  He reports minimal social interaction with wife and children and continues to fear he will lose them.  He continues to ruminate about his job.  He continues to report thoughts and feelings of hopelessness and worthlessness but denies any suicidal ideations.   Suicidal/Homicidal: Nowithout intent/plan  Therapist Response: Established rapport, reviewed symptoms, facilitated expression of thoughts and feelings regarding stressors, validated feelings, developed treatment plan, obtained patient's permission to initial plan for patient as this was a virtual visit, discussed patient's value of family, provided psychoeducation regarding depression and the role of self-care, assisted patient develop plan to improve self-care regarding eating patterns including eating breakfast and lunch in addition to eating dinner, assisted patient develop plan to increase physical activity by walking 5 to 10 minutes on Monday Wednesdays and Fridays, assisted patient identify/address thoughts and processes that may inhibit implementation of plan assisted patient develop a list of benefits for implementing plan and a list of consequences for not implementing plan, assigned patient to read daily, obtained patient's commitment to implement plan.  Plan: Return again  in 2 weeks.  Diagnosis: Axis I: MDD, single episode, moderate    Axis II: No diagnosis    [name], LCSW 12/18/2018

## 2018-12-18 NOTE — Telephone Encounter (Signed)
Therapist attempted to contact patient twice via text for scheduled appointment, no response. Therapist called patient and left message indicating attempt to contact for scheduled appointment and requested patient call office.

## 2018-12-18 NOTE — Telephone Encounter (Signed)
Sent referral to [facility] and sent referral to Dr. Emiliano Dyer for bilteral carpel tunnel and faxed notes and EMG

## 2018-12-22 ENCOUNTER — Encounter: Payer: 59 | Admitting: Family Medicine

## 2018-12-29 ENCOUNTER — Encounter: Payer: Self-pay | Admitting: Family Medicine

## 2018-12-29 ENCOUNTER — Other Ambulatory Visit: Payer: Self-pay

## 2018-12-29 ENCOUNTER — Ambulatory Visit (INDEPENDENT_AMBULATORY_CARE_PROVIDER_SITE_OTHER): Payer: 59 | Admitting: Family Medicine

## 2018-12-29 VITALS — BP 112/80 | HR 84 | Temp 97.8°F | Resp 15 | Ht 72.0 in | Wt 210.0 lb

## 2018-12-29 DIAGNOSIS — F322 Major depressive disorder, single episode, severe without psychotic features: Secondary | ICD-10-CM | POA: Diagnosis not present

## 2018-12-29 DIAGNOSIS — R7301 Impaired fasting glucose: Secondary | ICD-10-CM | POA: Diagnosis not present

## 2018-12-29 DIAGNOSIS — R7302 Impaired glucose tolerance (oral): Secondary | ICD-10-CM

## 2018-12-29 DIAGNOSIS — Z13220 Encounter for screening for lipoid disorders: Secondary | ICD-10-CM

## 2018-12-29 DIAGNOSIS — E559 Vitamin D deficiency, unspecified: Secondary | ICD-10-CM | POA: Diagnosis not present

## 2018-12-29 DIAGNOSIS — F419 Anxiety disorder, unspecified: Secondary | ICD-10-CM

## 2018-12-29 DIAGNOSIS — Z0000 Encounter for general adult medical examination without abnormal findings: Secondary | ICD-10-CM

## 2018-12-29 NOTE — Patient Instructions (Addendum)
Follow-up with MD in January call if you need me before.  Please call back for the flu vaccine and get this no need to than October if possible.  Please commit to making changes as we discussed 1  #1.  Plan to clean the house 1 time weekly  #2  Plan to cook dinner for the family 1 time weekly  #3 Plan to walk outside with the  family 1 time weekly  Once you have started doing this regularly and see that you enjoy it  And that ypou are able to do this,  I would like you to increase number of days you to each of the activities.  Always remember you can and will get letter.  As discussed I will send messages to your therapist and psychiatrist.  Labs today CBC TSH CMP and EGFR and lipid panel. And vit D Prostate blood test is due later in the year and we will give you the lab   Thanks for choosing [facility], we consider it a privelige to serve you. order to be done at that time.October 15 or after

## 2018-12-29 NOTE — Progress Notes (Signed)
   Gabriel [name]     MRN: [id number]      DOB: 11-29-1975   HPI: Patient is in for annual physical exam. Ongoing uncontrolled and debilitating depression and anxiety Immunization is reviewed , and  States he will return for flu vaccine    PE; BP 112/80   Pulse 84   Temp 97.8 F (36.6 C) (Temporal)   Resp 15   Ht 6' (1.829 m)   Wt 210 lb (95.3 kg)   SpO2 97%   BMI 28.48 kg/m   Pleasant male, alert and oriented x 3, in no cardio-pulmonary distress.exttremely anxious and depressed Afebrile. HEENT No facial trauma or asymetry. Sinuses non tender. EOMI,  External ears normal, Neck: supple, no adenopathy,JVD or thyromegaly.No bruits.  Chest: Clear to ascultation bilaterally.No crackles or wheezes. Non tender to palpation  Cardiovascular system; Heart sounds normal,  S1 and  S2 ,no S3.  No murmur, or thrill. Apical beat not displaced Peripheral pulses normal.  Abdomen: Soft, non tender, No guarding, tenderness or rebound.    Musculoskeletal exam: Full ROM of spine, hips , shoulders and knees. No deformity ,swelling or crepitus noted. No muscle wasting or atrophy.   Neurologic: Cranial nerves 2 to 12 intact. Power, tone ,sensation and reflexes normal throughout. No disturbance in gait. No tremor.  Skin: Intact, no ulceration, erythema , scaling or rash noted. Pigmentation normal throughout  Psych; Anxious and depressed.Marland Kitchen Poor Judgement and concentration    Assessment & Plan:  Annual physical exam Annual exam as documented. Counseling done  re healthy lifestyle involving commitment to 150 minutes exercise per week, heart healthy diet, and attaining healthy weight.The importance of adequate sleep also discussed. Regular seat belt use and home safety, is also discussed. Changes in health habits are decided on by the patient with goals and time frames  set for achieving them. Immunization and cancer screening needs are specifically addressed at this visit.    Depression, major, single episode, severe (HCC) Unchanged despite intensive mental health involvement , not suicidal or homicidal. States he is benefiting from therapy slightly, will message Psychiatry  Anxiety Uncontrolled , will message Psych

## 2018-12-29 NOTE — Assessment & Plan Note (Signed)

## 2018-12-30 ENCOUNTER — Encounter: Payer: Self-pay | Admitting: Family Medicine

## 2018-12-30 ENCOUNTER — Telehealth: Payer: Self-pay

## 2018-12-30 DIAGNOSIS — Z13220 Encounter for screening for lipoid disorders: Secondary | ICD-10-CM

## 2018-12-30 DIAGNOSIS — R7302 Impaired glucose tolerance (oral): Secondary | ICD-10-CM

## 2018-12-30 DIAGNOSIS — E663 Overweight: Secondary | ICD-10-CM

## 2018-12-30 LAB — CBC
HCT: 44 % (ref 38.5–50.0)
Hemoglobin: 14.9 g/dL (ref 13.2–17.1)
MCH: 29.2 pg (ref 27.0–33.0)
MCHC: 33.9 g/dL (ref 32.0–36.0)
MCV: 86.3 fL (ref 80.0–100.0)
MPV: 9 fL (ref 7.5–12.5)
Platelets: 362 10*3/uL (ref 140–400)
RBC: 5.1 10*6/uL (ref 4.20–5.80)
RDW: 11.9 % (ref 11.0–15.0)
WBC: 5 10*3/uL (ref 3.8–10.8)

## 2018-12-30 LAB — COMPLETE METABOLIC PANEL WITH GFR
AG Ratio: 1.7 (calc) (ref 1.0–2.5)
ALT: 59 U/L — ABNORMAL HIGH (ref 9–46)
AST: 28 U/L (ref 10–40)
Albumin: 4.6 g/dL (ref 3.6–5.1)
Alkaline phosphatase (APISO): 88 U/L (ref 36–130)
BUN: 16 mg/dL (ref 7–25)
CO2: 29 mmol/L (ref 20–32)
Calcium: 9.8 mg/dL (ref 8.6–10.3)
Chloride: 102 mmol/L (ref 98–110)
Creat: 1.19 mg/dL (ref 0.60–1.35)
GFR, Est African American: 86 mL/min/{1.73_m2} (ref >=60)
GFR, Est Non African American: 74 mL/min/{1.73_m2} (ref >=60)
Globulin: 2.7 g/dL (calc) (ref 1.9–3.7)
Glucose, Bld: 97 mg/dL (ref 65–99)
Potassium: 4.4 mmol/L (ref 3.5–5.3)
Sodium: 139 mmol/L (ref 135–146)
Total Bilirubin: 0.5 mg/dL (ref 0.2–1.2)
Total Protein: 7.3 g/dL (ref 6.1–8.1)

## 2018-12-30 LAB — LIPID PANEL
Cholesterol: 183 mg/dL (ref <200)
HDL: 39 mg/dL — ABNORMAL LOW (ref >=40)
LDL Cholesterol (Calc): 128 mg/dL (calc) — ABNORMAL HIGH
Non-HDL Cholesterol (Calc): 144 mg/dL (calc) — ABNORMAL HIGH (ref <130)
Total CHOL/HDL Ratio: 4.7 (calc) (ref <5.0)
Triglycerides: 72 mg/dL (ref <150)

## 2018-12-30 LAB — HEMOGLOBIN A1C
Hgb A1c MFr Bld: 6.4 % of total Hgb — ABNORMAL HIGH (ref <5.7)
Mean Plasma Glucose: 137 (calc)
eAG (mmol/L): 7.6 (calc)

## 2018-12-30 LAB — VITAMIN D 25 HYDROXY (VIT D DEFICIENCY, FRACTURES): Vit D, 25-Hydroxy: 39 ng/mL (ref 30–100)

## 2018-12-30 LAB — TSH: TSH: 2.14 mIU/L (ref 0.40–4.50)

## 2018-12-30 NOTE — Telephone Encounter (Signed)
Fasting labs ordered to be drawn 3 days prior to Jan appt

## 2019-01-01 ENCOUNTER — Telehealth (HOSPITAL_COMMUNITY): Payer: Self-pay | Admitting: Psychiatry

## 2019-01-01 ENCOUNTER — Ambulatory Visit (HOSPITAL_COMMUNITY): Payer: 59 | Admitting: Psychiatry

## 2019-01-01 ENCOUNTER — Other Ambulatory Visit: Payer: Self-pay

## 2019-01-01 NOTE — Telephone Encounter (Signed)
Therapist attempted to contact patient via text for scheduled appointment, no response. Therapist called patient and left message indicating attempt and requesting patient call practice.

## 2019-01-02 ENCOUNTER — Encounter: Payer: Self-pay | Admitting: Family Medicine

## 2019-01-02 NOTE — Assessment & Plan Note (Signed)
Uncontrolled , will message Psych

## 2019-01-02 NOTE — Assessment & Plan Note (Signed)
Unchanged despite intensive mental health involvement , not suicidal or homicidal. States he is benefiting from therapy slightly, will message Psychiatry

## 2019-01-07 ENCOUNTER — Telehealth (HOSPITAL_COMMUNITY): Payer: Self-pay

## 2019-01-07 ENCOUNTER — Ambulatory Visit (HOSPITAL_COMMUNITY): Payer: 59 | Admitting: Physical Therapy

## 2019-01-07 NOTE — Telephone Encounter (Signed)
pt stated that he needs a ride

## 2019-01-09 DIAGNOSIS — G8321 Monoplegia of upper limb affecting right dominant side: Secondary | ICD-10-CM | POA: Diagnosis not present

## 2019-01-09 DIAGNOSIS — G8324 Monoplegia of upper limb affecting left nondominant side: Secondary | ICD-10-CM | POA: Diagnosis not present

## 2019-01-09 DIAGNOSIS — M542 Cervicalgia: Secondary | ICD-10-CM | POA: Diagnosis not present

## 2019-01-11 ENCOUNTER — Ambulatory Visit (HOSPITAL_COMMUNITY): Payer: 59

## 2019-01-13 ENCOUNTER — Encounter (HOSPITAL_COMMUNITY): Payer: 59

## 2019-01-19 ENCOUNTER — Encounter (HOSPITAL_COMMUNITY): Payer: 59

## 2019-01-21 ENCOUNTER — Encounter (HOSPITAL_COMMUNITY): Payer: 59

## 2019-01-21 NOTE — Progress Notes (Addendum)
Virtual Visit via Video Note  I connected with Gabriel [name] on 01/25/19 at  2:00 PM EDT by a video enabled telemedicine application and verified that I am speaking with the correct person using two identifiers.   I discussed the limitations of evaluation and management by telemedicine and the availability of in person appointments. The patient expressed understanding and agreed to proceed.   I discussed the assessment and treatment plan with the patient. The patient was provided an opportunity to ask questions and all were answered. The patient agreed with the plan and demonstrated an understanding of the instructions.   The patient was advised to call back or seek an in-person evaluation if the symptoms worsen or if the condition fails to improve as anticipated.  I provided 25 minutes of non-face-to-face time during this encounter.   Neysa Hottereina Charidy Cappelletti, MD     [facility] MD/PA/NP OP Progress Note  01/25/2019 2:31 PM Gabriel [name]  MRN:  [id number]  Chief Complaint:  Chief Complaint    Depression; Follow-up     HPI:  This is a follow-up appointment for depression.  He states that he has been sitting around and do nothing during the day.  He feels anxious with racing thoughts "about life." He is frustrated that he cannot take care of things, such as providing for home. 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 He thinks that "it's my fault." He tends to be by himself in the room. He agrees to explore the way he can support his family while working on getting a job. He has initial and middle insomnia.  He feels fatigue and has anhedonia.  Although he used to enjoy taking a walk, he has not done it lately.  He agrees to try taking a walk with his family.  He has difficulty in concentration.  He has decreased appetite; eat 2 meals per day.  He does not measure his weight.  He denies SI.  He feels anxious sometimes.  He feels mildly irritable.  He denies panic attacks except  that he has palpitation with intense anxiety at times.    Visit Diagnosis:    ICD-10-CM   1. 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 I have reviewed the history. No updates at this time.     Past Medical History:  Past Medical History:  Diagnosis Date  . Carpal tunnel syndrome 2018  . Depression   . Heart murmur   . Seasonal allergies     Past Surgical History:  Procedure Laterality Date  . NO PAST SURGERIES      Family Psychiatric History: Please see initial evaluation for full details. I have reviewed the history. No updates at this time.     Family History:  Family History  Problem Relation Age of Onset  . Diabetes Father   . Hypertension Father   . Hypertension Mother   . Cancer Maternal Grandmother   . Cancer Maternal Grandfather   . Hypertension Maternal Grandfather   . Cancer Paternal Grandmother   . Hypertension Paternal Grandmother   . Cancer Paternal Grandfather   . Hypertension Paternal Grandfather     Social History:  Social History   Socioeconomic History  . Marital status: Married    Spouse name: Not on file  . Number of children: 2  . Years of education: 1012  . Highest education level:  High school graduate  Occupational History  . Not on file  Social Needs  . Financial resource strain: Somewhat hard  . Food insecurity    Worry: Never true    Inability: Never true  . Transportation needs    Medical: No    Non-medical: No  Tobacco Use  . Smoking status: Never Smoker  . Smokeless tobacco: Never Used  Substance and Sexual Activity  . Alcohol use: No    Alcohol/week: 0.0 standard drinks  . Drug use: No  . Sexual activity: Yes    Birth control/protection: None  Lifestyle  . Physical activity    Days per week: Not on file    Minutes per session: Not on file  . Stress: Very much  Relationships  . Social  Musicianconnections    Talks on phone: Not on file    Gets together: Not on file    Attends religious service: Not on file    Active member of club or organization: Not on file    Attends meetings of clubs or organizations: Not on file    Relationship status: Not on file  Other Topics Concern  . Not on file  Social History Narrative   He lives with wife and two children.   Unemployed at this present time   Right handed   One story home   Highest level of education:  12th grade    Allergies: No Known Allergies  Metabolic Disorder Labs: Lab Results  Component Value Date   HGBA1C 6.4 (H) 12/29/2018   MPG 137 12/29/2018   MPG 123 12/04/2016   No results found for: PROLACTIN Lab Results  Component Value Date   CHOL 183 12/29/2018   TRIG 72 12/29/2018   HDL 39 (L) 12/29/2018   CHOLHDL 4.7 12/29/2018   VLDL 10 12/04/2016   LDLCALC 128 (H) 12/29/2018   LDLCALC 108 (H) 02/18/2018   Lab Results  Component Value Date   TSH 2.14 12/29/2018   TSH 2.11 07/16/2017    Therapeutic Level Labs: No results found for: LITHIUM No results found for: VALPROATE No components found for:  CBMZ  Current Medications: Current Outpatient Medications  Medication Sig Dispense Refill  . hydrocortisone 2.5 % cream Apply 1 application topically as directed.    . loratadine (CLARITIN) 10 MG tablet Take 1 tablet (10 mg total) by mouth daily. 90 tablet 3  . mometasone (NASONEX) 50 MCG/ACT nasal spray Place 2 sprays into the nose daily. 34 g 2  . QUEtiapine (SEROQUEL) 50 MG tablet Take 1 tablet (50 mg total) by mouth at bedtime. 30 tablet 1  . tretinoin (RETIN-A) 0.025 % cream Apply 1 application topically at bedtime.    Marland Kitchen. venlafaxine XR (EFFEXOR-XR) 150 MG 24 hr capsule 225 mg daily (75 mg +150 mg ) 90 capsule 0  . venlafaxine XR (EFFEXOR-XR) 75 MG 24 hr capsule 225 mg daily (75 mg +150 mg ) 90 capsule 0  . zolpidem (AMBIEN) 10 MG tablet Take 1 tablet (10 mg total) by mouth at bedtime as needed for sleep.  30 tablet 1  . zolpidem (AMBIEN) 5 MG tablet Take 5 mg by mouth at bedtime as needed for sleep.     No current facility-administered medications for this visit.      Musculoskeletal: Strength & Muscle Tone: N/A Gait & Station: N/A Patient leans: N/A  Psychiatric Specialty Exam: Review of Systems  Psychiatric/Behavioral: Positive for depression. Negative for hallucinations, memory loss, substance abuse and suicidal ideas. The patient is  nervous/anxious and has insomnia.   All other systems reviewed and are negative.   There were no vitals taken for this visit.There is no height or weight on file to calculate BMI.  General Appearance: Fairly Groomed  Eye Contact:  Good  Speech:  Clear and Coherent  Volume:  Normal  Mood:  Anxious and Depressed  Affect:  Appropriate, Congruent and Restricted  Thought Process:  Coherent  Orientation:  Full (Time, Place, and Person)  Thought Content: Logical   Suicidal Thoughts:  No  Homicidal Thoughts:  No  Memory:  Immediate;   Good  Judgement:  Good  Insight:  Fair  Psychomotor Activity:  Normal  Concentration:  Concentration: Fair and Attention Span: Fair  Recall:  Good  Fund of Knowledge: Good  Language: Good  Akathisia:  No  Handed:  Right  AIMS (if indicated): not done  Assets:  Communication Skills Desire for Improvement  ADL's:  Intact  Cognition: WNL  Sleep:  Poor   Screenings: GAD-7     Office Visit from 12/29/2018 in [facility] Office Visit from 10/28/2018 in [facility] Office Visit from 01/28/2018 in [facility] Virtual [facility] Phone Follow Up from 01/08/2018 in [facility] Phone Follow Up from 12/04/2017 in [facility]  Total GAD-7 Score  21  20  15  10  15     PHQ2-9     Office Visit from 12/29/2018 in [facility] Office Visit from 10/28/2018 in [facility] Visit from 08/12/2018 in [facility] Office Visit from 03/17/2018  in [facility] Office Visit from 01/28/2018 in [facility]  PHQ-2 Total Score  6  3  6  6  6   PHQ-9 Total Score  21  18  14  22  21        Assessment and Plan:  Gabriel [name] is a 43 y.o. year old male with a history of depression,bilateral carpel tunnel syndrome , who presents for follow up appointment for Current moderate episode of major depressive disorder without prior episode ([place])  # MDD, moderate, single without psychotic features Although he continues to have depressive symptoms with prominent fatigue/anhedonia and anxiety, exam is notable for less impaired attention compared to the previous visit.  Psychosocial stressors includes unemployment, and termination of job, which he perceives as racial discrimination.  will uptitrate quetiapine as adjunctive treatment for depression, and also to target appetite loss and insomnia.  Discussed potential metabolic side effect and drowsiness.  Will continue venlafaxine to target depression.  Discussed risk of hypertension.  Will uptitrate Ambien for insomnia.  Discussed risk of drowsiness.  He is advised to start this medication after he tries a higher dose of quetiapine to avoid excessive drowsiness.  Coached self compassion and behavioral activation.  He will greatly benefit from [facility]; will make referral.   Plan 1.Continuevenlafaxine 225mg  (150 mg + 75 mg)daily 2. Increase quetiapine 50 mg at night  3. Increase Ambien 10 mg at night as needed for sleep 3. Next appointment: 10/22 at 1:30 for 30 mins, video - Referral to [facility] desk to contact for therapy follow up - Informed the patient that this examiner will be on leave starting around mid-November for a few months, and the patient will be seen by a covering provider if necessary during that time.     Past trials of medication:sertraline, fluoxetine,mirtazapine, nortriptyline for carpel tunnel syndrome, Trazodone, Ambien (limited benefit)  The patient  demonstrates the  following risk factors for suicide: Chronic risk factors for suicide include:psychiatric disorder ofdepression. Acute risk factorsfor suicide include: unemployment and loss (financial, interpersonal, professional). Protective factorsfor this patient include: positive social support, responsibility to others (children, family) and hope for the future. Considering these factors, the overall suicide risk at this point appears to below. Patientisappropriate for outpatient follow up.  The duration of this appointment visit was 25 minutes of non face-to-face time with the patient.  Greater than 50% of this time was spent in counseling, explanation of  diagnosis, planning of further management, and coordination of care.  [name], MD 01/25/2019, 2:31 PM

## 2019-01-22 ENCOUNTER — Other Ambulatory Visit: Payer: Self-pay

## 2019-01-22 ENCOUNTER — Encounter (HOSPITAL_COMMUNITY): Payer: Self-pay

## 2019-01-22 ENCOUNTER — Ambulatory Visit (HOSPITAL_COMMUNITY): Payer: 59 | Attending: Family Medicine

## 2019-01-22 DIAGNOSIS — M6281 Muscle weakness (generalized): Secondary | ICD-10-CM | POA: Insufficient documentation

## 2019-01-22 DIAGNOSIS — R29818 Other symptoms and signs involving the nervous system: Secondary | ICD-10-CM | POA: Insufficient documentation

## 2019-01-22 NOTE — Therapy (Signed)
[facility] [address] [place], [place], [address] Phone: [phone]   Fax:  [phone]  Physical Therapy Evaluation  Patient Details  Name: Gabriel Settingerrence [name] MRN: [id number] Date of Birth: 01/29/1976 Referring Provider (PT): [place]endale ChardPatel, Donika K, DO   Encounter Date: 01/22/2019  PT End of Session - 01/22/19 1533    Visit Number  1    Number of Visits  12    Date for PT Re-Evaluation  03/05/19    Authorization Type  [facility] ( $20 copay, no auth required, med review after 25th visit)    Authorization Time Period  01/22/2019-03/05/2019    Authorization - Visit Number  1    Authorization - Number of Visits  25   for med review   PT Start Time  1534    PT Stop Time  1625    PT Time Calculation (min)  51 min    Activity Tolerance  Patient tolerated treatment well    Behavior During Therapy  [facility] for tasks assessed/performed       Past Medical History:  Diagnosis Date  . Carpal tunnel syndrome 2018  . Depression   . Heart murmur   . Seasonal allergies     Past Surgical History:  Procedure Laterality Date  . NO PAST SURGERIES      There were no vitals filed for this visit.   Subjective Assessment - 01/22/19 1539    Subjective  Patient reports a 1.5-year history of Rt LE weakness and paresthesia that occurs intermittently. He states it has become more pronounced over the last few months and the numbness/tingling in his Rt LE is most severe at night when he lays down. He states he has sleeping difficulties and the leg pain/numbness keeps him up in addition to his insomnia. He states the tingling mostly travels along his lateral and anterior Rt leg and into his entire foot. He states standing for 30-45 minutes typically increases his leg numbness and that sitting sometimes helps relieve his symptoms. He cannot sit still for long though as moving his Rt LE or massaging it seems to help with the symptoms. On most occasions when it  becomes aggravated he states it will resolve after 15-20 minutes of rest.    Pertinent History  --    Limitations  Standing;Walking    How long can you sit comfortably?  sitting helps sometimes - pt reports it takes pressure off something and helps, however he can't stay still too long    How long can you stand comfortably?  30 minutes - 45 minutes maybe before it gets worse    How long can you walk comfortably?  supposed to be walking every day but has fallen off of this plan due to Rt LE weakness and depression    Currently in Pain?  No/denies         [facility] PT Assessment - 01/22/19 0001      Assessment   Medical Diagnosis  Rt LE Numbness    Referring Provider (PT)  Nita SicklePatel, Donika K, DO    Onset Date/Surgical Date  --   approx 1 1/2 years ago   Hand Dominance  Right    Prior Therapy  yes for carpel tunnel      Precautions   Precautions  None      Restrictions   Weight Bearing Restrictions  No      Balance Screen   Has the patient fallen in the past 6  months  No    Has the patient had a decrease in activity level because of a fear of falling?   No    Is the patient reluctant to leave their home because of a fear of falling?   No      Home Environment   Living Environment  Private residence    Living Arrangements  Spouse/significant other;Children   married 10 yrs, 39 y.o and 79 y.o. boys   Available Help at Discharge  Family      Prior Function   Level of Independence  Independent    Vocation  Unemployed    Vocation Requirements  Pt worked at [employer] and stopped working April 2019    Leisure  Pt used to play basketball and still palsy with his kids some. He has been told by his doctors that he needs to walk more to improve his health.      Cognition   Overall Cognitive Status  Within Functional Limits for tasks assessed      Observation/Other Assessments   Focus on Therapeutic Outcomes (FOTO)   NA      Sensation   Light Touch  Appears Intact      Tone   Assessment  Location  Right Lower Extremity;Left Lower Extremity      Deep Tendon Reflexes   DTR Assessment Site  Patella;Achilles;Triceps    Triceps DTR  1+    Patella DTR  1+    Achilles DTR  1+      ROM / Strength   AROM / PROM / Strength  Strength      Strength   Strength Assessment Site  Hip;Knee;Ankle    Right Hip Flexion  3-/5    Right Hip Extension  5/5    Right Hip ABduction  4/5    Left Hip Flexion  4+/5    Left Hip Extension  5/5    Left Hip ABduction  5/5    Right Knee Flexion  4/5    Right Knee Extension  4-/5    Left Knee Flexion  5/5   fatiguable   Left Knee Extension  5/5    Right Ankle Dorsiflexion  4-/5    Right Ankle Plantar Flexion  4+/5   pt stopping some pain increase in Rt ant thigh   Left Ankle Dorsiflexion  5/5    Left Ankle Plantar Flexion  5/5      Palpation   Spinal mobility  normal mobility noted along L1-5 and patient denied symptom provocation with PA's, pt reported it felt good at his back but did not chagne any tingling in the LE      Special Tests    Special Tests  Lumbar    Lumbar Tests  Prone Knee Bend Test;Straight Leg Raise      Prone Knee Bend Test   Findings  Positive    Side  Right    Comment  Pt with increase in tingling/"pins & needles" in Rt thigh and foot      Straight Leg Raise   Findings  Negative    Side   Right    Comment  pt reported tingling/numbness but was guarding and did not have any change in tingling/numbness      Ambulation/Gait   Ambulation/Gait  Yes    Ambulation/Gait Assistance  7: Independent    Assistive device  None    Gait Pattern  Right hip hike;Decreased arm swing - right;Decreased arm swing - left    Ambulation  Surface  Level;Indoor    Gait Comments  pt with stiff gait pattern (suspect pt with guarding due to stress and difficulty relaxing) pt with noted hip hike on Rt LE to increase foot clearance      Balance   Balance Assessed  Yes      Static Standing Balance   Static Standing - Balance Support  No  upper extremity supported    Static Standing - Level of Assistance  7: Independent    Static Standing Balance -  Activities   Single Leg Stance - Right Leg;Single Leg Stance - Left Leg    Static Standing - Comment/# of Minutes  Rt LE (EO) = 9" Lt LE (EO) = 37"; Rt LE (EO) = 2" Lt LE (EO) = 9"      RLE Tone   RLE Tone  Other (comment)   Clonus: positive (4+ beats)     LLE Tone   LLE Tone  Other (comment)   Clonus: negative (3 or less beats)       Objective measurements completed on examination: See above findings.     PT Education - 01/22/19 1647    Education Details  Educated on exam findings and suspected source of numbness and waekness. Educated on appropriate plan to treat and that he may require NCV testing at later date if there is not improvement. Educated on exercises for strengthening to target weak muscle groups. Edcuated on initial HEP to target weak muscle groups.    Person(s) Educated  Patient    Methods  Explanation;Handout    Comprehension  Verbalized understanding       PT Short Term Goals - 01/22/19 1710      PT SHORT TERM GOAL #1   Title  Patient will be independent with HEP, updated PRN, to improve Rt LE strength for improved functional gait/mobility.    Time  2    Period  Weeks    Status  New    Target Date  02/05/19      PT SHORT TERM GOAL #2   Title  Patient will improve Rt LE strength for limited muscle groups by 1/2 grade with MMT to indicate improvement in muscle fiber activation.    Time  3    Period  Weeks    Status  New    Target Date  02/12/19        PT Long Term Goals - 01/22/19 1717      PT LONG TERM GOAL #1   Title  Patient will no longer have symptoms radiating beyond knee to demonstrate reduced neurologic symptoms.    Time  6    Period  Weeks    Status  New    Target Date  03/05/19      PT LONG TERM GOAL #2   Title  Patient will improve Rt LE strength by 1 grade with MMT to improve functional gait and mobility.    Time  6     Period  Weeks    Status  New      PT LONG TERM GOAL #3   Title  Patient will report 50% reduction in Rt LE parasthesisa at night to improve ability to sleep for better QOL.    Time  6    Period  Weeks    Status  New        Plan - 01/22/19 1704    Clinical Impression Statement  Gabriel [name] is a 43 y.o. male presenting to physical  therapy for evaluation of Rt LE numbness and weakness. He denies pain in his back or hip and reports the numbness and tingling has been present for ~ 1.5 years intermittently. Objective testing reveals significant weakness of L2-4 muscle groups via MMT, hyporeflexia throughout bil LE and UE's, possible (+) clonus on Rt LE, however suspect it may be abnormal due to pt anxiety, and (+) neurologic tension for femoral nerve on Rt LE. He does not appear to have sensation los and paraesthesia doe snot follow a clear dermatomal pattern. Patient also has good lumbar mobility with no symptom provocation with central/unilateral PA's to lumbar spine. He was educated on exam findings and possible source of symptoms being related to femoral nerve impingement. Next session will continue with assessment of possible impingement locations and progress treatment for impairments. He will benefit from skilled PT interventions to address functional impairments at this time.    Personal Factors and Comorbidities  Time since onset of injury/illness/exacerbation    Examination-Activity Limitations  Stand;Squat;Locomotion Level;Sleep    Stability/Clinical Decision Making  Evolving/Moderate complexity    Clinical Decision Making  Moderate    Rehab Potential  Good    PT Frequency  2x / week    PT Duration  6 weeks    PT Treatment/Interventions  ADLs/Self Care Home Management;Electrical Stimulation;Cryotherapy;Moist Heat;Traction;DME Instruction;Gait training;Stair training;Functional mobility training;Therapeutic activities;Therapeutic exercise;Balance training;Neuromuscular  re-education;Patient/family education;Manual techniques;Dry needling;Passive range of motion;Spinal Manipulations;Joint Manipulations;Taping    PT Next Visit Plan  Revew goals and HEP. Assess psoas for femoral n. Impingement and assess hip mobility. Perform manual femoral nerve glides, start with segmental glides and progress when symptoms decrease. Perform Rt LE strengthening for muscles innervated by femoral nerve and L2-4 nerve roots (hip flexors, knee extensors, dorsiflexors).    PT Home Exercise Plan  Eval: LAQ, SLR;    Consulted and Agree with Plan of Care  Patient       Patient will benefit from skilled therapeutic intervention in order to improve the following deficits and impairments:  Abnormal gait, Decreased activity tolerance, Decreased balance, Decreased strength, Difficulty walking, Impaired sensation  Visit Diagnosis: Other symptoms and signs involving the nervous system  Muscle weakness (generalized)     Problem List Patient Active Problem List   Diagnosis Date Noted  . Discolored nails 11/01/2018  . Anxiety 01/29/2018  . Depression, major, single episode, severe ([place]) 01/29/2018  . Vitamin D deficiency 01/29/2018  . Bilateral carpal tunnel syndrome 01/14/2018  . Insomnia 07/11/2017  . Chronic hand pain 07/10/2017  . Annual physical exam 02/21/2016  . Overweight (BMI 25.0-29.9) 07/03/2013  . IGT (impaired glucose tolerance) 10/31/2012  . Perennial allergic rhinitis 10/19/2012  . Atopic eczema 10/19/2012    Kipp Brood, PT, DPT, [facility] Physical Therapist with [facility]  01/22/2019 5:54 PM    [facility] [address] [place], [place], [address] Phone: [phone]   Fax:  [phone]  Name: Gabriel [name] MRN: [id number] Date of Birth: 12/24/1975

## 2019-01-25 ENCOUNTER — Other Ambulatory Visit: Payer: Self-pay

## 2019-01-25 ENCOUNTER — Encounter (HOSPITAL_COMMUNITY): Payer: Self-pay | Admitting: Psychiatry

## 2019-01-25 ENCOUNTER — Ambulatory Visit (INDEPENDENT_AMBULATORY_CARE_PROVIDER_SITE_OTHER): Payer: 59 | Admitting: Psychiatry

## 2019-01-25 ENCOUNTER — Telehealth (HOSPITAL_COMMUNITY): Payer: Self-pay

## 2019-01-25 DIAGNOSIS — F321 Major depressive disorder, single episode, moderate: Secondary | ICD-10-CM

## 2019-01-25 MED ORDER — ZOLPIDEM TARTRATE 10 MG PO TABS: 10.0000 mg | ORAL_TABLET | Freq: Every evening | ORAL | 1 refills | Status: DC | PRN

## 2019-01-25 MED ORDER — VENLAFAXINE HCL ER 75 MG PO CP24: ORAL_CAPSULE | ORAL | 0 refills | Status: DC

## 2019-01-25 MED ORDER — QUETIAPINE FUMARATE 50 MG PO TABS: 50.0000 mg | ORAL_TABLET | Freq: Every day | ORAL | 1 refills | Status: DC

## 2019-01-25 MED ORDER — VENLAFAXINE HCL ER 150 MG PO CP24: ORAL_CAPSULE | ORAL | 0 refills | Status: DC

## 2019-01-25 MED FILL — QUETIAPINE FUMARATE 50 MG T: 50 | 30 days supply | Qty: 30 | Fill #0

## 2019-01-25 MED FILL — VENLAFAXINE HCL ER 150 MG C: 150 | 90 days supply | Qty: 90 | Fill #0

## 2019-01-25 MED FILL — VENLAFAXINE HCL ER 75 MG CA: 75 | 90 days supply | Qty: 90 | Fill #0

## 2019-01-25 MED FILL — ZOLPIDEM TARTRATE 10 MG TAB: 10 | 30 days supply | Qty: 30 | Fill #0

## 2019-01-25 NOTE — Telephone Encounter (Signed)
Called the pt to let him know that we had to cancel his 02/12/2019 appt due to the therapist will be on vacation. Pt is aware and he did not wish to reschedule he stated he will come to his next availble appt after that.

## 2019-01-25 NOTE — Patient Instructions (Addendum)
1.Continuevenlafaxine 225mg  (150 mg + 75 mg)daily 2. Increase quetiapine 50 mg at night  3. Increase Ambien 10 mg at night as needed for sleep 4. Next appointment: 10/22 at 1:30

## 2019-01-26 ENCOUNTER — Ambulatory Visit (HOSPITAL_COMMUNITY): Payer: 59

## 2019-01-26 ENCOUNTER — Telehealth (HOSPITAL_COMMUNITY): Payer: Self-pay | Admitting: Professional

## 2019-01-27 ENCOUNTER — Telehealth (HOSPITAL_COMMUNITY): Payer: Self-pay | Admitting: Family Medicine

## 2019-01-27 ENCOUNTER — Ambulatory Visit (HOSPITAL_COMMUNITY): Payer: 59

## 2019-01-27 NOTE — Telephone Encounter (Signed)
01/27/19  Pt called to cx but no reason was given

## 2019-01-28 ENCOUNTER — Encounter (HOSPITAL_COMMUNITY): Payer: 59

## 2019-01-29 ENCOUNTER — Other Ambulatory Visit: Payer: Self-pay

## 2019-01-29 ENCOUNTER — Ambulatory Visit (HOSPITAL_COMMUNITY): Payer: 59

## 2019-01-29 ENCOUNTER — Encounter (HOSPITAL_COMMUNITY): Payer: Self-pay

## 2019-01-29 DIAGNOSIS — M6281 Muscle weakness (generalized): Secondary | ICD-10-CM

## 2019-01-29 DIAGNOSIS — R29818 Other symptoms and signs involving the nervous system: Secondary | ICD-10-CM

## 2019-01-29 NOTE — Therapy (Signed)
[facility] [address] [place], [place], [address] Phone: [phone]   Fax:  [phone]  Physical Therapy Treatment  Patient Details  Name: Ki Akerman MRN: [id number] Date of Birth: [birth year] Referring Provider (PT): Glendale Chard, DO   Encounter Date: 01/29/2019  PT End of Session - 01/29/19 1625    Visit Number  2    Number of Visits  12    Date for PT Re-Evaluation  03/05/19    Authorization Type  [employer] ( $20 copay, no auth required, med review after 25th visit)    Authorization Time Period  01/22/2019-03/05/2019    Authorization - Visit Number  2    Authorization - Number of Visits  25   for Med review   PT Start Time  1618    PT Stop Time  1656    PT Time Calculation (min)  38 min    Activity Tolerance  Patient limited by pain;Patient tolerated treatment well    Behavior During Therapy  [facility] for tasks assessed/performed       Past Medical History:  Diagnosis Date  . Carpal tunnel syndrome 2018  . Depression   . Heart murmur   . Seasonal allergies     Past Surgical History:  Procedure Laterality Date  . NO PAST SURGERIES      There were no vitals filed for this visit.  Subjective Assessment - 01/29/19 1618    Subjective  Pt stated he continues to have pain and numbness down Rt LE, pain scale 5/10 intermittent.  Reports symptoms are the same while standing but increase as laying down.    Currently in Pain?  Yes    Pain Score  5     Pain Location  Leg    Pain Orientation  Right;Lateral    Pain Descriptors / Indicators  Numbness    Pain Type  Chronic pain    Pain Radiating Towards  Rt LE lateral tingling Down to Rt toes    Pain Onset  More than a month ago    Pain Frequency  Intermittent    Aggravating Factors   At night increased burning and numbness    Pain Relieving Factors  standing, massage, reduce daily activites    Effect of Pain on Daily Activities  limited severly         [facility] PT  Assessment - 01/29/19 0001      Assessment   Medical Diagnosis  Rt LE Numbness    Referring Provider (PT)  Nita Sickle K, DO    Onset Date/Surgical Date  --   1.5 years ago   Hand Dominance  Right    Prior Therapy  yes for carpel tunnel      Precautions   Precautions  None      Palpation   Palpation comment  Reports of increased pain and tingling wiht palpation to Rt Psoas, no symptoms with [facility] Adult PT Treatment/Exercise - 01/29/19 0001      Exercises   Exercises  Lumbar      Lumbar Exercises: Stretches   Hip Flexor Stretch  10 seconds;Right;3 reps    Hip Flexor Stretch Limitations  psoas stretch- limited by pain wiht stretch      Lumbar Exercises: Seated   Long Arc Quad on Chair  10 reps      Lumbar Exercises:  Supine   Straight Leg Raise  10 reps      Lumbar Exercises: Prone   Other Prone Lumbar Exercises  Femoral flossing (felt tension at 45 degrees knee flexion)      Manual Therapy   Manual Therapy  Soft tissue mobilization    Manual therapy comments  Manual complete separate than rest of tx    Soft tissue mobilization  Rt psoas in semi-recumbent supine             PT Education - 01/29/19 1629    Education Details  Reviewed goals, assured compliance wiht HEP- reports he has completed 2-3 days since initial eval.  Able to recall 1/2 exercises.  Educated on beneftis of increased compliance at home.  Pt stated due to financial concerns may be only able to complet 1x a week.    Person(s) Educated  Patient    Methods  Explanation    Comprehension  Verbalized understanding       PT Short Term Goals - 01/22/19 1710      PT SHORT TERM GOAL #1   Title  Patient will be independent with HEP, updated PRN, to improve Rt LE strength for improved functional gait/mobility.    Time  2    Period  Weeks    Status  New    Target Date  02/05/19      PT SHORT TERM GOAL #2   Title  Patient will improve Rt LE strength for limited  muscle groups by 1/2 grade with MMT to indicate improvement in muscle fiber activation.    Time  3    Period  Weeks    Status  New    Target Date  02/12/19        PT Long Term Goals - 01/22/19 1717      PT LONG TERM GOAL #1   Title  Patient will no longer have symptoms radiating beyond knee to demonstrate reduced neurologic symptoms.    Time  6    Period  Weeks    Status  New    Target Date  03/05/19      PT LONG TERM GOAL #2   Title  Patient will improve Rt LE strength by 1 grade with MMT to improve functional gait and mobility.    Time  6    Period  Weeks    Status  New      PT LONG TERM GOAL #3   Title  Patient will report 50% reduction in Rt LE parasthesisa at night to improve ability to sleep for better QOL.    Time  6    Period  Weeks    Status  New            Plan - 01/29/19 1912    Clinical Impression Statement  Reviewed goals and assured compliance wiht HEP.  Pt reports he has complete HEP 2-3 times since initial eval.  Pt educated on importance of compliance wiht HEP, encouraged to increase compliance for maximal benefits.  Pt expressed possible need to reduce frequence to 1x/week due to financial issues, educated on importance of increased compliance necessary for maximal benefits, verbalized understanding.  Pt reoprts increased pain and symptoms down to toes wiht palpation of Rt psoas musculature, noted tightness and guarding with palpation; no s/s with Lt psoas. Began femoral nerve flossing, pt reports of increased symptoms in prone iwht knee flexion at 43 degrees.  Added psoas stretch with reports of tightness and some pain during  though improved wiht reps.    Personal Factors and Comorbidities  Time since onset of injury/illness/exacerbation    Examination-Activity Limitations  Stand;Squat;Locomotion Level;Sleep    Stability/Clinical Decision Making  Evolving/Moderate complexity    Clinical Decision Making  Moderate    Rehab Potential  Good    PT Frequency   2x / week    PT Duration  6 weeks    PT Treatment/Interventions  ADLs/Self Care Home Management;Electrical Stimulation;Cryotherapy;Moist Heat;Traction;DME Instruction;Gait training;Stair training;Functional mobility training;Therapeutic activities;Therapeutic exercise;Balance training;Neuromuscular re-education;Patient/family education;Manual techniques;Dry needling;Passive range of motion;Spinal Manipulations;Joint Manipulations;Taping    PT Next Visit Plan  Next session assess hip mobility and continue manual to psoas for femoral nerve impingement. Perform manual femoral nerve glides, start with segmental glides and progress when symptoms decrease. Perform Rt LE strengthening for muscles innervated by femoral nerve and L2-4 nerve roots (hip flexors, knee extensors, dorsiflexors).    PT Home Exercise Plan  Eval: LAQ, SLR;       Patient will benefit from skilled therapeutic intervention in order to improve the following deficits and impairments:  Abnormal gait, Decreased activity tolerance, Decreased balance, Decreased strength, Difficulty walking, Impaired sensation  Visit Diagnosis: Other symptoms and signs involving the nervous system  Muscle weakness (generalized)     Problem List Patient Active Problem List   Diagnosis Date Noted  . Discolored nails 11/01/2018  . Anxiety 01/29/2018  . Depression, major, single episode, severe ([place]) 01/29/2018  . Vitamin D deficiency 01/29/2018  . Bilateral carpal tunnel syndrome 01/14/2018  . Insomnia 07/11/2017  . Chronic hand pain 07/10/2017  . Annual physical exam 02/21/2016  . Overweight (BMI 25.0-29.9) 07/03/2013  . IGT (impaired glucose tolerance) 10/31/2012  . Perennial allergic rhinitis 10/19/2012  . Atopic eczema 10/19/2012   Ihor Austin, [place]; [place]  Aldona Lento 01/29/2019, 7:21 PM  [facility] [address] [place], [place], [address] Phone: [phone]   Fax:   [phone]  Name: Shaquelle Hernon MRN: [id number] Date of Birth: 10-Jul-1975

## 2019-02-02 ENCOUNTER — Telehealth (HOSPITAL_COMMUNITY): Payer: Self-pay | Admitting: Physical Therapy

## 2019-02-02 ENCOUNTER — Encounter (HOSPITAL_COMMUNITY): Payer: 59

## 2019-02-02 NOTE — Telephone Encounter (Signed)
pt called to cancel all of his weds appts due to he stated that he cannot afford to come twice a week

## 2019-02-03 ENCOUNTER — Ambulatory Visit (HOSPITAL_COMMUNITY): Payer: 59 | Admitting: Physical Therapy

## 2019-02-04 ENCOUNTER — Encounter (HOSPITAL_COMMUNITY): Payer: 59

## 2019-02-05 ENCOUNTER — Telehealth (HOSPITAL_COMMUNITY): Payer: Self-pay

## 2019-02-05 NOTE — Telephone Encounter (Signed)
pt called to cancel this appt due to he is stuck in traffic.

## 2019-02-08 ENCOUNTER — Encounter (HOSPITAL_COMMUNITY): Payer: 59

## 2019-02-10 ENCOUNTER — Encounter (HOSPITAL_COMMUNITY): Payer: 59 | Admitting: Physical Therapy

## 2019-02-11 ENCOUNTER — Ambulatory Visit (HOSPITAL_COMMUNITY): Payer: 59 | Attending: Family Medicine

## 2019-02-11 ENCOUNTER — Other Ambulatory Visit: Payer: Self-pay

## 2019-02-11 ENCOUNTER — Encounter (HOSPITAL_COMMUNITY): Payer: 59

## 2019-02-11 ENCOUNTER — Encounter (HOSPITAL_COMMUNITY): Payer: Self-pay

## 2019-02-11 DIAGNOSIS — R29818 Other symptoms and signs involving the nervous system: Secondary | ICD-10-CM | POA: Insufficient documentation

## 2019-02-11 DIAGNOSIS — M6281 Muscle weakness (generalized): Secondary | ICD-10-CM | POA: Diagnosis not present

## 2019-02-11 NOTE — Therapy (Signed)
[facility] [address] [place], [place], [address] Phone: [phone]   Fax:  [phone]  Physical Therapy Treatment  Patient Details  Name: Gabriel [name] MRN: [id number] Date of Birth: 1975-11-06 Referring Provider (PT): Glendale Chard, DO   Encounter Date: 02/11/2019  PT End of Session - 02/11/19 1751    Visit Number  3    Number of Visits  12    Date for PT Re-Evaluation  03/05/19    Authorization Type  [employer] ( $20 copay, no auth required, med review after 25th visit)    Authorization Time Period  01/22/2019-03/05/2019    Authorization - Visit Number  3    Authorization - Number of Visits  25   for med review   PT Start Time  1751    PT Stop Time  1832    PT Time Calculation (min)  41 min    Activity Tolerance  Patient limited by pain;Patient tolerated treatment well    Behavior During Therapy  [facility] for tasks assessed/performed       Past Medical History:  Diagnosis Date  . Carpal tunnel syndrome 2018  . Depression   . Heart murmur   . Seasonal allergies     Past Surgical History:  Procedure Laterality Date  . NO PAST SURGERIES      There were no vitals filed for this visit.  Subjective Assessment - 02/11/19 1747    Subjective  Pt stated he continues to have nagging pain and numbness down Rt LE, pain scale 5/10.  Reports increased symptoms following standing for 20-25 minutes         [facility] PT Assessment - 02/11/19 0001      Assessment   Medical Diagnosis  Rt LE Numbness    Referring Provider (PT)  Nita Sickle K, DO    Onset Date/Surgical Date  --   1.5 years ago   Hand Dominance  Right    Prior Therapy  yes for carpel tunnel      Precautions   Precautions  None      ROM / Strength   AROM / PROM / Strength  AROM      AROM   Overall AROM   Deficits    AROM Assessment Site  Hip    Right/Left Hip  Right;Left    Right Hip Flexion  55   limited by pain   Right Hip External Rotation   18    pain   Right Hip Internal Rotation   25   pain   Left Hip Flexion  75    Left Hip External Rotation   35    Left Hip Internal Rotation   28                   [facility] Adult PT Treatment/Exercise - 02/11/19 0001      Exercises   Exercises  Lumbar      Lumbar Exercises: Seated   Long Arc Quad on Chair  10 reps    Other Seated Lumbar Exercises  ankle pumps 10x      Lumbar Exercises: Prone   Other Prone Lumbar Exercises  Femoral flossing (felt tension at 45 degrees knee flexion)      Manual Therapy   Manual Therapy  Soft tissue mobilization    Manual therapy comments  Manual complete separate than rest of tx    Soft tissue mobilization  Rt psoas in semi-recumbent supine  PT Short Term Goals - 01/22/19 1710      PT SHORT TERM GOAL #1   Title  Patient will be independent with HEP, updated PRN, to improve Rt LE strength for improved functional gait/mobility.    Time  2    Period  Weeks    Status  New    Target Date  02/05/19      PT SHORT TERM GOAL #2   Title  Patient will improve Rt LE strength for limited muscle groups by 1/2 grade with MMT to indicate improvement in muscle fiber activation.    Time  3    Period  Weeks    Status  New    Target Date  02/12/19        PT Long Term Goals - 01/22/19 1717      PT LONG TERM GOAL #1   Title  Patient will no longer have symptoms radiating beyond knee to demonstrate reduced neurologic symptoms.    Time  6    Period  Weeks    Status  New    Target Date  03/05/19      PT LONG TERM GOAL #2   Title  Patient will improve Rt LE strength by 1 grade with MMT to improve functional gait and mobility.    Time  6    Period  Weeks    Status  New      PT LONG TERM GOAL #3   Title  Patient will report 50% reduction in Rt LE parasthesisa at night to improve ability to sleep for better QOL.    Time  6    Period  Weeks    Status  New            Plan - 02/11/19 1837    Clinical Impression  Statement  Assessed hip mobility, pt limited by pain with all motions and reduced range.  Session included femoral nerve flossing to address radicular symptoms and manual soft tissue mobilization to address Rt psoas spasm.  Added nerve glides to HEP (emailed for motion video via medbridge).    Personal Factors and Comorbidities  Time since onset of injury/illness/exacerbation    Examination-Activity Limitations  Stand;Squat;Locomotion Level;Sleep    Stability/Clinical Decision Making  Evolving/Moderate complexity    Clinical Decision Making  Moderate    Rehab Potential  Good    PT Frequency  2x / week    PT Duration  6 weeks    PT Treatment/Interventions  ADLs/Self Care Home Management;Electrical Stimulation;Cryotherapy;Moist Heat;Traction;DME Instruction;Gait training;Stair training;Functional mobility training;Therapeutic activities;Therapeutic exercise;Balance training;Neuromuscular re-education;Patient/family education;Manual techniques;Dry needling;Passive range of motion;Spinal Manipulations;Joint Manipulations;Taping    PT Next Visit Plan  Find pain free range and complete therex to reduce irritation.  Continue manual to psoas for femoral nerve impingement. Perform manual femoral nerve glides, start with segmental glides and progress when symptoms decrease. Perform Rt LE strengthening for muscles innervated by femoral nerve and L2-4 nerve roots (hip flexors, knee extensors, dorsiflexors).    PT Home Exercise Plan  Eval: LAQ, SLR; 10/01: prone femoral nerve glide       Patient will benefit from skilled therapeutic intervention in order to improve the following deficits and impairments:  Abnormal gait, Decreased activity tolerance, Decreased balance, Decreased strength, Difficulty walking, Impaired sensation  Visit Diagnosis: Other symptoms and signs involving the nervous system  Muscle weakness (generalized)     Problem List Patient Active Problem List   Diagnosis Date Noted  .  Discolored nails 11/01/2018  .  Anxiety 01/29/2018  . Depression, major, single episode, severe ([place]) 01/29/2018  . Vitamin D deficiency 01/29/2018  . Bilateral carpal tunnel syndrome 01/14/2018  . Insomnia 07/11/2017  . Chronic hand pain 07/10/2017  . Annual physical exam 02/21/2016  . Overweight (BMI 25.0-29.9) 07/03/2013  . IGT (impaired glucose tolerance) 10/31/2012  . Perennial allergic rhinitis 10/19/2012  . Atopic eczema 10/19/2012   Ihor Austin, LPTA; [place]  Aldona Lento 02/11/2019, 6:45 PM  [facility] [address] [place], [place], [address] Phone: [phone]   Fax:  [phone]  Name: Gabriel [name] MRN: [id number] Date of Birth: 1975-06-29

## 2019-02-12 ENCOUNTER — Encounter (HOSPITAL_COMMUNITY): Payer: 59

## 2019-02-15 ENCOUNTER — Encounter (HOSPITAL_COMMUNITY): Payer: Self-pay | Admitting: Psychiatry

## 2019-02-15 ENCOUNTER — Other Ambulatory Visit: Payer: Self-pay

## 2019-02-15 ENCOUNTER — Ambulatory Visit (INDEPENDENT_AMBULATORY_CARE_PROVIDER_SITE_OTHER): Payer: 59 | Admitting: Psychiatry

## 2019-02-15 DIAGNOSIS — F321 Major depressive disorder, single episode, moderate: Secondary | ICD-10-CM

## 2019-02-15 NOTE — Progress Notes (Signed)
Virtual Visit via Video Note  I connected with Benedetto Goad on 02/15/19 at  3:00 PM EDT by a video enabled telemedicine application and verified that I am speaking with the correct person using two identifiers.   I discussed the limitations of evaluation and management by telemedicine and the availability of in person appointments. The patient expressed understanding and agreed to proceed.    I provided 50  minutes of non-face-to-face time during this encounter.   [name], LCSW              THERAPIST PROGRESS NOTE  Session Time:  Monday 02/15/2019 3:15 PM - 4:05 PM    Participation Level: Active  Behavioral Response: AlertDepressed  Type of Therapy: Individual Therapy  Treatment Goals addressed: Establish rapport, alleviate depressive symptoms and resume normal involvement and activity and social activity, learn and implement cognitive and behavioral strategies to overcome depression,   Interventions: CBT and Supportive  Summary: Gabriel [name] is a 43 y.o. male who is referred for services by PCP Dr. Tula Nakayama due to patient experiencing symptoms of depression. He denies any psychiatric hospitalizations, He saw therapist Youlanda Roys in [place] for a few months. Patient reports symptoms began when he lost his job about a year ago. Per his report, he was fired after he complained about race discrimination. 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 He states now feeling as though he failed his family. He states not wanting to be around anyone and having difficulty engaging with his wife and children. He fears losing his family.  He reports ruminating thoughts about job and the past, excessive worry depressed mood, tearfulness, anxiety, isolative behaviors, poor motivation, poor concentration, irritability, sleep difficulty, thoughts and  feelings of hopelessness and worthlessness. He denies any suicidal ideations.  Patient last was seen via virtual visit about two months ago. He reports memory difficulty and poor concentration caused him to miss previous appointments. He reports feeling more depressed since last session. He reports going to physical therapy 1 x per week but little to no involvement in other activity. He continues to experience poor motivation and diminished interest and pleasure in activities. He says he was trying to help his two children, a first grader and a fourth grader, with virtual school but being unable to do so due to poor concentration. He reports trying  to stay engaged with family but states just wanting to be alone.  He reports ruminating thoughts about losing his job and expresses feelings of anger, disappointment, hurt, betrayal, and guilt. He reports continued difficulty falling and staying asleep. He continues to report thoughts and feelings of hopelessness and worthlessness but denies any suicidal ideations.   Suicidal/Homicidal: Nowithout intent/plan  Therapist Response:  reviewed symptoms, administered PHQ-9, facilitated patient sharing narrative of how he lost his job, facilitated expression of thoughts and feelings regarding this, validated feelings, discussed the effects of loss on his current functioning and his family, began to orient patient to CBT and assist patient to identify connection between thoughts, mood, and behavior, began to assist patient identify his values to try determine motivation and activities to increase behavioral activation through value congruent behavior   Plan: Return again in 2 weeks.  Diagnosis: Axis I: MDD, single episode, moderate    Axis II: No diagnosis    Joseff Luckman E Javarius Tsosie, LCSW  02/15/2019 

## 2019-02-16 ENCOUNTER — Other Ambulatory Visit: Payer: Self-pay

## 2019-02-16 ENCOUNTER — Encounter (HOSPITAL_COMMUNITY): Payer: Self-pay

## 2019-02-16 ENCOUNTER — Ambulatory Visit (HOSPITAL_COMMUNITY): Payer: 59

## 2019-02-16 ENCOUNTER — Encounter (HOSPITAL_COMMUNITY): Payer: 59

## 2019-02-16 DIAGNOSIS — M6281 Muscle weakness (generalized): Secondary | ICD-10-CM | POA: Diagnosis not present

## 2019-02-16 DIAGNOSIS — R29818 Other symptoms and signs involving the nervous system: Secondary | ICD-10-CM | POA: Diagnosis not present

## 2019-02-16 NOTE — Therapy (Signed)
[facility] [place], [place], [address] Phone: [phone]   Fax:  [phone]  Physical Therapy Treatment  Patient Details  Name: Gabriel [name] MRN: [id number] Date of Birth: 1975/12/18 Referring Provider (PT): Alda Berthold, DO   Encounter Date: 02/16/2019  PT End of Session - 02/16/19 1754    Visit Number  4    Number of Visits  12    Date for PT Re-Evaluation  03/05/19    Authorization Type  [employer] ( $20 copay, no auth required, med review after 25th visit)    Authorization Time Period  01/22/2019-03/05/2019    Authorization - Visit Number  4    Authorization - Number of Visits  25   for med review   PT Start Time  1746    PT Stop Time  1826    PT Time Calculation (min)  40 min    Activity Tolerance  Patient limited by pain;Patient tolerated treatment well    Behavior During Therapy  [facility] for tasks assessed/performed       Past Medical History:  Diagnosis Date  . Carpal tunnel syndrome 2018  . Depression   . Heart murmur   . Seasonal allergies     Past Surgical History:  Procedure Laterality Date  . NO PAST SURGERIES      There were no vitals filed for this visit.  Subjective Assessment - 02/16/19 1752    Subjective  Pt stated his pain scale 5/10 with nagging/tingling pain down Rt LE to toes.  Reports he did some exercise over weekend and didnt increase pain.    Currently in Pain?  Yes    Pain Score  5     Pain Location  Back    Pain Orientation  Right;Lower    Pain Descriptors / Indicators  Nagging;Tingling    Pain Type  Chronic pain    Pain Radiating Towards  Rt LE lateral tingling down to Rt toes    Pain Onset  More than a month ago    Pain Frequency  Intermittent    Aggravating Factors   At night increased burning and numbness    Pain Relieving Factors  standing, massage, reduce daily activites    Effect of Pain on Daily Activities  limited severly                        [facility] Adult PT Treatment/Exercise - 02/16/19 0001      Lumbar Exercises: Supine   Other Supine Lumbar Exercises  90/90 isometric wiht hip extension (glut sets) and hip flexion (into therapist elbow)     Other Supine Lumbar Exercises  90/90 isometric abdominal contractions (push my fingers out)      Manual Therapy   Manual Therapy  Soft tissue mobilization    Manual therapy comments  Manual complete separate than rest of tx    Soft tissue mobilization  Rt psoas very gentle trigger point; 90/90               PT Short Term Goals - 01/22/19 1710      PT SHORT TERM GOAL #1   Title  Patient will be independent with HEP, updated PRN, to improve Rt LE strength for improved functional gait/mobility.    Time  2    Period  Weeks    Status  New    Target Date  02/05/19      PT SHORT TERM  GOAL #2   Title  Patient will improve Rt LE strength for limited muscle groups by 1/2 grade with MMT to indicate improvement in muscle fiber activation.    Time  3    Period  Weeks    Status  New    Target Date  02/12/19        PT Long Term Goals - 01/22/19 1717      PT LONG TERM GOAL #1   Title  Patient will no longer have symptoms radiating beyond knee to demonstrate reduced neurologic symptoms.    Time  6    Period  Weeks    Status  New    Target Date  03/05/19      PT LONG TERM GOAL #2   Title  Patient will improve Rt LE strength by 1 grade with MMT to improve functional gait and mobility.    Time  6    Period  Weeks    Status  New      PT LONG TERM GOAL #3   Title  Patient will report 50% reduction in Rt LE parasthesisa at night to improve ability to sleep for better QOL.    Time  6    Period  Weeks    Status  New            Plan - 02/16/19 1832    Clinical Impression Statement  Session focus on exercises to reduce radicular symptoms.  Added isometric then co-contraction with glut, psoas and abdominal.  Exercises complete in 90/90  position for psoas relaxation.  Gentle STM complete to psoas as well during session.  Pt reports decreased radicular intensity following session.    Personal Factors and Comorbidities  Time since onset of injury/illness/exacerbation    Examination-Activity Limitations  Stand;Squat;Locomotion Level;Sleep    Stability/Clinical Decision Making  Evolving/Moderate complexity    Clinical Decision Making  Moderate    Rehab Potential  Good    PT Frequency  2x / week    PT Duration  6 weeks    PT Treatment/Interventions  ADLs/Self Care Home Management;Electrical Stimulation;Cryotherapy;Moist Heat;Traction;DME Instruction;Gait training;Stair training;Functional mobility training;Therapeutic activities;Therapeutic exercise;Balance training;Neuromuscular re-education;Patient/family education;Manual techniques;Dry needling;Passive range of motion;Spinal Manipulations;Joint Manipulations;Taping    PT Next Visit Plan  Further assessment for Psoas and radicular symptoms with PT next session.    PT Home Exercise Plan  Eval: LAQ, SLR; 10/01: prone femoral nerve glide       Patient will benefit from skilled therapeutic intervention in order to improve the following deficits and impairments:  Abnormal gait, Decreased activity tolerance, Decreased balance, Decreased strength, Difficulty walking, Impaired sensation  Visit Diagnosis: Other symptoms and signs involving the nervous system  Muscle weakness (generalized)     Problem List Patient Active Problem List   Diagnosis Date Noted  . Discolored nails 11/01/2018  . Anxiety 01/29/2018  . Depression, major, single episode, severe ([facility]) 01/29/2018  . Vitamin D deficiency 01/29/2018  . Bilateral carpal tunnel syndrome 01/14/2018  . Insomnia 07/11/2017  . Chronic hand pain 07/10/2017  . Annual physical exam 02/21/2016  . Overweight (BMI 25.0-29.9) 07/03/2013  . IGT (impaired glucose tolerance) 10/31/2012  . Perennial allergic rhinitis 10/19/2012  .  Atopic eczema 10/19/2012   Becky Sax, LPTA; CBIS [phone]  Juel Burrow 02/16/2019, 6:38 PM  [facility] [address] [place], [place], [address] Phone: [phone]   Fax:  [phone]  Name: Gabriel [name] MRN: [id number] Date of Birth: [birth year]

## 2019-02-17 ENCOUNTER — Encounter (HOSPITAL_COMMUNITY): Payer: 59

## 2019-02-19 ENCOUNTER — Encounter (HOSPITAL_COMMUNITY): Payer: 59

## 2019-02-24 ENCOUNTER — Encounter (HOSPITAL_COMMUNITY): Payer: 59 | Admitting: Physical Therapy

## 2019-02-24 ENCOUNTER — Other Ambulatory Visit: Payer: Self-pay

## 2019-02-24 ENCOUNTER — Ambulatory Visit (INDEPENDENT_AMBULATORY_CARE_PROVIDER_SITE_OTHER): Payer: 59

## 2019-02-24 DIAGNOSIS — Z23 Encounter for immunization: Secondary | ICD-10-CM

## 2019-02-25 NOTE — Progress Notes (Signed)
Virtual Visit via Video Note  I connected with Gabriel [name] on 03/04/19 at  1:30 PM EDT by a video enabled telemedicine application and verified that I am speaking with the correct person using two identifiers.   I discussed the limitations of evaluation and management by telemedicine and the availability of in person appointments. The patient expressed understanding and agreed to proceed.      I discussed the assessment and treatment plan with the patient. The patient was provided an opportunity to ask questions and all were answered. The patient agreed with the plan and demonstrated an understanding of the instructions.   The patient was advised to call back or seek an in-person evaluation if the symptoms worsen or if the condition fails to improve as anticipated.  I provided 25 minutes of non-face-to-face time during this encounter.   [name], MD    [facility] MD/PA/NP OP Progress Note  03/04/2019 2:02 PM Gabriel [name]  MRN:  [id number]  Chief Complaint:  Chief Complaint    Anxiety; Depression; Follow-up     HPI:  This is a follow-up appointment for depression and anxiety.  He states that he is not doing well. He feels "powerless" as he is unable to provide to his family. He feels that "all my dreams are dead" while he used to put his family in the back burner and work seven days a week for his family. He is also concerned that his connection with his wife is gone, although he knows that she loves him and he loves her. He stays in the room, wearing pajama all day long. His children (age 79,6) is at home doing online, while he is unable to help him.  He has middle insomnia with anxiety.  He feels depressed.  He has more crying spells.  He feels fatigue and has anhedonia, although he used to enjoy being physically active.  He has difficulty in concentration.  He denies SI.  He had a few times of panic attacks. He eats 1-3 times per day. He denies any recent weight change. He  believes quetiapine makes him drowsy. He has limited benefit from uptitration of ambien.    Wt Readings from Last 3 Encounters:  12/29/18 210 lb (95.3 kg)  12/18/18 200 lb (90.7 kg)  10/28/18 218 lb 0.6 oz (98.9 kg)    Visit Diagnosis:    ICD-10-CM   1. Current severe episode of major depressive disorder without psychotic features without prior episode ([place])  F32.2     Past Psychiatric History: Please see initial evaluation for full details. I have reviewed the history. No updates at this time.     Past Medical History:  Past Medical History:  Diagnosis Date  . Carpal tunnel syndrome 2018  . Depression   . Heart murmur   . Seasonal allergies     Past Surgical History:  Procedure Laterality Date  . NO PAST SURGERIES      Family Psychiatric History: Please see initial evaluation for full details. I have reviewed the history. No updates at this time.     Family History:  Family History  Problem Relation Age of Onset  . Diabetes Father   . Hypertension Father   . Hypertension Mother   . Cancer Maternal Grandmother   . Cancer Maternal Grandfather   . Hypertension Maternal Grandfather   . Cancer Paternal Grandmother   . Hypertension Paternal Grandmother   . Cancer Paternal Grandfather   . Hypertension Paternal Grandfather     Social  History:  Social History   Socioeconomic History  . Marital status: Married    Spouse name: Not on file  . Number of children: 2  . Years of education: 81  . Highest education level: High school graduate  Occupational History  . Not on file  Social Needs  . Financial resource strain: Somewhat hard  . Food insecurity    Worry: Never true    Inability: Never true  . Transportation needs    Medical: No    Non-medical: No  Tobacco Use  . Smoking status: Never Smoker  . Smokeless tobacco: Never Used  Substance and Sexual Activity  . Alcohol use: No    Alcohol/week: 0.0 standard drinks  . Drug use: No  . Sexual activity: Yes     Birth control/protection: None  Lifestyle  . Physical activity    Days per week: Not on file    Minutes per session: Not on file  . Stress: Very much  Relationships  . Social Musician on phone: Not on file    Gets together: Not on file    Attends religious service: Not on file    Active member of club or organization: Not on file    Attends meetings of clubs or organizations: Not on file    Relationship status: Not on file  Other Topics Concern  . Not on file  Social History Narrative   He lives with wife and two children.   Unemployed at this present time   Right handed   One story home   Highest level of education:  12th grade    Allergies: No Known Allergies  Metabolic Disorder Labs: Lab Results  Component Value Date   HGBA1C 6.4 (H) 12/29/2018   MPG 137 12/29/2018   MPG 123 12/04/2016   No results found for: PROLACTIN Lab Results  Component Value Date   CHOL 183 12/29/2018   TRIG 72 12/29/2018   HDL 39 (L) 12/29/2018   CHOLHDL 4.7 12/29/2018   VLDL 10 12/04/2016   LDLCALC 128 (H) 12/29/2018   LDLCALC 108 (H) 02/18/2018   Lab Results  Component Value Date   TSH 2.14 12/29/2018   TSH 2.11 07/16/2017    Therapeutic Level Labs: No results found for: LITHIUM No results found for: VALPROATE No components found for:  CBMZ  Current Medications: Current Outpatient Medications  Medication Sig Dispense Refill  . ARIPiprazole (ABILIFY) 2 MG tablet Take 1 tablet (2 mg total) by mouth at bedtime. 30 tablet 0  . hydrocortisone 2.5 % cream Apply 1 application topically as directed.    . loratadine (CLARITIN) 10 MG tablet Take 1 tablet (10 mg total) by mouth daily. 90 tablet 3  . LORazepam (ATIVAN) 0.5 MG tablet Take 1 tablet (0.5 mg total) by mouth 2 (two) times daily as needed for anxiety. 60 tablet 0  . mometasone (NASONEX) 50 MCG/ACT nasal spray Place 2 sprays into the nose daily. 34 g 2  . tretinoin (RETIN-A) 0.025 % cream Apply 1 application  topically at bedtime.    Marland Kitchen venlafaxine XR (EFFEXOR-XR) 150 MG 24 hr capsule 225 mg daily (75 mg +150 mg ) 90 capsule 0  . venlafaxine XR (EFFEXOR-XR) 75 MG 24 hr capsule 225 mg daily (75 mg +150 mg ) 90 capsule 0   No current facility-administered medications for this visit.      Musculoskeletal: Strength & Muscle Tone: N/A Gait & Station: N/A Patient leans: N/A  Psychiatric Specialty Exam: Review  of Systems  Psychiatric/Behavioral: Positive for depression. Negative for hallucinations, memory loss, substance abuse and suicidal ideas. The patient is nervous/anxious and has insomnia.   All other systems reviewed and are negative.   There were no vitals taken for this visit.There is no height or weight on file to calculate BMI.  General Appearance: Fairly Groomed  Eye Contact:  Good  Speech:  Clear and Coherent  Volume:  Normal  Mood:  Anxious and Depressed  Affect:  Appropriate, Congruent and Restricted  Thought Process:  Coherent  Orientation:  Full (Time, Place, and Person)  Thought Content: Logical   Suicidal Thoughts:  No  Homicidal Thoughts:  No  Memory:  Immediate;   Good  Judgement:  Good  Insight:  Good  Psychomotor Activity:  Normal  Concentration:  Concentration: Poor and Attention Span: Poor  Recall:  Good  Fund of Knowledge: Good  Language: Good  Akathisia:  No  Handed:  Right  AIMS (if indicated): not done  Assets:  Communication Skills Desire for Improvement  ADL's:  Intact  Cognition: WNL  Sleep:  Poor   Screenings: GAD-7     Office Visit from 12/29/2018 in [facility] Office Visit from 10/28/2018 in [facility] Office Visit [facility]2018 [facility] 01/[facility]2018 [facility] from 12/04/2017 in [facility]  Total GAD-7 Score  21  20  15  10  15     PHQ2-9     [facility] Visit from 12/29/2018 in [facility] Office Visit from 10/28/2018 in [facility] Office Visit from 08/12/2018 in [facility] Visit [facility] 03/17/2018 in [facility]  PHQ-2 Total Score  6  6  3  6  6   PHQ-9 Total Score  22  21  18  14  22        Assessment and Plan:  Gabriel Settingerrence [name] is a 43 y.o. year old male with a history of depression, ,bilateral carpel tunnel syndrome , who presents for follow up appointment for Current severe episode of major depressive disorder without psychotic features without prior episode ([facility])  # MDD, severe, single without psychotic features Exam is notable for worsening in impairment in attention, and he reports worsening in depressive symptoms, anxiety with prominent fatigue/anhedonia since the last visit despite up titration of quetiapine.  Psychosocial stressors includes unemployment, termination of job, which he perceives as racial discrimination.  Will switch from quetiapine to Abilify as adjunctive treatment for depression.  Discussed potential metabolic side effects, drowsiness and EPS.  We will continue venlafaxine to target depression.  We will discontinue Ambien given limited benefit.  Will start lorazepam as needed for anxiety.  Discussed risk of dependence and oversedation. Explored his value of connection with his family, and value congruent action he can takes.  He is encouraged to continue to see Ms. Bynum for therapy.   Plan 1.Continuevenlafaxine 225mg  (150 mg + 75 mg)daily 2. Discontinue quetiapine 3. Start Abilify 2 mg daily  4. Discontinue Ambien 5. Start lorazepam 0.5 mg twice a day as needed for anxiety  6. Next appointment: 11/17 at 1:30 for 30 mins, video  Past trials of medication:sertraline, fluoxetine,mirtazapine, nortriptyline for carpel tunnel syndrome, quetiapine (drowsiness), Trazodone, Ambien (limited benefit)  The patient demonstrates the following risk  factors for suicide: Chronic risk factors for suicide include:psychiatric disorder ofdepression. Acute risk factorsfor suicide  include: unemployment and loss (financial, interpersonal, professional). Protective factorsfor this patient include: positive social support, responsibility to others (children, family) and hope for the future. Considering these factors, the overall suicide risk at this point appears to below. Patientisappropriate for outpatient follow up.  The duration of this appointment visit was 25 minutes of non face-to-face time with the patient.  Greater than 50% of this time was spent in counseling, explanation of  diagnosis, planning of further management, and coordination of care.  [name], MD 03/04/2019, 2:02 PM

## 2019-02-26 ENCOUNTER — Telehealth: Payer: Self-pay

## 2019-02-26 ENCOUNTER — Telehealth (HOSPITAL_COMMUNITY): Payer: Self-pay

## 2019-02-26 ENCOUNTER — Encounter (HOSPITAL_COMMUNITY): Payer: Self-pay

## 2019-02-26 ENCOUNTER — Ambulatory Visit (HOSPITAL_COMMUNITY): Payer: 59

## 2019-02-26 ENCOUNTER — Other Ambulatory Visit: Payer: Self-pay

## 2019-02-26 DIAGNOSIS — M6281 Muscle weakness (generalized): Secondary | ICD-10-CM | POA: Diagnosis not present

## 2019-02-26 DIAGNOSIS — R29818 Other symptoms and signs involving the nervous system: Secondary | ICD-10-CM

## 2019-02-26 NOTE — Telephone Encounter (Signed)
Therapist called referring physician regarding pt's lack of progress with skilled PT interventions. Briefly summarized reassessment to physician regarding positive RLE clonus, reflexes, RLE weakness, constant pain and increasing pain/tingling with stretches and exercises, and no change in functional status since beginning therapy. Physician agrees in further evaluation and reports they will get the pt back in the clinic to assess.   Talbot Grumbling PT, DPT 02/26/19, 4:06 PM [phone]

## 2019-02-26 NOTE — Therapy (Addendum)
[place], [place], [address] Phone: [phone]   Fax:  [phone]   PHYSICAL THERAPY DISCHARGE SUMMARY  Visits from Start of Care: 5  Current functional level related to goals / functional outcomes: See below   Remaining deficits: See below   Education / Equipment: HEP, return to referring MD for further evaluation  Plan: Patient agrees to discharge.  Patient goals were not met. Patient is being discharged due to lack of progress.  ?????       Physical Therapy Treatment  Patient Details  Name: Gabriel [name] MRN: [id number] Date of Birth: 09/24/1975 Referring Provider (PT): Alda Berthold, DO   Encounter Date: 02/26/2019  PT End of Session - 02/26/19 1531    Visit Number  5    Number of Visits  12    Date for PT Re-Evaluation  03/05/19    Authorization Type  [employer] [id number] copay, no auth required, med review after 25th visit)    Authorization Time Period  01/22/2019-03/05/2019    Authorization - Visit Number  5    Authorization - Number of Visits  25   for med review   PT Start Time  1435    PT Stop Time  1520    PT Time Calculation (min)  45 min    Activity Tolerance  Patient limited by pain;Patient tolerated treatment well    Behavior During Therapy  [facility] for tasks assessed/performed       Past Medical History:  Diagnosis Date  . Carpal tunnel syndrome 2018  . Depression   . Heart murmur   . Seasonal allergies     Past Surgical History:  Procedure Laterality Date  . NO PAST SURGERIES      There were no vitals filed for this visit.  Subjective Assessment - 02/26/19 1438    Subjective  Pt reports no overall changes since starting therapy. Pt denies "pain" just reports "nagging tingling feeling like when you hit your funny bone".    Limitations  Standing;Walking    How long can you sit comfortably?  sitting helps sometimes - pt reports it takes pressure off something and  helps, however he can't stay still too long    How long can you stand comfortably?  30 minutes - 45 minutes maybe before it gets worse    How long can you walk comfortably?  supposed to be walking every day but has fallen off of this plan due to Rt LE weakness and depression    Currently in Pain?  Yes    Pain Score  6     Pain Location  Back    Pain Orientation  Lower;Right    Pain Descriptors / Indicators  Tingling;Nagging    Pain Type  Chronic pain    Pain Radiating Towards  Rt groin, anterior thigh, down to all 5 toes    Pain Onset  More than a month ago    Pain Frequency  Intermittent    Aggravating Factors   At night increased burning and numbness    Pain Relieving Factors  standing, massage, reduce daily activites    Effect of Pain on Daily Activities  limited severly         [facility] PT Assessment - 02/26/19 0001      Assessment   Medical Diagnosis  Rt LE Numbness    Referring Provider (PT)  Alda Berthold, DO    Onset Date/Surgical Date  --  approx 1 1/2 years ago   Hand Dominance  Right    Prior Therapy  yes for carpel tunnel      Precautions   Precautions  None      Restrictions   Weight Bearing Restrictions  No      Balance Screen   Has the patient fallen in the past 6 months  No    Has the patient had a decrease in activity level because of a fear of falling?   No    Is the patient reluctant to leave their home because of a fear of falling?   No      Cognition   Overall Cognitive Status  Within Functional Limits for tasks assessed      Observation/Other Assessments   Focus on Therapeutic Outcomes (FOTO)   n/a      Sensation   Light Touch  Impaired Detail    Light Touch Impaired Details  Impaired RLE   R anterior thigh, all 5 toes on R foot decreased sensation     Deep Tendon Reflexes   DTR Assessment Site  Patella;Achilles   RLE   Patella DTR  1+    Achilles DTR  1+      Strength   Right Hip Flexion  3+/5   3-   Right Hip Extension  3+/5   5    Right Hip ABduction  3+/5   4   Left Hip Flexion  5/5   4+   Left Hip Extension  5/5   5   Left Hip ABduction  5/5   5   Right Knee Flexion  3+/5   4   Right Knee Extension  4+/5   4-   Left Knee Flexion  4+/5   5   Left Knee Extension  5/5   5   Right Ankle Dorsiflexion  3+/5   4-   Left Ankle Dorsiflexion  4/5   5     Prone Knee Bend Test   Findings  Positive    Side  Right    Comment  increased tingling/pain in R anterior thigh and toes      RLE Tone   RLE Tone  Other (comment)   CLonus: Positive 4 beats RLE         [facility] Adult PT Treatment/Exercise - 02/26/19 0001      Ambulation/Gait   Gait Comments  Antalgic, decreased R SLS time      Lumbar Exercises: Stretches   Hip Flexor Stretch  Right;3 reps;10 seconds    Hip Flexor Stretch Limitations  half kneeling on foam, increased pain with reps    Quad Stretch  Right;2 reps;10 seconds    Quad Stretch Limitations  with strap, increased pain    Figure 4 Stretch  20 seconds   RLE, increased pain     Lumbar Exercises: Supine   Bridge Limitations  3 reps with therapist providing abduction resistance    Other Supine Lumbar Exercises  90/90 isometric hip extension (therapist resistance) and hip flexion (therapist resistance), x10 reps each      Manual Therapy   Manual Therapy  Soft tissue mobilization    Manual therapy comments  Manual complete separate than rest of tx    Soft tissue mobilization  Pt supine, 90/90 position and BLE on wedge, gentle pressure R psoas, quad, IT band to decrease pain             PT Education - 02/26/19 1530  Education Details  Reassessment findings due to no change in status with therapy, discharging from therapy and referring back to physician for further assessment    Person(s) Educated  Patient    Methods  Explanation    Comprehension  Verbalized understanding       PT Short Term Goals - 02/26/19 1511      PT SHORT TERM GOAL #1   Title  Patient will be independent  with HEP, updated PRN, to improve Rt LE strength for improved functional gait/mobility.    Baseline  10/16: "I do the best I can at home, but I'm still having the same results"    Time  2    Period  Weeks    Status  Not Met    Target Date  02/05/19      PT SHORT TERM GOAL #2   Title  Patient will improve Rt LE strength for limited muscle groups by 1/2 grade with MMT to indicate improvement in muscle fiber activation.    Baseline  10/16: see MMT    Time  3    Period  Weeks    Status  Not Met    Target Date  02/12/19        PT Long Term Goals - 02/26/19 1511      PT LONG TERM GOAL #1   Title  Patient will no longer have symptoms radiating beyond knee to demonstrate reduced neurologic symptoms.    Baseline  10/16: symptoms still going down into toes    Time  6    Period  Weeks    Status  Not Met      PT LONG TERM GOAL #2   Title  Patient will improve Rt LE strength by 1 grade with MMT to improve functional gait and mobility.    Baseline  10/16: see MMT    Time  6    Period  Weeks    Status  Not Met      PT LONG TERM GOAL #3   Title  Patient will report 50% reduction in Rt LE parasthesisa at night to improve ability to sleep for better QOL.    Baseline  10/16: 0% reduction in RLE paraesthesia    Time  6    Period  Weeks    Status  Not Met            Plan - 02/26/19 1550    Clinical Impression Statement  Began treatment session with attempted hip flexor stretch in [facility] test position off mat table, unable to determine if positive for tightness at hip and knee or increased pain causing pt unable to relax with stretch, so discontinued. Pt tolerates isometric hip flexion and hip extension with RLE into therapist resistance, but with repeated reps increased pain so discontinued. Pt with increased pain with prone quad stretch using strap in R quad and R calf. Pt performed hip flexor stretch in half kneeling position on foam pad, tolerating initial rep, but with increased pain  with subsequent reps. Subjectively, pt without any change in functional status since starting therapy. Objectively, pt with trace reflexes in RLE, positive clonus of 4 beats on RLE, and decrease in RLE strength. Pt complains of pain and increase in pain/tingling in R groin, anterior thigh and all 5 toes with all mobility throughout session. Pt denies weakness in RLE causing falls or changes in bowel/bladder control. Pt has made no progress towards goals and is appropriate to d/c at this time, referring  pt back to referring physician for further evaluation due to lack of progress and no change with skilled PT interventions at this time.    Personal Factors and Comorbidities  Time since onset of injury/illness/exacerbation    Examination-Activity Limitations  Stand;Squat;Locomotion Level;Sleep    Stability/Clinical Decision Making  Evolving/Moderate complexity    Rehab Potential  Good    PT Frequency  2x / week    PT Duration  6 weeks    PT Treatment/Interventions  ADLs/Self Care Home Management;Electrical Stimulation;Cryotherapy;Moist Heat;Traction;DME Instruction;Gait training;Stair training;Functional mobility training;Therapeutic activities;Therapeutic exercise;Balance training;Neuromuscular re-education;Patient/family education;Manual techniques;Dry needling;Passive range of motion;Spinal Manipulations;Joint Manipulations;Taping    PT Next Visit Plan  d/c to HEP and referring back to physician for further evaluation    PT Home Exercise Plan  Eval: LAQ, SLR; 10/01: prone femoral nerve glide    Consulted and Agree with Plan of Care  Patient       Patient will benefit from skilled therapeutic intervention in order to improve the following deficits and impairments:  Abnormal gait, Decreased activity tolerance, Decreased balance, Decreased strength, Difficulty walking, Impaired sensation  Visit Diagnosis: Other symptoms and signs involving the nervous system  Muscle weakness  (generalized)     Problem List Patient Active Problem List   Diagnosis Date Noted  . Discolored nails 11/01/2018  . Anxiety 01/29/2018  . Depression, major, single episode, severe ([place]) 01/29/2018  . Vitamin D deficiency 01/29/2018  . Bilateral carpal tunnel syndrome 01/14/2018  . Insomnia 07/11/2017  . Chronic hand pain 07/10/2017  . Annual physical exam 02/21/2016  . Overweight (BMI 25.0-29.9) 07/03/2013  . IGT (impaired glucose tolerance) 10/31/2012  . Perennial allergic rhinitis 10/19/2012  . Atopic eczema 10/19/2012     Talbot Grumbling PT, DPT 02/26/19, 3:54 PM [place] [address] [place], [place], [address] Phone: [phone]   Fax:  [phone]  Name: Gabriel [name] MRN: [id number] Date of Birth: 01-Dec-1975

## 2019-02-26 NOTE — Telephone Encounter (Signed)
[facility] called and spoke with Dr.Patel and verbal report given.Patient not progressing with PT,will bring patient back in office for evaulation.

## 2019-03-01 ENCOUNTER — Ambulatory Visit (INDEPENDENT_AMBULATORY_CARE_PROVIDER_SITE_OTHER): Payer: 59 | Admitting: Psychiatry

## 2019-03-01 ENCOUNTER — Other Ambulatory Visit: Payer: Self-pay

## 2019-03-01 DIAGNOSIS — F321 Major depressive disorder, single episode, moderate: Secondary | ICD-10-CM

## 2019-03-01 NOTE — Progress Notes (Signed)
Virtual Visit via Video Note  I connected with Gabriel [name] on 03/01/19 at  4:00 PM EDT by a video enabled telemedicine application and verified that I am speaking with the correct person using two identifiers.   I discussed the limitations of evaluation and management by telemedicine and the availability of in person appointments. The patient expressed understanding and agreed to proceed.   I provided 50 minutes of non-face-to-face time during this encounter.   [name], LCSW               THERAPIST PROGRESS NOTE  Session Time:  Monday 03/01/2019 4:00 PM - 4:50 PM  Participation Level: Active  Behavioral Response: AlertDepressed  Type of Therapy: Individual Therapy  Treatment Goals addressed: Establish rapport, alleviate depressive symptoms and resume normal involvement and activity and social activity, learn and implement cognitive and behavioral strategies to overcome depression,   Interventions: CBT and Supportive  Summary: Gabriel [name] is a 43 y.o. male who is referred for services by PCP Dr. Tula Nakayama due to patient experiencing symptoms of depression. He denies any psychiatric hospitalizations, He saw therapist Youlanda Roys in [place] for a few months. Patient reports symptoms began when he lost his job about a year ago. Per his report, he was fired after he complained about race discrimination. 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 He states now feeling as though he failed his family. He states not wanting to be around anyone and having difficulty engaging with his wife and children. He fears losing his family.  He reports ruminating thoughts about job and the past, excessive worry depressed mood, tearfulness, anxiety, isolative behaviors, poor motivation, poor concentration, irritability, sleep difficulty, thoughts and  feelings of hopelessness and worthlessness. He denies any suicidal ideations.  Patient last was seen via virtual visit about two weeks ago. He reports no change in symptoms since last session.  He reports antidepressant is causing more fatigue.  He will discuss more with psychiatrist Dr. Modesta Messing during his next medication management appointment.  He continues to have negative thoughts about self regarding losing his job and not being able to provide for his family financially as he has in the past.  He reports depressed mood, memory difficulty, poor concentration, poor motivation, loss of interest in activities, isolative behaviors, and loss of libido. He continues to report thoughts and feelings of hopelessness and worthlessness but reports no suicidal ideations.   Suicidal/Homicidal: Nowithout intent/plan  Therapist Response:  reviewed symptoms, continued orienting patient to CBT, discussed the vicious cycle of depression and ways to intervene with behavioral activation, discussed patient's value regarding contributing and being supportive of his wife, assisted patient identify activities that reflect this value, assisted patient develop plan to do the dishes on Mondays and Thursdays, assisted patient identify thoughts and processes that may inhibit implementation of plan, used cognitive defusion to assist patient cope with ruminating thoughts   Plan: Return again in 2 weeks.  Diagnosis: Axis I: MDD, single episode, moderate    Axis II: No diagnosis    [name], LCSW 03/01/2019

## 2019-03-03 ENCOUNTER — Ambulatory Visit (HOSPITAL_COMMUNITY): Payer: 59

## 2019-03-04 ENCOUNTER — Ambulatory Visit (INDEPENDENT_AMBULATORY_CARE_PROVIDER_SITE_OTHER): Payer: 59 | Admitting: Psychiatry

## 2019-03-04 ENCOUNTER — Other Ambulatory Visit: Payer: Self-pay

## 2019-03-04 ENCOUNTER — Encounter (HOSPITAL_COMMUNITY): Payer: Self-pay | Admitting: Psychiatry

## 2019-03-04 DIAGNOSIS — F322 Major depressive disorder, single episode, severe without psychotic features: Secondary | ICD-10-CM | POA: Diagnosis not present

## 2019-03-04 MED ORDER — LORAZEPAM 0.5 MG PO TABS: 0.5000 mg | ORAL_TABLET | Freq: Two times a day (BID) | ORAL | 0 refills | Status: DC | PRN

## 2019-03-04 MED ORDER — ARIPIPRAZOLE 2 MG PO TABS: 2.0000 mg | ORAL_TABLET | Freq: Every day | ORAL | 0 refills | Status: DC

## 2019-03-04 MED FILL — LORazepam 0.5 MG TABS: 0.5 | 30 days supply | Qty: 60 | Fill #0

## 2019-03-04 MED FILL — ARIPiprazole 2 MG TABS: 2 | 30 days supply | Qty: 30 | Fill #0

## 2019-03-04 NOTE — Patient Instructions (Signed)
1.Continuevenlafaxine 225mg  (150 mg + 75 mg)daily 2. Discontinue quetiapine 3. Start Abilify 2 mg daily  4. Discontinue Ambien 5. Start lorazepam 0.5 mg twice a day as needed for anxiety  6. Next appointment: 11/17 at 1:30

## 2019-03-12 ENCOUNTER — Encounter (HOSPITAL_COMMUNITY): Payer: 59

## 2019-03-15 ENCOUNTER — Ambulatory Visit (HOSPITAL_COMMUNITY): Payer: 59 | Admitting: Psychiatry

## 2019-03-21 DIAGNOSIS — H52223 Regular astigmatism, bilateral: Secondary | ICD-10-CM | POA: Diagnosis not present

## 2019-03-24 NOTE — Progress Notes (Signed)
Virtual Visit via Video Note  I connected with Gabriel [name] on 03/30/19 at  1:30 PM EST by a video enabled telemedicine application and verified that I am speaking with the correct person using two identifiers.   I discussed the limitations of evaluation and management by telemedicine and the availability of in person appointments. The patient expressed understanding and agreed to proceed.     I discussed the assessment and treatment plan with the patient. The patient was provided an opportunity to ask questions and all were answered. The patient agreed with the plan and demonstrated an understanding of the instructions.   The patient was advised to call back or seek an in-person evaluation if the symptoms worsen or if the condition fails to improve as anticipated.  I provided 25 minutes of non-face-to-face time during this encounter.   Gabriel Clay, MD    [facility] MD/PA/NP OP Progress Note  03/30/2019 2:04 PM [name]  MRN:  [id number]  Chief Complaint:  Chief Complaint    Follow-up; Depression     HPI:  This is a follow-up appointment for depression.  He states that he feels the same since the last visit. When he is inquired about interaction with his family (discussed at his last visit to spend more time with his family rather than isolating himself), he states that it has not changed as he states he feels better when he is by himself.  He sits in the chair in his room, doing nothing most of the time.  His mind wanders and has random thoughts of "things I could have differently" "what I shouldn't have said." Although he may talk with his family "as they are in the same house," he reports limited interaction. He has middle insomnia.  He feels fatigue.  He has difficulty in concentration.  He has anhedonia.  He has decreased appetite.  He denies SI.  He feels anxious and tense.  He denies panic attacks.  He has not done any physical activity. He has not noticed any difference  after taking lorazepam; he reports occasional episode of dizziness.   220 lbs Wt Readings from Last 3 Encounters:  12/29/18 210 lb (95.3 kg)  12/18/18 200 lb (90.7 kg)  10/28/18 218 lb 0.6 oz (98.9 kg)    Visit Diagnosis:    ICD-10-CM   1. Current severe episode of major depressive disorder without psychotic features without prior episode ([place])  F32.2     Past Psychiatric History: Please see initial evaluation for full details. I have reviewed the history. No updates at this time.     Past Medical History:  Past Medical History:  Diagnosis Date  . Carpal tunnel syndrome 2018  . Depression   . Heart murmur   . Seasonal allergies     Past Surgical History:  Procedure Laterality Date  . NO PAST SURGERIES      Family Psychiatric History: Please see initial evaluation for full details. I have reviewed the history. No updates at this time.     Family History:  Family History  Problem Relation Age of Onset  . Diabetes Father   . Hypertension Father   . Hypertension Mother   . Cancer Maternal Grandmother   . Cancer Maternal Grandfather   . Hypertension Maternal Grandfather   . Cancer Paternal Grandmother   . Hypertension Paternal Grandmother   . Cancer Paternal Grandfather   . Hypertension Paternal Grandfather     Social History:  Social History   Socioeconomic History  .  Marital status: Married    Spouse name: Not on file  . Number of children: 2  . Years of education: 23  . Highest education level: High school graduate  Occupational History  . Not on file  Social Needs  . Financial resource strain: Somewhat hard  . Food insecurity    Worry: Never true    Inability: Never true  . Transportation needs    Medical: No    Non-medical: No  Tobacco Use  . Smoking status: Never Smoker  . Smokeless tobacco: Never Used  Substance and Sexual Activity  . Alcohol use: No    Alcohol/week: 0.0 standard drinks  . Drug use: No  . Sexual activity: Yes    Birth  control/protection: None  Lifestyle  . Physical activity    Days per week: Not on file    Minutes per session: Not on file  . Stress: Very much  Relationships  . Social Musician on phone: Not on file    Gets together: Not on file    Attends religious service: Not on file    Active member of club or organization: Not on file    Attends meetings of clubs or organizations: Not on file    Relationship status: Not on file  Other Topics Concern  . Not on file  Social History Narrative   He lives with wife and two children.   Unemployed at this present time   Right handed   One story home   Highest level of education:  12th grade    Allergies: No Known Allergies  Metabolic Disorder Labs: Lab Results  Component Value Date   HGBA1C 6.4 (H) 12/29/2018   MPG 137 12/29/2018   MPG 123 12/04/2016   No results found for: PROLACTIN Lab Results  Component Value Date   CHOL 183 12/29/2018   TRIG 72 12/29/2018   HDL 39 (L) 12/29/2018   CHOLHDL 4.7 12/29/2018   VLDL 10 12/04/2016   LDLCALC 128 (H) 12/29/2018   LDLCALC 108 (H) 02/18/2018   Lab Results  Component Value Date   TSH 2.14 12/29/2018   TSH 2.11 07/16/2017    Therapeutic Level Labs: No results found for: LITHIUM No results found for: VALPROATE No components found for:  CBMZ  Current Medications: Current Outpatient Medications  Medication Sig Dispense Refill  . ARIPiprazole (ABILIFY) 5 MG tablet Take 1 tablet (5 mg total) by mouth at bedtime. 30 tablet 1  . hydrocortisone 2.5 % cream Apply 1 application topically as directed.    . loratadine (CLARITIN) 10 MG tablet Take 1 tablet (10 mg total) by mouth daily. 90 tablet 3  . mometasone (NASONEX) 50 MCG/ACT nasal spray Place 2 sprays into the nose daily. 34 g 2  . tretinoin (RETIN-A) 0.025 % cream Apply 1 application topically at bedtime.    Gabriel [name] ON 04/23/2019] venlafaxine XR (EFFEXOR-XR) 150 MG 24 hr capsule 225 mg daily (75 mg +150 mg ) 90 capsule 0   . [START ON 04/26/2019] venlafaxine XR (EFFEXOR-XR) 75 MG 24 hr capsule 225 mg daily (75 mg +150 mg ) 90 capsule 0   No current facility-administered medications for this visit.      Musculoskeletal: Strength & Muscle Tone: N/A Gait & Station: N/A Patient leans: N/A  Psychiatric Specialty Exam: Review of Systems  Psychiatric/Behavioral: Positive for depression. Negative for hallucinations, memory loss, substance abuse and suicidal ideas. The patient is nervous/anxious and has insomnia.   All other systems reviewed  and are negative.   There were no vitals taken for this visit.There is no height or weight on file to calculate BMI.  General Appearance: Fairly Groomed  Eye Contact:  Fair  Speech:  Clear and Coherent  Volume:  Normal  Mood:  Depressed  Affect:  Appropriate, Congruent and Restricted  Thought Process:  Coherent  Orientation:  Full (Time, Place, and Person)  Thought Content: Logical   Suicidal Thoughts:  No  Homicidal Thoughts:  No  Memory:  Immediate;   Good  Judgement:  Good  Insight:  Fair  Psychomotor Activity:  Normal  Concentration:  Concentration: Good and Attention Span: Good  Recall:  Good  Fund of Knowledge: Good  Language: Good  Akathisia:  No  Handed:  Right  AIMS (if indicated): not done  Assets:  Communication Skills Desire for Improvement  ADL's:  Intact  Cognition: WNL  Sleep:  Poor   Screenings: GAD-7     Office Visit from 12/29/2018 in [facility] Office Visit from 10/28/2018 in [facility] Office Visit from 01/28/2018 in [facility] Phone Follow Up from 01/08/2018 in [facility] Phone Follow Up from 12/04/2017 in [facility]  Total GAD-7 Score  PHQ2-9     [facility]sel[facility] from [facility] Office Visit from 12/29/2018 in [facility] Office Visit from 10/28/2018 in [facility] Office Visit from 08/12/2018 in [facility] Office Visit from 03/17/2018 in [facility]  PHQ-2 Total Score  PHQ-9 Total Score  Assessment and Plan:  Gabriel [name] is a 43 y.o. year old male with a history of depression, ,bilateral carpel tunnel syndrome , who presents for follow up appointment for Current severe episode of major depressive disorder without psychotic features without prior episode [facility])  # MDD, severe, single without psychotic features He continues to demonstrate impairment in inattention, and he continues to have depressive symptoms worse fatigue/anhedonia.  Psychosocial stressors includes unemployment, termination of job, which he perceives as racial discrimination.  Will uptitrate Abilify to optimize treatment for depression.  He is advised to take this medication at night to see if it improves fatigue (it was difficult to discern whether his fatigue is more attributable to lorazepam). May consider other adjunctive treatment (such as bupropion) if he has limited benefit from this uptitration.  Discussed potential metabolic side effect and EPS.  We will continue venlafaxine for depression.  Will discontinue lorazepam given dizziness and worsening in anxiety.  Spent significant time coaching behavioral activation and sleep hygiene.  He is encouraged to continue to see Ms. Bynum for therapy.   Plan 1.Continuevenlafaxine  (150 mg + 75 mg)daily 2. Increase Abilify 5 mg at night  3. Discontinue lorazepam 4. Next appointment in a month  Past trials of medication:sertraline, fluoxetine,mirtazapine, nortriptyline for carpel tunnel syndrome, lorazepam (worsening in anxiety, drowsiness), quetiapine (drowsiness), Trazodone, Ambien (limited benefit)  The patient demonstrates the following risk factors for suicide: Chronic risk factors for suicide include:psychiatric disorder ofdepression. Acute risk  factorsfor suicide include: unemployment and loss (financial, interpersonal, professional). Protective factorsfor this patient include: positive social support, responsibility to others (children, family) and hope for the future. Considering these factors, the overall suicide risk at this point appears to  below. Patientisappropriate for outpatient follow up.  The duration of this appointment visit was 25 minutes of non face-to-face time with the patient.  Greater than 50% of this time was spent in counseling, explanation of  diagnosis, planning of further management, and coordination of care.  Neysa Hottereina Keishaun Hazel, MD 03/30/2019, 2:04 PM

## 2019-03-30 ENCOUNTER — Encounter (HOSPITAL_COMMUNITY): Payer: Self-pay | Admitting: Psychiatry

## 2019-03-30 ENCOUNTER — Ambulatory Visit (INDEPENDENT_AMBULATORY_CARE_PROVIDER_SITE_OTHER): Payer: 59 | Admitting: Psychiatry

## 2019-03-30 ENCOUNTER — Other Ambulatory Visit: Payer: Self-pay

## 2019-03-30 DIAGNOSIS — F322 Major depressive disorder, single episode, severe without psychotic features: Secondary | ICD-10-CM | POA: Diagnosis not present

## 2019-03-30 MED ORDER — VENLAFAXINE HCL ER 150 MG PO CP24: ORAL_CAPSULE | ORAL | 0 refills | Status: DC

## 2019-03-30 MED ORDER — VENLAFAXINE HCL ER 75 MG PO CP24: ORAL_CAPSULE | ORAL | 0 refills | Status: DC

## 2019-03-30 MED ORDER — ARIPIPRAZOLE 5 MG PO TABS: 5.0000 mg | ORAL_TABLET | Freq: Every day | ORAL | 1 refills | Status: DC

## 2019-03-30 MED FILL — ARIPiprazole 5 MG TABS: 5 | 30 days supply | Qty: 30 | Fill #0

## 2019-03-30 NOTE — Patient Instructions (Signed)
1.Continuevenlafaxine 225mg  (150 mg + 75 mg)daily 2. Increase Abilify 5 mg at night  3. Discontinue lorazepam 4. Next appointment in a month

## 2019-03-31 ENCOUNTER — Ambulatory Visit (HOSPITAL_COMMUNITY): Payer: 59 | Admitting: Psychiatry

## 2019-03-31 IMAGING — DX DG CERVICAL SPINE COMPLETE 4+V
5 series · 5 of 5 positions shown · non-contrast
Comparison: MR 03/09/2016

CLINICAL DATA: 41-year-old male with a history of neck pain.
Traction injury of the upper extremity 3 months ago.

EXAM:
CERVICAL SPINE - COMPLETE 4+ VIEW

[c-spine lat]
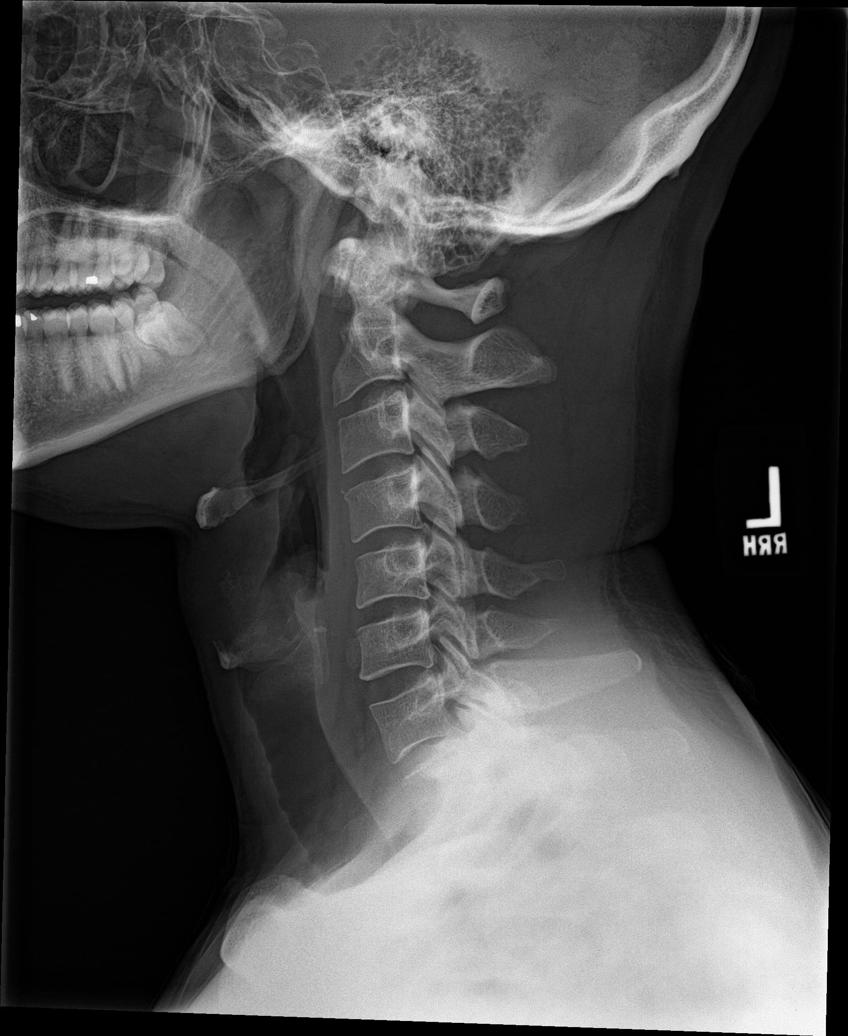

[c-spine obl (1 of 2)]
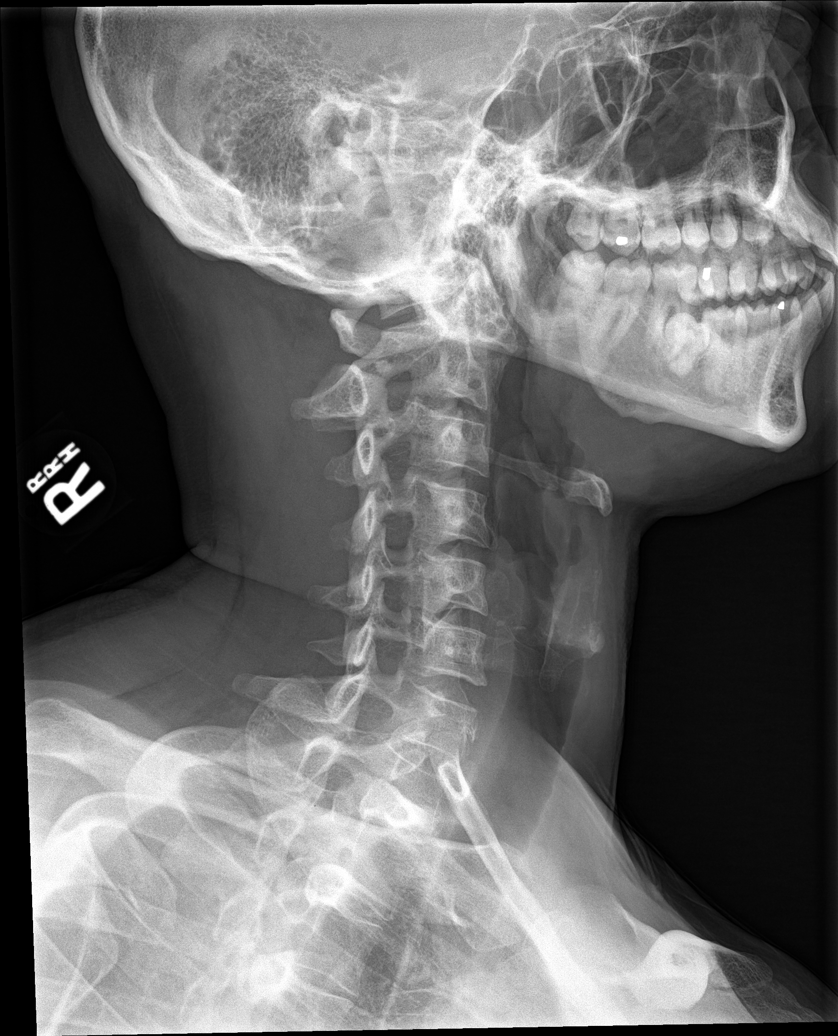

[c-spine obl (2 of 2)]
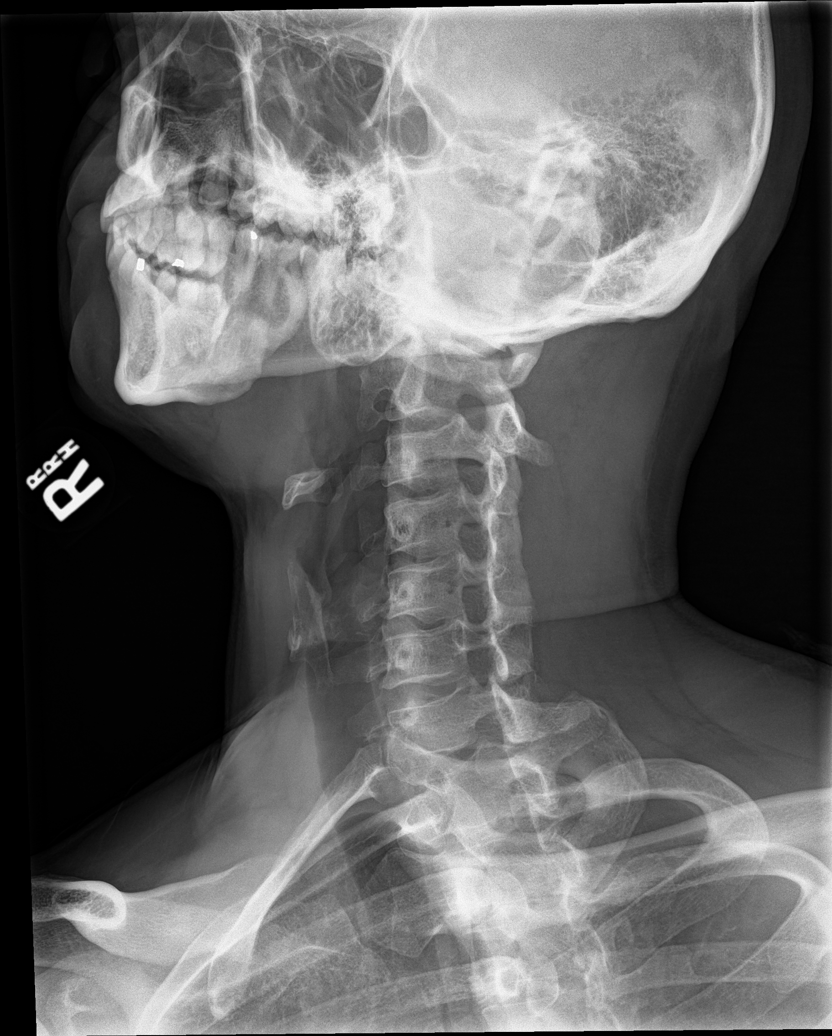

[c-spine ap]
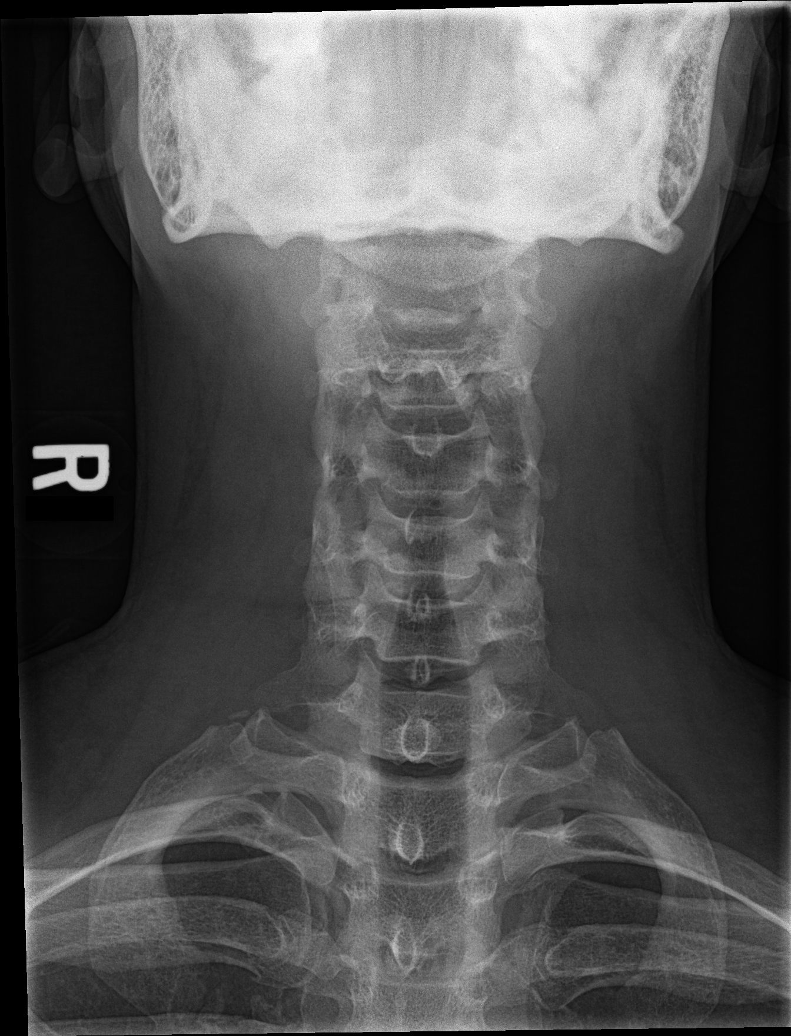

[c-spine open mouth]
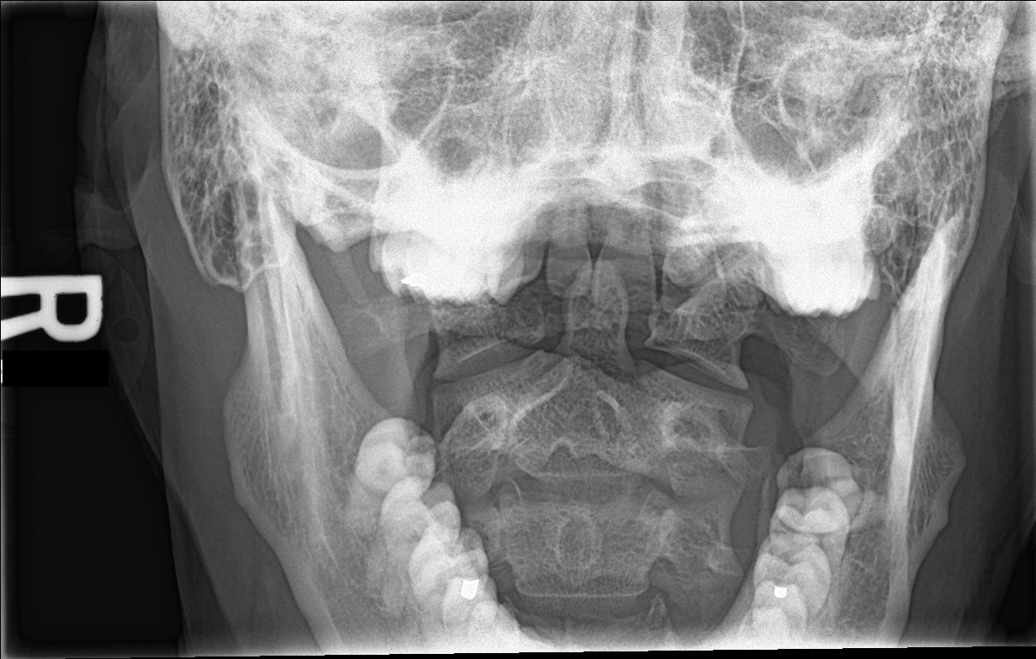

[5 of 5 positions shown; findings below may reference images not displayed]

FINDINGS: Cervical Spine:

Cervical elements from the level of the C1-T1 maintain alignment,
without subluxation, anterolisthesis, retrolisthesis.

No acute fracture line identified.

Unremarkable appearance of the craniocervical junction.

Vertebral body heights maintained as well as disc space heights.

No significant degenerative disc disease or endplate changes.

No significant facet disease.

Oblique images demonstrate no foraminal narrowing. Unremarkable open
mouth odontoid view.

Prevertebral soft tissues within normal limits.
IMPRESSION: Negative for acute fracture or malalignment of the cervical spine.

No significant degenerative changes with no evidence of foraminal
narrowing.

## 2019-04-01 ENCOUNTER — Other Ambulatory Visit: Payer: Self-pay

## 2019-04-01 ENCOUNTER — Ambulatory Visit (INDEPENDENT_AMBULATORY_CARE_PROVIDER_SITE_OTHER): Payer: 59 | Admitting: Psychiatry

## 2019-04-01 DIAGNOSIS — F322 Major depressive disorder, single episode, severe without psychotic features: Secondary | ICD-10-CM | POA: Diagnosis not present

## 2019-04-01 NOTE — Progress Notes (Signed)
Virtual Visit via Video Note  I connected with Gabriel [name] on 04/01/19 at 2:10 PM EST by a video enabled telemedicine application and verified that I am speaking with the correct person using two identifiers.   I discussed the limitations of evaluation and management by telemedicine and the availability of in person appointments. The patient expressed understanding and agreed to proceed.  I provided  45 minutes of non-face-to-face time during this encounter.   [name], LCSW              THERAPIST PROGRESS NOTE  Session Time:  Thursday 04/01/2019 2:10 PM   Participation Level: Active  Behavioral Response: AlertDepressed  Type of Therapy: Individual Therapy  Treatment Goals addressed:  alleviate depressive symptoms and resume normal involvement and activity and social activity, learn and implement cognitive and behavioral strategies to overcome depression,   Interventions: CBT and Supportive  Summary: Gabriel [name] is a 43 y.o. male who is referred for services by PCP Dr. Tula Nakayama due to patient experiencing symptoms of depression. He denies any psychiatric hospitalizations, He saw therapist Youlanda Roys in [place] for a few months. Patient reports symptoms began when he lost his job about a year ago. Per his report, he was fired after he complained about race discrimination. 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 He states now feeling as though he failed his family. He states not wanting to be around anyone and having difficulty engaging with his wife and children. He fears losing his family.  He reports ruminating thoughts about job and the past, excessive worry depressed mood, tearfulness, anxiety, isolative behaviors, poor motivation, poor concentration, irritability, sleep difficulty, thoughts and feelings of hopelessness and  worthlessness. He denies any suicidal ideations.  Patient last was seen via virtual visit about a month ago. He reports no change in symptoms since last session.  He reports continued fatigue.  He reports little to no involvement in activity.  He states mainly staying in his pajamas in bed most of the day.  He continues to have negative thoughts about self and ruminates about losing his job.  He reports continued memory difficulty, poor concentration, poor motivation, loss of interest in activities, isolated behaviors, and loss of libido.  He reports he did not follow through on plan developed in last session as he has just been too tired.  Marland Kitchen He continues to report thoughts and feelings of hopelessness and worthlessness but denies suicidal ideations.   Suicidal/Homicidal: Nowithout intent/plan  Therapist Response:  reviewed symptoms, facilitated expression of thoughts and feelings, assisted patient identify ways to increase behavioral activation consistent with his value, developed plan with patient to avoid being in his bedroom during the day and go to the living room or den during the day, developed plan with patient to play catch with his 2 sons, assisted patient identify and address thoughts and processes that may inhibit implementation of plan, used cognitive defusion   Plan: Return again in 1 week  Diagnosis: Axis I: MDD, single episode, moderate    Axis II: No diagnosis    [name], LCSW 04/01/2019

## 2019-04-02 ENCOUNTER — Ambulatory Visit (INDEPENDENT_AMBULATORY_CARE_PROVIDER_SITE_OTHER): Payer: 59 | Admitting: Neurology

## 2019-04-02 ENCOUNTER — Other Ambulatory Visit: Payer: Self-pay

## 2019-04-02 ENCOUNTER — Telehealth: Payer: Self-pay | Admitting: Neurology

## 2019-04-02 ENCOUNTER — Encounter: Payer: Self-pay | Admitting: Neurology

## 2019-04-02 DIAGNOSIS — G5603 Carpal tunnel syndrome, bilateral upper limbs: Secondary | ICD-10-CM

## 2019-04-02 DIAGNOSIS — R200 Anesthesia of skin: Secondary | ICD-10-CM | POA: Diagnosis not present

## 2019-04-02 MED ORDER — GABAPENTIN 300 MG PO CAPS: ORAL_CAPSULE | ORAL | 3 refills | Status: DC

## 2019-04-02 MED FILL — GABAPENTIN 300 MG CAPSULE: 300 | 30 days supply | Qty: 60 | Fill #0

## 2019-04-02 NOTE — Patient Instructions (Addendum)
MRI lumbar spine without contrast.  We will call you with the results. We have sent a referral to [facility] for your MRI and they will call you directly to schedule your appointment. They are located at [place]. If you need to contact them directly please call [phone].  Start gabapentin 300mg  at bedtime for one week, then increase to 1 tablet twice daily  Refer to [facility] for bilateral carpal tunnel syndrome

## 2019-04-02 NOTE — Telephone Encounter (Signed)
Sent referral and notes to [facility], Dr. Gladstone Lighter

## 2019-04-02 NOTE — Telephone Encounter (Signed)
Patient is calling after hours about a referral and wanting to ask about the location it was sent to. Thanks!

## 2019-04-02 NOTE — Progress Notes (Signed)
Follow-up Visit   Date: 04/02/19    Gabriel [name] MRN: [id number] DOB: 02-Jun-1975   Interim History: Gabriel [name] is a 43 y.o. right-handed African American male with bilateral carpal tunnel syndrome, severe depression/ansiety returning for evaluation of right leg numbness.  History of present illness: In October 2017, he developed left arm numbness and tingling involving the upper arm and forearm which started when trying to pull a dolly at work.  He went to the ER where CT cervical spine did not show any structural pathology.  He established care with Dr. Netta Corrigan and had MRI brachial plexus which returned normal. MRI of the cervical spine showed mild degenerative changes, no nerve impingement.  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    He returned to work in January 2018 and continues to have numbness/tingling of the right arm, but then developed similar numbness/tingling of the left arm, forearm, and hand.    Around the fall of 2018, he began having spells of numbness/tingling of the right thigh, leg, and foot lasting 5-10 min.  No associated weakness or falls.   For these symptoms, he underwent extensive testing for arm weakness including MRI brain (normal), MRI cervical spine (mild degenerative changes) and NCS/EMG which showed bilateral CTS.  He completed occupational therapy which significantly improved his arm weakness and hand strength.   UPDATE 04/02/2019:  He is here for follow-up.  He reports that his hands continue to fall asleep, despite trying to use wrist splints.  He is also very concerned about new numbness over the right thigh and tingling in the right toes.  He has completed PT with no improvement. There is no weakness in the leg, but he tends to guard the leg when walking.       Medications:  Current Outpatient Medications on File Prior to Visit  Medication Sig Dispense Refill  . ARIPiprazole (ABILIFY) 5 MG tablet Take 1 tablet (5  mg total) by mouth at bedtime. 30 tablet 1  . loratadine (CLARITIN) 10 MG tablet Take 1 tablet (10 mg total) by mouth daily. 90 tablet 3  . mometasone (NASONEX) 50 MCG/ACT nasal spray Place 2 sprays into the nose daily. 34 g 2  . tretinoin (RETIN-A) 0.025 % cream Apply 1 application topically at bedtime.    [name] ON 04/23/2019] venlafaxine XR (EFFEXOR-XR) 150 MG 24 hr capsule 225 mg daily (75 mg +150 mg ) 90 capsule 0  . [START ON 04/26/2019] venlafaxine XR (EFFEXOR-XR) 75 MG 24 hr capsule 225 mg daily (75 mg +150 mg ) 90 capsule 0  . hydrocortisone 2.5 % cream Apply 1 application topically as directed.     No current facility-administered medications on file prior to visit.     Allergies: No Known Allergies  Review of Systems:  CONSTITUTIONAL: No fevers, chills, night sweats, or weight loss.  EYES: No visual changes or eye pain ENT: No hearing changes.  No history of nose bleeds.   RESPIRATORY: No cough, wheezing and shortness of breath.   CARDIOVASCULAR: Negative for chest pain, and palpitations.   GI: Negative for abdominal discomfort, blood in stools or black stools.  No recent change in bowel habits.   GU:  No history of incontinence.   MUSCLOSKELETAL: No history of joint pain or swelling.  No myalgias.   SKIN: Negative for lesions, rash, and itching.   ENDOCRINE: Negative for cold or heat intolerance, polydipsia or goiter.   PSYCH:  +depression +anxiety symptoms.   NEURO: As  Above.   Vital Signs:  There were no vitals taken for this visit.  General Medical Exam:   General:  Anxious-appearing, comfortable  Eyes/ENT: see cranial nerve examination.   Neck:   No carotid bruits. Respiratory:  Clear to auscultation, good air entry bilaterally.   Cardiac:  Regular rate and rhythm, no murmur.   Ext:  No edema or deformity  Neurological Exam: MENTAL STATUS including orientation to time, place, person, recent and remote memory, attention span and concentration, language, and  fund of knowledge is normal.  Speech is not dysarthric.  CRANIAL NERVES: No visual field defects.  Pupils equal round and reactive to light.  Normal conjugate, extra-ocular eye movements in all directions of gaze.  No ptosis.  MOTOR:  There is give-way weakness through during motor strength testing.  All muscles groups are antigravity and able to resist.  There is no focal atrophy or abnormal movements.  No pronator drift.  Tone is normal.    MSRs:  Reflexes are 2+/4 throughout. Plantar responses are downgoing. No ankle clonus.   SENSORY:  Intact to temperature and vibration.  COORDINATION/GAIT:   Antalgic gait, unassisted and stable.  Data: MRI head: 1. Normal MRI of the head with and without contrast.  MRI cervical spine: 1. Early degenerative change of the cervical spine. No fracture, malalignment, acute osseous process or abnormal enhancement.  2. No canal stenosis. Mild RIGHT C5-6 and LEFT C6-7 neural foraminal narrowing.  NCS/EMG of the arms 07/22/2017: 1. Bilateral median neuropathy at or distal to the wrist, consistent with clinical diagnosis of carpal tunnel syndrome. Overall, these findings are moderate in degree electrically. 2. 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 3. Incidentally, there is a Martin-Gruber anastomosis bilaterally, normal variant.  IMPRESSION/PLAN: 1. Right leg pain and numbness.  No improvement with PT  - MRI lumbar spine wo contrast  - Start gabapentin 300mg  at bedtime x 1 week, then increase to 1 tab BID.  Side effects discussed  2.  Bilateral carpal tunnel syndrome, moderate.  No improvement with wrist splints  - Refer to Dr. Gladstone Lighter at [facility] for consideration of steroid injection or CTS release  3.  Severe depression/anxeity which is affecting his overall sense of well-being.  He is followed by psychiatry.  Greater than 50% of this 25 minute visit was spent in counseling, explanation of  diagnosis, planning of further management, and coordination of care.   Thank you for allowing me to participate in patient's care.  If I can answer any additional questions, I would be pleased to do so.    Sincerely,    Daxton Nydam K. Posey Pronto, DO

## 2019-04-06 DIAGNOSIS — M79641 Pain in right hand: Secondary | ICD-10-CM | POA: Diagnosis not present

## 2019-04-06 DIAGNOSIS — M79642 Pain in left hand: Secondary | ICD-10-CM | POA: Diagnosis not present

## 2019-04-16 ENCOUNTER — Other Ambulatory Visit: Payer: Self-pay

## 2019-04-16 ENCOUNTER — Ambulatory Visit (INDEPENDENT_AMBULATORY_CARE_PROVIDER_SITE_OTHER): Payer: 59 | Admitting: Psychiatry

## 2019-04-16 DIAGNOSIS — F322 Major depressive disorder, single episode, severe without psychotic features: Secondary | ICD-10-CM | POA: Diagnosis not present

## 2019-04-16 NOTE — Progress Notes (Signed)
Virtual Visit via Video Note  I connected with Gabriel [name] on 04/16/19 at 10:00 AM EST by a video enabled telemedicine application and verified that I am speaking with the correct person using two identifiers.   I discussed the limitations of evaluation and management by telemedicine and the availability of in person appointments. The patient expressed understanding and agreed to proceed.  I provided 45 minutes of non-face-to-face time during this encounter.   [name], LCSW             THERAPIST PROGRESS NOTE  Session Time:  Friday 04/16/2019 10:00 AM - 10:45 PM   Participation Level: Active  Behavioral Response: AlertDepressed  Type of Therapy: Individual Therapy  Treatment Goals addressed:  alleviate depressive symptoms and resume normal involvement and activity and social activity, learn and implement cognitive and behavioral strategies to overcome depression,   Interventions: CBT and Supportive  Summary: Gabriel [name] is a 43 y.o. male who is referred for services by PCP Dr. Syliva Overman due to patient experiencing symptoms of depression. He denies any psychiatric hospitalizations, He saw therapist Dorann Lodge in [place] for a few months. Patient reports symptoms began when he lost his job about a year ago. Per his report, he was fired after he complained about race discrimination. 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 He states now feeling as though he failed his family. He states not wanting to be around anyone and having difficulty engaging with his wife and children. He fears losing his family.  He reports ruminating thoughts about job and the past, excessive worry depressed mood, tearfulness, anxiety, isolative behaviors, poor motivation, poor concentration, irritability, sleep difficulty, thoughts and feelings of hopelessness  and worthlessness. He denies any suicidal ideations.  Patient last was seen via virtual visit about 2 weeks  ago. He reports continued symptoms of depression.  However, he reports initiating interaction with his children and watching them play video games after last session.  He states he did not feel any differently but says his children seemed to enjoy it.  He reports increased appetite and eating excessively on Thanksgiving.  Now, he says he has reduced his eating and now is eating 3 meals per day.  He also states being up and moving around a little bit more.  He reports staying out of his bedroom and being in the den instead about 2 hours/day. He continues to have negative thoughts about self and ruminates about losing his job.  He continues to report thoughts and feelings of hopelessness and worthlessness but denies suicidal ideations.   Suicidal/Homicidal: Nowithout intent/plan  Therapist Response:  reviewed symptoms, praised and reinforced patient initiating interaction with his children/improving eating patterns/and staying out of his bedroom more, discussed effects, assisted patient identify ways to increase engagement in these behaviors, developed plan with patient to initiate interaction with his children 2 times by next session, continue to eat 3 meals per day, and to stay out of his bedroom and going to the DN during the day for 4 hours, introduced and began to orient patient to ACT as a treatment modality, therapist will send patient handouts on the ACT matrix in preparation for next session   Plan: Return again in 1 week  Diagnosis: Axis I: MDD, single episode, moderate    Axis II: No diagnosis  [name], LCSW 04/16/2019

## 2019-04-19 DIAGNOSIS — G5603 Carpal tunnel syndrome, bilateral upper limbs: Secondary | ICD-10-CM | POA: Diagnosis not present

## 2019-04-23 ENCOUNTER — Other Ambulatory Visit: Payer: Self-pay

## 2019-04-23 ENCOUNTER — Ambulatory Visit (INDEPENDENT_AMBULATORY_CARE_PROVIDER_SITE_OTHER): Payer: 59 | Admitting: Psychiatry

## 2019-04-23 DIAGNOSIS — F322 Major depressive disorder, single episode, severe without psychotic features: Secondary | ICD-10-CM

## 2019-04-23 NOTE — Progress Notes (Signed)
Virtual Visit via Video Note  I connected with Gabriel [name] on 04/23/19 at 11:00 AM EST by a video enabled telemedicine application and verified that I am speaking with the correct person using two identifiers.   I discussed the limitations of evaluation and management by telemedicine and the availability of in person appointments. The patient expressed understanding and agreed to proceed.  I provided 45 minutes of non-face-to-face time during this encounter.   [name], LCSW   THERAPIST PROGRESS NOTE  Session Time:  Friday 04/23/2019 11:00 AM - 11:45 AM   Participation Level: Active  Behavioral Response: AlertDepressed/tearful at times  Type of Therapy: Individual Therapy  Treatment Goals addressed:  alleviate depressive symptoms and resume normal involvement and activity and social activity, learn and implement cognitive and behavioral strategies to overcome depression,   Interventions: CBT and Supportive  Summary: Gabriel [name] is a 43 y.o. male who is referred for services by PCP Dr. Tula Nakayama due to patient experiencing symptoms of depression. He denies any psychiatric hospitalizations, He saw therapist Youlanda Roys in [place] for a few months. Patient reports symptoms began when he lost his job about a year ago. Per his report, he was fired after he complained about race discrimination. 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 He states now feeling as though he failed his family. He states not wanting to be around anyone and having difficulty engaging with his wife and children. He fears losing his family.  He reports ruminating thoughts about job and the past, excessive worry depressed mood, tearfulness, anxiety, isolative behaviors, poor motivation, poor concentration, irritability, sleep difficulty, thoughts and feelings of  hopelessness and worthlessness. He denies any suicidal ideations.  Patient last was seen via virtual visit about 1week ago. He reports continued symptoms of depression. He continues to report thoughts and feelings of hopelessness and worthlessness but denies suicidal ideations.  He says he has been staying out of his bedroom and going into the den daily from 1 to 2 hours/day.  He reports he did not initiate interaction with his children but he has been around them more.  He reports decreased appetite and continued sleep difficulty stating he sleeps about 3 to 4 hours per night.  He reports sleep difficulty due to ruminating thoughts and pain related to carpal tunnel.  Patient reports he did not receive handouts in the mail.   Suicidal/Homicidal: Nowithout intent/plan  Therapist Response:  reviewed symptoms, praised and reinforced patient's efforts to stay out of his bedroom during the day and efforts to stay in the den around his children, developed plan with patient to to increase engagement in this behavior, assisted patient identify and address thoughts and processes that inhibited his efforts to initiate interaction with his children, began to assist patient to identify his values to help identify value congruent behavior.  Plan: Return again in 1 week  Diagnosis: Axis I: MDD, single episode, moderate    Axis II: No diagnosis    [name], LCSW 04/23/2019

## 2019-04-26 DIAGNOSIS — G8321 Monoplegia of upper limb affecting right dominant side: Secondary | ICD-10-CM | POA: Diagnosis not present

## 2019-04-26 DIAGNOSIS — M79642 Pain in left hand: Secondary | ICD-10-CM | POA: Diagnosis not present

## 2019-04-26 DIAGNOSIS — M79641 Pain in right hand: Secondary | ICD-10-CM | POA: Diagnosis not present

## 2019-04-26 DIAGNOSIS — G8324 Monoplegia of upper limb affecting left nondominant side: Secondary | ICD-10-CM | POA: Diagnosis not present

## 2019-04-26 MED FILL — predniSONE 10 MG TABS: 10 | 12 days supply | Qty: 21 | Fill #0

## 2019-04-29 ENCOUNTER — Ambulatory Visit (HOSPITAL_COMMUNITY): Payer: 59 | Admitting: Psychiatry

## 2019-04-30 ENCOUNTER — Other Ambulatory Visit: Payer: Self-pay

## 2019-04-30 ENCOUNTER — Ambulatory Visit (INDEPENDENT_AMBULATORY_CARE_PROVIDER_SITE_OTHER): Payer: 59 | Admitting: Psychiatry

## 2019-04-30 DIAGNOSIS — F322 Major depressive disorder, single episode, severe without psychotic features: Secondary | ICD-10-CM

## 2019-04-30 NOTE — Progress Notes (Signed)
Virtual Visit via Video Note  I connected with Gabriel [name] on 04/30/19 at 10:00 AM EST by a video enabled telemedicine application and verified that I am speaking with the correct person using two identifiers.   I discussed the limitations of evaluation and management by telemedicine and the availability of in person appointments. The patient expressed understanding and agreed to proceed.   I provided 50 minutes of non-face-to-face time during this encounter.   [name], LCSW   THERAPIST PROGRESS NOTE  Session Time:  Friday 04/30/2019 10:00 AM -  10:50 AM  Participation Level: Active  Behavioral Response: AlertDepressed/  Type of Therapy: Individual Therapy  Treatment Goals addressed:  alleviate depressive symptoms and resume normal involvement and activity and social activity, learn and implement cognitive and behavioral strategies to overcome depression,   Interventions: CBT and Supportive  Summary: Gabriel [name] is a 43 y.o. male who is referred for services by PCP Dr. Tula Nakayama due to patient experiencing symptoms of depression. He denies any psychiatric hospitalizations, He saw therapist Youlanda Roys in [place] for a few months. Patient reports symptoms began when he lost his job about a year ago. Per his report, he was fired after he complained about race discrimination. 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 He states now feeling as though he failed his family. He states not wanting to be around anyone and having difficulty engaging with his wife and children. He fears losing his family.  He reports ruminating thoughts about job and the past, excessive worry depressed mood, tearfulness, anxiety, isolative behaviors, poor motivation, poor concentration, irritability, sleep difficulty, thoughts and feelings of hopelessness and  worthlessness. He denies any suicidal ideations.  Patient last was seen via virtual visit about 1week ago. He reports continued symptoms of depression. He continues to report thoughts and feelings of hopelessness and worthlessness but denies suicidal ideations.  He says he stayed out of his bedroom and went into the DN 1 to 2 days this past week.  He still has not been initiating interaction with his children.  He reports continued sleep difficulty, poor appetite, poor motivation, lack of interest in activities, depressed mood, and ruminating thoughts.  Patient reports being medication compliant with his medication does not seem to be helpful.  Suicidal/Homicidal: Nowithout intent/plan  Therapist Response:  reviewed symptoms, provided psychoeducation regarding depression and self-care, encourage patient to talk with psychiatrist regarding medication, discussed rationale for and offered patient referral to IOP, patient declines as he reports he does not like groups, continued to work with patient to identify values, began to provide psychoeducation regarding ACT and assisted patient identify" away moves" and "toward moves",   Plan: Return again in 1 week  Diagnosis: Axis I: MDD, single episode, moderate    Axis II: No diagnosis    [name], LCSW 04/30/2019

## 2019-05-01 ENCOUNTER — Other Ambulatory Visit: Payer: Self-pay

## 2019-05-01 ENCOUNTER — Ambulatory Visit
Admission: RE | Admit: 2019-05-01 | Discharge: 2019-05-01 | Disposition: A | Discharge status: Home / Self Care | Payer: 59 | Source: Ambulatory Visit | Attending: Neurology | Admitting: Neurology

## 2019-05-01 ENCOUNTER — Other Ambulatory Visit: Payer: 59

## 2019-05-01 DIAGNOSIS — R200 Anesthesia of skin: Secondary | ICD-10-CM

## 2019-05-01 DIAGNOSIS — M79604 Pain in right leg: Secondary | ICD-10-CM | POA: Diagnosis not present

## 2019-05-12 ENCOUNTER — Ambulatory Visit (INDEPENDENT_AMBULATORY_CARE_PROVIDER_SITE_OTHER): Payer: 59 | Admitting: Psychiatry

## 2019-05-12 ENCOUNTER — Encounter: Payer: Self-pay | Admitting: Psychiatry

## 2019-05-12 ENCOUNTER — Other Ambulatory Visit: Payer: Self-pay

## 2019-05-12 DIAGNOSIS — F419 Anxiety disorder, unspecified: Secondary | ICD-10-CM

## 2019-05-12 DIAGNOSIS — F321 Major depressive disorder, single episode, moderate: Secondary | ICD-10-CM

## 2019-05-12 MED ORDER — TRAZODONE HCL 100 MG PO TABS: 100.0000 mg | ORAL_TABLET | Freq: Every day | ORAL | 0 refills | Status: DC

## 2019-05-12 MED ORDER — VENLAFAXINE HCL ER 150 MG PO CP24: ORAL_CAPSULE | ORAL | 0 refills | Status: DC

## 2019-05-12 MED ORDER — VENLAFAXINE HCL ER 75 MG PO CP24: ORAL_CAPSULE | ORAL | 0 refills | Status: DC

## 2019-05-12 MED ORDER — BUPROPION HCL ER (XL) 150 MG PO TB24: 150.0000 mg | ORAL_TABLET | ORAL | 0 refills | Status: DC

## 2019-05-12 MED FILL — traZODone HCL 100 MG TABS: 100 | 90 days supply | Qty: 90 | Fill #0

## 2019-05-12 MED FILL — buPROPion HCL ER (XL) 150 M: 150 | 30 days supply | Qty: 30 | Fill #0

## 2019-05-12 MED FILL — VENLAFAXINE HCL ER 75 MG CA: 75 | 90 days supply | Qty: 90 | Fill #0

## 2019-05-12 MED FILL — VENLAFAXINE HCL ER 150 MG C: 150 | 90 days supply | Qty: 90 | Fill #0

## 2019-05-12 NOTE — Progress Notes (Signed)
BH MD OP Progress Note  I connected with  Gabriel Settingerrence Borneman on 05/12/19 by a video enabled telemedicine application and verified that I am speaking with the correct person using two identifiers.   I discussed the limitations of evaluation and management by telemedicine. The patient expressed understanding and agreed to proceed.    05/12/2019 1:07 PM Gabriel [name]  MRN:  [id number]  Chief Complaint: " I am the same."  HPI: Patient reported that he continues to be isolative and stays by himself in his room.  He informed that he does not have a TV in his room and he never listens to music.  He stated he is place in his bed or sits on his chair thinking about things that happened in the past.  He stated he has a lot of thoughts in his mind.  When asked if he has any suicidal ideations he denied any.  He stated that he went out on dose of Abilify last time and he does not think that increasing the dose has helped him with anything.  He also thinks that it has made him feel off balance and has not helped his sleep at all.  He informed that he does not sleep much during the night and he never feels rested when he wakes up in the morning.  He is tired of feeling this way and does not know what else he can do to feel better.  He denied any hallucinations or paranoid ideations. He stated that he does not feel like he has any energy and he is very tired all the time.  He does not feel happy at all.  He barely interacts with his family.  He never eats 3 meals in a day and usually eats once or maybe twice.  He reported his appetite is very poor. This past medication list was reviewed and it was noted that he has been tried on low-dose of trazodone for sleep back in June 2020.  He has also taken temazepam for a few months in the past in 2019. EMR reviewed showed that he has never tried taking Wellbutrin in the past.   Visit Diagnosis:    ICD-10-CM   1. Current moderate episode of major depressive disorder  without prior episode (HCC)  F32.1   2. Anxiety  F41.9     Past Psychiatric History: Depression, anxiety  Past Medical History:  Past Medical History:  Diagnosis Date  . Carpal tunnel syndrome 2018  . Depression   . Heart murmur   . Seasonal allergies     Past Surgical History:  Procedure Laterality Date  . NO PAST SURGERIES      Family Psychiatric History: See below  Family History:  Family History  Problem Relation Age of [place]  . Diabetes Father   . Hypertension Father   . Hypertension Mother   . Cancer Maternal Grandmother   . Cancer Maternal Grandfather   . Hypertension Maternal Grandfather   . Cancer Paternal Grandmother   . Hypertension Paternal Grandmother   . Cancer Paternal Grandfather   . Hypertension Paternal Grandfather     Social History:  Social History   Socioeconomic History  . Marital status: Married    Spouse name: Not on file  . Number of children: 2  . Years of education: 8412  . Highest education level: High school graduate  Occupational History  . Occupation: not employed  Tobacco Use  . Smoking status: Never Smoker  . Smokeless tobacco: Never Used  Substance and Sexual Activity  . Alcohol use: No    Alcohol/week: 0.0 standard drinks  . Drug use: No  . Sexual activity: Yes    Birth control/protection: None  Other Topics Concern  . Not on file  Social History Narrative   He lives with wife and two children.   Unemployed at this present time   Right handed   One story home   Highest level of education:  12th grade   Social Determinants of Health   Financial Resource Strain:   . Difficulty of Paying Living Expenses: Not on file  Food Insecurity:   . Worried About Charity fundraiser in the Last Year: Not on file  . Ran Out of Food in the Last Year: Not on file  Transportation Needs:   . Lack of Transportation (Medical): Not on file  . Lack of Transportation (Non-Medical): Not on file  Physical Activity:   . Days of Exercise  per Week: Not on file  . Minutes of Exercise per Session: Not on file  Stress:   . Feeling of Stress : Not on file  Social Connections:   . Frequency of Communication with Friends and Family: Not on file  . Frequency of Social Gatherings with Friends and Family: Not on file  . Attends Religious Services: Not on file  . Active Member of Clubs or Organizations: Not on file  . Attends Archivist Meetings: Not on file  . Marital Status: Not on file    Allergies: No Known Allergies  Metabolic Disorder Labs: Lab Results  Component Value Date   HGBA1C 6.4 (H) 12/29/2018   MPG 137 12/29/2018   MPG 123 12/04/2016   No results found for: PROLACTIN Lab Results  Component Value Date   CHOL 183 12/29/2018   TRIG 72 12/29/2018   HDL 39 (L) 12/29/2018   CHOLHDL 4.7 12/29/2018   VLDL 10 12/04/2016   LDLCALC 128 (H) 12/29/2018   LDLCALC 108 (H) 02/18/2018   Lab Results  Component Value Date   TSH 2.14 12/29/2018   TSH 2.11 07/16/2017    Therapeutic Level Labs: No results found for: LITHIUM No results found for: VALPROATE No components found for:  CBMZ  Current Medications: Current Outpatient Medications  Medication Sig Dispense Refill  . ARIPiprazole (ABILIFY) 5 MG tablet Take 1 tablet (5 mg total) by mouth at bedtime. 30 tablet 1  . gabapentin (NEURONTIN) 300 MG capsule Take 1 tablet at bedtime for one week, then increase to 1 tablet twice daily. 60 capsule 3  . hydrocortisone 2.5 % cream Apply 1 application topically as directed.    . loratadine (CLARITIN) 10 MG tablet Take 1 tablet (10 mg total) by mouth daily. 90 tablet 3  . mometasone (NASONEX) 50 MCG/ACT nasal spray Place 2 sprays into the nose daily. 34 g 2  . tretinoin (RETIN-A) 0.025 % cream Apply 1 application topically at bedtime.    Marland Kitchen venlafaxine XR (EFFEXOR-XR) 150 MG 24 hr capsule 225 mg daily (75 mg +150 mg ) 90 capsule 0  . venlafaxine XR (EFFEXOR-XR) 75 MG 24 hr capsule 225 mg daily (75 mg +150 mg ) 90  capsule 0   No current facility-administered medications for this visit.     Musculoskeletal: Strength & Muscle Tone: unable to assess due to telemed visit Gait & Station: unable to assess due to telemed visit Patient leans: unable to assess due to telemed visit  Psychiatric Specialty Exam: Review of Systems  There were  no vitals taken for this visit.There is no height or weight on file to calculate BMI.  General Appearance: Disheveled  Eye Contact:  Fair  Speech:  Clear and Coherent and Normal Rate  Volume:  Normal  Mood:  Depressed and Dysphoric  Affect:  Depressed  Thought Process:  Goal Directed, Linear and Descriptions of Associations: Intact  Orientation:  Full (Time, Place, and Person)  Thought Content: Logical   Suicidal Thoughts:  No  Homicidal Thoughts:  No  Memory:  Recent;   Good Remote;   Good  Judgement:  Fair  Insight:  Fair  Psychomotor Activity:  Normal  Concentration:  Concentration: Good and Attention Span: Good  Recall:  Good  Fund of Knowledge: Good  Language: Good  Akathisia:  Negative  Handed:  Right  AIMS (if indicated): not done due to telemed visit  Assets:  Communication Skills Desire for Improvement Financial Resources/Insurance Housing  ADL's:  Intact  Cognition: WNL  Sleep:  Poor   Screenings: GAD-7     Office Visit from 12/29/2018 in [facility] Office Visit from 10/28/2018 in [facility] Office Visit from 01/28/2018 in [facility] Phone Follow Up from 01/08/2018 in [facility] Phone Follow Up from 12/04/2017 in [facility]  Total GAD-7 Score  21  20  15  10  15     PHQ2-9     Coun[facility] Office Visit from 12/29/2018 in [facility] Office Visit from 10/28/2018 in [facility] Office Visit from 08/12/2018 in [facility] Office Visit from 03/17/2018 in [facility]  PHQ-2 Total Score  6  6  3  6  6   PHQ-9 Total Score  22  21  18  14  22        Assessment and Plan: Patient is continuing to display depressive symptoms with anhedonia, poor sleep and poor appetite.  He did not find increasing the dose of Abilify to be beneficial.  He was offered Wellbutrin to augment the Effexor.  He was also offered retrial of trazodone as he has only tried low-dose in the past. Potential side effects of medication and risks vs benefits of treatment vs non-treatment were explained and discussed. All questions were answered.  1. Current moderate episode of major depressive disorder without prior episode (HCC)  - Continue venlafaxine XR (EFFEXOR-XR) 150 MG 24 hr capsule; 225 mg daily (75 mg +150 mg )  Dispense: 90 capsule; Refill: 0 - Continue venlafaxine XR (EFFEXOR-XR) 75 MG 24 hr capsule; 225 mg daily (75 mg +150 mg )  Dispense: 90 capsule; Refill: 0 - Start buPROPion (WELLBUTRIN XL) 150 MG 24 hr tablet; Take 1 tablet (150 mg total) by mouth every morning.  Dispense: 90 tablet; Refill: 0 - restart traZODone (DESYREL) 100 MG tablet; Take 1 tablet (100 mg total) by mouth at bedtime.  Dispense: 90 tablet; Refill: 0 - Discontinue Abilify due to lack of efficacy.  2. Anxiety  - venlafaxine XR (EFFEXOR-XR) 150 MG 24 hr capsule; 225 mg daily (75 mg +150 mg )  Dispense: 90 capsule; Refill: 0 - venlafaxine XR (EFFEXOR-XR) 75 MG 24 hr capsule; 225 mg daily (75 mg +150 mg )  Dispense: 90 capsule; Refill: 0  F/up in 6 weeks.  G[place], MD 05/12/2019, 1:07 PM

## 2019-05-17 DIAGNOSIS — M79642 Pain in left hand: Secondary | ICD-10-CM | POA: Diagnosis not present

## 2019-05-17 DIAGNOSIS — M79641 Pain in right hand: Secondary | ICD-10-CM | POA: Diagnosis not present

## 2019-05-17 DIAGNOSIS — G5603 Carpal tunnel syndrome, bilateral upper limbs: Secondary | ICD-10-CM | POA: Diagnosis not present

## 2019-05-18 ENCOUNTER — Other Ambulatory Visit: Payer: Self-pay

## 2019-05-18 ENCOUNTER — Ambulatory Visit (INDEPENDENT_AMBULATORY_CARE_PROVIDER_SITE_OTHER): Payer: 59 | Admitting: Psychiatry

## 2019-05-18 DIAGNOSIS — F322 Major depressive disorder, single episode, severe without psychotic features: Secondary | ICD-10-CM

## 2019-05-18 NOTE — Progress Notes (Signed)
Virtual Visit via Video Note  I connected with [name] on 05/18/19 at 9:10 AM ESTby a video enabled telemedicine application and verified that I am speaking with the correct person using two identifiers.   I discussed the limitations of evaluation and management by telemedicine and the availability of in person appointments. The patient expressed understanding and agreed to proceed.   I provided 50 minutes of non-face-to-face time during this encounter.   [name], LCSW   THERAPIST PROGRESS NOTE  Session Time:  Tuesday 05/18/2019 9:10 AM - 10:00 AM   Participation Level: Active  Behavioral Response: AlertDepressed/  Type of Therapy: Individual Therapy  Treatment Goals addressed:  alleviate depressive symptoms and resume normal involvement and activity and social activity, learn and implement cognitive and behavioral strategies to overcome depression,   Interventions: CBT and Supportive  Summary: Gabriel [name] is a 44 y.o. male who is referred for services by PCP Dr. Syliva Overman due to patient experiencing symptoms of depression. He denies any psychiatric hospitalizations, He saw therapist Dorann Lodge in [place] for a few months. Patient reports symptoms began when he lost his job about a year ago. Per his report, he was fired after he complained about race discrimination. 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 He states now feeling as though he failed his family. He states not wanting to be around anyone and having difficulty engaging with his wife and children. He fears losing his family.  He reports ruminating thoughts about job and the past, excessive worry depressed mood, tearfulness, anxiety, isolative behaviors, poor motivation, poor concentration, irritability, sleep difficulty, thoughts and feelings of hopelessness and  worthlessness. He denies any suicidal ideations.  Patient last was seen via virtual visit about 3 weeks ago. He reports no change in symptoms since last session. He continues to report thoughts and feelings of hopelessness and worthlessness but denies suicidal ideations.  He reports thoughts of being a failure because he cannot provide for his family financially.  He says he did watch his children open presents during Christmas.  He still has not been initiating interaction with his children.  He reports continued sleep difficulty, poor appetite, poor motivation, lack of interest in activities, depressed mood, and ruminating thoughts.  He reports seeing psychiatrist and being prescribed a different antidepressant.  He plans to start taking the medication today.  Suicidal/Homicidal: Nowithout intent/plan  Therapist Response:  reviewed symptoms, administered PHQ-9,  provided psychoeducation regarding depression and self-care, provided psychoeducation regarding ACT, continued to work with patient to identify values (being responsible, being sociable, and being supportive),  assisted patient identify" away moves" and "toward moves", assisted patient examine workability of his away moves, developed plan with patient to notice his away moves and toward moves between sessions  Plan: Return again in 1 week  Diagnosis: Axis I: MDD, single episode, moderate    Axis II: No diagnosis    [name], LCSW 05/18/2019

## 2019-05-26 ENCOUNTER — Ambulatory Visit (INDEPENDENT_AMBULATORY_CARE_PROVIDER_SITE_OTHER): Payer: 59 | Admitting: Psychiatry

## 2019-05-26 ENCOUNTER — Other Ambulatory Visit: Payer: Self-pay

## 2019-05-26 DIAGNOSIS — F322 Major depressive disorder, single episode, severe without psychotic features: Secondary | ICD-10-CM

## 2019-05-26 NOTE — Progress Notes (Signed)
Virtual Visit via Video Note  I connected with Gabriel [name] on 05/26/19 at 11:15 AM EST by a video enabled telemedicine application and verified that I am speaking with the correct person using two identifiers.   I discussed the limitations of evaluation and management by telemedicine and the availability of in person appointments. The patient expressed understanding and agreed to proceed.  I provided 45 minutes of non-face-to-face time during this encounter.   [name], LCSW    THERAPIST PROGRESS NOTE  Session Time:  Wednesday 05/26/2019 11:15 AM - 12:00 PM   Participation Level: Active  Behavioral Response: AlertDepressed/  Type of Therapy: Individual Therapy  Treatment Goals addressed:  alleviate depressive symptoms and resume normal involvement and activity and social activity, learn and implement cognitive and behavioral strategies to overcome depression,   Interventions: CBT and Supportive  Summary: Gabriel [name] is a 44 y.o. male who is referred for services by PCP Dr. Syliva Overman due to patient experiencing symptoms of depression. He denies any psychiatric hospitalizations, He saw therapist Dorann Lodge in [place] for a few months. Patient reports symptoms began when he lost his job about a year ago. Per his report, he was fired after he complained about race discrimination. 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 He states now feeling as though he failed his family. He states not wanting to be around anyone and having difficulty engaging with his wife and children. He fears losing his family.  He reports ruminating thoughts about job and the past, excessive worry depressed mood, tearfulness, anxiety, isolative behaviors, poor motivation, poor concentration, irritability, sleep difficulty, thoughts and feelings of hopelessness and  worthlessness. He denies any suicidal ideations.  Patient last was seen via virtual visit about 3 weeks ago. He reports no change in symptoms since last session. He continues to report thoughts and feelings of hopelessness and worthlessness but denies suicidal ideations.  He reports little to no involvement in activity. He reports continued thoughts of being a failure. He reports continued sleep difficulty, poor concentration, poor appetite, poor motivation, lack of interest in activities, depressed mood, and ruminating thoughts.  He reports he has started taking the medication prescribed at last medication management appointment. He reports taking medication consistently as his wife monitors this.  seeing psychiatrist and being prescribed a different antidepressant.  He plans to start taking the medication today. When asked about plan developed in last session, patient reports not noticing or making any observations about his behavior.   Suicidal/Homicidal: Nowithout intent/plan  Therapist Response:  reviewed symptoms,  assisted patient identify "away moves" and "toward moves",assisted patient identify activities consistent with his values, developed plan with patient for him to assist wife with washing dishes to reflect his value of support, used cognitive defusion to help patient cope with negative thoughts about self that could interfere with his efforts to implement plan, also developed plan with patient , developed plan with patient to notice and record away and toward moves between sessions.  Plan: Return again in 1-2  weeks  Diagnosis: Axis I: MDD, single episode, moderate    Axis II: No diagnosis    [name], LCSW 05/26/2019

## 2019-05-27 DIAGNOSIS — E663 Overweight: Secondary | ICD-10-CM | POA: Diagnosis not present

## 2019-05-27 DIAGNOSIS — Z13220 Encounter for screening for lipoid disorders: Secondary | ICD-10-CM | POA: Diagnosis not present

## 2019-05-27 DIAGNOSIS — R7302 Impaired glucose tolerance (oral): Secondary | ICD-10-CM | POA: Diagnosis not present

## 2019-05-27 DIAGNOSIS — R748 Abnormal levels of other serum enzymes: Secondary | ICD-10-CM | POA: Diagnosis not present

## 2019-05-28 ENCOUNTER — Encounter: Payer: Self-pay | Admitting: Family Medicine

## 2019-05-31 ENCOUNTER — Other Ambulatory Visit: Payer: Self-pay

## 2019-05-31 ENCOUNTER — Encounter: Payer: Self-pay | Admitting: Family Medicine

## 2019-05-31 ENCOUNTER — Telehealth: Payer: Self-pay | Admitting: *Deleted

## 2019-05-31 ENCOUNTER — Ambulatory Visit (INDEPENDENT_AMBULATORY_CARE_PROVIDER_SITE_OTHER): Payer: 59 | Admitting: Family Medicine

## 2019-05-31 VITALS — BP 112/80 | Ht 71.5 in | Wt 215.0 lb

## 2019-05-31 DIAGNOSIS — E785 Hyperlipidemia, unspecified: Secondary | ICD-10-CM

## 2019-05-31 DIAGNOSIS — F321 Major depressive disorder, single episode, moderate: Secondary | ICD-10-CM | POA: Diagnosis not present

## 2019-05-31 DIAGNOSIS — R7989 Other specified abnormal findings of blood chemistry: Secondary | ICD-10-CM

## 2019-05-31 DIAGNOSIS — M5416 Radiculopathy, lumbar region: Secondary | ICD-10-CM | POA: Diagnosis not present

## 2019-05-31 DIAGNOSIS — R7302 Impaired glucose tolerance (oral): Secondary | ICD-10-CM

## 2019-05-31 DIAGNOSIS — E669 Obesity, unspecified: Secondary | ICD-10-CM | POA: Diagnosis not present

## 2019-05-31 NOTE — Progress Notes (Signed)
Virtual Visit via Telephone Note  I connected with [name] on 05/31/19 at  1:00 PM EST by telephone and verified that I am speaking with the correct person using two identifiers.  Location: Patient: home Provider: office   I discussed the limitations, risks, security and privacy concerns of performing an evaluation and management service by telephone and the availability of in person appointments. I also discussed with the patient that there may be a patient responsible charge related to this service. The patient expressed understanding and agreed to proceed.   History of Present Illness: C/o ongoing disabling depression, not suicidal or homicidal C/o right lower extremity numbness which is worsening Very little exercise , no commitment to dietary change Denies recent fever or chills. Denies sinus pressure, nasal congestion, ear pain or sore throat. Denies chest congestion, productive cough or wheezing. Denies chest pains, palpitations and leg swelling Denies abdominal pain, nausea, vomiting,diarrhea or constipation.   Denies dysuria, frequency, hesitancy or incontinence. Denies joint pain, swelling and limitation in mobility. Denies skin break down or rash.       Observations/Objective: BP 112/80   Ht 5' 11.5" (1.816 m)   Wt 215 lb (97.5 kg)   BMI 29.57 kg/m   Good communication slight confusion and  Memory loss Alert and oriented x 3 No signs of respiratory distress during speech    Assessment and Plan:  Current moderate episode of major depressive disorder without prior episode (HCC) Unchanged and debilitating, patient unable to concentrate, focus and carry out any  Meaningful work Recommend seeking disability due to severe depression and wil also discuss with with wife  Right lumbar radiculopathy Symptomatic and worsening over time, refer fornerve testing  Obesity (BMI 30.0-34.9)  Patient re-educated about  the importance of commitment to a  minimum  of 150 minutes of exercise per week as able.  The importance of healthy food choices with portion control discussed, as well as eating regularly and within a 12 hour window most days. The need to choose "clean , green" food 50 to 75% of the time is discussed, as well as to make water the primary drink and set a goal of 64 ounces water daily.    Weight /BMI 06/08/2019 05/31/2019 12/29/2018  WEIGHT 221 lb 6 oz 215 lb 210 lb  HEIGHT 5' 11.5" 6\' 0"  6\' 0"   BMI 30.45 kg/m2 29.16 kg/m2 28.48 kg/m2  Some encounter information is confidential and restricted. Go to Review Flowsheets activity to see all data.      IGT (impaired glucose tolerance) Patient educated about the importance of limiting  Carbohydrate intake , the need to commit to daily physical activity for a minimum of 30 minutes , and to commit weight loss. The fact that changes in all these areas will reduce or eliminate all together the development of diabetes is stressed.   Diabetic Labs Latest Ref Rng & Units 06/08/2019 05/27/2019 12/29/2018 02/18/2018 12/04/2016  HbA1c <5.7 % of total Hgb - 6.4(H) 6.4(H) - 5.9(H)  Chol <200 mg/dL - 04/20/2018 12/06/2016 010 272  HDL > OR = 40 mg/dL - 44 536) 644) 56  Calc LDL mg/dL (calc) - 03(K) 74(Q) 108(H) 98  Triglycerides <150 mg/dL - 92 72 61 49  Creatinine 0.40 - 1.50 mg/dL 595(G 387(F 6.43 3.29 5.18  GFR >60.00 mL/min 87.22 - - - -   BP/Weight 06/08/2019 05/31/2019 12/29/2018 12/18/2018 10/28/2018 08/12/2018 03/17/2018  Systolic BP 114 112 112 - 110 - 120  Diastolic BP 80 80 80 - 70 -  80  Wt. (Lbs) 221.38 215 210 200 218.04 209 206.08  BMI 30.45 29.57 28.48 27.12 29.57 28.35 27.95  Some encounter information is confidential and restricted. Go to Review Flowsheets activity to see all data.   No flowsheet data found.  Unchanged  Dyslipidemia, goal LDL below 100 Hyperlipidemia:Low fat diet discussed and encouraged.   Lipid Panel  Lab Results  Component Value Date   CHOL 195 05/27/2019   HDL 44 05/27/2019    LDLCALC 131 (H) 05/27/2019   TRIG 92 05/27/2019   CHOLHDL 4.4 05/27/2019   Needs to reduce fat intake     Follow Up Instructions:    I discussed the assessment and treatment plan with the patient. The patient was provided an opportunity to ask questions and all were answered. The patient agreed with the plan and demonstrated an understanding of the instructions.   The patient was advised to call back or seek an in-person evaluation if the symptoms worsen or if the condition fails to improve as anticipated.  I provided 22 minutes of non-face-to-face time during this encounter.   Tula Nakayama, MD

## 2019-05-31 NOTE — Patient Instructions (Addendum)
F/U in 3. 5 months, in office, call if you need me before  Hepatitis panel is to be added , also please schedule [place] of liver.  I will message both Ms bynum and reach out to your wife as you requestged re looking into disability for you  You are referred to Dr Gerilyn Pilgrim for nerve conduction testing  [facility] you feel better  Please continue to reduce fried and fatty food intake as well as sugar and starchy foods  It is important that you exercise regularly at least 30 minutes 5 times a week. If you develop chest pain, have severe difficulty breathing, or feel very tired, stop exercising immediately and seek medical attention   Think about what you will eat, plan ahead. Choose " clean, green, fresh or frozen" over canned, processed or packaged foods which are more sugary, salty and fatty. 70 to 75% of food eaten should be vegetables and fruit. Three meals at set times with snacks allowed between meals, but they must be fruit or vegetables. Aim to eat over a 12 hour period , example 7 am to 7 pm, and STOP after  your last meal of the day. Drink water,generally about 64 ounces per day, no other drink is as healthy. Fruit juice is best enjoyed in a healthy way, by EATING the fruit. Thanks for choosing [facility], we consider it a privelige to serve you.

## 2019-05-31 NOTE — Telephone Encounter (Signed)
appt for [place] scheduled for [place] 06-04-19 at 9:15 am pt notified

## 2019-06-02 ENCOUNTER — Ambulatory Visit (HOSPITAL_COMMUNITY)
Admission: RE | Admit: 2019-06-02 | Discharge: 2019-06-02 | Disposition: A | Discharge status: Home / Self Care | Payer: 59 | Source: Ambulatory Visit | Attending: Family Medicine | Admitting: Family Medicine

## 2019-06-02 ENCOUNTER — Encounter: Payer: Self-pay | Admitting: Family Medicine

## 2019-06-02 ENCOUNTER — Other Ambulatory Visit: Payer: Self-pay

## 2019-06-02 DIAGNOSIS — R748 Abnormal levels of other serum enzymes: Secondary | ICD-10-CM | POA: Diagnosis not present

## 2019-06-02 DIAGNOSIS — R7989 Other specified abnormal findings of blood chemistry: Secondary | ICD-10-CM | POA: Diagnosis not present

## 2019-06-02 LAB — COMPLETE METABOLIC PANEL WITH GFR
AG Ratio: 1.7 (calc) (ref 1.0–2.5)
ALT: 122 U/L — ABNORMAL HIGH (ref 9–46)
AST: 96 U/L — ABNORMAL HIGH (ref 10–40)
Albumin: 4.5 g/dL (ref 3.6–5.1)
Alkaline phosphatase (APISO): 115 U/L (ref 36–130)
BUN: 18 mg/dL (ref 7–25)
CO2: 31 mmol/L (ref 20–32)
Calcium: 9.8 mg/dL (ref 8.6–10.3)
Chloride: 103 mmol/L (ref 98–110)
Creat: 1.14 mg/dL (ref 0.60–1.35)
GFR, Est African American: 91 mL/min/{1.73_m2} (ref >=60)
GFR, Est Non African American: 78 mL/min/{1.73_m2} (ref >=60)
Globulin: 2.6 g/dL (calc) (ref 1.9–3.7)
Glucose, Bld: 132 mg/dL — ABNORMAL HIGH (ref 65–99)
Potassium: 5 mmol/L (ref 3.5–5.3)
Sodium: 140 mmol/L (ref 135–146)
Total Bilirubin: 0.4 mg/dL (ref 0.2–1.2)
Total Protein: 7.1 g/dL (ref 6.1–8.1)

## 2019-06-02 LAB — HEPATITIS PANEL, ACUTE
Hep A IgM: NONREACTIVE
Hep B C IgM: NONREACTIVE
Hepatitis B Surface Ag: NONREACTIVE
Hepatitis C Ab: NONREACTIVE
SIGNAL TO CUT-OFF: 0.03 (ref <1.00)

## 2019-06-02 LAB — TEST AUTHORIZATION

## 2019-06-02 LAB — HEMOGLOBIN A1C
Hgb A1c MFr Bld: 6.4 % of total Hgb — ABNORMAL HIGH (ref <5.7)
Mean Plasma Glucose: 137 (calc)
eAG (mmol/L): 7.6 (calc)

## 2019-06-02 LAB — LIPID PANEL
Cholesterol: 195 mg/dL (ref <200)
HDL: 44 mg/dL (ref >=40)
LDL Cholesterol (Calc): 131 mg/dL (calc) — ABNORMAL HIGH
Non-HDL Cholesterol (Calc): 151 mg/dL (calc) — ABNORMAL HIGH (ref <130)
Total CHOL/HDL Ratio: 4.4 (calc) (ref <5.0)
Triglycerides: 92 mg/dL (ref <150)

## 2019-06-03 ENCOUNTER — Encounter: Payer: Self-pay | Admitting: Family Medicine

## 2019-06-03 ENCOUNTER — Other Ambulatory Visit: Payer: Self-pay | Admitting: Family Medicine

## 2019-06-03 DIAGNOSIS — R7989 Other specified abnormal findings of blood chemistry: Secondary | ICD-10-CM

## 2019-06-03 NOTE — Progress Notes (Signed)
amb gastro  

## 2019-06-04 ENCOUNTER — Ambulatory Visit (HOSPITAL_COMMUNITY): Payer: 59

## 2019-06-07 ENCOUNTER — Encounter: Payer: Self-pay | Admitting: *Deleted

## 2019-06-08 ENCOUNTER — Ambulatory Visit (INDEPENDENT_AMBULATORY_CARE_PROVIDER_SITE_OTHER): Payer: 59 | Admitting: Internal Medicine

## 2019-06-08 ENCOUNTER — Encounter: Payer: Self-pay | Admitting: Internal Medicine

## 2019-06-08 ENCOUNTER — Other Ambulatory Visit (INDEPENDENT_AMBULATORY_CARE_PROVIDER_SITE_OTHER): Payer: 59

## 2019-06-08 VITALS — BP 114/80 | HR 64 | Temp 98.2°F | Ht 71.5 in | Wt 221.4 lb

## 2019-06-08 DIAGNOSIS — R7989 Other specified abnormal findings of blood chemistry: Secondary | ICD-10-CM | POA: Diagnosis not present

## 2019-06-08 LAB — COMPREHENSIVE METABOLIC PANEL
ALT: 42 U/L (ref 0–53)
AST: 25 U/L (ref 0–37)
Albumin: 4.8 g/dL (ref 3.5–5.2)
Alkaline Phosphatase: 102 U/L (ref 39–117)
BUN: 12 mg/dL (ref 6–23)
CO2: 28 mEq/L (ref 19–32)
Calcium: 9.7 mg/dL (ref 8.4–10.5)
Chloride: 102 mEq/L (ref 96–112)
Creatinine, Ser: 1.11 mg/dL (ref 0.40–1.50)
GFR: 87.22 mL/min (ref >60.00)
Glucose, Bld: 106 mg/dL — ABNORMAL HIGH (ref 70–99)
Potassium: 3.6 mEq/L (ref 3.5–5.1)
Sodium: 138 mEq/L (ref 135–145)
Total Bilirubin: 0.5 mg/dL (ref 0.2–1.2)
Total Protein: 7.7 g/dL (ref 6.0–8.3)

## 2019-06-08 LAB — PROTIME-INR
INR: 1.1 ratio — ABNORMAL HIGH (ref 0.8–1.0)
Prothrombin Time: 12.5 s (ref 9.6–13.1)

## 2019-06-08 LAB — GAMMA GT: GGT: 66 U/L — ABNORMAL HIGH (ref 7–51)

## 2019-06-08 LAB — CBC WITH DIFFERENTIAL/PLATELET
Basophils Absolute: 0 10*3/uL (ref 0.0–0.1)
Basophils Relative: 0.8 % (ref 0.0–3.0)
Eosinophils Absolute: 0.3 10*3/uL (ref 0.0–0.7)
Eosinophils Relative: 5.7 % — ABNORMAL HIGH (ref 0.0–5.0)
HCT: 46.5 % (ref 39.0–52.0)
Hemoglobin: 15.7 g/dL (ref 13.0–17.0)
Lymphocytes Relative: 44.1 % (ref 12.0–46.0)
Lymphs Abs: 2.2 10*3/uL (ref 0.7–4.0)
MCHC: 33.7 g/dL (ref 30.0–36.0)
MCV: 87.8 fl (ref 78.0–100.0)
Monocytes Absolute: 0.3 10*3/uL (ref 0.1–1.0)
Monocytes Relative: 6.8 % (ref 3.0–12.0)
Neutro Abs: 2.1 10*3/uL (ref 1.4–7.7)
Neutrophils Relative %: 42.6 % — ABNORMAL LOW (ref 43.0–77.0)
Platelets: 373 10*3/uL (ref 150.0–400.0)
RBC: 5.3 Mil/uL (ref 4.22–5.81)
RDW: 13.2 % (ref 11.5–15.5)
WBC: 5 10*3/uL (ref 4.0–10.5)

## 2019-06-08 LAB — IGA: IgA: 171 mg/dL (ref 68–378)

## 2019-06-08 LAB — IBC + FERRITIN
Ferritin: 102.6 ng/mL (ref 22.0–322.0)
Iron: 101 ug/dL (ref 42–165)
Saturation Ratios: 26.4 % (ref 20.0–50.0)
Transferrin: 273 mg/dL (ref 212.0–360.0)

## 2019-06-08 LAB — C-REACTIVE PROTEIN: CRP: <1 mg/dL (ref 0.5–20.0)

## 2019-06-08 LAB — TSH: TSH: 1.93 u[IU]/mL (ref 0.35–4.50)

## 2019-06-08 NOTE — Progress Notes (Signed)
Patient ID: Shivansh Hardaway, male   DOB: 01-03-1976, 44 y.o.   MRN: [id number] HPI: Chad Donoghue is a 44 year old male with a past medical history of anxiety, depression, vitamin D deficiency, carpal tunnel syndrome and seasonal allergies who is seen in consultation at the request of Dr. Lodema Hong to evaluate elevated liver enzymes.  He is here alone today.  He reports that he was told his liver enzymes have been abnormal over the last year.  He denies any specific GI complaint.  No report of jaundice, dark urine, acholic stools, itching, ascites or lower extremity edema.  No abdominal pain.  No nausea or vomiting.  No reflux symptoms or dysphagia.  Normal bowel habits without diarrhea, constipation, blood in stool or melena.  He does not use alcohol tobacco or illicit drug use.  No prior tattooing.  No family history of liver disease.  He has gained 30 to 40 pounds in the last few years which she attributes to slightly less activity levels.  We reviewed his medications at length today.  He started bupropion within the last 2 to 3 weeks.  He takes trazodone for sleep for the last several months prior to this it was Ambien.  He has taken gabapentin for neuropathic pain since 2019.  He also has been on Effexor for multiple months unsure exactly when this started.  He is taking a multivitamin for years but no other over-the-counter supplements.  Past Medical History:  Diagnosis Date  . Anxiety   . Carpal tunnel syndrome 2018  . Depression   . Elevated LFTs   . Heart murmur   . Insomnia   . Seasonal allergies   . Vitamin D deficiency     Past Surgical History:  Procedure Laterality Date  . NO PAST SURGERIES      Outpatient Medications Prior to Visit  Medication Sig Dispense Refill  . buPROPion (WELLBUTRIN XL) 150 MG 24 hr tablet Take 1 tablet (150 mg total) by mouth every morning. 90 tablet 0  . gabapentin (NEURONTIN) 300 MG capsule Take 1 tablet at bedtime for one week, then increase to 1  tablet twice daily. 60 capsule 3  . hydrocortisone 2.5 % cream Apply 1 application topically as directed.    . loratadine (CLARITIN) 10 MG tablet Take 1 tablet (10 mg total) by mouth daily. 90 tablet 3  . mometasone (NASONEX) 50 MCG/ACT nasal spray Place 2 sprays into the nose daily. 34 g 2  . naproxen (NAPROSYN) 500 MG tablet as needed.    . traZODone (DESYREL) 100 MG tablet Take 1 tablet (100 mg total) by mouth at bedtime. 90 tablet 0  . tretinoin (RETIN-A) 0.025 % cream Apply 1 application topically at bedtime.    Marland Kitchen venlafaxine XR (EFFEXOR-XR) 150 MG 24 hr capsule 225 mg daily (75 mg +150 mg ) 90 capsule 0  . venlafaxine XR (EFFEXOR-XR) 75 MG 24 hr capsule 225 mg daily (75 mg +150 mg ) 90 capsule 0   No facility-administered medications prior to visit.    No Known Allergies  Family History  Problem Relation Age of Onset  . Diabetes Father   . Hypertension Father   . Hypertension Mother   . Hypertension Maternal Grandfather   . Diabetes Maternal Grandfather   . Cancer Paternal Grandmother        type unknown  . Hypertension Paternal Grandmother   . Cancer Paternal Grandfather        type unknown  . Hypertension Paternal Grandfather  Social History   Tobacco Use  . Smoking status: Never Smoker  . Smokeless tobacco: Never Used  Substance Use Topics  . Alcohol use: No    Alcohol/week: 0.0 standard drinks  . Drug use: No    ROS: As per history of present illness, otherwise negative  BP 114/80 (BP Location: Left Arm, Patient Position: Sitting, Cuff Size: Normal)   Pulse 64   Temp 98.2 F (36.8 C)   Ht 5' 11.5" (1.816 m) Comment: height measured without shoes  Wt 221 lb 6 oz (100.4 kg)   BMI 30.45 kg/m  Gen: awake, alert, NAD HEENT: anicteric, op clear CV: RRR, no mrg Pulm: CTA b/l Abd: soft, NT/ND, +BS throughout, no hepatosplenomegaly Ext: no c/c/e Neuro: nonfocal   RELEVANT LABS AND IMAGING: CBC    Component Value Date/Time   WBC 5.0 06/08/2019 1139    RBC 5.30 06/08/2019 1139   HGB 15.7 06/08/2019 1139   HCT 46.5 06/08/2019 1139   PLT 373.0 06/08/2019 1139   MCV 87.8 06/08/2019 1139   MCH 29.2 12/29/2018 1420   MCHC 33.7 06/08/2019 1139   RDW 13.2 06/08/2019 1139   LYMPHSABS 2.2 06/08/2019 1139   MONOABS 0.3 06/08/2019 1139   EOSABS 0.3 06/08/2019 1139   BASOSABS 0.0 06/08/2019 1139    CMP     Component Value Date/Time   NA 138 06/08/2019 1139   K 3.6 06/08/2019 1139   CL 102 06/08/2019 1139   CO2 28 06/08/2019 1139   GLUCOSE 106 (H) 06/08/2019 1139   BUN 12 06/08/2019 1139   CREATININE 1.11 06/08/2019 1139   CREATININE 1.14 05/27/2019 0711   CALCIUM 9.7 06/08/2019 1139   PROT 7.7 06/08/2019 1139   ALBUMIN 4.8 06/08/2019 1139   AST 25 06/08/2019 1139   ALT 42 06/08/2019 1139   ALKPHOS 102 06/08/2019 1139   BILITOT 0.5 06/08/2019 1139   GFRNONAA 78 05/27/2019 0711   GFRAA 91 05/27/2019 0711   Results for THADDAEUS, GRANJA (MRN [id number]) as of 06/08/2019 16:52  Ref. Range 02/18/2018 10:47 12/29/2018 14:20 05/27/2019 07:11 06/08/2019 11:39  Alkaline Phosphatase Latest Ref Range: 39 - 117 U/L    102  Albumin Latest Ref Range: 3.5 - 5.2 g/dL    4.8  AG Ratio Latest Ref Range: 1.0 - 2.5 (calc) 1.8 1.7 1.7   AST Latest Ref Range: 0 - 37 U/L 36 28 96 (H) 25  ALT Latest Ref Range: 0 - 53 U/L 54 (H) 59 (H) 122 (H) 42  Total Protein Latest Ref Range: 6.0 - 8.3 g/dL 6.7 7.3 7.1 7.7  Total Bilirubin Latest Ref Range: 0.2 - 1.2 mg/dL 0.5 0.5 0.4 0.5   Lab Results  Component Value Date   INR 1.1 (H) 06/08/2019    Acute viral hepatitis panel negative  ULTRASOUND ABDOMEN LIMITED RIGHT UPPER QUADRANT   COMPARISON:  None.   FINDINGS: Gallbladder:   No gallstones or wall thickening visualized. There is no pericholecystic fluid. No sonographic Murphy sign noted by sonographer.   Common bile duct:   Diameter: 4 mm. No intrahepatic or extrahepatic biliary duct dilatation.   Liver:   No focal lesion identified. Within  normal limits in parenchymal echogenicity. Portal vein is patent on color Doppler imaging with normal direction of blood flow towards the liver.   Other: None.   IMPRESSION: Study within normal limits.     Electronically Signed   By: Bretta Bang III M.D.   On: 06/02/2019 08:34      ASSESSMENT/PLAN:  44 year old male with a past medical history of anxiety, depression, vitamin D deficiency, carpal tunnel syndrome and seasonal allergies who is seen in consultation at the request of Dr. Moshe Cipro to evaluate elevated liver enzymes.   1.  Elevated liver enzymes --he has had persistently elevated liver enzymes, slightly elevated in October 2019 and August 2020 but more elevated most recently in January 21.  AST 96, ALT 122, bilirubin and alkaline phosphatase.  Liver ultrasound performed very recently was normal with no evidence of steatosis. --We discussed elevated liver enzymes today.  Certainly he has no evidence of advanced liver disease or cirrhosis.  There are no definitive inciting medications though we discussed how medicines are often one of the most common causes of elevated liver enzymes.  I have recommended that we do a battery of lab tests to rule out other causes before deciding whether we need to eliminate or change medication. --CBC, CMP, TSH, CRP, iron studies, celiac panel, alpha-1 antitrypsin, mitochondrial antibody, ANA, IgG, anti-smooth muscle antibody, GGT, hepatitis B surface antibody and core total to see if there is immunity, hepatitis A total antibody to see if there is immunity --If no other causes and liver enzymes remain elevated we would consider liver biopsy  Further recommendations after results available      UU:[id number], [place], [place], [place],  [place] [id number]

## 2019-06-08 NOTE — Patient Instructions (Addendum)
If you are age 44 or older, your body mass index should be between 23-30. Your Body mass index is 30.45 kg/m. If this is out of the aforementioned range listed, please consider follow up with your Primary Care Provider.  If you are age 40 or younger, your body mass index should be between 19-25. Your Body mass index is 30.45 kg/m. If this is out of the aformentioned range listed, please consider follow up with your Primary Care Provider.   Your provider has requested that you go to the basement level for lab work before leaving today. Press "B" on the elevator. The lab is located at the first door on the left as you exit the elevator.

## 2019-06-09 ENCOUNTER — Other Ambulatory Visit: Payer: Self-pay

## 2019-06-09 DIAGNOSIS — R7989 Other specified abnormal findings of blood chemistry: Secondary | ICD-10-CM

## 2019-06-10 ENCOUNTER — Ambulatory Visit (INDEPENDENT_AMBULATORY_CARE_PROVIDER_SITE_OTHER): Payer: 59 | Admitting: Psychiatry

## 2019-06-10 ENCOUNTER — Other Ambulatory Visit: Payer: Self-pay

## 2019-06-10 DIAGNOSIS — F322 Major depressive disorder, single episode, severe without psychotic features: Secondary | ICD-10-CM | POA: Diagnosis not present

## 2019-06-10 NOTE — Progress Notes (Signed)
Virtual Visit via Video Note  I connected with Gabriel [name] on 06/10/19 at  4:00 PM EST by a video enabled telemedicine application and verified that I am speaking with the correct person using two identifiers.   I discussed the limitations of evaluation and management by telemedicine and the availability of in person appointments. The patient expressed understanding and agreed to proceed.   I provided 40 minutes of non-face-to-face time during this encounter.   Gabriel Salvage, LCSW   THERAPIST PROGRESS NOTE  Session Time:  Thursday 06/10/2019 4:15 PM - 4:55 PM  Participation Level: Active  Behavioral Response: AlertDepressed/  Type of Therapy: Individual Therapy  Treatment Goals addressed:  alleviate depressive symptoms and resume normal involvement and activity and social activity, learn and implement cognitive and behavioral strategies to overcome depression,   Interventions: CBT and Supportive  Summary: Gabriel [name] is a 44 y.o. male who is referred for services by PCP Dr. Syliva [name] due to patient experiencing symptoms of depression. He denies any psychiatric hospitalizations, He saw therapist Gabriel [name] in [place] for a few months. Patient reports symptoms began when he lost his job about a year ago. Per his report, he was fired after he complained about race discrimination. 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 He states now feeling as though he failed his family. He states not wanting to be around anyone and having difficulty engaging with his wife and children. He fears losing his family.  He reports ruminating thoughts about job and the past, excessive worry depressed mood, tearfulness, anxiety, isolative behaviors, poor motivation, poor concentration, irritability, sleep difficulty, thoughts and feelings of hopelessness and  worthlessness. He denies any suicidal ideations.  Patient last was seen via virtual visit about 3 weeks ago. He reports no change in symptoms since last session. He continues to report thoughts and feelings of hopelessness and worthlessness but denies suicidal ideations.  He reports little to no involvement in activity. He reports continued thoughts of being a failure. He reports continued sleep difficulty, poor concentration, poor appetite, poor motivation, lack of interest in activities, depressed mood, and ruminating thoughts.  He reports he has began taking a different antidepressant.  Per his report, he has not noticed any changes since taking the medication.  He reports he did not follow through with plan to assist wife with washing dishes.  Suicidal/Homicidal: Nowithout intent/plan  Therapist Response:  reviewed symptoms, administered PHQ-9, assisted patient identify thoughts/feelings, and behaviors that inhibited implementation of plan, assisted patient identify alternative plan to increase behavioral activation that may be more achievable, developed plan with patient to walk up and down his driveway once on Monday Wednesdays and Fridays, assisted patient identify and address thoughts and processes that may inhibit implementation of plan, used cognitive defusion to help patient cope with thoughts that may inhibit implementation of plan, assisted patient develop list of benefits of implementing plan and consequences of not implementing plan, assigned patient to read list daily,  Plan: Return again in 1-2  weeks  Diagnosis: Axis I: MDD, single episode, severe    Axis II: No diagnosis    Gabriel Salvage, LCSW 06/10/2019

## 2019-06-11 LAB — ALPHA-1-ANTITRYPSIN: A-1 Antitrypsin, Ser: 124 mg/dL (ref 83–199)

## 2019-06-11 LAB — HEPATITIS A ANTIBODY, TOTAL: Hepatitis A AB,Total: NONREACTIVE

## 2019-06-11 LAB — HEPATITIS B CORE ANTIBODY, IGM: Hep B C IgM: NONREACTIVE

## 2019-06-11 LAB — IGG: IgG (Immunoglobin G), Serum: 1347 mg/dL (ref 600–1640)

## 2019-06-11 LAB — ANA: Anti Nuclear Antibody (ANA): NEGATIVE

## 2019-06-11 LAB — ANTI-SMOOTH MUSCLE ANTIBODY, IGG: Actin (Smooth Muscle) Antibody (IGG): <20 U (ref <20)

## 2019-06-11 LAB — HEPATITIS B SURFACE ANTIBODY,QUALITATIVE: Hep B S Ab: NONREACTIVE

## 2019-06-11 LAB — CERULOPLASMIN: Ceruloplasmin: 36 mg/dL (ref 18–36)

## 2019-06-11 LAB — MITOCHONDRIAL ANTIBODIES: Mitochondrial M2 Ab, IgG: <=20 U

## 2019-06-14 ENCOUNTER — Encounter: Payer: Self-pay | Admitting: Family Medicine

## 2019-06-14 ENCOUNTER — Other Ambulatory Visit: Payer: Self-pay

## 2019-06-14 DIAGNOSIS — E785 Hyperlipidemia, unspecified: Secondary | ICD-10-CM | POA: Insufficient documentation

## 2019-06-14 DIAGNOSIS — R7989 Other specified abnormal findings of blood chemistry: Secondary | ICD-10-CM

## 2019-06-14 NOTE — Assessment & Plan Note (Signed)
Hyperlipidemia:Low fat diet discussed and encouraged.   Lipid Panel  Lab Results  Component Value Date   CHOL 195 05/27/2019   HDL 44 05/27/2019   LDLCALC 131 (H) 05/27/2019   TRIG 92 05/27/2019   CHOLHDL 4.4 05/27/2019   Needs to reduce fat intake

## 2019-06-14 NOTE — Assessment & Plan Note (Signed)
Patient educated about the importance of limiting  Carbohydrate intake , the need to commit to daily physical activity for a minimum of 30 minutes , and to commit weight loss. The fact that changes in all these areas will reduce or eliminate all together the development of diabetes is stressed.   Diabetic Labs Latest Ref Rng & Units 06/08/2019 05/27/2019 12/29/2018 02/18/2018 12/04/2016  HbA1c <5.7 % of total Hgb - 6.4(H) 6.4(H) - 5.9(H)  Chol <200 mg/dL - 217 471 595 396  HDL > OR = 40 mg/dL - 44 72(W) 97(V) 56  Calc LDL mg/dL (calc) - 150(C) 136(C) 108(H) 98  Triglycerides <150 mg/dL - 92 72 61 49  Creatinine 0.40 - 1.50 mg/dL 3.83 7.79 3.96 8.86 4.84  GFR >60.00 mL/min 87.22 - - - -   BP/Weight 06/08/2019 05/31/2019 12/29/2018 12/18/2018 10/28/2018 08/12/2018 03/17/2018  Systolic BP 114 112 112 - 110 - 120  Diastolic BP 80 80 80 - 70 - 80  Wt. (Lbs) 221.38 215 210 200 218.04 209 206.08  BMI 30.45 29.57 28.48 27.12 29.57 28.35 27.95  Some encounter information is confidential and restricted. Go to Review Flowsheets activity to see all data.   No flowsheet data found.  Unchanged

## 2019-06-14 NOTE — Assessment & Plan Note (Signed)
Unchanged and debilitating, patient unable to concentrate, focus and carry out any  Meaningful work Recommend seeking disability due to severe depression and wil also discuss with with wife

## 2019-06-14 NOTE — Assessment & Plan Note (Signed)
  Patient re-educated about  the importance of commitment to a  minimum of 150 minutes of exercise per week as able.  The importance of healthy food choices with portion control discussed, as well as eating regularly and within a 12 hour window most days. The need to choose "clean , green" food 50 to 75% of the time is discussed, as well as to make water the primary drink and set a goal of 64 ounces water daily.    Weight /BMI 06/08/2019 05/31/2019 12/29/2018  WEIGHT 221 lb 6 oz 215 lb 210 lb  HEIGHT 5' 11.5" 6\' 0"  6\' 0"   BMI 30.45 kg/m2 29.16 kg/m2 28.48 kg/m2  Some encounter information is confidential and restricted. Go to Review Flowsheets activity to see all data.

## 2019-06-14 NOTE — Assessment & Plan Note (Signed)
Symptomatic and worsening over time, refer fornerve testing

## 2019-06-17 ENCOUNTER — Telehealth: Payer: Self-pay

## 2019-06-17 NOTE — Telephone Encounter (Signed)
-----   Message from Kerri Perches, MD sent at 06/14/2019  1:37 PM EST ----- Regarding: vaccine update / need Recommendation is that he get hepatitis A and hepatitis B vaccines, do we order these or just administer, pls follow through , ins is [facility], thanks ----- Message ----- From: Chrystie Nose, RN Sent: 06/14/2019  11:59 AM EST To: Kerri Perches, MD  Spoke with pt and he is aware. States he would like to get the vaccines with his PCP as they are closer to him. Order and reminder in epic for repeat labs. Pt knows to contact his PCP for vaccine appts.

## 2019-06-30 ENCOUNTER — Encounter: Payer: Self-pay | Admitting: Psychiatry

## 2019-06-30 ENCOUNTER — Ambulatory Visit (INDEPENDENT_AMBULATORY_CARE_PROVIDER_SITE_OTHER): Payer: 59 | Admitting: Psychiatry

## 2019-06-30 ENCOUNTER — Other Ambulatory Visit: Payer: Self-pay

## 2019-06-30 DIAGNOSIS — F321 Major depressive disorder, single episode, moderate: Secondary | ICD-10-CM | POA: Diagnosis not present

## 2019-06-30 DIAGNOSIS — F419 Anxiety disorder, unspecified: Secondary | ICD-10-CM | POA: Diagnosis not present

## 2019-06-30 MED ORDER — VENLAFAXINE HCL ER 75 MG PO CP24: ORAL_CAPSULE | ORAL | 0 refills | Status: DC

## 2019-06-30 MED ORDER — VENLAFAXINE HCL ER 150 MG PO CP24: ORAL_CAPSULE | ORAL | 0 refills | Status: DC

## 2019-06-30 MED ORDER — BUPROPION HCL ER (XL) 150 MG PO TB24: 150.0000 mg | ORAL_TABLET | ORAL | 0 refills | Status: DC

## 2019-06-30 MED ORDER — TRAZODONE HCL 100 MG PO TABS: ORAL_TABLET | ORAL | 0 refills | Status: DC

## 2019-06-30 MED FILL — buPROPion HCL ER (XL) 150 M: 150 | 90 days supply | Qty: 90 | Fill #0

## 2019-06-30 MED FILL — traZODone HCL 100 MG TABS: 100 | 90 days supply | Qty: 180 | Fill #0

## 2019-06-30 NOTE — Progress Notes (Signed)
BH MD OP Progress Note  I connected with  Gabriel [name] on 06/30/19 by a video enabled telemedicine application and verified that I am speaking with the correct person using two identifiers.   I discussed the limitations of evaluation and management by telemedicine. The patient expressed understanding and agreed to proceed.    06/30/2019 2:02 PM Vernal Hritz  MRN:  [id number]  Chief Complaint: " I am the same."  HPI: Patient reported that he is still doing the same.  He still feels depressed and tired all the time.  He said he has no desire to do anything.  He shook his head to answer no when asked if he was having any suicidal ideations.  He does not think the medication changes have helped much.  He stated that he still not sleeping well at night. Patient was somewhat guarded and evasive and did not elaborate much on anything. When asked if he was really taking his medications the way he was, he said his wife is the one gives him the medications so he does get them regularly. Patient was asked if he thinks he will consider going to an inpatient psychiatry hospital for stabilization to which patient replied that he does not want to go to any hospital. Patient was agreeable to increasing the dose of trazodone to help him sleep better at night.  I asked him for his consent to speak to his wife.  Patient gave his consent. I spoke to his wife on the phone.  She informed that she works at the [employer] [facility] clinic in [place].  She acknowledged that patient continues to feel depressed.  She also informed that he does not sleep well at night.  She stated that all her symptoms relate back to something that happened 2 years ago with him at work.  She stated that the current state of affairs is not helping his condition much.  She understands that the medications can only help so much.  She informed that she has already applied for disability on his behalf.  She was agreeable for the dose of  trazodone to be increased to 200 mg for optimal effect.   Visit Diagnosis:    ICD-10-CM   1. Current moderate episode of major depressive disorder without prior episode (HCC)  F32.1   2. Anxiety  F41.9     Past Psychiatric History: Depression, anxiety  Past Medical History:  Past Medical History:  Diagnosis Date  . Anxiety   . Carpal tunnel syndrome 2018  . Depression   . Elevated LFTs   . Heart murmur   . Insomnia   . Seasonal allergies   . Vitamin D deficiency     Past Surgical History:  Procedure Laterality Date  . NO PAST SURGERIES      Family Psychiatric History: See below  Family History:  Family History  Problem Relation Age of Onset  . Diabetes Father   . Hypertension Father   . Hypertension Mother   . Hypertension Maternal Grandfather   . Diabetes Maternal Grandfather   . Cancer Paternal Grandmother        type unknown  . Hypertension Paternal Grandmother   . Cancer Paternal Grandfather        type unknown  . Hypertension Paternal Grandfather     Social History:  Social History   Socioeconomic History  . Marital status: Married    Spouse name: Not on file  . Number of children: 2  . Years of education: 40  .  Highest education level: High school graduate  Occupational History  . Occupation: not employed  Tobacco Use  . Smoking status: Never Smoker  . Smokeless tobacco: Never Used  Substance and Sexual Activity  . Alcohol use: No    Alcohol/week: 0.0 standard drinks  . Drug use: No  . Sexual activity: Yes    Birth control/protection: None  Other Topics Concern  . Not on file  Social History Narrative   He lives with wife and two children.   Unemployed at this present time   Right handed   One story home   Highest level of education:  12th grade   Social Determinants of Health   Financial Resource Strain:   . Difficulty of Paying Living Expenses: Not on file  Food Insecurity:   . Worried About Programme researcher, broadcasting/film/video in the Last Year:  Not on file  . Ran Out of Food in the Last Year: Not on file  Transportation Needs:   . Lack of Transportation (Medical): Not on file  . Lack of Transportation (Non-Medical): Not on file  Physical Activity:   . Days of Exercise per Week: Not on file  . Minutes of Exercise per Session: Not on file  Stress:   . Feeling of Stress : Not on file  Social Connections:   . Frequency of Communication with Friends and Family: Not on file  . Frequency of Social Gatherings with Friends and Family: Not on file  . Attends Religious Services: Not on file  . Active Member of Clubs or Organizations: Not on file  . Attends Banker Meetings: Not on file  . Marital Status: Not on file    Allergies: No Known Allergies  Metabolic Disorder Labs: Lab Results  Component Value Date   HGBA1C 6.4 (H) 05/27/2019   MPG 137 05/27/2019   MPG 137 12/29/2018   No results found for: PROLACTIN Lab Results  Component Value Date   CHOL 195 05/27/2019   TRIG 92 05/27/2019   HDL 44 05/27/2019   CHOLHDL 4.4 05/27/2019   VLDL 10 12/04/2016   LDLCALC 131 (H) 05/27/2019   LDLCALC 128 (H) 12/29/2018   Lab Results  Component Value Date   TSH 1.93 06/08/2019   TSH 2.14 12/29/2018    Therapeutic Level Labs: No results found for: LITHIUM No results found for: VALPROATE No components found for:  CBMZ  Current Medications: Current Outpatient Medications  Medication Sig Dispense Refill  . buPROPion (WELLBUTRIN XL) 150 MG 24 hr tablet Take 1 tablet (150 mg total) by mouth every morning. 90 tablet 0  . gabapentin (NEURONTIN) 300 MG capsule Take 1 tablet at bedtime for one week, then increase to 1 tablet twice daily. 60 capsule 3  . hydrocortisone 2.5 % cream Apply 1 application topically as directed.    . loratadine (CLARITIN) 10 MG tablet Take 1 tablet (10 mg total) by mouth daily. 90 tablet 3  . mometasone (NASONEX) 50 MCG/ACT nasal spray Place 2 sprays into the nose daily. 34 g 2  . naproxen  (NAPROSYN) 500 MG tablet as needed.    . traZODone (DESYREL) 100 MG tablet Take 1 tablet (100 mg total) by mouth at bedtime. 90 tablet 0  . tretinoin (RETIN-A) 0.025 % cream Apply 1 application topically at bedtime.    Marland Kitchen venlafaxine XR (EFFEXOR-XR) 150 MG 24 hr capsule 225 mg daily (75 mg +150 mg ) 90 capsule 0  . venlafaxine XR (EFFEXOR-XR) 75 MG 24 hr capsule 225 mg  daily (75 mg +150 mg ) 90 capsule 0   No current facility-administered medications for this visit.     Musculoskeletal: Strength & Muscle Tone: unable to assess due to telemed visit Gait & Station: unable to assess due to telemed visit Patient leans: unable to assess due to telemed visit  Psychiatric Specialty Exam: Review of Systems  There were no vitals taken for this visit.There is no height or weight on file to calculate BMI.  General Appearance: Fairly Groomed and Guarded  Eye Contact:  Fair  Speech:  Clear and Coherent and Normal Rate  Volume:  Normal  Mood:  Depressed and Dysphoric  Affect:  Depressed  Thought Process:  Goal Directed, Linear and Descriptions of Associations: Intact  Orientation:  Full (Time, Place, and Person)  Thought Content: Logical   Suicidal Thoughts:  No  Homicidal Thoughts:  No  Memory:  Recent;   Good Remote;   Good  Judgement:  Fair  Insight:  Fair  Psychomotor Activity:  Normal  Concentration:  Concentration: Good and Attention Span: Good  Recall:  Good  Fund of Knowledge: Good  Language: Good  Akathisia:  Negative  Handed:  Right  AIMS (if indicated): not done due to telemed visit  Assets:  Communication Skills Desire for Improvement Financial Resources/Insurance Housing  ADL's:  Intact  Cognition: WNL  Sleep:  Poor   Screenings: GAD-7     Office Visit from 12/29/2018 in [facility] Office Visit from 10/28/2018 in [facility] Office Visit from 01/28/2018 in [facility] Phone Follow Up from 01/08/2018 in [facility] Phone Follow Up from 12/04/2017 in [facility]  Total GAD-7 Score  21  20  15  10  15     PHQ2-9     Counselor from 06/10/2019 in [facility] Office Visit from 05/31/2019 in [facility] from 05/18/2019 in [facility] from 02/15/2019 in [facility] Office Visit from 12/29/2018 in [facility]  PHQ-2 Total Score  6  6  6  6  6   PHQ-9 Total Score  24  22  22  22  21        Assessment and Plan: Patient continues to be depressed with poor sleep.  He was agreeable to increasing the dose of trazodone.  I spoke with his wife for collateral information who acknowledged his symptoms and was agreeable with the medication plan.  1. Current moderate episode of major depressive disorder without prior episode (HCC)  - Increase traZODone (DESYREL) 100 MG tablet; Take 2 tablets at bedtime for sleep  Dispense: 180 tablet; Refill: 0 - Continue buPROPion (WELLBUTRIN XL) 150 MG 24 hr tablet; Take 1 tablet (150 mg total) by mouth every morning.  Dispense: 90 tablet; Refill: 0 - Continue venlafaxine XR (EFFEXOR-XR) 150 MG 24 hr capsule; 225 mg daily (75 mg +150 mg )  Dispense: 90 capsule; Refill: 0 - Continue venlafaxine XR (EFFEXOR-XR) 75 MG 24 hr capsule; 225 mg daily (75 mg +150 mg )  Dispense: 90 capsule; Refill: 0  2. Anxiety  - venlafaxine XR (EFFEXOR-XR) 150 MG 24 hr capsule; 225 mg daily (75 mg +150 mg )  Dispense: 90 capsule; Refill: 0 - venlafaxine XR (EFFEXOR-XR) 75 MG 24 hr capsule; 225 mg daily (75 mg +150 mg )  Dispense: 90 capsule; Refill: 0  - venlafaxine XR (EFFEXOR-XR) 150 MG 24 hr capsule; 225 mg daily (  75 mg +150 mg )  Dispense: 90 capsule; Refill: 0 - venlafaxine XR (EFFEXOR-XR) 75 MG 24 hr capsule; 225 mg daily (75 mg +150 mg )  Dispense: 90 capsule; Refill: 0  Continue individual therapy with Ms.  Peggy. F/up in 4 weeks.  Zena Amos, MD 06/30/2019, 2:02 PM

## 2019-07-02 ENCOUNTER — Ambulatory Visit (INDEPENDENT_AMBULATORY_CARE_PROVIDER_SITE_OTHER): Payer: 59 | Admitting: Psychiatry

## 2019-07-02 ENCOUNTER — Other Ambulatory Visit: Payer: Self-pay

## 2019-07-02 DIAGNOSIS — F322 Major depressive disorder, single episode, severe without psychotic features: Secondary | ICD-10-CM | POA: Diagnosis not present

## 2019-07-02 NOTE — Progress Notes (Signed)
Virtual Visit via Video Note  I connected with Gabriel [name] on 07/02/19 at 10:15 AM  by a video enabled telemedicine application and verified that I am speaking with the correct person using two identifiers.   I discussed the limitations of evaluation and management by telemedicine and the availability of in person appointments. The patient expressed understanding and agreed to proceed.  I provided 45 minutes of non-face-to-face time during this encounter.   Gabriel Salvage, LCSW   THERAPIST PROGRESS NOTE  Session Time:  Friday 07/02/2019 10:15 AM - 11:00 AM   Participation Level: Active  Behavioral Response: AlertDepressed/  Type of Therapy: Individual Therapy  Treatment Goals addressed:  alleviate depressive symptoms and resume normal involvement and activity and social activity, learn and implement cognitive and behavioral strategies to overcome depression,   Interventions: CBT and Supportive  Summary: Gabriel [name] is a 44 y.o. male who is referred for services by PCP Gabriel [name] due to patient experiencing symptoms of depression. He denies any psychiatric hospitalizations, He saw therapist Gabriel [name] in [place] for a few months. Patient reports symptoms began when he lost his job about a year ago. Per his report, he was fired after he complained about race discrimination. 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 He states now feeling as though he failed his family. He states not wanting to be around anyone and having difficulty engaging with his wife and children. He fears losing his family.  He reports ruminating thoughts about job and the past, excessive worry depressed mood, tearfulness, anxiety, isolative behaviors, poor motivation, poor concentration, irritability, sleep difficulty, thoughts and feelings of hopelessness and  worthlessness. He denies any suicidal ideations.  Patient last was seen via virtual visit about 3 weeks ago. He reports no change in symptoms since last session. He continues to report thoughts and feelings of hopelessness and worthlessness but denies suicidal ideations.  He reports little to no involvement in activity.  He says his wife did take him to the barbershop for a haircut and reports he felt nervous but says he felt better.  He reports continued poor self-care regarding daily hygiene.  Per his report, he takes a shower and brushes his teeth about every 3 days.  He reports continued thoughts of being a failure.  He reports thoughts of opportunities being taken away from him on his last job.  He reports continued sleep difficulty, poor concentration, poor appetite, poor motivation, lack of interest in activities, depressed mood, and ruminating thoughts.  He eeports maintaining medication compliance he has began taking a different antidepressant.  He reports attempting to implement walking plan twice since last session but says he stopped because he felt dizzy.    Suicidal/Homicidal: Nowithout intent/plan  Therapist Response:  reviewed symptoms, assisted patient identify thoughts/feelings, and behaviors that inhibited implementation of plan, discussed possibility of patient talking with PCP regarding dizziness while walking, assisted patient identify alternative plan to increase behavioral activation and self-care that may be more achievable, developed plan with patient to shower and brush his teeth daily, assisted patient identify and address thoughts and processes that may inhibit implementation of plan, used cognitive defusion to help patient cope with thoughts that may inhibit implementation of plan, began to assist patient explore feelings and thoughts more regarding losing his job, assisted patient began to identify and verbalize  feelings of anger, began to discuss the connection between  depression and repressed anger  Plan: Return again in 1-2  weeks  Diagnosis: Axis I: MDD, single episode, severe    Axis II: No diagnosis    Gabriel Smoker, LCSW 07/02/2019

## 2019-07-16 ENCOUNTER — Other Ambulatory Visit: Payer: Self-pay

## 2019-07-16 ENCOUNTER — Encounter (HOSPITAL_COMMUNITY): Payer: Self-pay | Admitting: Psychiatry

## 2019-07-16 ENCOUNTER — Ambulatory Visit (INDEPENDENT_AMBULATORY_CARE_PROVIDER_SITE_OTHER): Payer: 59 | Admitting: Psychiatry

## 2019-07-16 DIAGNOSIS — F322 Major depressive disorder, single episode, severe without psychotic features: Secondary | ICD-10-CM

## 2019-07-16 NOTE — Progress Notes (Signed)
Virtual Visit via Video Note  I connected with Gabriel [name] on 07/16/19 at 10:15 AM by a video enabled telemedicine application and verified that I am speaking with the correct person using two identifiers.   I discussed the limitations of evaluation and management by telemedicine and the availability of in person appointments. The patient expressed understanding and agreed to proceed.  I provided 45 minutes of non-face-to-face time during this encounter.   [name], LCSW   THERAPIST PROGRESS NOTE  Session Time:  Friday 07/16/2019 10:15 AM - 11:00 AM   Participation Level: Active  Behavioral Response: AlertDepressed/  Type of Therapy: Individual Therapy  Treatment Goals addressed:  alleviate depressive symptoms and resume normal involvement and activity and social activity, learn and implement cognitive and behavioral strategies to overcome depression,   Interventions: CBT and Supportive  Summary: Gabriel [name] is a 44 y.o. male who is referred for services by PCP Dr. Syliva Overman due to patient experiencing symptoms of depression. He denies any psychiatric hospitalizations, He saw therapist Dorann Lodge in [place] for a few months. Patient reports symptoms began when he lost his job about a year ago. Per his report, he was fired after he complained about race discrimination. 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 He states now feeling as though he failed his family. He states not wanting to be around anyone and having difficulty engaging with his wife and children. He fears losing his family.  He reports ruminating thoughts about job and the past, excessive worry depressed mood, tearfulness, anxiety, isolative behaviors, poor motivation, poor concentration, irritability, sleep difficulty, thoughts and feelings of hopelessness and  worthlessness. He denies any suicidal ideations.  Patient last was seen via virtual visit about 2 weeks ago. He reports no change in symptoms since last session. He continues to report thoughts and feelings of hopelessness and worthlessness but denies suicidal ideations.  He reports little to no involvement in activity.  He says he brushed his teeth and showered twice since last session.  Per his report, his wife gave him a reminder.  He reports he did not shower and brushes teeth other days as he just did not feel like it.  He reports continued ruminating thoughts about loss of his job and opportunities being taken away from him on his last job.  He reports continued sleep difficulty, memory difficulty, poor concentration, poor appetite, poor motivation, lack of interest in activities, depressed mood, and ruminating thoughts.  He eeports maintaining medication compliance but says he cannot tell any difference with the medication.  Suicidal/Homicidal: Nowithout intent/plan  Therapist Response:  reviewed symptoms, reviewed treatment plan, obtained patient's permission to initial review for patient as this was a virtual visit, assisted patient identify and address thoughts and processes that inhibited implementation of patient brushing teeth and taking a shower daily, assisted patient identify his value and reason for working on goals to help promote improved self-care and increased behavioral activation, developed plan with patient to brush teeth and take a shower daily, assisted patient explore his thought patterns about job loss and the way he is relating to his thoughts,  used cognitive defusion to try to assist patient cope with ruminating thoughts about the past.  Plan: Return again in 1-2  weeks  Diagnosis: Axis I: MDD, single episode, severe    Axis II:  No diagnosis    [name], LCSW 07/16/2019

## 2019-07-19 NOTE — Progress Notes (Signed)
Virtual Visit via Video Note  I connected with Gabriel [name] on 07/27/19 at  2:30 PM EDT by a video enabled telemedicine application and verified that I am speaking with the correct person using two identifiers.   I discussed the limitations of evaluation and management by telemedicine and the availability of in person appointments. The patient expressed understanding and agreed to proceed.      I discussed the assessment and treatment plan with the patient. The patient was provided an opportunity to ask questions and all were answered. The patient agreed with the plan and demonstrated an understanding of the instructions.   The patient was advised to call back or seek an in-person evaluation if the symptoms worsen or if the condition fails to improve as anticipated.  I provided 20 minutes of non-face-to-face time during this encounter.   Gabriel Hotter, MD    [facility] MD/PA/NP OP Progress Note  07/27/2019 3:04 PM Gabriel [name]  MRN:  [id number]  Chief Complaint:  Chief Complaint    Depression; Follow-up     HPI:  - bupropion was added at the visit with Dr. Evelene Croon  This is a follow-up appointment for depression.  He states that he feels the same.  He tends to feel drowsy after starting bupropion.  He states around in the house all day long.  He tends to think about "the whole situation" about him having lost his career. He complains of hypersomnia.  His appetite fluctuates.  He eats at least once a day.  He has difficulty in concentration.  He denies SI.  He feels anxious and tense.  He has panic attacks.  He complains of numbness in his leg; upcoming appointment.  His wife takes care of his medication.   215 lbs Wt Readings from Last 3 Encounters:  06/08/19 221 lb 6 oz (100.4 kg)  05/31/19 215 lb (97.5 kg)  12/29/18 210 lb (95.3 kg)    Visit Diagnosis:    ICD-10-CM   1. Current severe episode of major depressive disorder without psychotic features without prior episode  (HCC)  F32.2     Past Psychiatric History: Please see initial evaluation for full details. I have reviewed the history. No updates at this time.     Past Medical History:  Past Medical History:  Diagnosis Date  . Anxiety   . Carpal tunnel syndrome 2018  . Depression   . Elevated LFTs   . Heart murmur   . Insomnia   . Seasonal allergies   . Vitamin D deficiency     Past Surgical History:  Procedure Laterality Date  . NO PAST SURGERIES      Family Psychiatric History: Please see initial evaluation for full details. I have reviewed the history. No updates at this time.     Family History:  Family History  Problem Relation Age of Onset  . Diabetes Father   . Hypertension Father   . Hypertension Mother   . Hypertension Maternal Grandfather   . Diabetes Maternal Grandfather   . Cancer Paternal Grandmother        type unknown  . Hypertension Paternal Grandmother   . Cancer Paternal Grandfather        type unknown  . Hypertension Paternal Grandfather     Social History:  Social History   Socioeconomic History  . Marital status: Married    Spouse name: Not on file  . Number of children: 2  . Years of education: 65  . Highest education level: High school  graduate  Occupational History  . Occupation: not employed  Tobacco Use  . Smoking status: Never Smoker  . Smokeless tobacco: Never Used  Substance and Sexual Activity  . Alcohol use: No    Alcohol/week: 0.0 standard drinks  . Drug use: No  . Sexual activity: Yes    Birth control/protection: None  Other Topics Concern  . Not on file  Social History Narrative   He lives with wife and two children.   Unemployed at this present time   Right handed   One story home   Highest level of education:  12th grade   Social Determinants of Health   Financial Resource Strain:   . Difficulty of Paying Living Expenses:   Food Insecurity:   . Worried About Programme researcher, broadcasting/film/video in the Last Year:   . Barista  in the Last Year:   Transportation Needs:   . Freight forwarder (Medical):   Marland Kitchen Lack of Transportation (Non-Medical):   Physical Activity:   . Days of Exercise per Week:   . Minutes of Exercise per Session:   Stress:   . Feeling of Stress :   Social Connections:   . Frequency of Communication with Friends and Family:   . Frequency of Social Gatherings with Friends and Family:   . Attends Religious Services:   . Active Member of Clubs or Organizations:   . Attends Banker Meetings:   Marland Kitchen Marital Status:     Allergies: No Known Allergies  Metabolic Disorder Labs: Lab Results  Component Value Date   HGBA1C 6.4 (H) 05/27/2019   MPG 137 05/27/2019   MPG 137 12/29/2018   No results found for: PROLACTIN Lab Results  Component Value Date   CHOL 195 05/27/2019   TRIG 92 05/27/2019   HDL 44 05/27/2019   CHOLHDL 4.4 05/27/2019   VLDL 10 12/04/2016   LDLCALC 131 (H) 05/27/2019   LDLCALC 128 (H) 12/29/2018   Lab Results  Component Value Date   TSH 1.93 06/08/2019   TSH 2.14 12/29/2018    Therapeutic Level Labs: No results found for: LITHIUM No results found for: VALPROATE No components found for:  CBMZ  Current Medications: Current Outpatient Medications  Medication Sig Dispense Refill  . brexpiprazole (REXULTI) 1 MG TABS tablet Take 0.5 tablets (0.5 mg total) by mouth daily. 30 tablet 0  . buPROPion (WELLBUTRIN XL) 150 MG 24 hr tablet Take 1 tablet (150 mg total) by mouth every morning. 90 tablet 0  . gabapentin (NEURONTIN) 300 MG capsule Take 1 tablet at bedtime for one week, then increase to 1 tablet twice daily. 60 capsule 3  . hydrocortisone 2.5 % cream Apply 1 application topically as directed.    . loratadine (CLARITIN) 10 MG tablet Take 1 tablet (10 mg total) by mouth daily. 90 tablet 3  . mometasone (NASONEX) 50 MCG/ACT nasal spray Place 2 sprays into the nose daily. 34 g 2  . naproxen (NAPROSYN) 500 MG tablet as needed.    . traZODone (DESYREL)  100 MG tablet Take 2 tablets at bedtime for sleep 180 tablet 0  . tretinoin (RETIN-A) 0.025 % cream Apply 1 application topically at bedtime.    Marland Kitchen venlafaxine XR (EFFEXOR-XR) 150 MG 24 hr capsule 225 mg daily (75 mg +150 mg ) 90 capsule 0  . venlafaxine XR (EFFEXOR-XR) 75 MG 24 hr capsule 225 mg daily (75 mg +150 mg ) 90 capsule 0   No current facility-administered medications for  this visit.     Musculoskeletal: Strength & Muscle Tone: N/A Gait & Station: N/A Patient leans: N/A  Psychiatric Specialty Exam: Review of Systems  Psychiatric/Behavioral: Positive for decreased concentration, dysphoric mood and sleep disturbance. Negative for agitation, behavioral problems, confusion, hallucinations, self-injury and suicidal ideas. The patient is nervous/anxious. The patient is not hyperactive.   All other systems reviewed and are negative.   There were no vitals taken for this visit.There is no height or weight on file to calculate BMI.  General Appearance: Fairly Groomed  Eye Contact:  Fair  Speech:  Clear and Coherent  Volume:  Normal  Mood:  Anxious and Depressed  Affect:  Appropriate, Congruent and Restricted  Thought Process:  Coherent  Orientation:  Full (Time, Place, and Person)  Thought Content: Logical   Suicidal Thoughts:  No  Homicidal Thoughts:  No  Memory:  Immediate;   Good  Judgement:  Good  Insight:  Good  Psychomotor Activity:  Normal  Concentration:  Concentration: Poor and Attention Span: Poor  Recall:  Poor  Fund of Knowledge: Good  Language: Good  Akathisia:  No  Handed:  Right  AIMS (if indicated): not done  Assets:  Communication Skills Desire for Improvement  ADL's:  Intact  Cognition: WNL  Sleep:  Poor   Screenings: GAD-7     Office Visit from 12/29/2018 in [facility] Office Visit from 10/28/2018 in [facility] Office Visit from 01/28/2018 in [facility] Virtual [facility] Phone Follow Up from 01/08/2018 in [facility] Phone Follow Up from 12/04/2017 in [facility]  Total GAD-7 Score  21  20  15  10  15     PHQ2-9     Counselor from [facility]10[facility]2019 [facility] Office Visit from 05/31/2019 in [facility] Counselor from 05/18/2019 [facility] Counselor from 02/15/2019 in [facility] Office Visit from 12/29/2018 in [facility]  PHQ-2 Total Score  6  6  6  6  6   PHQ-9 Total Score  24  22  22  22  21        Assessment and Plan:  Gabriel [name] is a 44 y.o. year old male with a history of depression,,bilateral carpel tunnel syndrome , who presents for follow up appointment for Current severe episode of major depressive disorder without psychotic features without prior episode (HCC)  # MDD, severe, single without psychotic features Exam is notable for impairment in inattention, and he continues to complain depressive symptoms with prominent fatigue/anhedonia.  Psychosocial stressors includes unemployment, and termination of his job, which he perceives as racial discrimination.  Will discontinue bupropion due to his perceived drowsiness from this medication.  Will add Rexulti as adjunctive treatment for depression.  Discussed potential metabolic side effect and EPS.  Will continue venlafaxine for depression.  Discussed with the patient regarding the treatment option of ECT given his depression refractory to pharmacological treatment. He will discuss this with his wife. He also agrees that his wife to be present at the next visit.    Plan 1.Continuevenlafaxine 225mg  (150 mg + 75 mg)daily 2. Start Rexulti 0.5 mg daily  3. Discontinue bupropion  4. Next appointment: 4/13 at 1:30 for 30 mins, video 5. Please discuss with your wife if you would be interested in referral for ECT (Electroconvulsive therapy)  Past trials of  medication:sertraline, fluoxetine,mirtazapine, nortriptyline for carpel tunnel syndrome,bupropion (drowsiness), quetiapine (drowsiness),Abilify ("off balance"), Trazodone, Ambien (  limited benefit), lorazepam (worsening in anxiety, drowsiness)   The patient demonstrates the following risk factors for suicide: Chronic risk factors for suicide include:psychiatric disorder ofdepression. Acute risk factorsfor suicide include: unemployment and loss (financial, interpersonal, professional). Protective factorsfor this patient include: positive social support, responsibility to others (children, family) and hope for the future. Considering these factors, the overall suicide risk at this point appears to below. Patientisappropriate for outpatient follow up.  [name], MD 07/27/2019, 3:04 PM

## 2019-07-27 ENCOUNTER — Encounter (HOSPITAL_COMMUNITY): Payer: Self-pay | Admitting: Psychiatry

## 2019-07-27 ENCOUNTER — Other Ambulatory Visit: Payer: Self-pay

## 2019-07-27 ENCOUNTER — Ambulatory Visit (INDEPENDENT_AMBULATORY_CARE_PROVIDER_SITE_OTHER): Payer: 59 | Admitting: Psychiatry

## 2019-07-27 DIAGNOSIS — F322 Major depressive disorder, single episode, severe without psychotic features: Secondary | ICD-10-CM

## 2019-07-27 MED ORDER — BREXPIPRAZOLE 1 MG PO TABS: 0.5000 mg | ORAL_TABLET | Freq: Every day | ORAL | 0 refills | Status: DC

## 2019-07-27 MED FILL — REXULTI 1 MG TABLET: 1 | 60 days supply | Qty: 30 | Fill #0

## 2019-07-27 NOTE — Patient Instructions (Addendum)
1.Continuevenlafaxine 225mg  (150 mg + 75 mg)daily 2. Start Rexulti 0.5 mg daily (Please look up this medication online so that you get coupon) 3. Discontinue bupropion  4. Next appointment: 4/13 at 1:30. Please ask your wife to be present at the interview if able.  5. Please discuss with your wife if you would be interested in referral for ECT (Electroconvulsive therapy)

## 2019-07-30 ENCOUNTER — Other Ambulatory Visit: Payer: Self-pay

## 2019-07-30 ENCOUNTER — Ambulatory Visit (INDEPENDENT_AMBULATORY_CARE_PROVIDER_SITE_OTHER): Payer: 59 | Admitting: Psychiatry

## 2019-07-30 DIAGNOSIS — F322 Major depressive disorder, single episode, severe without psychotic features: Secondary | ICD-10-CM

## 2019-07-30 NOTE — Progress Notes (Signed)
Virtual Visit via Video Note  I connected with Gabriel [name] on 07/30/19 at 10:10 AM EDT by a video enabled telemedicine application and verified that I am speaking with the correct person using two identifiers.   I discussed the limitations of evaluation and management by telemedicine and the availability of in person appointments. The patient expressed understanding and agreed to proceed.   I provided 40 minutes of non-face-to-face time during this encounter.   [name], LCSW   THERAPIST PROGRESS NOTE  Session Time:  Friday 07/30/2019 10:10 AM - 10:50 AM   Participation Level: Active  Behavioral Response: AlertDepressed/  Type of Therapy: Individual Therapy  Treatment Goals addressed:  alleviate depressive symptoms and resume normal involvement and activity and social activity, learn and implement cognitive and behavioral strategies to overcome depression,   Interventions: CBT and Supportive  Summary: Gabriel [name] is a 44 y.o. male who is referred for services by PCP Dr. Tula Nakayama due to patient experiencing symptoms of depression. He denies any psychiatric hospitalizations, He saw therapist Youlanda Roys in [place] for a few months. Patient reports symptoms began when he lost his job about a year ago. Per his report, he was fired after he complained about race discrimination. 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 He states now feeling as though he failed his family. He states not wanting to be around anyone and having difficulty engaging with his wife and children. He fears losing his family.  He reports ruminating thoughts about job and the past, excessive worry depressed mood, tearfulness, anxiety, isolative behaviors, poor motivation, poor concentration, irritability, sleep difficulty, thoughts and feelings of hopelessness and  worthlessness. He denies any suicidal ideations.  Patient last was seen via virtual visit about 2 weeks ago. He reports no change in symptoms since last session. He continues to report thoughts and feelings of hopelessness and worthlessness but denies suicidal ideations.  He reports little to no involvement in activity.  He says he brushed his teeth and showered once since last session.   He reports he did not shower and brush his teeth other days as he forgot to do it or  just did not feel like it.  He reports continued ruminating thoughts about loss of his job and opportunities being taken away from him on his last job.  He reports continued sleep difficulty, memory difficulty, poor concentration, poor appetite, poor motivation, lack of interest in activities, depressed mood, and ruminating thoughts.  He reports starting a different medication as prescribed by psychiatrist Dr. Modesta Messing butsays he cannot tell any difference with the medication.  Suicidal/Homicidal: Nowithout intent/plan  Therapist Response:  reviewed symptoms, assisted patient identify and address thoughts and processes that inhibited implementation of patient brushing teeth and taking a shower daily, reviewed his value and reason for working on short term goals to help promote improved self-care and increased behavioral activation, assisted patient identify long range goals to help create visual for patient as to what he wants to see in the future ( taking children to a ball game and taking wife out to dinner), praised and reinforced his efforts to identify long range goals, discussed time frame to work toward this of 3- 6 months, developed plan with patient to read his vision/goal daily, developed plan with patient to shower and brush teeth daily and record  Plan: Return again in 1-2  weeks  Diagnosis: Axis I: MDD, single episode, severe    Axis II: No diagnosis    [name], LCSW 07/30/2019

## 2019-08-03 ENCOUNTER — Other Ambulatory Visit (HOSPITAL_COMMUNITY): Payer: Self-pay | Admitting: Neurology

## 2019-08-03 ENCOUNTER — Other Ambulatory Visit: Payer: Self-pay | Admitting: Neurology

## 2019-08-03 DIAGNOSIS — G8311 Monoplegia of lower limb affecting right dominant side: Secondary | ICD-10-CM | POA: Diagnosis not present

## 2019-08-03 DIAGNOSIS — R739 Hyperglycemia, unspecified: Secondary | ICD-10-CM | POA: Diagnosis not present

## 2019-08-03 DIAGNOSIS — M79604 Pain in right leg: Secondary | ICD-10-CM | POA: Diagnosis not present

## 2019-08-03 DIAGNOSIS — G629 Polyneuropathy, unspecified: Secondary | ICD-10-CM | POA: Diagnosis not present

## 2019-08-03 DIAGNOSIS — G894 Chronic pain syndrome: Secondary | ICD-10-CM | POA: Diagnosis not present

## 2019-08-05 ENCOUNTER — Ambulatory Visit: Payer: 59 | Attending: Internal Medicine

## 2019-08-05 DIAGNOSIS — Z23 Encounter for immunization: Secondary | ICD-10-CM

## 2019-08-05 NOTE — Progress Notes (Signed)
   Covid-19 Vaccination Clinic  Name:  Gabriel [name]    MRN: [id number] DOB: 12-04-1975  08/05/2019  [name] was observed post Covid-19 immunization for 15 minutes without incident. He was provided with Vaccine Information Sheet and instruction to access the V-Safe system.   [name] was instructed to call 911 with any severe reactions post vaccine: Marland Kitchen Difficulty breathing  . Swelling of face and throat  . A fast heartbeat  . A bad rash all over body  . Dizziness and weakness   Immunizations Administered    Name Date Dose VIS Date Route   Moderna COVID-19 Vaccine 08/05/2019 10:02 AM 0.5 mL 04/13/2019 Intramuscular   Manufacturer: Moderna   Lot: [id number]   NDC: 28208-138-87

## 2019-08-12 ENCOUNTER — Ambulatory Visit (INDEPENDENT_AMBULATORY_CARE_PROVIDER_SITE_OTHER): Payer: 59 | Admitting: Psychiatry

## 2019-08-12 ENCOUNTER — Other Ambulatory Visit: Payer: Self-pay

## 2019-08-12 DIAGNOSIS — F322 Major depressive disorder, single episode, severe without psychotic features: Secondary | ICD-10-CM | POA: Diagnosis not present

## 2019-08-12 MED FILL — GABAPENTIN 300 MG CAPSULE: 300 | 30 days supply | Qty: 60 | Fill #1

## 2019-08-12 NOTE — Progress Notes (Signed)
Virtual Visit via Video Note  I connected with [name] on 08/12/19 at 4:15 PM EDT by a video enabled telemedicine application and verified that I am speaking with the correct person using two identifiers.   I discussed the limitations of evaluation and management by telemedicine and the availability of in person appointments. The patient expressed understanding and agreed to proceed.  I provided 35 minutes of non-face-to-face time during this encounter.   [name], LCSW  THERAPIST PROGRESS NOTE  Session Time:  Thursday 08/12/2019 4:15 PM -  4:50 PM    Participation Level: Active  Behavioral Response: AlertDepressed/  Type of Therapy: Individual Therapy  Treatment Goals addressed:  alleviate depressive symptoms and resume normal involvement and activity and social activity, learn and implement cognitive and behavioral strategies to overcome depression,    Interventions: CBT and Supportive  Summary: Gabriel [name] is a 44 y.o. male who is referred for services by PCP Dr. Syliva Overman due to patient experiencing symptoms of depression. He denies any psychiatric hospitalizations, He saw therapist Dorann Lodge in [place] for a few months. Patient reports symptoms began when he lost his job about a year ago. Per his report, he was fired after he complained about race discrimination. 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 He states now feeling as though he failed his family. He states not wanting to be around anyone and having difficulty engaging with his wife and children. He fears losing his family.  He reports ruminating thoughts about job and the past, excessive worry depressed mood, tearfulness, anxiety, isolative behaviors, poor motivation, poor concentration, irritability, sleep difficulty, thoughts and feelings of hopelessness and  worthlessness. He denies any suicidal ideations.  Patient last was seen via virtual visit about 2 weeks ago. He reports no change in symptoms since last session. He continues to report thoughts and feelings of hopelessness and worthlessness but denies suicidal ideations.  He reports little to no involvement in activity. When asked about plan developed in last session, he says he doesn't remember writing the plan. Eventually he remembers and says he put it in a drawer. He says he forgot about plan. He says he thinks he showered and brushed his teeth twice since last session.  He reports continued ruminating thoughts about loss of his job and opportunities being taken away from him on his last job.  He reports continued sleep difficulty, memory difficulty, poor concentration, poor appetite, poor motivation, lack of interest in activities, depressed mood, and ruminating thoughts.  He also reports anger and some irritability. He continues to report medication is not helpful.   Suicidal/Homicidal: Nowithout intent/plan  Therapist Response:  reviewed symptoms, assisted patient identify and address thoughts and processes that inhibited implementation of patient brushing teeth and taking a shower daily, reviewed his value and reason for working on short term goals to help promote improved self-care and increased behavioral activation, developed plan for patient to post visual in his bathroom/read daily/shower and brush teeth daily/record  Plan: Return again in 1-2  weeks  Diagnosis: Axis I: MDD, single episode, severe    Axis II: No diagnosis    [name], LCSW 08/12/2019

## 2019-08-19 ENCOUNTER — Other Ambulatory Visit (INDEPENDENT_AMBULATORY_CARE_PROVIDER_SITE_OTHER): Payer: 59

## 2019-08-19 DIAGNOSIS — R7989 Other specified abnormal findings of blood chemistry: Secondary | ICD-10-CM | POA: Diagnosis not present

## 2019-08-19 LAB — HEPATIC FUNCTION PANEL
ALT: 47 U/L (ref 0–53)
AST: 26 U/L (ref 0–37)
Albumin: 4.3 g/dL (ref 3.5–5.2)
Alkaline Phosphatase: 98 U/L (ref 39–117)
Bilirubin, Direct: 0.1 mg/dL (ref 0.0–0.3)
Total Bilirubin: 0.5 mg/dL (ref 0.2–1.2)
Total Protein: 7 g/dL (ref 6.0–8.3)

## 2019-08-19 NOTE — Progress Notes (Addendum)
Virtual Visit via Video Note  I connected with Gabriel [name] on 08/24/19 at  1:30 PM EDT by a video enabled telemedicine application and verified that I am speaking with the correct person using two identifiers.   I discussed the limitations of evaluation and management by telemedicine and the availability of in person appointments. The patient expressed understanding and agreed to proceed.      I discussed the assessment and treatment plan with the patient. The patient was provided an opportunity to ask questions and all were answered. The patient agreed with the plan and demonstrated an understanding of the instructions.   The patient was advised to call back or seek an in-person evaluation if the symptoms worsen or if the condition fails to improve as anticipated.  I provided 20 minutes of non-face-to-face time during this encounter.   Gabriel Hotter, MD    [facility] MD/PA/NP OP Progress Note  08/24/2019 2:06 PM [name]  MRN:  [id number]  Chief Complaint:  Chief Complaint    Depression; Follow-up     HPI:  This is a follow-up appointment for depression and anxiety.  He states that he has been feeling the same.  He tends to stay in the room most of the time.  He has not changed his clothes as he does not go outside. He has insomnia. He feels anxious. He has poor appetite. He has difficulty in concentration. He denies SI. He feels anxious, tense. He is not interested in getting ECT treatment.   Ms. Durward Gabriel [name] presents to the interview with patient consent.  She states that things has been rough. She feels as if she is having three children. She takes care of his medication. He has been in the room most of the time. She tries to get him go outside. She is concerned of his medication regimen as it has been changed many times. She states that she is not a fan of ECT, and she cannot take a chance of getting procedure because of its possible side effect. She is also concerned of  financial strain, which is also a concern of [name].   After having discussion about rationale of pharmacotherapy, she agrees to adjust his medication.   Visit Diagnosis:    ICD-10-CM   1. Severe episode of recurrent major depressive disorder, without psychotic features (HCC)  F33.2 venlafaxine XR (EFFEXOR-XR) 150 MG 24 hr capsule    venlafaxine XR (EFFEXOR-XR) 75 MG 24 hr capsule  2. Anxiety  F41.9 venlafaxine XR (EFFEXOR-XR) 150 MG 24 hr capsule    venlafaxine XR (EFFEXOR-XR) 75 MG 24 hr capsule    Past Psychiatric History: Please see initial evaluation for full details. I have reviewed the history. No updates at this time.     Past Medical History:  Past Medical History:  Diagnosis Date  . Anxiety   . Carpal tunnel syndrome 2018  . Depression   . Elevated LFTs   . Heart murmur   . Insomnia   . Seasonal allergies   . Vitamin D deficiency     Past Surgical History:  Procedure Laterality Date  . NO PAST SURGERIES      Family Psychiatric History: Please see initial evaluation for full details. I have reviewed the history. No updates at this time.     Family History:  Family History  Problem Relation Age of Onset  . Diabetes Father   . Hypertension Father   . Hypertension Mother   . Hypertension Maternal Grandfather   . Diabetes  Maternal Grandfather   . Cancer Paternal Grandmother        type unknown  . Hypertension Paternal Grandmother   . Cancer Paternal Grandfather        type unknown  . Hypertension Paternal Grandfather     Social History:  Social History   Socioeconomic History  . Marital status: Married    Spouse name: Not on file  . Number of children: 2  . Years of education: 25  . Highest education level: High school graduate  Occupational History  . Occupation: not employed  Tobacco Use  . Smoking status: Never Smoker  . Smokeless tobacco: Never Used  Substance and Sexual Activity  . Alcohol use: No    Alcohol/week: 0.0 standard drinks   . Drug use: No  . Sexual activity: Yes    Birth control/protection: None  Other Topics Concern  . Not on file  Social History Narrative   He lives with wife and two children.   Unemployed at this present time   Right handed   One story home   Highest level of education:  12th grade   Social Determinants of Health   Financial Resource Strain:   . Difficulty of Paying Living Expenses:   Food Insecurity:   . Worried About Charity fundraiser in the Last Year:   . Arboriculturist in the Last Year:   Transportation Needs:   . Film/video editor (Medical):   Gabriel [name] Lack of Transportation (Non-Medical):   Physical Activity:   . Days of Exercise per Week:   . Minutes of Exercise per Session:   Stress:   . Feeling of Stress :   Social Connections:   . Frequency of Communication with Friends and Family:   . Frequency of Social Gatherings with Friends and Family:   . Attends Religious Services:   . Active Member of Clubs or Organizations:   . Attends Archivist Meetings:   Gabriel [name] Marital Status:     Allergies: No Known Allergies  Metabolic Disorder Labs: Lab Results  Component Value Date   HGBA1C 6.4 (H) 05/27/2019   MPG 137 05/27/2019   MPG 137 12/29/2018   No results found for: PROLACTIN Lab Results  Component Value Date   CHOL 195 05/27/2019   TRIG 92 05/27/2019   HDL 44 05/27/2019   CHOLHDL 4.4 05/27/2019   VLDL 10 12/04/2016   LDLCALC 131 (H) 05/27/2019   LDLCALC 128 (H) 12/29/2018   Lab Results  Component Value Date   TSH 1.93 06/08/2019   TSH 2.14 12/29/2018    Therapeutic Level Labs: No results found for: LITHIUM No results found for: VALPROATE No components found for:  CBMZ  Current Medications: Current Outpatient Medications  Medication Sig Dispense Refill  . brexpiprazole (REXULTI) 1 MG TABS tablet Take 1 tablet (1 mg total) by mouth daily. 30 tablet 0  . gabapentin (NEURONTIN) 300 MG capsule Take 1 tablet at bedtime for one week, then  increase to 1 tablet twice daily. 60 capsule 3  . hydrocortisone 2.5 % cream Apply 1 application topically as directed.    . loratadine (CLARITIN) 10 MG tablet Take 1 tablet (10 mg total) by mouth daily. 90 tablet 3  . mometasone (NASONEX) 50 MCG/ACT nasal spray Place 2 sprays into the nose daily. 34 g 2  . naproxen (NAPROSYN) 500 MG tablet as needed.    . traZODone (DESYREL) 100 MG tablet Take 2 tablets at bedtime for sleep 180 tablet 0  .  tretinoin (RETIN-A) 0.025 % cream Apply 1 application topically at bedtime.    [name] ON 09/26/2019] venlafaxine XR (EFFEXOR-XR) 150 MG 24 hr capsule 225 mg daily (75 mg +150 mg ) 90 capsule 0  . [START ON 09/26/2019] venlafaxine XR (EFFEXOR-XR) 75 MG 24 hr capsule 225 mg daily (75 mg +150 mg ) 90 capsule 0   No current facility-administered medications for this visit.     Musculoskeletal: Strength & Muscle Tone: N/A Gait & Station: N/A Patient leans: N/A  Psychiatric Specialty Exam: Review of Systems  Psychiatric/Behavioral: Positive for decreased concentration, dysphoric mood and sleep disturbance. Negative for agitation, behavioral problems, confusion, hallucinations, self-injury and suicidal ideas. The patient is nervous/anxious. The patient is not hyperactive.   All other systems reviewed and are negative.   There were no vitals taken for this visit.There is no height or weight on file to calculate BMI.  General Appearance: Fairly Groomed  Eye Contact:  Minimal  Speech:  Clear and Coherent slight increase in latency  Volume:  Normal  Mood:  Depressed  Affect:  Appropriate, Congruent, Restricted and down  Thought Process:  Coherent  Orientation:  Full (Time, Place, and Person)  Thought Content: Logical   Suicidal Thoughts:  No  Homicidal Thoughts:  No  Memory:  Immediate;   Good  Judgement:  Fair  Insight:  Present  Psychomotor Activity:  Normal  Concentration:  Concentration: Poor and Attention Span: Poor  Recall:  Good  Fund of  Knowledge: Good  Language: Good  Akathisia:  No  Handed:  Right  AIMS (if indicated): not done  Assets:  Communication Skills Desire for Improvement  ADL's:  Intact  Cognition: WNL  Sleep:  Poor   Screenings: GAD-7     Office Visit from 12/29/2018 in [facility] Office Visit from 10/28/2018 in [facility] Office Visit from 01/28/2018 in [facility] Phone Follow Up from 01/08/2018 in [facility] Phone Follow Up from 12/04/2017 in [facility]  Total GAD-7 Score  21  20  15  10  15     PHQ2-9     Counselor from [facility]10/2019 [facility] Office Visit from 05/31/2019 in [facility] Counselor from 05/18/2019 [facility] Counselor from 02/15/2019 in [facility] Office Visit from 12/29/2018 in [facility]  PHQ-2 Total Score  6  6  6  6  6   PHQ-9 Total Score  24  22  22  22  21        Assessment and Plan:  Ezell Poke is a 44 y.o. year old male with a history of depression, bilateral carpel tunnel syndrome , who presents for follow up appointment for Severe episode of recurrent major depressive disorder, without psychotic features (HCC) - Plan: venlafaxine XR (EFFEXOR-XR) 150 MG 24 hr capsule, venlafaxine XR (EFFEXOR-XR) 75 MG 24 hr capsule  Anxiety - Plan: venlafaxine XR (EFFEXOR-XR) 150 MG 24 hr capsule, venlafaxine XR (EFFEXOR-XR) 75 MG 24 hr capsule  # MDD, severe, single without psychotic features He continues to experience depressive symptoms with prominent fatigue, anhedonia, and difficulty in concentration.  Psychosocial stressors includes unemployment, and the termination of his job, which he perceives as racial discrimination.  Will uptitrate rexulti as adjunctive treatment for depression.  We will continue Effexor for depression.  Discussed at length  regarding ECT referral especially given severity of his symptoms, which are refractory to pharmacological treatment.  The patient and his wife are not interested in this treatment at this time.  Will continue to discuss as needed.   Plan 1.Continuevenlafaxine 225mg  (150 mg + 75 mg)daily 2. Increase Rexulti 1 mg daily  3. Next appointment- 5/18 at 3:40 for 30 mins, video  Past trials of medication:sertraline, fluoxetine,mirtazapine, nortriptyline for carpel tunnel syndrome,bupropion (drowsiness), quetiapine (drowsiness),Abilify ("off balance"), Trazodone, Ambien (limited benefit), lorazepam (worsening in anxiety, drowsiness)   The patient demonstrates the following risk factors for suicide: Chronic risk factors for suicide include:psychiatric disorder ofdepression. Acute risk factorsfor suicide include: unemployment and loss (financial, interpersonal, professional). Protective factorsfor this patient include: positive social support, responsibility to others (children, family) and hope for the future. Considering these factors, the overall suicide risk at this point appears to below. Patientisappropriate for outpatient follow up.   10-28-2005, MD 08/24/2019, 2:06 PM

## 2019-08-24 ENCOUNTER — Other Ambulatory Visit: Payer: Self-pay

## 2019-08-24 ENCOUNTER — Encounter (HOSPITAL_COMMUNITY): Payer: Self-pay | Admitting: Psychiatry

## 2019-08-24 ENCOUNTER — Ambulatory Visit (INDEPENDENT_AMBULATORY_CARE_PROVIDER_SITE_OTHER): Payer: 59 | Admitting: Psychiatry

## 2019-08-24 DIAGNOSIS — F419 Anxiety disorder, unspecified: Secondary | ICD-10-CM

## 2019-08-24 DIAGNOSIS — F332 Major depressive disorder, recurrent severe without psychotic features: Secondary | ICD-10-CM

## 2019-08-24 MED ORDER — VENLAFAXINE HCL ER 75 MG PO CP24: ORAL_CAPSULE | ORAL | 0 refills | Status: DC

## 2019-08-24 MED ORDER — BREXPIPRAZOLE 1 MG PO TABS: 1.0000 mg | ORAL_TABLET | Freq: Every day | ORAL | 0 refills | Status: DC

## 2019-08-24 MED ORDER — VENLAFAXINE HCL ER 150 MG PO CP24: ORAL_CAPSULE | ORAL | 0 refills | Status: DC

## 2019-08-24 MED FILL — VENLAFAXINE HCL ER 150 MG C: 150 | 90 days supply | Qty: 90 | Fill #0

## 2019-08-24 MED FILL — VENLAFAXINE HCL ER 75 MG CA: 75 | 90 days supply | Qty: 90 | Fill #0

## 2019-08-24 NOTE — Patient Instructions (Signed)
1.Continuevenlafaxine 225mg  (150 mg + 75 mg)daily 2. Increase Rexulti 1 mg daily  3. Next appointment- 5/18 at 3:40

## 2019-08-26 ENCOUNTER — Other Ambulatory Visit: Payer: Self-pay

## 2019-08-26 ENCOUNTER — Ambulatory Visit (INDEPENDENT_AMBULATORY_CARE_PROVIDER_SITE_OTHER): Payer: 59 | Admitting: Psychiatry

## 2019-08-26 DIAGNOSIS — F332 Major depressive disorder, recurrent severe without psychotic features: Secondary | ICD-10-CM

## 2019-08-26 NOTE — Progress Notes (Signed)
Virtual Visit via Video Note  I connected with [name] on 08/26/19 at 4:15 PM EDTby a video enabled telemedicine application and verified that I am speaking with the correct person using two identifiers.   I discussed the limitations of evaluation and management by telemedicine and the availability of in person appointments. The patient expressed understanding and agreed to proceed.  I provided 35 minutes of non-face-to-face time during this encounter.   [name], LCSW  THERAPIST PROGRESS NOTE  Session Time:  Thursday 08/26/2019 4:15 PM -  4:50 PM    Participation Level: Active  Behavioral Response: AlertDepressed/  Type of Therapy: Individual Therapy  Treatment Goals addressed:  alleviate depressive symptoms and resume normal involvement and activity and social activity, learn and implement cognitive and behavioral strategies to overcome depression,    Interventions: CBT and Supportive  Summary: Gabriel [name] is a 44 y.o. male who is referred for services by PCP Dr. Syliva Overman due to patient experiencing symptoms of depression. He denies any psychiatric hospitalizations, He saw therapist Dorann Lodge in [place] for a few months. Patient reports symptoms began when he lost his job about a year ago. Per his report, he was fired after he complained about race discrimination. 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 He states now feeling as though he failed his family. He states not wanting to be around anyone and having difficulty engaging with his wife and children. He fears losing his family.  He reports ruminating thoughts about job and the past, excessive worry depressed mood, tearfulness, anxiety, isolative behaviors, poor motivation, poor concentration, irritability, sleep difficulty, thoughts and feelings of hopelessness and  worthlessness. He denies any suicidal ideations.  Patient last was seen via virtual visit about 2 weeks ago. He reports no change in symptoms since last session. He continues to report thoughts and feelings of hopelessness and worthlessness but denies suicidal ideations.  He reports little to no involvement in activity.  He reports he took a shower and brush his teeth twice since last session.  He reports he did not record it.  He also reports he did not read the visual  developed last session  but did post it in his bathroom.  He reports he did not follow-up with plan because he did not feel like it and did not see the purpose.  He reports continued ruminating thoughts about loss of his job and expresses anger as he says he states he did the right thing and it did not help him.  He reports continued sleep difficulty, memory difficulty, poor concentration, poor appetite, poor motivation, lack of interest in activities, depressed mood, and ruminating thoughts.  He continues to report medication is not helpful.   Suicidal/Homicidal: Nowithout intent/plan  Therapist Response:  reviewed symptoms, assisted patient identify and address thoughts and processes that inhibited implementation of patient brushing teeth and taking a shower daily, reviewed his value and reason for working on short term goals to help promote improved self-care and increased behavioral activation, assisted patient identify opportunities to make choices that reflect his value, developed plan with patient to shower and brush teeth daily/record, facilitated patient expressing feelings regarding loss of job, validated feelings  Plan: Return again in 1-2  weeks  Diagnosis: Axis I: MDD, single episode, severe    Axis II: No diagnosis  [name], LCSW 08/26/2019

## 2019-09-02 ENCOUNTER — Ambulatory Visit: Payer: 59 | Attending: Internal Medicine

## 2019-09-02 DIAGNOSIS — Z23 Encounter for immunization: Secondary | ICD-10-CM

## 2019-09-02 DIAGNOSIS — G603 Idiopathic progressive neuropathy: Secondary | ICD-10-CM | POA: Diagnosis not present

## 2019-09-02 MED FILL — PREGABALIN 75 MG CAPS: 75 | 30 days supply | Qty: 60 | Fill #0

## 2019-09-02 NOTE — Progress Notes (Signed)
   Covid-19 Vaccination Clinic  Name:  Gabriel [name]    MRN: [id number] DOB: August 14, 1975  09/02/2019  [name] was observed post Covid-19 immunization for 15 minutes without incident. He was provided with Vaccine Information Sheet and instruction to access the V-Safe system.   [name] was instructed to call 911 with any severe reactions post vaccine: Marland Kitchen Difficulty breathing  . Swelling of face and throat  . A fast heartbeat  . A bad rash all over body  . Dizziness and weakness   Immunizations Administered    Name Date Dose VIS Date Route   Moderna COVID-19 Vaccine 09/02/2019  9:49 AM 0.5 mL 04/2019 Intramuscular   Manufacturer: Moderna   Lot: [id number]   NDC: 37793-968-86

## 2019-09-03 DIAGNOSIS — G629 Polyneuropathy, unspecified: Secondary | ICD-10-CM | POA: Diagnosis not present

## 2019-09-08 ENCOUNTER — Ambulatory Visit (HOSPITAL_COMMUNITY)
Admission: RE | Admit: 2019-09-08 | Discharge: 2019-09-08 | Disposition: A | Discharge status: Home / Self Care | Payer: 59 | Source: Ambulatory Visit | Attending: Neurology | Admitting: Neurology

## 2019-09-08 ENCOUNTER — Other Ambulatory Visit: Payer: Self-pay

## 2019-09-08 DIAGNOSIS — M546 Pain in thoracic spine: Secondary | ICD-10-CM | POA: Diagnosis not present

## 2019-09-08 DIAGNOSIS — G8311 Monoplegia of lower limb affecting right dominant side: Secondary | ICD-10-CM | POA: Diagnosis not present

## 2019-09-13 ENCOUNTER — Ambulatory Visit: Payer: 59 | Admitting: Family Medicine

## 2019-09-21 ENCOUNTER — Ambulatory Visit (INDEPENDENT_AMBULATORY_CARE_PROVIDER_SITE_OTHER): Payer: 59 | Admitting: Psychiatry

## 2019-09-21 ENCOUNTER — Other Ambulatory Visit: Payer: Self-pay

## 2019-09-21 DIAGNOSIS — F332 Major depressive disorder, recurrent severe without psychotic features: Secondary | ICD-10-CM | POA: Diagnosis not present

## 2019-09-21 NOTE — Progress Notes (Signed)
Virtual Visit via Telephone Note  I connected with Gabriel [name] on 09/21/19 at 3:15 PM EDT by telephone and verified that I am speaking with the correct person using two identifiers.   I discussed the limitations, risks, security and privacy concerns of performing an evaluation and management service by telephone and the availability of in person appointments. I also discussed with the patient that there may be a patient responsible charge related to this service. The patient expressed understanding and agreed to proceed.   I provided 45  minutes of non-face-to-face time during this encounter.   [name], LCSW  THERAPIST PROGRESS NOTE  Session Time:  Tuesday 09/21/2019 3:15 PM - 4:00 PM   Participation Level: Active  Behavioral Response: AlertDepressed/  Type of Therapy: Individual Therapy  Treatment Goals addressed:  alleviate depressive symptoms and resume normal involvement and activity and social activity, learn and implement cognitive and behavioral strategies to overcome depression,    Interventions: CBT and Supportive  Summary: Gabriel [name] is a 44 y.o. male who is referred for services by PCP Dr. Syliva Overman due to patient experiencing symptoms of depression. He denies any psychiatric hospitalizations, He saw therapist Dorann Lodge in [place] for a few months. Patient reports symptoms began when he lost his job about a year ago. Per his report, he was fired after he complained about race discrimination. 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 He states now feeling as though he failed his family. He states not wanting to be around anyone and having difficulty engaging with his wife and children. He fears losing his family.  He reports ruminating thoughts about job and the past, excessive worry depressed mood, tearfulness, anxiety,  isolative behaviors, poor motivation, poor concentration, irritability, sleep difficulty, thoughts and feelings of hopelessness and worthlessness. He denies any suicidal ideations.  Patient last was seen via virtual visit about 2 weeks ago. He continues to experience severe depression and reports no change in symptoms since last session.  He reports little to no involvement in activity.,  However he reports increased efforts regarding daily hygiene and reports taking a shower and brushing his teeth 7 times since last session.  He continues to have ruminating thoughts about the loss of his job.   He reports continued sleep difficulty, memory difficulty, poor concentration, poor appetite, poor motivation, lack of interest in activities, depressed mood, and ruminating thoughts.  He continues to report medication is not helpful.   Suicidal/Homicidal: Nowithout intent/plan  Therapist Response:  reviewed symptoms, praised and reinforced patient's increased efforts regarding daily hygiene, discussed effects, developed plan with patient to continue to improve daily hygiene by brushing teeth and taking a shower 5 times per week, assisted patient identify and address thoughts and processes that may inhibit implementation of plan, facilitated patient expressing feelings regarding job loss and the effects on his current functioning/and situation, assisted patient identify opportunities to make choices that reflect his values, discussed possibility of having consultation with the provider for ECT to explore his options and answer questions, discussed possibility of talking with his wife and Dr. Vanetta Shawl about scheduling consultation.   Plan: Return again in 1-2  weeks  Diagnosis: Axis I: MDD, single episode, severe    Axis II: No diagnosis    [name], LCSW 09/21/2019

## 2019-09-22 NOTE — Progress Notes (Signed)
Virtual Visit via Video Note  I connected with Gabriel [name] on 09/28/19 at  3:40 PM EDT by a video enabled telemedicine application and verified that I am speaking with the correct person using two identifiers.   I discussed the limitations of evaluation and management by telemedicine and the availability of in person appointments. The patient expressed understanding and agreed to proceed.     I discussed the assessment and treatment plan with the patient. The patient was provided an opportunity to ask questions and all were answered. The patient agreed with the plan and demonstrated an understanding of the instructions.   The patient was advised to call back or seek an in-person evaluation if the symptoms worsen or if the condition fails to improve as anticipated.  I provided 20 minutes of non-face-to-face time during this encounter.   Gabriel Clay, MD    [facility] MD/PA/NP OP Progress Note  09/28/2019 4:11 PM Gabriel [name]  MRN:  [id number]  Chief Complaint:  Chief Complaint    Follow-up; Depression; Anxiety     HPI:  This is a follow-up appointment for depression and anxiety.  He states that he has not noticed any change since the last visit.  However on further inquiry, he went outside the other day.  It did not make him feel better, stating that he was just outside. He may talk with his children at times. He has middle insomnia (sleeps around 9-10 pm, wakes up at 6 am).  He feels fatigue.  He has anhedonia.  He feels depressed.  He has decreased appetite; he agrees to at least sit with his family at dinner. He has difficulty in concentration.  He denies SI.  He feels anxious and tense, and restless.   Ms. Gabriel [name], the patient wife presents to the interview with the patient consent.  She states that he appears to sleep better after uptitration of rexulti. She pushes him to do more thing. She takes a walk with him on weekend. He seems to be a little relaxed, although he does not  elaborate it.  She tries to have him interact more with his children. She agreed to measure his weight before the next appointment.    Wt Readings from Last 3 Encounters:  06/08/19 221 lb 6 oz (100.4 kg)  05/31/19 215 lb (97.5 kg)  12/29/18 210 lb (95.3 kg)    Visit Diagnosis:    ICD-10-CM   1. Current moderate episode of major depressive disorder without prior episode (HCC)  F32.1 venlafaxine XR (EFFEXOR-XR) 150 MG 24 hr capsule    venlafaxine XR (EFFEXOR-XR) 75 MG 24 hr capsule  2. Anxiety  F41.9 venlafaxine XR (EFFEXOR-XR) 150 MG 24 hr capsule    venlafaxine XR (EFFEXOR-XR) 75 MG 24 hr capsule    Past Psychiatric History: Please see initial evaluation for full details. I have reviewed the history. No updates at this time.     Past Medical History:  Past Medical History:  Diagnosis Date  . Anxiety   . Carpal tunnel syndrome 2018  . Depression   . Elevated LFTs   . Heart murmur   . Insomnia   . Seasonal allergies   . Vitamin D deficiency     Past Surgical History:  Procedure Laterality Date  . NO PAST SURGERIES      Family Psychiatric History: Please see initial evaluation for full details. I have reviewed the history. No updates at this time.     Family History:  Family History  Problem Relation  Age of Onset  . Diabetes Father   . Hypertension Father   . Hypertension Mother   . Hypertension Maternal Grandfather   . Diabetes Maternal Grandfather   . Cancer Paternal Grandmother        type unknown  . Hypertension Paternal Grandmother   . Cancer Paternal Grandfather        type unknown  . Hypertension Paternal Grandfather     Social History:  Social History   Socioeconomic History  . Marital status: Married    Spouse name: Not on file  . Number of children: 2  . Years of education: 45  . Highest education level: High school graduate  Occupational History  . Occupation: not employed  Tobacco Use  . Smoking status: Never Smoker  . Smokeless tobacco:  Never Used  Substance and Sexual Activity  . Alcohol use: No    Alcohol/week: 0.0 standard drinks  . Drug use: No  . Sexual activity: Yes    Birth control/protection: None  Other Topics Concern  . Not on file  Social History Narrative   He lives with wife and two children.   Unemployed at this present time   Right handed   One story home   Highest level of education:  12th grade   Social Determinants of Health   Financial Resource Strain:   . Difficulty of Paying Living Expenses:   Food Insecurity:   . Worried About Programme researcher, broadcasting/film/video in the Last Year:   . Barista in the Last Year:   Transportation Needs:   . Freight forwarder (Medical):   Marland Kitchen Lack of Transportation (Non-Medical):   Physical Activity:   . Days of Exercise per Week:   . Minutes of Exercise per Session:   Stress:   . Feeling of Stress :   Social Connections:   . Frequency of Communication with Friends and Family:   . Frequency of Social Gatherings with Friends and Family:   . Attends Religious Services:   . Active Member of Clubs or Organizations:   . Attends Banker Meetings:   Marland Kitchen Marital Status:     Allergies: No Known Allergies  Metabolic Disorder Labs: Lab Results  Component Value Date   HGBA1C 6.4 (H) 05/27/2019   MPG 137 05/27/2019   MPG 137 12/29/2018   No results found for: PROLACTIN Lab Results  Component Value Date   CHOL 195 05/27/2019   TRIG 92 05/27/2019   HDL 44 05/27/2019   CHOLHDL 4.4 05/27/2019   VLDL 10 12/04/2016   LDLCALC 131 (H) 05/27/2019   LDLCALC 128 (H) 12/29/2018   Lab Results  Component Value Date   TSH 1.93 06/08/2019   TSH 2.14 12/29/2018    Therapeutic Level Labs: No results found for: LITHIUM No results found for: VALPROATE No components found for:  CBMZ  Current Medications: Current Outpatient Medications  Medication Sig Dispense Refill  . brexpiprazole (REXULTI) 1 MG TABS tablet Take 1 tablet (1 mg total) by mouth daily.  30 tablet 1  . gabapentin (NEURONTIN) 300 MG capsule Take 1 tablet at bedtime for one week, then increase to 1 tablet twice daily. 60 capsule 3  . hydrocortisone 2.5 % cream Apply 1 application topically as directed.    . loratadine (CLARITIN) 10 MG tablet Take 1 tablet (10 mg total) by mouth daily. 90 tablet 3  . mometasone (NASONEX) 50 MCG/ACT nasal spray Place 2 sprays into the nose daily. 34 g 2  .  naproxen (NAPROSYN) 500 MG tablet as needed.    . traZODone (DESYREL) 100 MG tablet Take 2 tablets at bedtime for sleep 180 tablet 0  . tretinoin (RETIN-A) 0.025 % cream Apply 1 application topically at bedtime.    [name] ON 10/26/2019] venlafaxine XR (EFFEXOR-XR) 150 MG 24 hr capsule 225 mg daily (75 mg +150 mg ) 90 capsule 1  . [START ON 10/26/2019] venlafaxine XR (EFFEXOR-XR) 75 MG 24 hr capsule 225 mg daily (75 mg +150 mg ) 90 capsule 1   No current facility-administered medications for this visit.     Musculoskeletal: Strength & Muscle Tone: N/A Gait & Station: N/A Patient leans: N/A  Psychiatric Specialty Exam: Review of Systems  Psychiatric/Behavioral: Positive for decreased concentration, dysphoric mood and sleep disturbance. Negative for agitation, behavioral problems, confusion, hallucinations, self-injury and suicidal ideas. The patient is nervous/anxious. The patient is not hyperactive.   All other systems reviewed and are negative.   There were no vitals taken for this visit.There is no height or weight on file to calculate BMI.  General Appearance: Fairly Groomed  Eye Contact:  Good  Speech:  Clear and Coherent  Volume:  Normal  Mood:  Depressed  Affect:  Appropriate, Congruent and Restricted  Thought Process:  Coherent  Orientation:  Full (Time, Place, and Person)  Thought Content: Logical   Suicidal Thoughts:  No  Homicidal Thoughts:  No  Memory:  Immediate;   Good  Judgement:  Good  Insight:  Fair  Psychomotor Activity:  Normal  Concentration:  Concentration:  Good and Attention Span: Good  Recall:  Good  Fund of Knowledge: Good  Language: Good  Akathisia:  No  Handed:  Right  AIMS (if indicated): not done  Assets:  Communication Skills Desire for Improvement  ADL's:  Intact  Cognition: WNL  Sleep:  Poor   Screenings: GAD-7     Office Visit from 12/29/2018 in [facility] Office Visit from 10/28/2018 in [facility] Office Visit from 01/28/2018 in [facility] Virtual [facility] Phone Follow Up from 01/08/2018 in [facility] Virtual [facility] Phone Follow Up from 12/04/2017 in [facility]  Total GAD-7 Score  21  20  15  10  15     PHQ2-9     Counsel[facility] Office Visit from 05/31/2019 in [facility] Counselor from 05/18/2019 in [facility] Counselor from 02[facility]15/2019 [facility] Office Visit from 12/29/2018 in [facility]  PHQ-2 Total Score  6  6  6  6  6   PHQ-9 Total Score  24  22  22  22  21        Assessment and Plan:  Zandyr Barnhill is a 44 y.o. 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  Psychosocial stressors includes unemployment, and the termination of his job, which he perceives as racial discrimination.   After having  discussed option of  up titration of rexulti, the patient and his wife report preference to continue current medication regimen while working on behavioral activation.  We will continue venlafaxine to target depression.  We will continue Rexulti as adjunctive treatment for depression.  Discussed potential metabolic side effect.    Plan 1.Continuevenlafaxine 225mg  (150 mg + 75 mg)daily 2. Continue Rexulti 1 mg daily  3. Next appointment- 6/29 at 3 PM for 30 mins, video - front desk to contact the patient for follow up with Ms. Bynum  Past trials of medication:sertraline, fluoxetine,mirtazapine, nortriptyline for carpel tunnel syndrome,bupropion (drowsiness),quetiapine (drowsiness),Abilify ("off balance"),Trazodone, Ambien (limited benefit),lorazepam (worsening in anxiety, drowsiness)  The patient demonstrates the following risk factors for suicide: Chronic risk factors for suicide include:psychiatric disorder ofdepression. Acute risk factorsfor suicide include: unemployment and loss (financial, interpersonal, professional). Protective factorsfor this patient include: positive social support, responsibility to others (children, family) and hope for the future. Considering these factors, the overall suicide risk at this point appears to below. Patientisappropriate for outpatient follow up.  03-09-2004, MD 09/28/2019, 4:11 PM

## 2019-09-28 ENCOUNTER — Telehealth (INDEPENDENT_AMBULATORY_CARE_PROVIDER_SITE_OTHER): Payer: 59 | Admitting: Psychiatry

## 2019-09-28 ENCOUNTER — Encounter (HOSPITAL_COMMUNITY): Payer: Self-pay | Admitting: Psychiatry

## 2019-09-28 ENCOUNTER — Other Ambulatory Visit (HOSPITAL_COMMUNITY): Payer: Self-pay | Admitting: Psychiatry

## 2019-09-28 ENCOUNTER — Other Ambulatory Visit: Payer: Self-pay

## 2019-09-28 DIAGNOSIS — F321 Major depressive disorder, single episode, moderate: Secondary | ICD-10-CM | POA: Diagnosis not present

## 2019-09-28 DIAGNOSIS — F419 Anxiety disorder, unspecified: Secondary | ICD-10-CM | POA: Diagnosis not present

## 2019-09-28 MED ORDER — BREXPIPRAZOLE 1 MG PO TABS: 1.0000 mg | ORAL_TABLET | Freq: Every day | ORAL | 1 refills | Status: DC

## 2019-09-28 MED ORDER — VENLAFAXINE HCL ER 75 MG PO CP24: ORAL_CAPSULE | ORAL | 1 refills | Status: DC

## 2019-09-28 MED ORDER — VENLAFAXINE HCL ER 150 MG PO CP24: ORAL_CAPSULE | ORAL | 1 refills | Status: DC

## 2019-09-28 MED FILL — REXULTI 1 MG TABLET: 1 | 30 days supply | Qty: 30 | Fill #0

## 2019-09-28 NOTE — Patient Instructions (Signed)
1.Continuevenlafaxine 225mg  (150 mg + 75 mg)daily 2. Continue Rexulti 1 mg daily  3. Next appointment- 6/29 at 3 PM

## 2019-09-29 DIAGNOSIS — M5416 Radiculopathy, lumbar region: Secondary | ICD-10-CM | POA: Diagnosis not present

## 2019-09-29 DIAGNOSIS — G5603 Carpal tunnel syndrome, bilateral upper limbs: Secondary | ICD-10-CM | POA: Diagnosis not present

## 2019-09-29 DIAGNOSIS — M792 Neuralgia and neuritis, unspecified: Secondary | ICD-10-CM | POA: Diagnosis not present

## 2019-09-29 DIAGNOSIS — E1142 Type 2 diabetes mellitus with diabetic polyneuropathy: Secondary | ICD-10-CM | POA: Diagnosis not present

## 2019-09-29 DIAGNOSIS — Z79899 Other long term (current) drug therapy: Secondary | ICD-10-CM | POA: Diagnosis not present

## 2019-10-01 MED FILL — PREGABALIN 150 MG CAPS: 150 | 30 days supply | Qty: 60 | Fill #0

## 2019-10-12 ENCOUNTER — Other Ambulatory Visit: Payer: Self-pay | Admitting: Family Medicine

## 2019-10-12 MED FILL — MOMETASONE FUROATE 50 MCG S: 50 | 60 days supply | Qty: 34 | Fill #0

## 2019-10-13 ENCOUNTER — Other Ambulatory Visit (HOSPITAL_COMMUNITY): Payer: Self-pay | Admitting: Dermatology

## 2019-10-13 MED FILL — HYDROCORTISONE 2.5% CREAM: 2.5 | 14 days supply | Qty: 30 | Fill #0

## 2019-10-13 MED FILL — KETOCONAZOLE 2 % SHAM: 2 | 28 days supply | Qty: 120 | Fill #0

## 2019-10-25 ENCOUNTER — Ambulatory Visit (INDEPENDENT_AMBULATORY_CARE_PROVIDER_SITE_OTHER): Payer: 59 | Admitting: Psychiatry

## 2019-10-25 ENCOUNTER — Other Ambulatory Visit: Payer: Self-pay

## 2019-10-25 DIAGNOSIS — F321 Major depressive disorder, single episode, moderate: Secondary | ICD-10-CM

## 2019-10-25 NOTE — Progress Notes (Signed)
Virtual Visit via Telephone Note  I connected with Gabriel [name] on 10/25/19 at 3:10 PM EDT  by telephone and verified that I am speaking with the correct person using two identifiers.   I discussed the limitations, risks, security and privacy concerns of performing an evaluation and management service by telephone and the availability of in person appointments. I also discussed with the patient that there may be a patient responsible charge related to this service. The patient expressed understanding and agreed to proceed.   I provided 45  minutes of non-face-to-face time during this encounter.   [name], LCSW  THERAPIST PROGRESS NOTE  Location: Patient- Home/Provider [facility] Outpatient [place] office   Session Time:  Monday 10/25/2019 3:10 PM -  3:55 PM    Participation Level: Active  Behavioral Response: AlertDepressed/  Type of Therapy: Individual Therapy  Treatment Goals addressed:  alleviate depressive symptoms and resume normal involvement and activity and social activity, learn and implement cognitive and behavioral strategies to overcome depression,    Interventions: CBT and Supportive  Summary: Gabriel [name] is a 44 y.o. male who is referred for services by PCP Dr. Syliva Overman due to patient experiencing symptoms of depression. He denies any psychiatric hospitalizations, He saw therapist Dorann Lodge in [place] for a few months. Patient reports symptoms began when he lost his job about a year ago. Per his report, he was fired after he complained about race discrimination. 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 He states now feeling as though he failed his family. He states not wanting to be around anyone and having difficulty engaging with his wife and children. He fears losing his family.  He reports ruminating thoughts about job  and the past, excessive worry depressed mood, tearfulness, anxiety, isolative behaviors, poor motivation, poor concentration, irritability, sleep difficulty, thoughts and feelings of hopelessness and worthlessness. He denies any suicidal ideations.  Patient last was seen via virtual visit about 4 weeks ago. He reports continued symptoms of depression but has been more active. He reports walking up and down his driveway about 2 times per week.  He has done this with his wife and alone.  He also reports efforts to implement plan developed in last session.  He reports brushing teeth and taking a shower 23 times since last session.  He reports continued sleep difficulty, poor appetite, and memory difficulty.  Suicidal/Homicidal: Nowithout intent/plan  Therapist Response:  reviewed symptoms, praised and reinforced patient's increased behavioral activation (walking) and efforts regarding daily hygiene, discussed effects, developed plan with patient to continue to improve daily hygiene by brushing teeth and taking a shower/increased walking to 4 times per week, also assisted patient identify other activities to pursue consistent with his values including spending time with his sons, sitting outside, and calling his mother, provided psychoeducation regarding the role of self-care in managing/overcoming depression, discussed ways to improve eating patterns   Plan: Return again in 1-2  weeks  Diagnosis: Axis I: MDD, single episode, severe    Axis II: No diagnosis    [name], LCSW 10/25/2019

## 2019-10-26 MED FILL — KETOCONAZOLE 2 % SHAM: 2 | 28 days supply | Qty: 120 | Fill #0

## 2019-10-26 MED FILL — MOMETASONE FUROATE 50 MCG S: 50 | 60 days supply | Qty: 34 | Fill #0

## 2019-10-26 MED FILL — PREGABALIN 150 MG CAPS: 150 | 30 days supply | Qty: 60 | Fill #0

## 2019-10-26 MED FILL — HYDROCORTISONE 2.5% CREAM: 2.5 | 14 days supply | Qty: 30 | Fill #0

## 2019-10-26 MED FILL — REXULTI 1 MG TABLET: 1 | 30 days supply | Qty: 30 | Fill #0

## 2019-11-04 NOTE — Progress Notes (Addendum)
Virtual Visit via Telephone Note  I connected with Gabriel [name] on 11/09/19 at  3:00 PM EDT by telephone and verified that I am speaking with the correct person using two identifiers.   I discussed the limitations, risks, security and privacy concerns of performing an evaluation and management service by telephone and the availability of in person appointments. I also discussed with the patient that there may be a patient responsible charge related to this service. The patient expressed understanding and agreed to proceed.     I discussed the assessment and treatment plan with the patient. The patient was provided an opportunity to ask questions and all were answered. The patient agreed with the plan and demonstrated an understanding of the instructions.   The patient was advised to call back or seek an in-person evaluation if the symptoms worsen or if the condition fails to improve as anticipated.  Location: patient- home, provider- home office   I provided 14 minutes of non-face-to-face time during this encounter.   [name], MD      [facility] MD/PA/NP OP Progress Note  11/09/2019 3:37 PM Gabriel [name]  MRN:  [id number]  Chief Complaint:  Chief Complaint    Follow-up; Depression     HPI:  This is a follow up appointment for depression.  He states that he went to outside with his children, and took a walk with his wife since the last visit. He did those a few times. He has been able to bath more frequently. Although he has some days with significant fatigue, he has had days with feeling more energy. Although he continues to have middle insomnia, it has become a little better. He eats three meals. He has occasional difficulty in concentration. He denies SI.   His wife, Durward Mallard presents to the interview.  She states that he has been doing a little better, although Durward Mallard needs to make him do things. She make sure that he takes medication regularly. She denies any other  concern.   BW- 218 lbs,  Original weight- 220 lbs  Visit Diagnosis:    ICD-10-CM   1. 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 I have reviewed the history. No updates at this time.     Past Medical History:  Past Medical History:  Diagnosis Date  . Anxiety   . Carpal tunnel syndrome 2018  . Depression   . Elevated LFTs   . Heart murmur   . Insomnia   . Seasonal allergies   . Vitamin D deficiency     Past Surgical History:  Procedure Laterality Date  . NO PAST SURGERIES      Family Psychiatric History: Please see initial evaluation for full details. I have reviewed the history. No updates at this time.     Family History:  Family History  Problem Relation Age of Onset  . Diabetes Father   . Hypertension Father   . Hypertension Mother   . Hypertension Maternal Grandfather   . Diabetes Maternal Grandfather   . Cancer Paternal Grandmother        type unknown  . Hypertension Paternal Grandmother   . Cancer Paternal Grandfather        type unknown  . Hypertension Paternal Grandfather     Social History:  Social History   Socioeconomic History  . Marital status: Married    Spouse name: Not on file  .  Number of children: 2  . Years of education: 5  . Highest education level: High school graduate  Occupational History  . Occupation: not employed  Tobacco Use  . Smoking status: Never Smoker  . Smokeless tobacco: Never Used  Vaping Use  . Vaping Use: Never used  Substance and Sexual Activity  . Alcohol use: No    Alcohol/week: 0.0 standard drinks  . Drug use: No  . Sexual activity: Yes    Birth control/protection: None  Other Topics Concern  . Not on file  Social History Narrative   He lives with wife and two children.   Unemployed at this present time   Right handed   One story home   Highest level of education:  12th  grade   Social Determinants of Health   Financial Resource Strain:   . Difficulty of Paying Living Expenses:   Food Insecurity:   . Worried About Charity fundraiser in the Last Year:   . Arboriculturist in the Last Year:   Transportation Needs:   . Film/video editor (Medical):   Marland Kitchen Lack of Transportation (Non-Medical):   Physical Activity:   . Days of Exercise per Week:   . Minutes of Exercise per Session:   Stress:   . Feeling of Stress :   Social Connections:   . Frequency of Communication with Friends and Family:   . Frequency of Social Gatherings with Friends and Family:   . Attends Religious Services:   . Active Member of Clubs or Organizations:   . Attends Archivist Meetings:   Marland Kitchen Marital Status:     Allergies: No Known Allergies  Metabolic Disorder Labs: Lab Results  Component Value Date   HGBA1C 6.4 (H) 05/27/2019   MPG 137 05/27/2019   MPG 137 12/29/2018   No results found for: PROLACTIN Lab Results  Component Value Date   CHOL 195 05/27/2019   TRIG 92 05/27/2019   HDL 44 05/27/2019   CHOLHDL 4.4 05/27/2019   VLDL 10 12/04/2016   LDLCALC 131 (H) 05/27/2019   LDLCALC 128 (H) 12/29/2018   Lab Results  Component Value Date   TSH 1.93 06/08/2019   TSH 2.14 12/29/2018    Therapeutic Level Labs: No results found for: LITHIUM No results found for: VALPROATE No components found for:  CBMZ  Current Medications: Current Outpatient Medications  Medication Sig Dispense Refill  . brexpiprazole (REXULTI) 2 MG TABS tablet Take 1 tablet (2 mg total) by mouth daily. 30 tablet 1  . gabapentin (NEURONTIN) 300 MG capsule Take 1 tablet at bedtime for one week, then increase to 1 tablet twice daily. 60 capsule 3  . hydrocortisone 2.5 % cream Apply 1 application topically as directed.    . loratadine (CLARITIN) 10 MG tablet Take 1 tablet (10 mg total) by mouth daily. 90 tablet 3  . mometasone (NASONEX) 50 MCG/ACT nasal spray PLACE 2 SPRAYS INTO THE  NOSE DAILY. 34 g 0  . naproxen (NAPROSYN) 500 MG tablet as needed.    . traZODone (DESYREL) 100 MG tablet Take 2 tablets at bedtime for sleep 180 tablet 0  . tretinoin (RETIN-A) 0.025 % cream Apply 1 application topically at bedtime.    Marland Kitchen venlafaxine XR (EFFEXOR-XR) 150 MG 24 hr capsule 225 mg daily (75 mg +150 mg ) 90 capsule 1  . venlafaxine XR (EFFEXOR-XR) 75 MG 24 hr capsule 225 mg daily (75 mg +150 mg ) 90 capsule 1   No current  facility-administered medications for this visit.     Musculoskeletal: Strength & Muscle Tone: N/A Gait & Station: N/A Patient leans: N/A  Psychiatric Specialty Exam: Review of Systems  Psychiatric/Behavioral: Positive for decreased concentration, dysphoric mood and sleep disturbance. Negative for agitation, behavioral problems, confusion, hallucinations, self-injury and suicidal ideas. The patient is nervous/anxious. The patient is not hyperactive.   All other systems reviewed and are negative.   There were no vitals taken for this visit.There is no height or weight on file to calculate BMI.  General Appearance: NA  Eye Contact:  NA  Speech:  Clear and Coherent  Volume:  Normal  Mood:  better  Affect:  NA  Thought Process:  Coherent  Orientation:  Full (Time, Place, and Person)  Thought Content: Logical   Suicidal Thoughts:  No  Homicidal Thoughts:  No  Memory:  Immediate;   Good  Judgement:  Good  Insight:  Fair  Psychomotor Activity:  Normal  Concentration:  Concentration: Good and Attention Span: Good  Recall:  Good  Fund of Knowledge: Good  Language: Good  Akathisia:  No  Handed:  Right  AIMS (if indicated): not done  Assets:  Communication Skills Desire for Improvement  ADL's:  Intact  Cognition: WNL  Sleep:  Poor   Screenings: GAD-7     Office Visit from 12/29/2018 in [facility] Office Visit from 10/28/2018 in [facility] Office Visit from 01/28/2018 in [facility] Virtual [facility] Phone Follow Up  from 01/08/2018 in [facility] Phone Follow Up from 12/04/2017 in [facility]  Total GAD-7 Score 21 20 15 10 15     PHQ2-9     Counsel[facility] Office Visit from 05/31/2019 in [facility] Counselor from 05/18/2019 in [facility] Counselor from 02/15/2019 in [facility] Office Visit from 12/29/2018 in [facility]  PHQ-2 Total Score 6 6 6 6 6   PHQ-9 Total Score 24 22 22 22 21        Assessment and Plan:  Gabriel [name] is a 44 y.o. year old male with a history of depression, bilateral carpel tunnel syndrome , who presents for follow up appointment for below.   1. 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 Psychosocial stressors includes unemployment,and the termination of his job,which he perceives as racial discrimination. Will do further uptitration of Rexulti to optimize treatment for depression. Discussed potential risk of metabolic side effect.  Will continue venlafaxine for depression.   Plan 1.Continuevenlafaxine 225mg  (150 mg + 75 mg)daily 2.Increase Rexulti 2mg  daily  3. Next appointment- in a month for 30 mins  Past trials of medication:sertraline, fluoxetine,mirtazapine, nortriptyline for carpel tunnel syndrome,bupropion (drowsiness),quetiapine (drowsiness),Abilify ("off balance"),Trazodone, Ambien (limited benefit),lorazepam (worsening in anxiety, drowsiness)  The patient demonstrates the following risk factors for suicide: Chronic risk factors for suicide include:psychiatric disorder ofdepression. Acute risk factorsfor suicide include: unemployment and loss (financial, interpersonal, professional). Protective factorsfor this  patient include: positive social support, responsibility to others (children, family) and hope for the future. Considering these factors, the overall suicide risk at this point appears to below. Patientisappropriate for outpatient follow up.  , MD 11/09/2019, 3:37 PM

## 2019-11-08 ENCOUNTER — Ambulatory Visit (INDEPENDENT_AMBULATORY_CARE_PROVIDER_SITE_OTHER): Payer: 59 | Admitting: Psychiatry

## 2019-11-08 ENCOUNTER — Other Ambulatory Visit: Payer: Self-pay

## 2019-11-08 DIAGNOSIS — F321 Major depressive disorder, single episode, moderate: Secondary | ICD-10-CM

## 2019-11-08 NOTE — Progress Notes (Signed)
Virtual Visit via Video Note  I connected with Gabriel [name] on 11/08/19 at  3:00 PM EDT by a video enabled telemedicine application and verified that I am speaking with the correct person using two identifiers.   I discussed the limitations of evaluation and management by telemedicine and the availability of in person appointments. The patient expressed understanding and agreed to proceed.   I provided 35 minutes of non-face-to-face time during this encounter.   Gabriel Salvage, LCSW THERAPIST PROGRESS NOTE  Location: Patient- Home/Provider [facility] Outpatient [place] office   Session Time:  Monday 11/08/2019 3:10 PM - 3:45 PM   Participation Level: Active  Behavioral Response: AlertDepressed/  Type of Therapy: Individual Therapy  Treatment Goals addressed:  alleviate depressive symptoms and resume normal involvement and activity and social activity, learn and implement cognitive and behavioral strategies to overcome depression,    Interventions: CBT and Supportive  Summary: Gabriel [name] is a 44 y.o. male who is referred for services by PCP Dr. Syliva Overman due to patient experiencing symptoms of depression. He denies any psychiatric hospitalizations, He saw therapist Dorann Lodge in [place] [facility] for a few months. Patient reports symptoms began when he lost his job about a year ago. Per his report, he was fired after he complained about race discrimination. 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 He states now feeling as though he failed his family. He states not wanting to be around anyone and having difficulty engaging with his wife and children. He fears losing his family.  He reports ruminating thoughts about job and the past, excessive worry depressed mood, tearfulness, anxiety, isolative behaviors, poor motivation, poor concentration, irritability,  sleep difficulty, thoughts and feelings of hopelessness and worthlessness. He denies any suicidal ideations.  Patient last was seen via virtual visit about 2 weeks ago. He reports his mood has been up and down. He reports continued symptoms of depression but having some good days. He reports going outside and watching his children play twice, going walking with his wife about 3 days per week, and increasing daily hygiene to several days per week. He states feeling a little more energetic. He also reports improve sleep pattern.  Suicidal/Homicidal: Nowithout intent/plan  Therapist Response:  reviewed symptoms, praised and reinforced patient's increased behavioral activation/ efforts to spend more time with his children, discussed effects on mood, provided psychoeducation on the effects of behavioral activation on coping with depression, will send handout to patient for review, assisted patient identify ways to improve daily structure and increase consistent involvement in activity with the use of daily planning, will send patient weekly schedule along with an activities handout to assist patient in planning, develop plan with patient to plan activities the night before and to check off the activities he completes, patient will bring schedule to next session  Plan: Return again in 1-2  weeks  Diagnosis: Axis I: MDD, single episode, severe    Axis II: No diagnosis    Gabriel Salvage, LCSW 11/08/2019

## 2019-11-09 ENCOUNTER — Encounter (HOSPITAL_COMMUNITY): Payer: Self-pay | Admitting: Psychiatry

## 2019-11-09 ENCOUNTER — Telehealth (INDEPENDENT_AMBULATORY_CARE_PROVIDER_SITE_OTHER): Payer: 59 | Admitting: Psychiatry

## 2019-11-09 ENCOUNTER — Other Ambulatory Visit: Payer: Self-pay

## 2019-11-09 DIAGNOSIS — F321 Major depressive disorder, single episode, moderate: Secondary | ICD-10-CM | POA: Diagnosis not present

## 2019-11-09 MED ORDER — BREXPIPRAZOLE 2 MG PO TABS: 2.0000 mg | ORAL_TABLET | Freq: Every day | ORAL | 1 refills | Status: DC

## 2019-11-09 MED FILL — REXULTI 2 MG TABLET: 2 | 30 days supply | Qty: 30 | Fill #0

## 2019-11-12 ENCOUNTER — Telehealth (HOSPITAL_COMMUNITY): Payer: Self-pay | Admitting: *Deleted

## 2019-11-12 NOTE — Telephone Encounter (Signed)
lmom for patient to call office to schedule 1 month f/u with provider with 30 min video visit.

## 2019-11-22 ENCOUNTER — Other Ambulatory Visit: Payer: Self-pay

## 2019-11-22 ENCOUNTER — Ambulatory Visit (INDEPENDENT_AMBULATORY_CARE_PROVIDER_SITE_OTHER): Payer: 59 | Admitting: Psychiatry

## 2019-11-22 DIAGNOSIS — F321 Major depressive disorder, single episode, moderate: Secondary | ICD-10-CM | POA: Diagnosis not present

## 2019-11-22 NOTE — Progress Notes (Signed)
Virtual Visit via Telephone Note  I connected with Gabriel [name] on 11/22/19 at 4:15 PM EDT by telephone and verified that I am speaking with the correct person using two identifiers.   I discussed the limitations, risks, security and privacy concerns of performing an evaluation and management service by telephone and the availability of in person appointments. I also discussed with the patient that there may be a patient responsible charge related to this service. The patient expressed understanding and agreed to proceed.   I provided 45 minutes of non-face-to-face time during this encounter.   [name], LCSW  THERAPIST PROGRESS NOTE  Location: Patient- Home/Provider [facility] Outpatient [place] office   Session Time:  Monday 11/22/2019 4:15 PM -  5:00 PM       Participation Level: Active  Behavioral Response: AlertDepressed/  Type of Therapy: Individual Therapy  Treatment Goals addressed:  alleviate depressive symptoms and resume normal involvement and activity and social activity, learn and implement cognitive and behavioral strategies to overcome depression,    Interventions: CBT and Supportive  Summary: Gabriel [name] is a 44 y.o. male who is referred for services by PCP Dr. Syliva Overman due to patient experiencing symptoms of depression. He denies any psychiatric hospitalizations, He saw therapist Dorann Lodge in [place] for a few months. Patient reports symptoms began when he lost his job about a year ago. Per his report, he was fired after he complained about race discrimination. 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 He states now feeling as though he failed his family. He states not wanting to be around anyone and having difficulty engaging with his wife and children. He fears losing his family.  He reports ruminating thoughts about job  and the past, excessive worry depressed mood, tearfulness, anxiety, isolative behaviors, poor motivation, poor concentration, irritability, sleep difficulty, thoughts and feelings of hopelessness and worthlessness. He denies any suicidal ideations.  Patient last was seen via virtual visit about 2 weeks ago. He is experiencing severe symptoms of depression including depressed mood, ruminating thoughts, poor motivation, loss of interest and pleasure in doing things, and isolated behaviors.  Per his report, he was not able to purchase his son a gaming system for his birthday.  Per his report, son was very upset, disappointment, and made negative comments to patient.  Patient now reports feelings of guilt and blame about losing his job as well as ruminating about his job.  Patient reports he has basically been in his room for the past 5 to 6 days.  He denies any suicidal ideations.    Suicidal/Homicidal: Nowithout intent/plan  Therapist Response:  reviewed symptoms, discussed stressors, facilitated expression of thoughts and feelings, validated feelings, assisted patient identify realistic expectations regarding son's response, assisted patient identify/challenge/and replace negative thoughts about self with alternative thoughts, discussed patient's values to try to link treatment outcomes and promote increased involvement with family, developed plan with patient to engage and eat dinner with family daily, developed plan with patient to review previously mailed handouts in preparation for next session Plan: Return again in 1-2  weeks  Diagnosis: Axis I: MDD, single episode, severe    Axis II: No diagnosis    [name], LCSW 11/22/2019

## 2019-11-24 DIAGNOSIS — M5416 Radiculopathy, lumbar region: Secondary | ICD-10-CM | POA: Diagnosis not present

## 2019-11-24 DIAGNOSIS — Z79899 Other long term (current) drug therapy: Secondary | ICD-10-CM | POA: Diagnosis not present

## 2019-11-24 DIAGNOSIS — E1142 Type 2 diabetes mellitus with diabetic polyneuropathy: Secondary | ICD-10-CM | POA: Diagnosis not present

## 2019-11-24 DIAGNOSIS — M792 Neuralgia and neuritis, unspecified: Secondary | ICD-10-CM | POA: Diagnosis not present

## 2019-11-24 DIAGNOSIS — G5603 Carpal tunnel syndrome, bilateral upper limbs: Secondary | ICD-10-CM | POA: Diagnosis not present

## 2019-11-24 MED FILL — REXULTI 2 MG TABLET: 2 | 30 days supply | Qty: 30 | Fill #0

## 2019-11-24 MED FILL — traMADol HCL 50 MG TABS: 50 | 30 days supply | Qty: 120 | Fill #0

## 2019-12-01 NOTE — Progress Notes (Signed)
Virtual Visit via Video Note  I connected with Gabriel [name] on 12/07/19 at  3:50 PM EDT by a video enabled telemedicine application and verified that I am speaking with the correct person using two identifiers.   I discussed the limitations of evaluation and management by telemedicine and the availability of in person appointments. The patient expressed understanding and agreed to proceed.    I discussed the assessment and treatment plan with the patient. The patient was provided an opportunity to ask questions and all were answered. The patient agreed with the plan and demonstrated an understanding of the instructions.   The patient was advised to call back or seek an in-person evaluation if the symptoms worsen or if the condition fails to improve as anticipated.  Location: patient- home, provider- home office   I provided 18 minutes of non-face-to-face time during this encounter.   [name], MD    [facility] MD/PA/NP OP Progress Note  12/07/2019 4:40 PM Gabriel [name]  MRN:  [id number]  Chief Complaint:  Chief Complaint    Depression; Follow-up     HPI:  This is a follow-up appointment for depression.  He states that he has not been able to go outside as much due to neuropathic pain in his leg.  He has been trying to have a meal together with his family.  He enjoys the time with them.  He agrees to try it more regularly to have good connection with his family.  Although he feels a little drowsy in the morning after starting rexulti, he feels comfortable continuing his medication.  Although he does have difficulty in concentration, he agrees that it has become easier for him to have conversation during this visit.  He sleeps better.  He has decreased appetite.  He has not noticed any weight change.  He feels fatigue.  He denies SI.  He feels anxious and tense.    Wt Readings from Last 3 Encounters:  06/08/19 221 lb 6 oz (100.4 kg)  05/31/19 215 lb (97.5 kg)  12/29/18 210 lb  (95.3 kg)    Visit Diagnosis:    ICD-10-CM   1. 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 I have reviewed the history. No updates at this time.     Past Medical History:  Past Medical History:  Diagnosis Date  . Anxiety   . Carpal tunnel syndrome 2018  . Depression   . Elevated LFTs   . Heart murmur   . Insomnia   . Seasonal allergies   . Vitamin D deficiency     Past Surgical History:  Procedure Laterality Date  . NO PAST SURGERIES      Family Psychiatric History: Please see initial evaluation for full details. I have reviewed the history. No updates at this time.     Family History:  Family History  Problem Relation Age of Onset  . Diabetes Father   . Hypertension Father   . Hypertension Mother   . Hypertension Maternal Grandfather   . Diabetes Maternal Grandfather   . Cancer Paternal Grandmother        type unknown  . Hypertension Paternal Grandmother   . Cancer Paternal Grandfather        type unknown  . Hypertension Paternal Grandfather     Social History:  Social History   Socioeconomic History  . Marital status: Married    Spouse  name: Not on file  . Number of children: 2  . Years of education: 80  . Highest education level: High school graduate  Occupational History  . Occupation: not employed  Tobacco Use  . Smoking status: Never Smoker  . Smokeless tobacco: Never Used  Vaping Use  . Vaping Use: Never used  Substance and Sexual Activity  . Alcohol use: No    Alcohol/week: 0.0 standard drinks  . Drug use: No  . Sexual activity: Yes    Birth control/protection: None  Other Topics Concern  . Not on file  Social History Narrative   He lives with wife and two children.   Unemployed at this present time   Right handed   One story home   Highest level of education:  12th grade   Social Determinants of Health    Financial Resource Strain:   . Difficulty of Paying Living Expenses:   Food Insecurity:   . Worried About Programme researcher, broadcasting/film/video in the Last Year:   . Barista in the Last Year:   Transportation Needs:   . Freight forwarder (Medical):   Marland Kitchen Lack of Transportation (Non-Medical):   Physical Activity:   . Days of Exercise per Week:   . Minutes of Exercise per Session:   Stress:   . Feeling of Stress :   Social Connections:   . Frequency of Communication with Friends and Family:   . Frequency of Social Gatherings with Friends and Family:   . Attends Religious Services:   . Active Member of Clubs or Organizations:   . Attends Banker Meetings:   Marland Kitchen Marital Status:     Allergies: No Known Allergies  Metabolic Disorder Labs: Lab Results  Component Value Date   HGBA1C 6.4 (H) 05/27/2019   MPG 137 05/27/2019   MPG 137 12/29/2018   No results found for: PROLACTIN Lab Results  Component Value Date   CHOL 195 05/27/2019   TRIG 92 05/27/2019   HDL 44 05/27/2019   CHOLHDL 4.4 05/27/2019   VLDL 10 12/04/2016   LDLCALC 131 (H) 05/27/2019   LDLCALC 128 (H) 12/29/2018   Lab Results  Component Value Date   TSH 1.93 06/08/2019   TSH 2.14 12/29/2018    Therapeutic Level Labs: No results found for: LITHIUM No results found for: VALPROATE No components found for:  CBMZ  Current Medications: Current Outpatient Medications  Medication Sig Dispense Refill  . traMADol (ULTRAM) 50 MG tablet Take 50 mg by mouth 2 (two) times daily.    . brexpiprazole (REXULTI) 2 MG TABS tablet Take 1 tablet (2 mg total) by mouth daily. 30 tablet 1  . gabapentin (NEURONTIN) 300 MG capsule Take 1 tablet at bedtime for one week, then increase to 1 tablet twice daily. 60 capsule 3  . hydrocortisone 2.5 % cream Apply 1 application topically as directed.    . loratadine (CLARITIN) 10 MG tablet Take 1 tablet (10 mg total) by mouth daily. 90 tablet 3  . mometasone (NASONEX) 50 MCG/ACT  nasal spray PLACE 2 SPRAYS INTO THE NOSE DAILY. 34 g 0  . naproxen (NAPROSYN) 500 MG tablet as needed.    . traZODone (DESYREL) 100 MG tablet Take 2 tablets at bedtime for sleep 180 tablet 0  . tretinoin (RETIN-A) 0.025 % cream Apply 1 application topically at bedtime.    Marland Kitchen venlafaxine XR (EFFEXOR-XR) 150 MG 24 hr capsule 225 mg daily (75 mg +150 mg ) 90 capsule 1  .  venlafaxine XR (EFFEXOR-XR) 75 MG 24 hr capsule 225 mg daily (75 mg +150 mg ) 90 capsule 1   No current facility-administered medications for this visit.     Musculoskeletal: Strength & Muscle Tone: N/A Gait & Station: N/A Patient leans: N/A  Psychiatric Specialty Exam: Review of Systems  Psychiatric/Behavioral: Positive for decreased concentration, dysphoric mood and sleep disturbance. Negative for agitation, behavioral problems, confusion, hallucinations, self-injury and suicidal ideas. The patient is nervous/anxious. The patient is not hyperactive.   All other systems reviewed and are negative.   There were no vitals taken for this visit.There is no height or weight on file to calculate BMI.  General Appearance: Fairly Groomed  Eye Contact:  Good  Speech:  Clear and Coherent  Volume:  Normal  Mood:  Depressed  Affect:  Appropriate, Congruent and Restricted  Thought Process:  Coherent  Orientation:  Full (Time, Place, and Person)  Thought Content: Logical   Suicidal Thoughts:  No  Homicidal Thoughts:  No  Memory:  Immediate;   Good  Judgement:  Good  Insight:  Fair  Psychomotor Activity:  Normal  Concentration:  Concentration: Good and Attention Span: Good  Recall:  Good  Fund of Knowledge: Good  Language: Good  Akathisia:  No  Handed:  Right  AIMS (if indicated): not done  Assets:  Communication Skills Desire for Improvement  ADL's:  Intact  Cognition: WNL  Sleep:  Fair   Screenings: GAD-7     Office Visit from 12/29/2018 in [facility] Office Visit from 10/28/2018 in [facility] Office Visit from 01/28/2018 in [facility] Virtual [facility] Phone Follow Up from 01/08/2018 in [facility] Phone Follow Up from 12/04/2017 in [facility]  Total GAD-7 Score 21 20 15 10 15     PHQ2-9     Coun[facility] Office Visit from 05/31/2019 in [facility] Counselor from 05/18/2019 in [facility] Counselor from 02/15/2019 in [facility] Office Visit from 12/29/2018 in [facility]  PHQ-2 Total Score 6 6 6 6 6   PHQ-9 Total Score 24 22 22 22 21        Assessment and Plan:  Gabriel [name] is a 44 y.o. year old male with a history of  depression,bilateral carpel tunnel syndrome, who presents for follow up appointment for below.   1. Current moderate episode of major depressive disorder without prior episode [facility]) Exam is notable for less delay in speech latency, although he still struggles with depressive symptoms.  Psychosocial stressors includes leg pain secondary to neuropathy,  unemployment, and termination of his job, which she perceives as racial discrimination.  Will continue current medication at this time to see if he has further benefit from recent up titration of rexulti.  We will continue venlafaxine to target depression.  We will continue rexulti as adjunctive treatment for depression.  Discussed potential metabolic side effect.   Plan 1.Continuevenlafaxine 225mg  (150 mg + 75 mg)daily 2.Continue Rexulti 2mg  daily  3. Next appointment-8/24 at 2:30 for 30 mins, video - on tramadol   Past trials of medication:sertraline, fluoxetine,mirtazapine, nortriptyline for carpel tunnel syndrome,bupropion (drowsiness),quetiapine (drowsiness),Abilify ("off balance"),Trazodone, Ambien (limited benefit),lorazepam (worsening in anxiety, drowsiness)  The patient  demonstrates the following risk factors for suicide: Chronic risk factors for suicide include:psychiatric disorder ofdepression. Acute risk factorsfor suicide include: unemployment and loss (financial, interpersonal, professional). Protective factorsfor this patient include: positive social support, responsibility  to others (children, family) and hope for the future. Considering these factors, the overall suicide risk at this point appears to below. Patientisappropriate for outpatient follow up.   Neysa Hottereina Shilynn Hoch, MD 12/07/2019, 4:40 PM

## 2019-12-02 MED FILL — BELBUCA 150 MCG FILM: 150 | 30 days supply | Qty: 60 | Fill #0

## 2019-12-07 ENCOUNTER — Encounter (HOSPITAL_COMMUNITY): Payer: Self-pay | Admitting: Psychiatry

## 2019-12-07 ENCOUNTER — Other Ambulatory Visit: Payer: Self-pay

## 2019-12-07 ENCOUNTER — Telehealth (INDEPENDENT_AMBULATORY_CARE_PROVIDER_SITE_OTHER): Payer: 59 | Admitting: Psychiatry

## 2019-12-07 DIAGNOSIS — F321 Major depressive disorder, single episode, moderate: Secondary | ICD-10-CM | POA: Diagnosis not present

## 2019-12-07 NOTE — Patient Instructions (Signed)
1.Continuevenlafaxine 225mg  (150 mg + 75 mg)daily 2.Continue Rexulti 2mg  daily  3. Next appointment-8/24 at 2:30

## 2019-12-08 MED FILL — VENLAFAXINE HCL ER 75 MG CA: 75 | 90 days supply | Qty: 90 | Fill #0

## 2019-12-08 MED FILL — VENLAFAXINE HCL ER 150 MG C: 150 | 90 days supply | Qty: 90 | Fill #0

## 2019-12-13 ENCOUNTER — Other Ambulatory Visit: Payer: Self-pay

## 2019-12-13 ENCOUNTER — Ambulatory Visit (INDEPENDENT_AMBULATORY_CARE_PROVIDER_SITE_OTHER): Payer: 59 | Admitting: Psychiatry

## 2019-12-13 DIAGNOSIS — F321 Major depressive disorder, single episode, moderate: Secondary | ICD-10-CM | POA: Diagnosis not present

## 2019-12-13 NOTE — Progress Notes (Signed)
Virtual Visit via Video Note  I connected with Gabriel [name] on 12/13/19 at 4:10 PM EDT by a video enabled telemedicine application and verified that I am speaking with the correct person using two identifiers.   I discussed the limitations of evaluation and management by telemedicine and the availability of in person appointments. The patient expressed understanding and agreed to proceed.   I provided 40  minutes of non-face-to-face time during this encounter.   [name], LCSW  THERAPIST PROGRESS NOTE  Location: Patient- Home/Provider [facility] [place] office   Session Time:  Monday 12/13/2019 4:10 PM -  4:50 PM   Participation Level: Active  Behavioral Response: AlertDepressed/  Type of Therapy: Individual Therapy  Treatment Goals addressed:  alleviate depressive symptoms and resume normal involvement and activity and social activity, learn and implement cognitive and behavioral strategies to overcome depression,    Interventions: CBT and Supportive  Summary: Gabriel [name] is a 44 y.o. male who is referred for services by PCP Dr. Syliva Overman due to patient experiencing symptoms of depression. He denies any psychiatric hospitalizations, He saw therapist Dorann Lodge in [place] for a few months. Patient reports symptoms began when he lost his job about a year ago. Per his report, he was fired after he complained about race discrimination. 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 He states now feeling as though he failed his family. He states not wanting to be around anyone and having difficulty engaging with his wife and children. He fears losing his family.  He reports ruminating thoughts about job and the past, excessive worry depressed mood, tearfulness, anxiety, isolative behaviors, poor motivation, poor concentration, irritability,  sleep difficulty, thoughts and feelings of hopelessness and worthlessness. He denies any suicidal ideations.  Patient last was seen via virtual visit about 3 weeks ago. He experiences moderate symptoms of depression including depressed mood, ruminating thoughts, and poor motivation. He reports increased sleep difficulty due to neuropathy in right leg and foot. This has decreased his mobility. He has made efforts to engage more with his family and has been eating dinner with family more frequently. He also reports improving daily hygiene and taking showers more frequently. He reports increased stress and worry about family finances. He says his wife handles everything and they haven't talked about their finances. He states wanting to talk to her about the finances but also expresses ambivalent feelings about doing so. He  expresses guilt about not being able to contribute financially to the family.  He denies any suicidal ideations.    Suicidal/Homicidal: Nowithout intent/plan  Therapist Response:  reviewed symptoms, praised and reinforced patient's efforts to engage with family and improve daily hygiene, discussed effects, developed plan with patient to continue efforts to increase frequency, discussed stressors, facilitated expression of thoughts and feelings, validated feelings, assisted patient identify pros and cons of talking with wife about finances,   Plan: Return again in 1-2  weeks  Diagnosis: Axis I: MDD, single episode, severe    Axis II: No diagnosis    [name], LCSW 12/13/2019

## 2019-12-22 DIAGNOSIS — G5603 Carpal tunnel syndrome, bilateral upper limbs: Secondary | ICD-10-CM | POA: Diagnosis not present

## 2019-12-22 DIAGNOSIS — M5416 Radiculopathy, lumbar region: Secondary | ICD-10-CM | POA: Diagnosis not present

## 2019-12-22 DIAGNOSIS — E1142 Type 2 diabetes mellitus with diabetic polyneuropathy: Secondary | ICD-10-CM | POA: Diagnosis not present

## 2019-12-22 DIAGNOSIS — M792 Neuralgia and neuritis, unspecified: Secondary | ICD-10-CM | POA: Diagnosis not present

## 2019-12-22 DIAGNOSIS — Z79899 Other long term (current) drug therapy: Secondary | ICD-10-CM | POA: Diagnosis not present

## 2019-12-22 MED FILL — PREGABALIN 75 MG CAPS: 75 | 30 days supply | Qty: 60 | Fill #0

## 2019-12-22 MED FILL — BELBUCA 300 MCG FILM: 300 | 30 days supply | Qty: 60 | Fill #0

## 2019-12-27 ENCOUNTER — Ambulatory Visit (INDEPENDENT_AMBULATORY_CARE_PROVIDER_SITE_OTHER): Payer: 59 | Admitting: Psychiatry

## 2019-12-27 ENCOUNTER — Other Ambulatory Visit: Payer: Self-pay

## 2019-12-27 DIAGNOSIS — F321 Major depressive disorder, single episode, moderate: Secondary | ICD-10-CM | POA: Diagnosis not present

## 2019-12-27 DIAGNOSIS — F419 Anxiety disorder, unspecified: Secondary | ICD-10-CM | POA: Diagnosis not present

## 2019-12-27 NOTE — Progress Notes (Signed)
Virtual Visit via Video Note  I connected with [name] on 12/27/19 at 4:12 PM EDT by a video enabled telemedicine application and verified that I am speaking with the correct person using two identifiers.   I discussed the limitations of evaluation and management by telemedicine and the availability of in person appointments. The patient expressed understanding and agreed to proceed.  I provided 48 minutes of non-face-to-face time during this encounter.   [name], LCSW  THERAPIST PROGRESS NOTE  Location: Patient- Home/Provider [facility] [place] office   Session Time:  Monday 12/27/2019 4:12 PM -5:00 PM  Participation Level: Active  Behavioral Response: AlertDepressed/  Type of Therapy: Individual Therapy  Treatment Goals addressed:  alleviate depressive symptoms and resume normal involvement and activity and social activity, learn and implement cognitive and behavioral strategies to overcome depression,    Interventions: CBT and Supportive  Summary: Gabriel [name] is a 44 y.o. male who is referred for services by PCP Dr. Syliva Overman due to patient experiencing symptoms of depression. He denies any psychiatric hospitalizations, He saw therapist Dorann Lodge in [place] for a few months. Patient reports symptoms began when he lost his job about a year ago. Per his report, he was fired after he complained about race discrimination. 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 He states now feeling as though he failed his family. He states not wanting to be around anyone and having difficulty engaging with his wife and children. He fears losing his family.  He reports ruminating thoughts about job and the past, excessive worry depressed mood, tearfulness, anxiety, isolative behaviors, poor motivation, poor concentration, irritability,  sleep difficulty, thoughts and feelings of hopelessness and worthlessness. He denies any suicidal ideations.  Patient last was seen via virtual visit about 2-3 weeks ago. He experiences moderate symptoms of depression including depressed mood, ruminating thoughts, and poor motivation. He reports continued sleep difficulty due to neuropathy in right leg and foot. He states eating with his family a couple of times and showering a couple of times since last session. He he reports deciding against talking with his wife about finances as he is afraid he would burden her by bringing up the subject.  He reports he is not engaging with family as he continues to have thoughts of having failed his family.  He states feeling safer and more comfortable isolating self.  He denies any suicidal ideations.    Suicidal/Homicidal: Nowithout intent/plan  Therapist Response:  reviewed symptoms, discussed stressors, facilitated expression of thoughts and feelings, validated feelings, assisted patient identify his values and connection to family to try to assist patient improve interaction and engagement with family, used cognitive defusion to try to help patient cope with ruminating thoughts, develop plan with patient to eat dinner with his family 3-4 times a week,  Plan: Return again in 1-2  weeks  Diagnosis: Axis I: MDD, single episode, severe    Axis II: No diagnosis    [name], LCSW 12/27/2019

## 2020-01-03 NOTE — Progress Notes (Signed)
Virtual Visit via Video Note  I connected with Gabriel [name] on 01/06/20 at  4:00 PM EDT by a video enabled telemedicine application and verified that I am speaking with the correct person using two identifiers.   I discussed the limitations of evaluation and management by telemedicine and the availability of in person appointments. The patient expressed understanding and agreed to proceed.   I discussed the assessment and treatment plan with the patient. The patient was provided an opportunity to ask questions and all were answered. The patient agreed with the plan and demonstrated an understanding of the instructions.   The patient was advised to call back or seek an in-person evaluation if the symptoms worsen or if the condition fails to improve as anticipated.  Location: patient- home, provider- office   I provided 15 minutes of non-face-to-face time during this encounter.   Neysa Hottereina Reeves Musick, MD    [facility] OP Progress Note  01/06/2020 4:33 PM Gabriel [name]  MRN:  [id number]  Chief Complaint:  Chief Complaint    Follow-up; Depression     HPI:  This is a follow-up appointment for depression.  He states that he is by himself most of the time at home.  When he is asked, he agrees that he spent with his family for a few months at dinner.  He has not been able to take a walk due to pain.  He also has had difficulty staying sit down, or standing up due to leg pain. He is concerned about financial strain, and feels that his wife is taking care of everything. He states that although he tried to help her in the past, he ended up putting ice cream in pantry, and the result was messy. He agrees to try to clean the room, and goes outside more frequently to have fresh air. He has worsening in initial insomnia, although he used to be sleeping better.  He feels fatigued.  He has difficulty in concentration.  He has poor appetite, and has lost some weight.  He has anhedonia.  He denies SI.   203 lbs Wt Readings from Last 3 Encounters:  06/08/19 221 lb 6 oz (100.4 kg)  05/31/19 215 lb (97.5 kg)  12/29/18 210 lb (95.3 kg)    Visit Diagnosis:    ICD-10-CM   1. 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 I have reviewed the history. No updates at this time.     Past Medical History:  Past Medical History:  Diagnosis Date  . Anxiety   . Carpal tunnel syndrome 2018  . Depression   . Elevated LFTs   . Heart murmur   . Insomnia   . Seasonal allergies   . Vitamin D deficiency     Past Surgical History:  Procedure Laterality Date  . NO PAST SURGERIES      Family Psychiatric History: Please see initial evaluation for full details. I have reviewed the history. No updates at this time.     Family History:  Family History  Problem Relation Age of Onset  . Diabetes Father   . Hypertension Father   . Hypertension Mother   . Hypertension Maternal Grandfather   . Diabetes Maternal Grandfather   . Cancer Paternal Grandmother        type unknown  . Hypertension Paternal Grandmother   . Cancer Paternal Grandfather  type unknown  . Hypertension Paternal Grandfather     Social History:  Social History   Socioeconomic History  . Marital status: Married    Spouse name: Not on file  . Number of children: 2  . Years of education: 80  . Highest education level: High school graduate  Occupational History  . Occupation: not employed  Tobacco Use  . Smoking status: Never Smoker  . Smokeless tobacco: Never Used  Vaping Use  . Vaping Use: Never used  Substance and Sexual Activity  . Alcohol use: No    Alcohol/week: 0.0 standard drinks  . Drug use: No  . Sexual activity: Yes    Birth control/protection: None  Other Topics Concern  . Not on file  Social History Narrative   He lives with wife and two children.   Unemployed at this  present time   Right handed   One story home   Highest level of education:  12th grade   Social Determinants of Health   Financial Resource Strain:   . Difficulty of Paying Living Expenses: Not on file  Food Insecurity:   . Worried About Programme researcher, broadcasting/film/video in the Last Year: Not on file  . Ran Out of Food in the Last Year: Not on file  Transportation Needs:   . Lack of Transportation (Medical): Not on file  . Lack of Transportation (Non-Medical): Not on file  Physical Activity:   . Days of Exercise per Week: Not on file  . Minutes of Exercise per Session: Not on file  Stress:   . Feeling of Stress : Not on file  Social Connections:   . Frequency of Communication with Friends and Family: Not on file  . Frequency of Social Gatherings with Friends and Family: Not on file  . Attends Religious Services: Not on file  . Active Member of Clubs or Organizations: Not on file  . Attends Banker Meetings: Not on file  . Marital Status: Not on file    Allergies: No Known Allergies  Metabolic Disorder Labs: Lab Results  Component Value Date   HGBA1C 6.4 (H) 05/27/2019   MPG 137 05/27/2019   MPG 137 12/29/2018   No results found for: PROLACTIN Lab Results  Component Value Date   CHOL 195 05/27/2019   TRIG 92 05/27/2019   HDL 44 05/27/2019   CHOLHDL 4.4 05/27/2019   VLDL 10 12/04/2016   LDLCALC 131 (H) 05/27/2019   LDLCALC 128 (H) 12/29/2018   Lab Results  Component Value Date   TSH 1.93 06/08/2019   TSH 2.14 12/29/2018    Therapeutic Level Labs: No results found for: LITHIUM No results found for: VALPROATE No components found for:  CBMZ  Current Medications: Current Outpatient Medications  Medication Sig Dispense Refill  . Brexpiprazole (REXULTI) 0.5 MG TABS Total of 2.5 mg daily, along with 2 mg tab 30 tablet 1  . brexpiprazole (REXULTI) 2 MG TABS tablet Total of 2.5 mg daily, along with 0.5 mg tab 30 tablet 1  . gabapentin (NEURONTIN) 300 MG capsule  Take 1 tablet at bedtime for one week, then increase to 1 tablet twice daily. 60 capsule 3  . hydrocortisone 2.5 % cream Apply 1 application topically as directed.    . loratadine (CLARITIN) 10 MG tablet Take 1 tablet (10 mg total) by mouth daily. 90 tablet 3  . mometasone (NASONEX) 50 MCG/ACT nasal spray PLACE 2 SPRAYS INTO THE NOSE DAILY. 34 g 0  . naproxen (NAPROSYN) 500  MG tablet as needed.    . traMADol (ULTRAM) 50 MG tablet Take 50 mg by mouth 2 (two) times daily.    . traZODone (DESYREL) 100 MG tablet Take 2 tablets at bedtime for sleep 180 tablet 0  . tretinoin (RETIN-A) 0.025 % cream Apply 1 application topically at bedtime.    Marland Kitchen venlafaxine XR (EFFEXOR-XR) 150 MG 24 hr capsule 225 mg daily (75 mg +150 mg ) 90 capsule 1  . venlafaxine XR (EFFEXOR-XR) 75 MG 24 hr capsule 225 mg daily (75 mg +150 mg ) 90 capsule 1   No current facility-administered medications for this visit.     Musculoskeletal: Strength & Muscle Tone: N/A Gait & Station: N/A Patient leans: N/A  Psychiatric Specialty Exam: Review of Systems  Psychiatric/Behavioral: Positive for decreased concentration, dysphoric mood and sleep disturbance. Negative for agitation, behavioral problems, confusion, hallucinations, self-injury and suicidal ideas. The patient is nervous/anxious. The patient is not hyperactive.   All other systems reviewed and are negative.   There were no vitals taken for this visit.There is no height or weight on file to calculate BMI.  General Appearance: Fairly Groomed  Eye Contact:  Good  Speech:  Clear and Coherent  Volume:  Normal  Mood:  Depressed  Affect:  Appropriate, Congruent and Restricted  Thought Process:  Coherent  Orientation:  Full (Time, Place, and Person)  Thought Content: Logical   Suicidal Thoughts:  No  Homicidal Thoughts:  No  Memory:  Immediate;   Good  Judgement:  Good  Insight:  Fair  Psychomotor Activity:  Normal  Concentration:  Concentration: Good and Attention  Span: Good  Recall:  Good  Fund of Knowledge: Good  Language: Good  Akathisia:  No  Handed:  Right  AIMS (if indicated): not done  Assets:  Communication Skills Desire for Improvement  ADL's:  Intact  Cognition: WNL  Sleep:  Poor   Screenings: GAD-7     Office Visit from 12/29/2018 in [facility] Office Visit from 10/28/2018 in [facility] Office Visit from 01/28/2018 in [facility] Phone Follow Up from 01/08/2018 in [facility] Phone Follow Up from 12/04/2017 in [facility]  Total GAD-7 Score 21 20 15 10 15     PHQ2-9     Coun[facility] Office Visit from 05/31/2019 in [facility] Counselor from 05/18/2019 in [facility] Counselor from 02/15/2019 in [facility] Office Visit from 12/29/2018 in [facility]  PHQ-2 Total Score 6 6 6 6 6   PHQ-9 Total Score 24 22 22 22 21        Assessment and Plan:  Jessi Jessop is a 44 y.o. year old male with a history of  depression,bilateral carpel tunnel syndrome, who presents for follow up appointment for below.   .1. Current moderate episode of major depressive disorder without prior episode [facility]) Exam is notable for impaired attention, and he continues to report depressive symptoms since the last visit. Psychosocial stressors includes leg pain secondary to neuropathy,  unemployment, and termination of his job, which she perceives as racial discrimination.   Will uptitrate rexulti as adjunctive treatment for depression.  Discussed potential metabolic side effect and EPS.  Coached behavioral activation.   Plan 1.Continuevenlafaxine 225mg  (150 mg + 75 mg)daily 2.Increase Rexulti 2.5mg  daily  3. Next appointment-10/14 at 11 AM for 30 mins, video - on tramadol   Past trials of  medication:sertraline, fluoxetine,mirtazapine, nortriptyline for carpel tunnel syndrome,bupropion (drowsiness),quetiapine (drowsiness),Abilify ("off balance"),Trazodone, Ambien (limited benefit),lorazepam (worsening in anxiety, drowsiness)  The patient demonstrates the following risk factors for suicide: Chronic risk factors for suicide include:psychiatric disorder ofdepression. Acute risk factorsfor suicide include: unemployment and loss (financial, interpersonal, professional). Protective factorsfor this patient include: positive social support, responsibility to others (children, family) and hope for the future. Considering these factors, the overall suicide risk at this point appears to below. Patientisappropriate for outpatient follow up.    [name], MD 01/06/2020, 4:33 PM

## 2020-01-04 ENCOUNTER — Telehealth (HOSPITAL_COMMUNITY): Payer: 59 | Admitting: Psychiatry

## 2020-01-05 MED FILL — BELBUCA 300 MCG FILM: 300 | 30 days supply | Qty: 60 | Fill #0

## 2020-01-06 ENCOUNTER — Telehealth (INDEPENDENT_AMBULATORY_CARE_PROVIDER_SITE_OTHER): Payer: 59 | Admitting: Psychiatry

## 2020-01-06 ENCOUNTER — Other Ambulatory Visit: Payer: Self-pay

## 2020-01-06 ENCOUNTER — Encounter (HOSPITAL_COMMUNITY): Payer: Self-pay | Admitting: Psychiatry

## 2020-01-06 DIAGNOSIS — F321 Major depressive disorder, single episode, moderate: Secondary | ICD-10-CM

## 2020-01-06 MED ORDER — BREXPIPRAZOLE 2 MG PO TABS: ORAL_TABLET | ORAL | 1 refills | Status: DC

## 2020-01-06 MED ORDER — REXULTI 0.5 MG PO TABS: ORAL_TABLET | ORAL | 1 refills | Status: DC

## 2020-01-06 MED FILL — REXULTI 2 MG TABLET: 2 | 30 days supply | Qty: 30 | Fill #0

## 2020-01-06 MED FILL — REXULTI 0.5 MG TABLET: 0.5 | 30 days supply | Qty: 30 | Fill #0

## 2020-01-06 NOTE — Patient Instructions (Signed)
1.Continuevenlafaxine 225mg  (150 mg + 75 mg)daily 2.Increase Rexulti 2.5mg  daily  3. Next appointment-10/14 at 11 AM

## 2020-01-25 ENCOUNTER — Ambulatory Visit (HOSPITAL_COMMUNITY): Payer: 59 | Admitting: Psychiatry

## 2020-01-26 ENCOUNTER — Ambulatory Visit (INDEPENDENT_AMBULATORY_CARE_PROVIDER_SITE_OTHER): Payer: 59 | Admitting: Psychiatry

## 2020-01-26 ENCOUNTER — Other Ambulatory Visit: Payer: Self-pay

## 2020-01-26 DIAGNOSIS — F321 Major depressive disorder, single episode, moderate: Secondary | ICD-10-CM | POA: Diagnosis not present

## 2020-01-26 NOTE — Progress Notes (Signed)
Virtual Visit via Video Note  I connected with Gabriel [name] on 01/26/20 at 8:42 AM EDT  by a video enabled telemedicine application and verified that I am speaking with the correct person using two identifiers.   I discussed the limitations of evaluation and management by telemedicine and the availability of in person appointments. The patient expressed understanding and agreed to proceed.  I provided 18 minutes of non-face-to-face time during this encounter.   [name], LCSW  THERAPIST PROGRESS NOTE  Location: Patient- Home/Provider [facility] [place] office   Session Time: Wednesday 01/26/2020 8:42 AM -  9:00 AM   Participation Level: Active  Behavioral Response: AlertDepressed/  Type of Therapy: Individual Therapy  Treatment Goals addressed:  alleviate depressive symptoms and resume normal involvement and activity and social activity, learn and implement cognitive and behavioral strategies to overcome depression,    Interventions: CBT and Supportive  Summary: Gabriel [name] is a 44 y.o. male who is referred for services by PCP Dr. Syliva Overman due to patient experiencing symptoms of depression. He denies any psychiatric hospitalizations, He saw therapist Dorann Lodge in [place] for a few months. Patient reports symptoms began when he lost his job about a year ago. Per his report, he was fired after he complained about race discrimination. 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 He states now feeling as though he failed his family. He states not wanting to be around anyone and having difficulty engaging with his wife and children. He fears losing his family.  He reports ruminating thoughts about job and the past, excessive worry depressed mood, tearfulness, anxiety, isolative behaviors, poor motivation, poor concentration,  irritability, sleep difficulty, thoughts and feelings of hopelessness and worthlessness. He denies any suicidal ideations.  Patient last was seen via virtual visit about 2-3 weeks ago. He experiences moderate symptoms of depression including depressed mood, fatigue,memory difficulty, ruminating thoughts, and poor motivation. He reports continued sleep difficulty due to neuropathy in right leg and foot. He reports remembering going out with his wife once since last session but states he cannot remember where they went or what they did.  He states eating with his family a couple of times per week since last session.  He states talking some during that time and reports he thinks he felt better after interaction with them. Suicidal/Homicidal: Nowithout intent/plan  Therapist Response:  reviewed symptoms, praised and reinforced patient's efforts to eat dinner and interact with family, assisted patient acting effects on his mood, developed plan with patient to increase interaction with family to eating dinner with them 4 times per week, discussed and obtained patient's permission to include wife in next session to facilitate collaboration and support for patient   Plan: Return again in 1-2  weeks  Diagnosis: Axis I: MDD, single episode, severe    Axis II: No diagnosis    [name], LCSW 01/26/2020

## 2020-01-31 ENCOUNTER — Other Ambulatory Visit: Payer: Self-pay

## 2020-01-31 ENCOUNTER — Encounter: Payer: Self-pay | Admitting: Family Medicine

## 2020-01-31 ENCOUNTER — Ambulatory Visit (INDEPENDENT_AMBULATORY_CARE_PROVIDER_SITE_OTHER): Payer: 59 | Admitting: Family Medicine

## 2020-01-31 VITALS — BP 117/74 | HR 68 | Resp 16 | Ht 71.5 in | Wt 205.0 lb

## 2020-01-31 DIAGNOSIS — Z0000 Encounter for general adult medical examination without abnormal findings: Secondary | ICD-10-CM | POA: Diagnosis not present

## 2020-01-31 DIAGNOSIS — R7302 Impaired glucose tolerance (oral): Secondary | ICD-10-CM

## 2020-01-31 DIAGNOSIS — E669 Obesity, unspecified: Secondary | ICD-10-CM | POA: Diagnosis not present

## 2020-01-31 DIAGNOSIS — Z23 Encounter for immunization: Secondary | ICD-10-CM | POA: Diagnosis not present

## 2020-01-31 DIAGNOSIS — R7303 Prediabetes: Secondary | ICD-10-CM | POA: Diagnosis not present

## 2020-01-31 DIAGNOSIS — E559 Vitamin D deficiency, unspecified: Secondary | ICD-10-CM | POA: Diagnosis not present

## 2020-01-31 DIAGNOSIS — Z125 Encounter for screening for malignant neoplasm of prostate: Secondary | ICD-10-CM | POA: Diagnosis not present

## 2020-01-31 LAB — POCT GLYCOSYLATED HEMOGLOBIN (HGB A1C): Hemoglobin A1C: 6 % — AB (ref 4.0–5.6)

## 2020-01-31 MED ORDER — MOMETASONE FUROATE 50 MCG/ACT NA SUSP: 2.0000 | Freq: Every day | NASAL | 1 refills | Status: DC

## 2020-01-31 MED FILL — MOMETASONE FUROATE 50 MCG S: 50 | 90 days supply | Qty: 51 | Fill #0

## 2020-01-31 NOTE — Patient Instructions (Signed)
F/u with MD in 5.5 months, call if you need me before  Flu vaccine today  Visual acuity test in office today  GLycoHB today in office  PSA and vit d level   Anxiety is slightly better, keep working with your Doctors, and remind yourself how much you are loved by your family every day  Careful not to fall due to weakness of right leg  Think about what you will eat, plan ahead. Choose " clean, green, fresh or frozen" over canned, processed or packaged foods which are more sugary, salty and fatty. 70 to 75% of food eaten should be vegetables and fruit. Three meals at set times with snacks allowed between meals, but they must be fruit or vegetables. Aim to eat over a 12 hour period , example 7 am to 7 pm, and STOP after  your last meal of the day. Drink water,generally about 64 ounces per day, no other drink is as healthy. Fruit juice is best enjoyed in a healthy way, by EATING the fruit. Congrats on weight loss  Thanks for choosing [facility], we consider it a privelige to serve you.

## 2020-01-31 NOTE — Assessment & Plan Note (Signed)
Patient educated about the importance of limiting  Carbohydrate intake , the need to commit to daily physical activity for a minimum of 30 minutes , and to commit weight loss. The fact that changes in all these areas will reduce or eliminate all together the development of diabetes is stressed.   Diabetic Labs Latest Ref Rng & Units 06/08/2019 05/27/2019 12/29/2018 02/18/2018 12/04/2016  HbA1c <5.7 % of total Hgb - 6.4(H) 6.4(H) - 5.9(H)  Chol <200 mg/dL - 827 078 675 449  HDL > OR = 40 mg/dL - 44 20(F) 00(F) 56  Calc LDL mg/dL (calc) - 121(F) 758(I) 108(H) 98  Triglycerides <150 mg/dL - 92 72 61 49  Creatinine 0.40 - 1.50 mg/dL 3.25 4.98 2.64 1.58 3.09  GFR >60.00 mL/min 87.22 - - - -   BP/Weight 01/31/2020 06/08/2019 05/31/2019 12/29/2018 12/18/2018 10/28/2018 08/12/2018  Systolic BP 117 114 112 112 - 407 -  Diastolic BP 74 80 80 80 - 70 -  Wt. (Lbs) 205 221.38 215 210 200 218.04 209  BMI 28.19 30.45 29.57 28.48 27.12 29.57 28.35  Some encounter information is confidential and restricted. Go to Review Flowsheets activity to see all data.   No flowsheet data found.

## 2020-01-31 NOTE — Assessment & Plan Note (Signed)

## 2020-01-31 NOTE — Progress Notes (Signed)
Gabriel [name]     MRN: [id number]      DOB: May 22, 1975   HPI: Patient is in for annual physical exam. No other health concerns are expressed or addressed at the visit. Recent labs, if available are reviewed. Immunization is reviewed , and  updated     PE; BP 117/74   Pulse 68   Resp 16   Ht 5' 11.5" (1.816 m)   Wt 205 lb (93 kg)   SpO2 97%   BMI 28.19 kg/m   Pleasant male, alert and oriented x 3, in no cardio-pulmonary distress. Afebrile. HEENT No facial trauma or asymetry. Sinuses non tender. EOMI External ears normal,  Neck: supple, no adenopathy,JVD or thyromegaly.No bruits.  Chest: Clear to ascultation bilaterally.No crackles or wheezes. Non tender to palpation  Cardiovascular system; Heart sounds normal,  S1 and  S2 ,no S3.  No murmur, or thrill. Apical beat not displaced Peripheral pulses normal.  Abdomen: Soft, non tender, no organomegaly or masses. No bruits. Bowel sounds normal. No guarding, tenderness or rebound.    Musculoskeletal exam:Decreased though adequate  ROM of spine, normal in hips , shoulders and knees. No deformity ,swelling or crepitus noted. No muscle wasting or atrophy.   Neurologic: Cranial nerves 2 to 12 intact. Power, tone ,sensation and reflexes normal throughout. No disturbance in gait. No tremor.  Skin: Intact, no ulceration, erythema , scaling or rash noted. Pigmentation normal throughout  Psych; Depressed  mood and affect. Judgement and concentration normal   Assessment & Plan:  Annual physical exam Annual exam as documented. Counseling done  re healthy lifestyle involving commitment to 150 minutes exercise per week, heart healthy diet, and attaining healthy weight.The importance of adequate sleep also discussed. Regular seat belt use and home safety, is also discussed. Changes in health habits are decided on by the patient with goals and time frames  set for achieving them. Immunization and cancer screening  needs are specifically addressed at this visit.   Prediabetes Patient educated about the importance of limiting  Carbohydrate intake , the need to commit to daily physical activity for a minimum of 30 minutes , and to commit weight loss. The fact that changes in all these areas will reduce or eliminate all together the development of diabetes is stressed.   Diabetic Labs Latest Ref Rng & Units 06/08/2019 05/27/2019 12/29/2018 02/18/2018 12/04/2016  HbA1c <5.7 % of total Hgb - 6.4(H) 6.4(H) - 5.9(H)  Chol <200 mg/dL - 892 119 417 408  HDL > OR = 40 mg/dL - 44 14(G) 81(E) 56  Calc LDL mg/dL (calc) - 563(J) 497(W) 108(H) 98  Triglycerides <150 mg/dL - 92 72 61 49  Creatinine 0.40 - 1.50 mg/dL 2.63 7.85 8.85 0.27 7.41  GFR >60.00 mL/min 87.22 - - - -   BP/Weight 01/31/2020 06/08/2019 05/31/2019 12/29/2018 12/18/2018 10/28/2018 08/12/2018  Systolic BP 117 114 112 112 - 287 -  Diastolic BP 74 80 80 80 - 70 -  Wt. (Lbs) 205 221.38 215 210 200 218.04 209  BMI 28.19 30.45 29.57 28.48 27.12 29.57 28.35  Some encounter information is confidential and restricted. Go to Review Flowsheets activity to see all data.   No flowsheet data found.    IGT (impaired glucose tolerance) Improved Patient educated about the importance of limiting  Carbohydrate intake , the need to commit to daily physical activity for a minimum of 30 minutes , and to commit weight loss. The fact that changes in all these areas will reduce or  eliminate all together the development of diabetes is stressed.   Diabetic Labs Latest Ref Rng & Units 01/31/2020 06/08/2019 05/27/2019 12/29/2018 02/18/2018  HbA1c 4.0 - 5.6 % 6.0(A) - 6.4(H) 6.4(H) -  Chol <200 mg/dL - - 793 903 009  HDL > OR = 40 mg/dL - - 44 23(R) 00(T)  Calc LDL mg/dL (calc) - - 622(Q) 333(L) 108(H)  Triglycerides <150 mg/dL - - 92 72 61  Creatinine 0.40 - 1.50 mg/dL - 4.56 2.56 3.89 3.73  GFR >60.00 mL/min - 87.22 - - -   BP/Weight 01/31/2020 06/08/2019 05/31/2019 12/29/2018  12/18/2018 10/28/2018 08/12/2018  Systolic BP 117 114 112 112 - 428 -  Diastolic BP 74 80 80 80 - 70 -  Wt. (Lbs) 205 221.38 215 210 200 218.04 209  BMI 28.19 30.45 29.57 28.48 27.12 29.57 28.35  Some encounter information is confidential and restricted. Go to Review Flowsheets activity to see all data.   No flowsheet data found.    Obesity (BMI 30.0-34.9)  Patient re-educated about  the importance of commitment to a  minimum of 150 minutes of exercise per week as able.  The importance of healthy food choices with portion control discussed, as well as eating regularly and within a 12 hour window most days. The need to choose "clean , green" food 50 to 75% of the time is discussed, as well as to make water the primary drink and set a goal of 64 ounces water daily.    Weight /BMI 01/31/2020 06/08/2019 05/31/2019  WEIGHT 205 lb 221 lb 6 oz 215 lb  HEIGHT 5' 11.5" 5' 11.5" 5' 11.5"  BMI 28.19 kg/m2 30.45 kg/m2 29.57 kg/m2  Some encounter information is confidential and restricted. Go to Review Flowsheets activity to see all data.   Improved , he is applauded

## 2020-02-04 MED FILL — BELBUCA 300 MCG FILM: 300 | 30 days supply | Qty: 60 | Fill #1

## 2020-02-04 MED FILL — REXULTI 0.5 MG TABLET: 0.5 | 30 days supply | Qty: 30 | Fill #1

## 2020-02-04 MED FILL — REXULTI 2 MG TABLET: 2 | 30 days supply | Qty: 30 | Fill #1

## 2020-02-04 MED FILL — traMADol HCL 50 MG TABS: 50 | 30 days supply | Qty: 120 | Fill #0

## 2020-02-05 ENCOUNTER — Encounter: Payer: Self-pay | Admitting: Family Medicine

## 2020-02-05 NOTE — Assessment & Plan Note (Signed)
°  Patient re-educated about  the importance of commitment to a  minimum of 150 minutes of exercise per week as able.  The importance of healthy food choices with portion control discussed, as well as eating regularly and within a 12 hour window most days. The need to choose "clean , green" food 50 to 75% of the time is discussed, as well as to make water the primary drink and set a goal of 64 ounces water daily.    Weight /BMI 01/31/2020 06/08/2019 05/31/2019  WEIGHT 205 lb 221 lb 6 oz 215 lb  HEIGHT 5' 11.5" 5' 11.5" 5' 11.5"  BMI 28.19 kg/m2 30.45 kg/m2 29.57 kg/m2  Some encounter information is confidential and restricted. Go to Review Flowsheets activity to see all data.   Improved , he is applauded

## 2020-02-05 NOTE — Assessment & Plan Note (Signed)
Improved Patient educated about the importance of limiting  Carbohydrate intake , the need to commit to daily physical activity for a minimum of 30 minutes , and to commit weight loss. The fact that changes in all these areas will reduce or eliminate all together the development of diabetes is stressed.   Diabetic Labs Latest Ref Rng & Units 01/31/2020 06/08/2019 05/27/2019 12/29/2018 02/18/2018  HbA1c 4.0 - 5.6 % 6.0(A) - 6.4(H) 6.4(H) -  Chol <200 mg/dL - - 818 563 149  HDL > OR = 40 mg/dL - - 44 70(Y) 63(Z)  Calc LDL mg/dL (calc) - - 858(I) 502(D) 108(H)  Triglycerides <150 mg/dL - - 92 72 61  Creatinine 0.40 - 1.50 mg/dL - 7.41 2.87 8.67 6.72  GFR >60.00 mL/min - 87.22 - - -   BP/Weight 01/31/2020 06/08/2019 05/31/2019 12/29/2018 12/18/2018 10/28/2018 08/12/2018  Systolic BP 117 114 112 112 - 094 -  Diastolic BP 74 80 80 80 - 70 -  Wt. (Lbs) 205 221.38 215 210 200 218.04 209  BMI 28.19 30.45 29.57 28.48 27.12 29.57 28.35  Some encounter information is confidential and restricted. Go to Review Flowsheets activity to see all data.   No flowsheet data found.

## 2020-02-08 ENCOUNTER — Ambulatory Visit (INDEPENDENT_AMBULATORY_CARE_PROVIDER_SITE_OTHER): Payer: 59 | Admitting: Psychiatry

## 2020-02-08 ENCOUNTER — Other Ambulatory Visit: Payer: Self-pay

## 2020-02-08 DIAGNOSIS — F321 Major depressive disorder, single episode, moderate: Secondary | ICD-10-CM | POA: Diagnosis not present

## 2020-02-08 NOTE — Progress Notes (Signed)
Virtual Visit via Video Note  I connected with Gabriel [name] on 02/08/20 at 10:07 AM EDT  by a video enabled telemedicine application and verified that I am speaking with the correct person using two identifiers.   I discussed the limitations of evaluation and management by telemedicine and the availability of in person appointments. The patient expressed understanding and agreed to proceed.  I provided 43 minutes of non-face-to-face time during this encounter.   [name], LCSW   THERAPIST PROGRESS NOTE  Location: Patient- Home/Provider [facility] Outpatient [place] office   Session Time:  Tuesday  02/08/2020 10:07 AM -  10:50 AM   Participation Level: Active  Behavioral Response: AlertDepressed/  Type of Therapy: Individual Therapy  Treatment Goals addressed:  alleviate depressive symptoms and resume normal involvement and activity and social activity, learn and implement cognitive and behavioral strategies to overcome depression,    Interventions: CBT and Supportive  Summary: Gabriel [name] is a 44 y.o. male who is referred for services by PCP Dr. Syliva Overman due to patient experiencing symptoms of depression. He denies any psychiatric hospitalizations, He saw therapist Dorann Lodge in [place] for a few months. Patient reports symptoms began when he lost his job about a year ago. Per his report, he was fired after he complained about race discrimination. 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 He states now feeling as though he failed his family. He states not wanting to be around anyone and having difficulty engaging with his wife and children. He fears losing his family.  He reports ruminating thoughts about job and the past, excessive worry depressed mood, tearfulness, anxiety, isolative behaviors, poor motivation, poor concentration,  irritability, sleep difficulty, thoughts and feelings of hopelessness and worthlessness. He denies any suicidal ideations.  Patient last was seen via virtual visit about 2-3 weeks ago. He continues to experience  moderate symptoms of depression including depressed mood, fatigue,memory difficulty, ruminating thoughts, and poor motivation. His wife attends part of the session and reports observations of patient having good days and bad days.  She reports the bad days outweigh the good days.  Per her report, he does better when she encourages and  pushes him to do things like being involved with the family.  However, she has multiple responsibilities and reports having little time to do this.  She reports he stays at home and mainly stays on the couch on the bad days.  She also reports patient continues to ruminate about the loss of his job.    Suicidal/Homicidal: Nowithout intent/plan  Therapist Response:  reviewed symptoms, gathered information from wife regarding her observations of patient, provided psychoeducation to patient and wife regarding the role of behavioral activation in overcoming depression, assisted patient and wife began to identify activities patient could pursue to increase behavioral activation and also contribute to the household, discussed rationale for and developed plan with patient and wife to use daily planning (will send handouts-activity menu, behavioral activation activity diary) to increase behavioral activation, assisted patient link treatment outcomes to his values regarding family  Plan: Return again in 1-2  weeks  Diagnosis: Axis I: MDD, single episode, severe    Axis II: No diagnosis    [name], LCSW 02/08/2020

## 2020-02-15 ENCOUNTER — Other Ambulatory Visit: Payer: Self-pay | Admitting: Family Medicine

## 2020-02-15 DIAGNOSIS — M792 Neuralgia and neuritis, unspecified: Secondary | ICD-10-CM | POA: Diagnosis not present

## 2020-02-15 DIAGNOSIS — G5603 Carpal tunnel syndrome, bilateral upper limbs: Secondary | ICD-10-CM | POA: Diagnosis not present

## 2020-02-15 DIAGNOSIS — E559 Vitamin D deficiency, unspecified: Secondary | ICD-10-CM | POA: Diagnosis not present

## 2020-02-15 DIAGNOSIS — Z79899 Other long term (current) drug therapy: Secondary | ICD-10-CM | POA: Diagnosis not present

## 2020-02-15 DIAGNOSIS — M5416 Radiculopathy, lumbar region: Secondary | ICD-10-CM | POA: Diagnosis not present

## 2020-02-15 DIAGNOSIS — E1142 Type 2 diabetes mellitus with diabetic polyneuropathy: Secondary | ICD-10-CM | POA: Diagnosis not present

## 2020-02-15 LAB — VITAMIN D 25 HYDROXY (VIT D DEFICIENCY, FRACTURES): Vit D, 25-Hydroxy: 30 ng/mL (ref 30–100)

## 2020-02-15 MED FILL — BELBUCA 450 MCG FILM: 450 | 30 days supply | Qty: 60 | Fill #0

## 2020-02-21 NOTE — Progress Notes (Signed)
Virtual Visit via Video Note  I connected with Gabriel [name] on 02/24/20 at 11:00 AM EDT by a video enabled telemedicine application and verified that I am speaking with the correct person using two identifiers.   I discussed the limitations of evaluation and management by telemedicine and the availability of in person appointments. The patient expressed understanding and agreed to proceed.     I discussed the assessment and treatment plan with the patient. The patient was provided an opportunity to ask questions and all were answered. The patient agreed with the plan and demonstrated an understanding of the instructions.   The patient was advised to call back or seek an in-person evaluation if the symptoms worsen or if the condition fails to improve as anticipated.  Location: patient- home, provider- office   I provided 20 minutes of non-face-to-face time during this encounter.   Gabriel Hotter, MD    [facility] MD/PA/NP OP Progress Note  02/24/2020 11:22 AM Gabriel [name]  MRN:  [id number]  Chief Complaint:  Chief Complaint    Follow-up; Depression     HPI:  This is a follow-up appointment for depression.  He states that he struggles with insomnia, which he partly attributes to neuropathy.  He stays in the room most of the time as he cannot stand up due to pain.  He isolates himself.  He has little interaction with his children.  He is unsure if the higher dose of rexulti has been helpful.  He reports drowsiness, and is unsure if it is related to rexulti.  He agrees to try taking it at night.  He feels fatigue.  He has anhedonia.  He has difficulty in concentration.  He has fair appetite; he denies any weight change.  He denies SI.  He denies AH, VH, paranoia.  He feels anxious and tense at times.   Daily routine: stays in the house Employment: unemployed. 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 He was raised by his mother. He reports good relationship with her, who taught him "love and respect." He has fair relationship with his father  Visit Diagnosis:    ICD-10-CM   1. Current severe episode of major depressive disorder without psychotic features without prior episode (HCC)  F32.2   2. Insomnia, unspecified type  G47.00     Past Psychiatric History: Please see initial evaluation for full details. I have reviewed the history. No updates at this time.     Past Medical History:  Past Medical History:  Diagnosis Date  . Anxiety   . Carpal tunnel syndrome 2018  . Depression   . Elevated LFTs   . Heart murmur   . Insomnia   . Seasonal allergies   . Vitamin D deficiency     Past Surgical History:  Procedure Laterality Date  . NO PAST SURGERIES      Family Psychiatric History: Please see initial evaluation for full details. I have reviewed the history. No updates at this time.     Family History:  Family History  Problem Relation Age of Onset  . Diabetes Father   . Hypertension Father   . Hypertension Mother   . Hypertension Maternal Grandfather   . Diabetes Maternal Grandfather   . Cancer Paternal Grandmother        type unknown  . Hypertension Paternal  Grandmother   . Cancer Paternal Grandfather        type unknown  . Hypertension Paternal Grandfather     Social History:  Social History   Socioeconomic History  . Marital status: Married    Spouse name: Not on file  . Number of children: 2  . Years of education: 312  . Highest education level: High school graduate  Occupational History  . Occupation: not employed  Tobacco Use  . Smoking status: Never Smoker  . Smokeless tobacco: Never Used  Vaping Use  . Vaping Use: Never used  Substance and Sexual Activity  . Alcohol use: No    Alcohol/week: 0.0 standard drinks  . Drug use: No  . Sexual activity: Yes     Birth control/protection: None  Other Topics Concern  . Not on file  Social History Narrative   He lives with wife and two children.   Unemployed at this present time   Right handed   One story home   Highest level of education:  12th grade   Social Determinants of Health   Financial Resource Strain:   . Difficulty of Paying Living Expenses: Not on file  Food Insecurity:   . Worried About Programme researcher, broadcasting/film/videounning Out of Food in the Last Year: Not on file  . Ran Out of Food in the Last Year: Not on file  Transportation Needs:   . Lack of Transportation (Medical): Not on file  . Lack of Transportation (Non-Medical): Not on file  Physical Activity:   . Days of Exercise per Week: Not on file  . Minutes of Exercise per Session: Not on file  Stress:   . Feeling of Stress : Not on file  Social Connections:   . Frequency of Communication with Friends and Family: Not on file  . Frequency of Social Gatherings with Friends and Family: Not on file  . Attends Religious Services: Not on file  . Active Member of Clubs or Organizations: Not on file  . Attends BankerClub or Organization Meetings: Not on file  . Marital Status: Not on file    Allergies: No Known Allergies  Metabolic Disorder Labs: Lab Results  Component Value Date   HGBA1C 6.0 (A) 01/31/2020   MPG 137 05/27/2019   MPG 137 12/29/2018   No results found for: PROLACTIN Lab Results  Component Value Date   CHOL 195 05/27/2019   TRIG 92 05/27/2019   HDL 44 05/27/2019   CHOLHDL 4.4 05/27/2019   VLDL 10 12/04/2016   LDLCALC 131 (H) 05/27/2019   LDLCALC 128 (H) 12/29/2018   Lab Results  Component Value Date   TSH 1.93 06/08/2019   TSH 2.14 12/29/2018    Therapeutic Level Labs: No results found for: LITHIUM No results found for: VALPROATE No components found for:  CBMZ  Current Medications: Current Outpatient Medications  Medication Sig Dispense Refill  . Brexpiprazole (REXULTI) 3 MG TABS Take 1 tablet (3 mg total) by mouth at  bedtime. 30 tablet 1  . eszopiclone (LUNESTA) 1 MG TABS tablet Take 1 tablet (1 mg total) by mouth at bedtime as needed for sleep. Take immediately before bedtime 30 tablet 1  . gabapentin (NEURONTIN) 300 MG capsule Take 1 tablet at bedtime for one week, then increase to 1 tablet twice daily. 60 capsule 3  . loratadine (CLARITIN) 10 MG tablet Take 1 tablet (10 mg total) by mouth daily. 90 tablet 3  . mometasone (NASONEX) 50 MCG/ACT nasal spray PLACE 2 SPRAYS INTO THE NOSE DAILY.  34 g 0  . mometasone (NASONEX) 50 MCG/ACT nasal spray Place 2 sprays into the nose daily. 51 g 1  . naproxen (NAPROSYN) 500 MG tablet as needed.    . traMADol (ULTRAM) 50 MG tablet Take 50 mg by mouth 2 (two) times daily.    Marland Kitchen tretinoin (RETIN-A) 0.025 % cream Apply 1 application topically at bedtime.    Marland Kitchen venlafaxine XR (EFFEXOR-XR) 150 MG 24 hr capsule 225 mg daily (75 mg +150 mg ) 90 capsule 1  . venlafaxine XR (EFFEXOR-XR) 75 MG 24 hr capsule 225 mg daily (75 mg +150 mg ) 90 capsule 1   No current facility-administered medications for this visit.     Musculoskeletal: Strength & Muscle Tone: N/A Gait & Station: N/A Patient leans: N/A  Psychiatric Specialty Exam: Review of Systems  Psychiatric/Behavioral: Positive for decreased concentration, dysphoric mood and sleep disturbance. Negative for agitation, behavioral problems, confusion, hallucinations, self-injury and suicidal ideas. The patient is nervous/anxious. The patient is not hyperactive.   All other systems reviewed and are negative.   There were no vitals taken for this visit.There is no height or weight on file to calculate BMI.  General Appearance: Fairly Groomed  Eye Contact:  Good  Speech:  Clear and Coherent, delayed speech  Volume:  Normal  Mood:  Depressed  Affect:  Appropriate, Congruent and Restricted  Thought Process:  Coherent  Orientation:  Full (Time, Place, and Person)  Thought Content: Logical   Suicidal Thoughts:  No  Homicidal  Thoughts:  No  Memory:  Immediate;   Good  Judgement:  Good  Insight:  Present  Psychomotor Activity:  Normal  Concentration:  Concentration: Poor and Attention Span: Poor  Recall:  Good  Fund of Knowledge: Good  Language: Good  Akathisia:  No  Handed:  Right  AIMS (if indicated): not done  Assets:  Communication Skills Desire for Improvement  ADL's:  Intact  Cognition: WNL  Sleep:  Poor   Screenings: GAD-7     Office Visit from 12/29/2018 in [facility] Office Visit from 10/28/2018 in [facility] Office Visit from 01/28/2018 in [facility] Virtual [facility] Phone Follow Up from 01/08/2018 in [facility] Virtual [facility] Phone Follow Up from 12/04/2017 in [facility]  Total GAD-7 Score 21 20 15 10 15     PHQ2-9     Office Visit from 01/31/2020 in [facility] Counselor from [facility]/10[facility]2019 [facility] Office Visit from 05/31/2019 in [facility] Counselor from 05/18/2019 in [facility] Counselor from 02/15/2019 in [facility]  PHQ-2 Total Score 6 6 6 6 6   PHQ-9 Total Score 22 24 22 22 22        Assessment and Plan:  Guage Efferson is a 44 y.o. year old male with a history of depression,bilateral carpel tunnel syndrome, who presents for follow up appointment for below.   1. Current severe episode of major depressive disorder without psychotic features without prior episode [facility]) Exam is notable for delayed speech, and impaired attention, and he continues to have depressive symptoms with prominent fatigue.  Psychosocial stressors includes unemployment, which he perceives as race discrimination, and neuropathy.  Will do further up titration of rexulti selective treatment for depression.  Discussed potential metabolic side effect and EPS.  Will continue venlafaxine to target depression.  He is advised  that ECT is strongly recommended given his symptoms refractory to pharmacological treatment.  He agrees that  his wife presents to the interview at the next visit.   2. Insomnia, unspecified type He reports initial and middle insomnia.  We will start Lunesta to target insomnia.   .1. Current moderate episode of major depressive disorder without prior episode [facility]) Exam is notable for impaired attention, and he continues to report depressive symptoms since the last visit. Psychosocial stressors includes leg pain secondary to neuropathy,unemployment,and termination of his job,which she perceives as racial discrimination.  Will uptitrate rexulti as adjunctive treatment for depression.  Discussed potential metabolic side effect and EPS.  Coached behavioral activation.   Plan 1.Continuevenlafaxine 225mg  (150 mg + 75 mg)daily 2.IncreaseRexulti 3mg  at night - monitor drowsiness  3. Start Lunesta 1 mg at night as needed for sleep 3. Next appointment-11/18 at 3:30 for 30 mins, video. He agrees that his wife presents to the interview at the next visit.  - on tramadol  Past trials of medication:sertraline, fluoxetine,mirtazapine, nortriptyline for carpel tunnel syndrome,bupropion (drowsiness),quetiapine (drowsiness),Abilify ("off balance"),Trazodone, Ambien (limited benefit),lorazepam (worsening in anxiety, drowsiness)  The patient demonstrates the following risk factors for suicide: Chronic risk factors for suicide include:psychiatric disorder ofdepression. Acute risk factorsfor suicide include: unemployment and loss (financial, interpersonal, professional). Protective factorsfor this patient include: positive social support, responsibility to others (children, family) and hope for the future. Considering these factors, the overall suicide risk at this point appears to below. Patientisappropriate for outpatient follow up.   , MD 02/24/2020, 11:22 AM

## 2020-02-22 ENCOUNTER — Ambulatory Visit (INDEPENDENT_AMBULATORY_CARE_PROVIDER_SITE_OTHER): Payer: 59 | Admitting: Psychiatry

## 2020-02-22 ENCOUNTER — Other Ambulatory Visit: Payer: Self-pay

## 2020-02-22 DIAGNOSIS — F321 Major depressive disorder, single episode, moderate: Secondary | ICD-10-CM

## 2020-02-22 NOTE — Progress Notes (Signed)
Virtual Visit via Video Note  I connected with Gabriel [name] on 02/22/20 at 10:20 AM EDT  by a video enabled telemedicine application and verified that I am speaking with the correct person using two identifiers.   I discussed the limitations of evaluation and management by telemedicine and the availability of in person appointments. The patient expressed understanding and agreed to proceed.  I provided 45 minutes of non-face-to-face time during this encounter.   [name], LCSW   THERAPIST PROGRESS NOTE  Location: Patient- Home/Provider [facility] [place] office   Session Time:  Tuesday  02/22/2020 10:20 AM - 11:05 AM   Participation Level: Active  Behavioral Response: AlertDepressed/  Type of Therapy: Individual Therapy  Treatment Goals addressed:  alleviate depressive symptoms and resume normal involvement and activity and social activity, learn and implement cognitive and behavioral strategies to overcome depression,    Interventions: CBT and Supportive  Summary: Gabriel [name] is a 44 y.o. male who is referred for services by PCP Dr. Syliva Overman due to patient experiencing symptoms of depression. He denies any psychiatric hospitalizations, He saw therapist Dorann Lodge in [place] for a few months. Patient reports symptoms began when he lost his job about a year ago. Per his report, he was fired after he complained about race discrimination. 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 He states now feeling as though he failed his family. He states not wanting to be around anyone and having difficulty engaging with his wife and children. He fears losing his family.  He reports ruminating thoughts about job and the past, excessive worry depressed mood, tearfulness, anxiety, isolative behaviors, poor motivation, poor concentration,  irritability, sleep difficulty, thoughts and feelings of hopelessness and worthlessness. He denies any suicidal ideations.  Patient last was seen via virtual visit about 2-3 weeks ago. He continues to experience  moderate symptoms of depression including depressed mood, fatigue,memory difficulty, ruminating thoughts, and poor motivation. He reports he did not implement plan from last session as he continues to experience pain in his legs. He also reports ruminating thoughts about the way he was treated on his job.    Suicidal/Homicidal: Nowithout intent/plan  Therapist Response:  reviewed symptoms, assisted patient identify thoughts and processes that inhibited implementation of plan, reviewed connection between thoughts/mood/and behavior, also discussed connection between depression and the perception of pain, assisted patient identify pleasurable activities to pursue within his capability, reviewed rationale for and assisted patient practice using activity menu/ completing daily planning calendar, developed plan with patient to do activities within the expression's category in the mornings and also watch a comedy to promote laughter/listen to jazz music in the afternoons/go for a drive in the evenings, assisted patient identify/address thoughts and processes that may inhibit implementation of plan, obtained patient's commitment to implement plan,   Plan: Return again in 1-2  weeks  Diagnosis: Axis I: MDD, single episode, severe    Axis II: No diagnosis    [name], LCSW 02/22/2020

## 2020-02-24 ENCOUNTER — Encounter (HOSPITAL_COMMUNITY): Payer: Self-pay | Admitting: Psychiatry

## 2020-02-24 ENCOUNTER — Other Ambulatory Visit: Payer: Self-pay

## 2020-02-24 ENCOUNTER — Telehealth (INDEPENDENT_AMBULATORY_CARE_PROVIDER_SITE_OTHER): Payer: 59 | Admitting: Psychiatry

## 2020-02-24 ENCOUNTER — Other Ambulatory Visit (HOSPITAL_COMMUNITY): Payer: Self-pay | Admitting: Psychiatry

## 2020-02-24 DIAGNOSIS — G4700 Insomnia, unspecified: Secondary | ICD-10-CM

## 2020-02-24 DIAGNOSIS — F322 Major depressive disorder, single episode, severe without psychotic features: Secondary | ICD-10-CM | POA: Diagnosis not present

## 2020-02-24 MED ORDER — ESZOPICLONE 1 MG PO TABS: 1.0000 mg | ORAL_TABLET | Freq: Every evening | ORAL | 1 refills | Status: DC | PRN

## 2020-02-24 MED ORDER — REXULTI 3 MG PO TABS: 3.0000 mg | ORAL_TABLET | Freq: Every day | ORAL | 1 refills | Status: DC

## 2020-02-24 MED FILL — REXULTI 3 MG TABLET: 3 | 30 days supply | Qty: 30 | Fill #0

## 2020-02-24 MED FILL — ESZOPICLONE 1 MG TABLET: 1 | 30 days supply | Qty: 30 | Fill #0

## 2020-03-09 MED FILL — VENLAFAXINE HCL ER 75 MG CA: 75 | 90 days supply | Qty: 90 | Fill #1

## 2020-03-09 MED FILL — VENLAFAXINE HCL ER 150 MG C: 150 | 90 days supply | Qty: 90 | Fill #1

## 2020-03-09 MED FILL — traMADol HCL 50 MG TABS: 50 | 30 days supply | Qty: 120 | Fill #1

## 2020-03-13 ENCOUNTER — Other Ambulatory Visit: Payer: Self-pay

## 2020-03-13 ENCOUNTER — Ambulatory Visit (INDEPENDENT_AMBULATORY_CARE_PROVIDER_SITE_OTHER): Payer: 59 | Admitting: Psychiatry

## 2020-03-13 DIAGNOSIS — F322 Major depressive disorder, single episode, severe without psychotic features: Secondary | ICD-10-CM

## 2020-03-13 NOTE — Progress Notes (Signed)
Virtual Visit via Video Note  I connected with [name] on 03/13/20 at 1:07 PM EDT  by a video enabled telemedicine application and verified that I am speaking with the correct person using two identifiers.  Location: Patient: Home Provider: [facility] [place] office    I discussed the limitations of evaluation and management by telemedicine and the availability of in person appointments. The patient expressed understanding and agreed to proceed.  I provided 48 minutes of non-face-to-face time during this encounter.   [name], LCSW    THERAPIST PROGRESS NOTE  Location: Patient- Home/Provider [facility] Outpatient [place] office   Session Time:  Monday 03/13/2020  1:07 PM -1:55 PM  Participation Level: Active  Behavioral Response: AlertDepressed/  Type of Therapy: Individual Therapy  Treatment Goals addressed:  alleviate depressive symptoms and resume normal involvement and activity and social activity, learn and implement cognitive and behavioral strategies to overcome depression,    Interventions: CBT and Supportive  Summary: Gabriel [name] is a 44 y.o. male who is referred for services by PCP Dr. Syliva Overman due to patient experiencing symptoms of depression. He denies any psychiatric hospitalizations, He saw therapist Dorann Lodge in [place] for a few months. Patient reports symptoms began when he lost his job about a year ago. Per his report, he was fired after he complained about race discrimination. 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 He states now feeling as though he failed his family. He states not wanting to be around anyone and having difficulty engaging with his wife and children. He fears losing his family.  He reports ruminating thoughts about job and the past, excessive worry depressed mood, tearfulness,  anxiety, isolative behaviors, poor motivation, poor concentration, irritability, sleep difficulty, thoughts and feelings of hopelessness and worthlessness. He denies any suicidal ideations.  Patient last was seen via virtual visit about 3 weeks ago. He continues to experience  moderate symptoms of depression including depressed mood, fatigue,memory difficulty, ruminating thoughts, and poor motivation.  He now is taking a sleep aid  as prescribed by psychiatrist Dr. Vanetta Shawl but reports continued sleep difficulty.  He says he can fall asleep but can't stay asleep. He did not implement plan from last session as he forgot.  He says he does remember having dinner with his family a couple of times since last session.  He also reports he went outside twice at his wife's urging.  He continues to experience pain in his legs and only getting relief when he elevates his leg. He continues to ruminate about the loss of his job and blames his current physical problems with his legs as well as having carpal tunnel on the work he did for his job.    Suicidal/Homicidal: Nowithout intent/plan  Therapist Response:  reviewed symptoms, assisted patient identify thoughts and processes that inhibited implementation of plan, reviewed role of behavioral activation in coping with depression, discussed possible ways to increase interaction with his family especially his sons, developed plan with patient to have daily interaction with sons for a few minutes after they return home from school, assisted patient identify/address thoughts and processes that may inhibit implementation of plan, obtained patient's commitment to implement plan,   Plan: Return again in 1-2  weeks  Diagnosis: Axis I: MDD, single episode, severe    Axis II: No diagnosis  [name], LCSW 03/13/2020

## 2020-03-14 ENCOUNTER — Other Ambulatory Visit (HOSPITAL_COMMUNITY): Payer: Self-pay | Admitting: Neurology

## 2020-03-14 DIAGNOSIS — M792 Neuralgia and neuritis, unspecified: Secondary | ICD-10-CM | POA: Diagnosis not present

## 2020-03-14 DIAGNOSIS — Z79899 Other long term (current) drug therapy: Secondary | ICD-10-CM | POA: Diagnosis not present

## 2020-03-14 DIAGNOSIS — E1142 Type 2 diabetes mellitus with diabetic polyneuropathy: Secondary | ICD-10-CM | POA: Diagnosis not present

## 2020-03-14 DIAGNOSIS — Z79891 Long term (current) use of opiate analgesic: Secondary | ICD-10-CM | POA: Diagnosis not present

## 2020-03-14 DIAGNOSIS — G5603 Carpal tunnel syndrome, bilateral upper limbs: Secondary | ICD-10-CM | POA: Diagnosis not present

## 2020-03-14 DIAGNOSIS — M5416 Radiculopathy, lumbar region: Secondary | ICD-10-CM | POA: Diagnosis not present

## 2020-03-14 MED FILL — BELBUCA 600 MCG FILM: 600 | 30 days supply | Qty: 60 | Fill #0

## 2020-03-15 ENCOUNTER — Other Ambulatory Visit (HOSPITAL_COMMUNITY): Payer: Self-pay | Admitting: Dermatology

## 2020-03-15 DIAGNOSIS — L03039 Cellulitis of unspecified toe: Secondary | ICD-10-CM | POA: Diagnosis not present

## 2020-03-15 DIAGNOSIS — B360 Pityriasis versicolor: Secondary | ICD-10-CM | POA: Diagnosis not present

## 2020-03-15 DIAGNOSIS — L219 Seborrheic dermatitis, unspecified: Secondary | ICD-10-CM | POA: Diagnosis not present

## 2020-03-15 DIAGNOSIS — Z79899 Other long term (current) drug therapy: Secondary | ICD-10-CM | POA: Diagnosis not present

## 2020-03-15 MED FILL — FLUCONAZOLE 150 MG TABS: 150 | 5 days supply | Qty: 5 | Fill #0

## 2020-03-15 MED FILL — KETOCONAZOLE 2 % SHAM: 2 | 30 days supply | Qty: 120 | Fill #0

## 2020-03-15 MED FILL — GENTAMICIN SULFATE 0.1 % OI: 0.1 | 21 days supply | Qty: 15 | Fill #0

## 2020-03-15 MED FILL — PIMECROLIMUS 1 % CREA: 1 | 30 days supply | Qty: 30 | Fill #0

## 2020-03-21 NOTE — Progress Notes (Signed)
Virtual Visit via Telephone Note  I connected with Gabriel [name] on 03/30/20 at  3:40 PM EST by telephone and verified that I am speaking with the correct person using two identifiers.  Location: Patient: home  Provider: office   I discussed the limitations, risks, security and privacy concerns of performing an evaluation and management service by telephone and the availability of in person appointments. I also discussed with the patient that there may be a patient responsible charge related to this service. The patient expressed understanding and agreed to proceed.     I discussed the assessment and treatment plan with the patient. The patient was provided an opportunity to ask questions and all were answered. The patient agreed with the plan and demonstrated an understanding of the instructions.   The patient was advised to call back or seek an in-person evaluation if the symptoms worsen or if the condition fails to improve as anticipated.  I provided 15 minutes of non-face-to-face time during this encounter.   Gabriel Hotter, MD     [facility] MD/PA/NP OP Progress Note  03/30/2020 4:07 PM Gabriel [name]  MRN:  [id number]  Chief Complaint:  Chief Complaint    Depression; Follow-up     HPI:  This is a follow-up appointment for depression and insomnia.  He states that his favorite maternal aunt passed away a few weeks ago.  She was suffering from COVID-like symptoms.  It has been difficult for him to lose her.  Although he was doing a little better, talking more with his family, it was "crushing moment" for him.  Although he was able to attend funerals, it did not well due to his leg pain, significant anxiety with palpitation as he has not been around with many people for a while.  He sleeps 4 to 5 hours.  He was sleeping better before all this loss of his aunt.  He has fair appetite.  He has not measured his weight as he does not have a scale at home. He feels fatigue.  He has  difficulty in concentration.  He denies SI.  He denies any side effect since up titration of rexulti.   Daily routine: stays in the house Employment: unemployed. 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 He was raised by his mother. He reports good relationship with her, who taught him "love and respect." He has fair relationship with his father  Visit Diagnosis:    ICD-10-CM   1. Current severe episode of major depressive disorder without psychotic features without prior episode (HCC)  F32.2   2. Insomnia, unspecified type  G47.00    . Past Psychiatric History: Please see initial evaluation for full details. I have reviewed the history. No updates at this time.     Past Medical History:  Past Medical History:  Diagnosis Date  . Anxiety   . Carpal tunnel syndrome 2018  . Depression   . Elevated LFTs   . Heart murmur   . Insomnia   . Seasonal allergies   . Vitamin D deficiency     Past Surgical History:  Procedure Laterality Date  . NO PAST SURGERIES      Family Psychiatric History: Please see initial evaluation for full details. I have reviewed the history. No updates at this time.     Family History:  Family  History  Problem Relation Age of Onset  . Diabetes Father   . Hypertension Father   . Hypertension Mother   . Hypertension Maternal Grandfather   . Diabetes Maternal Grandfather   . Cancer Paternal Grandmother        type unknown  . Hypertension Paternal Grandmother   . Cancer Paternal Grandfather        type unknown  . Hypertension Paternal Grandfather     Social History:  Social History   Socioeconomic History  . Marital status: Married    Spouse name: Not on file  . Number of children: 2  . Years of education: 27  . Highest education level: High school graduate  Occupational  History  . Occupation: not employed  Tobacco Use  . Smoking status: Never Smoker  . Smokeless tobacco: Never Used  Vaping Use  . Vaping Use: Never used  Substance and Sexual Activity  . Alcohol use: No    Alcohol/week: 0.0 standard drinks  . Drug use: No  . Sexual activity: Yes    Birth control/protection: None  Other Topics Concern  . Not on file  Social History Narrative   He lives with wife and two children.   Unemployed at this present time   Right handed   One story home   Highest level of education:  12th grade   Social Determinants of Health   Financial Resource Strain:   . Difficulty of Paying Living Expenses: Not on file  Food Insecurity:   . Worried About Programme researcher, broadcasting/film/video in the Last Year: Not on file  . Ran Out of Food in the Last Year: Not on file  Transportation Needs:   . Lack of Transportation (Medical): Not on file  . Lack of Transportation (Non-Medical): Not on file  Physical Activity:   . Days of Exercise per Week: Not on file  . Minutes of Exercise per Session: Not on file  Stress:   . Feeling of Stress : Not on file  Social Connections:   . Frequency of Communication with Friends and Family: Not on file  . Frequency of Social Gatherings with Friends and Family: Not on file  . Attends Religious Services: Not on file  . Active Member of Clubs or Organizations: Not on file  . Attends Banker Meetings: Not on file  . Marital Status: Not on file    Allergies: No Known Allergies  Metabolic Disorder Labs: Lab Results  Component Value Date   HGBA1C 6.0 (A) 01/31/2020   MPG 137 05/27/2019   MPG 137 12/29/2018   No results found for: PROLACTIN Lab Results  Component Value Date   CHOL 195 05/27/2019   TRIG 92 05/27/2019   HDL 44 05/27/2019   CHOLHDL 4.4 05/27/2019   VLDL 10 12/04/2016   LDLCALC 131 (H) 05/27/2019   LDLCALC 128 (H) 12/29/2018   Lab Results  Component Value Date   TSH 1.93 06/08/2019   TSH 2.14 12/29/2018     Therapeutic Level Labs: No results found for: LITHIUM No results found for: VALPROATE No components found for:  CBMZ  Current Medications: Current Outpatient Medications  Medication Sig Dispense Refill  . [START ON 04/25/2020] Brexpiprazole (REXULTI) 3 MG TABS Take 1 tablet (3 mg total) by mouth at bedtime. 30 tablet 0  . eszopiclone (LUNESTA) 2 MG TABS tablet Take 1 tablet (2 mg total) by mouth at bedtime as needed for sleep. Take immediately before bedtime 30 tablet 0  . gabapentin (NEURONTIN)  300 MG capsule Take 1 tablet at bedtime for one week, then increase to 1 tablet twice daily. 60 capsule 3  . loratadine (CLARITIN) 10 MG tablet Take 1 tablet (10 mg total) by mouth daily. 90 tablet 3  . mometasone (NASONEX) 50 MCG/ACT nasal spray PLACE 2 SPRAYS INTO THE NOSE DAILY. 34 g 0  . mometasone (NASONEX) 50 MCG/ACT nasal spray Place 2 sprays into the nose daily. 51 g 1  . naproxen (NAPROSYN) 500 MG tablet as needed.    . traMADol (ULTRAM) 50 MG tablet Take 50 mg by mouth 2 (two) times daily.    Marland Kitchen tretinoin (RETIN-A) 0.025 % cream Apply 1 application topically at bedtime.    Marland Kitchen venlafaxine XR (EFFEXOR-XR) 150 MG 24 hr capsule 225 mg daily (75 mg +150 mg ) 90 capsule 1  . venlafaxine XR (EFFEXOR-XR) 75 MG 24 hr capsule 225 mg daily (75 mg +150 mg ) 90 capsule 1   No current facility-administered medications for this visit.     Musculoskeletal: Strength & Muscle Tone: N/A Gait & Station: N/A Patient leans: N/A  Psychiatric Specialty Exam: Review of Systems  Psychiatric/Behavioral: Positive for decreased concentration, dysphoric mood and sleep disturbance. Negative for agitation, behavioral problems, confusion, hallucinations, self-injury and suicidal ideas. The patient is nervous/anxious. The patient is not hyperactive.   All other systems reviewed and are negative.   There were no vitals taken for this visit.There is no height or weight on file to calculate BMI.  General  Appearance: NA  Eye Contact:  NA  Speech:  Clear and Coherent  Volume:  Normal  Mood:  Depressed  Affect:  NA  Thought Process:  Coherent  Orientation:  Full (Time, Place, and Person)  Thought Content: Logical   Suicidal Thoughts:  No  Homicidal Thoughts:  No  Memory:  Immediate;   Good  Judgement:  Good  Insight:  Fair  Psychomotor Activity:  Normal  Concentration:  Concentration: Good and Attention Span: Good  Recall:  Good  Fund of Knowledge: Good  Language: Good  Akathisia:  No  Handed:  Right  AIMS (if indicated): not done  Assets:  Communication Skills Desire for Improvement  ADL's:  Intact  Cognition: WNL  Sleep:  Poor   Screenings: GAD-7     Office Visit from 12/29/2018 in [facility] Office Visit from 10/28/2018 in [facility] Office Visit from 01/28/2018 in [facility] Phone Follow Up from 01/08/2018 in [facility] Phone Follow Up from 12/04/2017 in [facility]  Total GAD-7 Score 21 20 15 10 15     PHQ2-9     Office Visit from 01/31/2020 in [facility] Counselor from [facility]/10/2019 [facility] Office Visit from 05/31/2019 in [facility] Counselor from 05/18/2019 in [facility] Counselor from 02/15/2019 in [facility]  PHQ-2 Total Score 6 6 6 6 6   PHQ-9 Total Score 22 24 22 22 22        Assessment and Plan:  Gabriel [name] is a 44 y.o. year old male with a history of depression,bilateral carpel tunnel syndrome, who presents for follow up appointment for below.   1. Current severe episode of major depressive disorder without psychotic features without prior episode (HCC) Although she reports improvement in depressive symptoms after up titration of rexulti, he started to have worsening in depressive symptoms in the context of loss of his  maternal aunt.  Other psychosocial stressors includes unemployment, and neuropathy.  Will continue current medication regimen with the hopes that his current mood symptoms are more situational, and his mood improves as his sleep improves.  Will continue venlafaxine to target depression.  We will continue rexulti as adjunctive treatment for depression.  Discussed potential metabolic side effect and EPS.  Noted that although it was discussed regarding referral to ECT, he had declined this option.  He is reminded again that his wife to be present at the next visit.   2. Insomnia, unspecified type He reports good response to Lunesta.  We uptitrate the dose to optimize treatment for insomnia.   Plan I have reviewed and updated plans as below 1.Continuevenlafaxine 225mg  (150 mg + 75 mg)daily 2.ContinueRexulti 3mg  at night - monitor drowsiness  3. Increase Lunesta 2 mg at night as needed for sleep 4. Next appointment-12/16 at 3:30 for 30 mins, video. He agrees that his wife presents to the interview at the next visit.  - on tramadol  Past trials of medication:sertraline, fluoxetine,mirtazapine, nortriptyline for carpel tunnel syndrome,bupropion (drowsiness),quetiapine (drowsiness),Abilify ("off balance"),Trazodone, Ambien (limited benefit),lorazepam (worsening in anxiety, drowsiness)  The patient demonstrates the following risk factors for suicide: Chronic risk factors for suicide include:psychiatric disorder ofdepression. Acute risk factorsfor suicide include: unemployment and loss (financial, interpersonal, professional). Protective factorsfor this patient include: positive social support, responsibility to others (children, family) and hope for the future. Considering these factors, the overall suicide risk at this point appears to below. Patientisappropriate for outpatient follow up.   Gabriel Hottereina Jaran Sainz, MD 03/30/2020, 4:07 PM

## 2020-03-24 ENCOUNTER — Ambulatory Visit (INDEPENDENT_AMBULATORY_CARE_PROVIDER_SITE_OTHER): Payer: 59 | Admitting: Psychiatry

## 2020-03-24 ENCOUNTER — Other Ambulatory Visit: Payer: Self-pay

## 2020-03-24 DIAGNOSIS — F322 Major depressive disorder, single episode, severe without psychotic features: Secondary | ICD-10-CM

## 2020-03-24 NOTE — Progress Notes (Signed)
Virtual Visit via Telephone Note  I connected with Gabriel [name] on 03/24/20 at 11:16 AM EST by telephone and verified that I am speaking with the correct person using two identifiers.  Location: Patient: Home Provider: [facility] Outpatient [place] office   I discussed the limitations, risks, security and privacy concerns of performing an evaluation and management service by telephone and the availability of in person appointments. I also discussed with the patient that there may be a patient responsible charge related to this service. The patient expressed understanding and agreed to proceed.  I provided 44  minutes of non-face-to-face time during this encounter.   [name], LCSW    THERAPIST PROGRESS NOTE  Location: Patient- Home/Provider [facility] [place] office   Session Time:  Friday 03/24/2020 11:16 AM -  12:00 PM    Participation Level: Active  Behavioral Response: AlertDepressed/  Type of Therapy: Individual Therapy  Treatment Goals addressed:  alleviate depressive symptoms and resume normal involvement and activity and social activity, learn and implement cognitive and behavioral strategies to overcome depression,    Interventions: CBT and Supportive  Summary: Gabriel [name] is a 44 y.o. male who is referred for services by PCP Dr. Syliva Overman due to patient experiencing symptoms of depression. He denies any psychiatric hospitalizations, He saw therapist Dorann Lodge in [place] for a few months. Patient reports symptoms began when he lost his job about a year ago. Per his report, he was fired after he complained about race discrimination. 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 He states now feeling as though he failed his family. He states not wanting to be around anyone and having difficulty engaging with his wife and  children. He fears losing his family.  He reports ruminating thoughts about job and the past, excessive worry depressed mood, tearfulness, anxiety, isolative behaviors, poor motivation, poor concentration, irritability, sleep difficulty, thoughts and feelings of hopelessness and worthlessness. He denies any suicidal ideations.  Patient last was seen via virtual visit about 3 weeks ago. He continues to experience  moderate symptoms of depression including depressed mood, fatigue,memory difficulty, ruminating thoughts, and poor motivation.  He reports increased sadness as his favorite aunt recently died.  He reports being very anxious and worried about attending the funeral but says he did attend the funeral.  He reports he did not implement plan regarding interaction with his sons due to the recent death of his aunt.  He continues to have negative thoughts about self ( I am a failure) and fears children have negative thoughts about him as well.   Suicidal/Homicidal: Nowithout intent/plan  Therapist Response:  reviewed symptoms, facilitated patient expressing thoughts and feelings regarding recent grief and loss issues, validated and normalized feelings related to grief and loss, assisted patient identify his pattern of avoidance of discomfort regarding interaction with his children, assisted patient identify ways to have self compassion regarding the discomfort/pain/fear, used ACT (DARE -defusion, acceptance of discomfort, realistic goals, and embracing values ) to cope with painful feelings and thoughts, developed plan to have daily interaction with sons  Plan: Return again in 1-2  weeks  Diagnosis: Axis I: MDD, single episode, severe    Axis II: No diagnosis    [name], LCSW 03/24/2020

## 2020-03-30 ENCOUNTER — Encounter: Payer: Self-pay | Admitting: Psychiatry

## 2020-03-30 ENCOUNTER — Telehealth (INDEPENDENT_AMBULATORY_CARE_PROVIDER_SITE_OTHER): Payer: 59 | Admitting: Psychiatry

## 2020-03-30 ENCOUNTER — Other Ambulatory Visit: Payer: Self-pay

## 2020-03-30 ENCOUNTER — Telehealth (HOSPITAL_COMMUNITY): Payer: 59 | Admitting: Psychiatry

## 2020-03-30 ENCOUNTER — Other Ambulatory Visit: Payer: Self-pay | Admitting: Psychiatry

## 2020-03-30 DIAGNOSIS — G4700 Insomnia, unspecified: Secondary | ICD-10-CM | POA: Diagnosis not present

## 2020-03-30 DIAGNOSIS — F322 Major depressive disorder, single episode, severe without psychotic features: Secondary | ICD-10-CM | POA: Diagnosis not present

## 2020-03-30 MED ORDER — ESZOPICLONE 2 MG PO TABS
2.0000 mg | ORAL_TABLET | Freq: Every evening | ORAL | 0 refills | Status: DC | PRN
Start: 2020-03-30 — End: 2020-05-18

## 2020-03-30 MED ORDER — REXULTI 3 MG PO TABS
3.0000 mg | ORAL_TABLET | Freq: Every day | ORAL | 0 refills | Status: DC
Start: 2020-04-25 — End: 2020-06-19

## 2020-03-30 MED FILL — REXULTI 3 MG TABLET: 3 | 30 days supply | Qty: 30 | Fill #0

## 2020-03-30 MED FILL — ESZOPICLONE 2 MG TAB: 2 | 30 days supply | Qty: 30 | Fill #0

## 2020-03-30 NOTE — Patient Instructions (Signed)
1.Continuevenlafaxine 225mg  (150 mg + 75 mg)daily 2.ContinueRexulti 3mg  at night - monitor drowsiness  3. Increase Lunesta 2 mg at night as needed for sleep 3. Next appointment-12/16 at 3:30. Please ask your wife to be present at the interview if she is available

## 2020-03-31 MED FILL — HYDROCORTISONE 2.5% CREAM: 2.5 | 14 days supply | Qty: 30 | Fill #1

## 2020-04-12 ENCOUNTER — Other Ambulatory Visit (HOSPITAL_COMMUNITY): Payer: Self-pay | Admitting: Neurology

## 2020-04-12 DIAGNOSIS — M792 Neuralgia and neuritis, unspecified: Secondary | ICD-10-CM | POA: Diagnosis not present

## 2020-04-12 DIAGNOSIS — G5603 Carpal tunnel syndrome, bilateral upper limbs: Secondary | ICD-10-CM | POA: Diagnosis not present

## 2020-04-12 DIAGNOSIS — M5416 Radiculopathy, lumbar region: Secondary | ICD-10-CM | POA: Diagnosis not present

## 2020-04-12 DIAGNOSIS — Z79891 Long term (current) use of opiate analgesic: Secondary | ICD-10-CM | POA: Diagnosis not present

## 2020-04-12 DIAGNOSIS — E1142 Type 2 diabetes mellitus with diabetic polyneuropathy: Secondary | ICD-10-CM | POA: Diagnosis not present

## 2020-04-12 MED FILL — BELBUCA 750 MCG FILM: 750 | 30 days supply | Qty: 60 | Fill #0

## 2020-04-12 MED FILL — traMADol HCL 50 MG TABS: 50 | 23 days supply | Qty: 180 | Fill #0

## 2020-04-19 ENCOUNTER — Other Ambulatory Visit: Payer: Self-pay

## 2020-04-19 ENCOUNTER — Ambulatory Visit (INDEPENDENT_AMBULATORY_CARE_PROVIDER_SITE_OTHER): Payer: 59 | Admitting: Psychiatry

## 2020-04-19 DIAGNOSIS — F322 Major depressive disorder, single episode, severe without psychotic features: Secondary | ICD-10-CM | POA: Diagnosis not present

## 2020-04-19 NOTE — Progress Notes (Addendum)
Virtual Visit via Telephone Note  I connected with Gabriel [name] on 04/19/20 at 9:12 AM EST  by telephone and verified that I am speaking with the correct person using two identifiers.  Location: Patient: Home  Provider: [facility] Outpatient [place] office    I discussed the limitations, risks, security and privacy concerns of performing an evaluation and management service by telephone and the availability of in person appointments. I also discussed with the patient that there may be a patient responsible charge related to this service. The patient expressed understanding and agreed to proceed.    I provided 42 minutes of non-face-to-face time during this encounter.   [name], LCSW   THERAPIST PROGRESS NOTE  Location: Patient- Home/Provider [facility] Out[facility] [place] office   Session Time:  Wednesday 04/19/2020 9:12 AM -  9:54 AM    Participation Level: Active  Behavioral Response: AlertDepressed/  Type of Therapy: Individual Therapy  Treatment Goals addressed:  alleviate depressive symptoms and resume normal involvement and activity and social activity, learn and implement cognitive and behavioral strategies to overcome depression,    Interventions: CBT and Supportive  Summary: Gabriel [name] is a 44 y.o. male who is referred for services by PCP Dr. Syliva Overman due to patient experiencing symptoms of depression. He denies any psychiatric hospitalizations, He saw therapist Dorann Lodge in [place] for a few months. Patient reports symptoms began when he lost his job about a year ago. Per his report, he was fired after he complained about race discrimination. 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 He states now feeling as though he failed his family. He states not wanting to be around anyone and having difficulty engaging with his wife and  children. He fears losing his family.  He reports ruminating thoughts about job and the past, excessive worry depressed mood, tearfulness, anxiety, isolative behaviors, poor motivation, poor concentration, irritability, sleep difficulty, thoughts and feelings of hopelessness and worthlessness. He denies any suicidal ideations.  Patient last was seen via virtual visit about 3 weeks ago. He continues to experience  moderate symptoms of depression including depressed mood, fatigue,memory difficulty, ruminating thoughts, and poor motivation.  He continues to have grief and loss issues regarding his deceased aunt.  He reports attempting to have interaction with his sons a couple of days since last session.  He reports it was not as painful emotionally as he thought it would be.  He still reports little involvement in any other activity still continues to have negative thoughts about self.  SI/HI .Nowithout intent/plan  Therapist Response:  reviewed symptoms,  validated and normalized feelings related to grief and loss, assisted patient examine his interaction with his children, discussed the effects, discussed possible benefits, assisted patient identify ways to increase interaction with his children, developed plan with patient to increase interaction with children and assisted patient identify possible subjects and questions to generate more conversation with children, reiterated ACT principles ( DARE -defusion, acceptance of discomfort, realistic goals, and embracing values ) to cope with painful feelings and thoughts, reviewed treatment plan, obtained patient's permission to initial plan for patient as this was a virtual visit  Plan: Return again in 1-2  weeks  Diagnosis: Axis I: MDD, single episode, severe    Axis II: No diagnosis    [name], LCSW 04/19/2020

## 2020-04-24 NOTE — Progress Notes (Signed)
Virtual Visit via Video Note  I connected with Gabriel [name] on 04/27/20 at  3:30 PM EST by a video enabled telemedicine application and verified that I am speaking with the correct person using two identifiers.  Location: Patient: home Provider: office Persons participated in the visit- patient, provider, Durward Mallard, his wife to elaborate the story   I discussed the limitations of evaluation and management by telemedicine and the availability of in person appointments. The patient expressed understanding and agreed to proceed.     I discussed the assessment and treatment plan with the patient. The patient was provided an opportunity to ask questions and all were answered. The patient agreed with the plan and demonstrated an understanding of the instructions.   The patient was advised to call back or seek an in-person evaluation if the symptoms worsen or if the condition fails to improve as anticipated.  I provided 20 minutes of non-face-to-face time during this encounter.   [name], MD    [facility] MD/PA/NP OP Progress Note  04/27/2020 4:20 PM Gabriel [name]  MRN:  [id number]  Chief Complaint:  Chief Complaint    Follow-up; Depression     HPI:  This is a follow-up appointment for depression.  He states that he is having good days and bad days.  He is able to get out of the bed and takes a shower when he has good days.  He stays in the bed due to leg pain when he has bad days.  He has good days 2 out of 7 days.  He believes he has been doing slightly better.  He had a good Thanksgiving (he asked whether it was Christmas or Thanksgiving) with his family.  He sleeps better since up titration of Lunesta.  He has difficulty in concentration.  He eats 2 meals or less.  He denies change in weight.  He feels fatigue.  He denies SI.  He denies alcohol use or drug use.   Durward Mallard presents to the interview with patient consent.  She believes he has been doing slightly better.  He sleeps  better.  She sees himself pushing through things compared to before.  However, she thinks he has been more drowsy nightly.  She attributes it to up titration of Belbuca.  She agrees to contact his neurologist for guidance about his medication. Durward Mallard does not feel comfortable regarding him getting referral for ECT. She would like to hold it until he sees his neurologist.    Visit Diagnosis:    ICD-10-CM   1. Current severe episode of major depressive disorder without psychotic features without prior episode (HCC)  F32.2   2. Insomnia, unspecified type  G47.00     Past Psychiatric History: Please see initial evaluation for full details. I have reviewed the history. No updates at this time.     Past Medical History:  Past Medical History:  Diagnosis Date  . Anxiety   . Carpal tunnel syndrome 2018  . Depression   . Elevated LFTs   . Heart murmur   . Insomnia   . Seasonal allergies   . Vitamin D deficiency     Past Surgical History:  Procedure Laterality Date  . NO PAST SURGERIES      Family Psychiatric History: Please see initial evaluation for full details. I have reviewed the history. No updates at this time.     Family History:  Family History  Problem Relation Age of Onset  . Diabetes Father   . Hypertension Father   .  Hypertension Mother   . Hypertension Maternal Grandfather   . Diabetes Maternal Grandfather   . Cancer Paternal Grandmother        type unknown  . Hypertension Paternal Grandmother   . Cancer Paternal Grandfather        type unknown  . Hypertension Paternal Grandfather     Social History:  Social History   Socioeconomic History  . Marital status: Married    Spouse name: Not on file  . Number of children: 2  . Years of education: 5  . Highest education level: High school graduate  Occupational History  . Occupation: not employed  Tobacco Use  . Smoking status: Never Smoker  . Smokeless tobacco: Never Used  Vaping Use  . Vaping Use:  Never used  Substance and Sexual Activity  . Alcohol use: No    Alcohol/week: 0.0 standard drinks  . Drug use: No  . Sexual activity: Yes    Birth control/protection: None  Other Topics Concern  . Not on file  Social History Narrative   He lives with wife and two children.   Unemployed at this present time   Right handed   One story home   Highest level of education:  12th grade   Social Determinants of Health   Financial Resource Strain: Not on file  Food Insecurity: Not on file  Transportation Needs: Not on file  Physical Activity: Not on file  Stress: Not on file  Social Connections: Not on file    Allergies: No Known Allergies  Metabolic Disorder Labs: Lab Results  Component Value Date   HGBA1C 6.0 (A) 01/31/2020   MPG 137 05/27/2019   MPG 137 12/29/2018   No results found for: PROLACTIN Lab Results  Component Value Date   CHOL 195 05/27/2019   TRIG 92 05/27/2019   HDL 44 05/27/2019   CHOLHDL 4.4 05/27/2019   VLDL 10 12/04/2016   LDLCALC 131 (H) 05/27/2019   LDLCALC 128 (H) 12/29/2018   Lab Results  Component Value Date   TSH 1.93 06/08/2019   TSH 2.14 12/29/2018    Therapeutic Level Labs: No results found for: LITHIUM No results found for: VALPROATE No components found for:  CBMZ  Current Medications: Current Outpatient Medications  Medication Sig Dispense Refill  . Brexpiprazole (REXULTI) 3 MG TABS Take 1 tablet (3 mg total) by mouth at bedtime. 30 tablet 0  . eszopiclone (LUNESTA) 2 MG TABS tablet Take 1 tablet (2 mg total) by mouth at bedtime as needed for sleep. Take immediately before bedtime 30 tablet 0  . gabapentin (NEURONTIN) 300 MG capsule Take 1 tablet at bedtime for one week, then increase to 1 tablet twice daily. 60 capsule 3  . loratadine (CLARITIN) 10 MG tablet Take 1 tablet (10 mg total) by mouth daily. 90 tablet 3  . mometasone (NASONEX) 50 MCG/ACT nasal spray PLACE 2 SPRAYS INTO THE NOSE DAILY. 34 g 0  . mometasone (NASONEX) 50  MCG/ACT nasal spray Place 2 sprays into the nose daily. 51 g 1  . naproxen (NAPROSYN) 500 MG tablet as needed.    . traMADol (ULTRAM) 50 MG tablet Take 50 mg by mouth 2 (two) times daily.    Marland Kitchen tretinoin (RETIN-A) 0.025 % cream Apply 1 application topically at bedtime.    Marland Kitchen venlafaxine XR (EFFEXOR-XR) 150 MG 24 hr capsule 225 mg daily (75 mg +150 mg ) 90 capsule 1  . venlafaxine XR (EFFEXOR-XR) 75 MG 24 hr capsule 225 mg daily (75 mg +  150 mg ) 90 capsule 1   No current facility-administered medications for this visit.     Musculoskeletal: Strength & Muscle Tone: N/A Gait & Station: N/A Patient leans: N/A  Psychiatric Specialty Exam: Review of Systems  Psychiatric/Behavioral: Positive for decreased concentration, dysphoric mood and sleep disturbance. Negative for agitation, behavioral problems, confusion, hallucinations, self-injury and suicidal ideas. The patient is nervous/anxious. The patient is not hyperactive.   All other systems reviewed and are negative.   There were no vitals taken for this visit.There is no height or weight on file to calculate BMI.  General Appearance: Fairly Groomed  Eye Contact:  Good  Speech:  delay in latency  Volume:  Normal  Mood:  Depressed  Affect:  Appropriate, Blunt and Congruent  Thought Process:  Coherent  Orientation:  Full (Time, Place, and Person)  Thought Content: Logical   Suicidal Thoughts:  No  Homicidal Thoughts:  No  Memory:  Immediate;   Good  Judgement:  Good  Insight:  Fair  Psychomotor Activity:  Normal  Concentration:  Concentration: Poor and Attention Span: Poor  Recall:  Good  Fund of Knowledge: Good  Language: Good  Akathisia:  No  Handed:  Right  AIMS (if indicated): not done  Assets:  Communication Skills Desire for Improvement  ADL's:  Intact  Cognition: WNL  Sleep:  Fair   Screenings: GAD-7   Flowsheet Row Office Visit from 12/29/2018 in [facility] Office Visit from 10/28/2018 in [facility]  [facility] Office Visit from 01/28/2018 in [facility] Phone Follow Up from 01/08/2018 in [facility] Phone Follow Up from 12/04/2017 in [facility]  Total GAD-7 Score 21 20 15 10 15     PHQ2-9   Flowsheet Row Office Visit from 01/31/2020 in [facility] Counselor from [facility]2019 [facility] Office Visit from 05/31/2019 in [facility] Counselor from 05/18/2019 in [facility] Counselor from 02/15/2019 in [facility]  PHQ-2 Total Score 6 6 6 6 6   PHQ-9 Total Score 22 24 22 22 22        Assessment and Plan:  Nial Hawe is a 44 y.o. year old male with a history of depression,bilateral carpel tunnel syndrome, who presents for follow up appointment for below.   1. Current severe episode of major depressive disorder without psychotic features without prior episode [facility]) Exam is notable for slight delay in speech, and he continues to report depressive symptoms since the last visit.  Psychosocial stressors includes loss of his maternal aunt.  Other psychosocial stressors includes unemployment, and neuropathy.    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   We will continue venlafaxine to target depression.  We will continue rexulti as adjunctive treatment for depression.  Previously discussed its potential metabolic side effect and EPS.   2. Insomnia, unspecified type He reports improvement in and insomnia since up titration from [place].  We will continue the current dose at this time to target insomnia. They will contact his neurologist regarding his drowsiness, which they attribute to recent increase in belbuca. Will consider  tapering down Lunesta if his drowsiness continues despite change in belbuca.   Plan I have reviewed and updated plans as below 1.Continuevenlafaxine 225mg  (150 mg + 75 mg)daily 2.ContinueRexulti3mg at night - monitor drowsiness 3.  Continue Lunesta 2 mg at night as needed for sleep 4.Next appointment-2/7 at 4 PM for [email or web address] - on tramadol, Belbuca 750 Mcg Film  Past trials of medication:sertraline, fluoxetine,mirtazapine, nortriptyline for carpel tunnel syndrome,bupropion (drowsiness),quetiapine (drowsiness),Abilify ("off balance"),Trazodone, Ambien (limited benefit),lorazepam (worsening in anxiety, drowsiness)  The patient demonstrates the following risk factors for suicide: Chronic risk factors for suicide include:psychiatric disorder ofdepression. Acute risk factorsfor suicide include: unemployment and loss (financial, interpersonal, professional). Protective factorsfor this patient include: positive social support, responsibility to others (children, family) and hope for the future. Considering these factors, the overall suicide risk at this point appears to below. Patientisappropriate for outpatient follow up.   Neysa Hottereina Kemani Heidel, MD 04/27/2020, 4:20 PM

## 2020-04-27 ENCOUNTER — Other Ambulatory Visit: Payer: Self-pay

## 2020-04-27 ENCOUNTER — Encounter: Payer: Self-pay | Admitting: Psychiatry

## 2020-04-27 ENCOUNTER — Telehealth (INDEPENDENT_AMBULATORY_CARE_PROVIDER_SITE_OTHER): Payer: 59 | Admitting: Psychiatry

## 2020-04-27 DIAGNOSIS — F322 Major depressive disorder, single episode, severe without psychotic features: Secondary | ICD-10-CM | POA: Diagnosis not present

## 2020-04-27 DIAGNOSIS — G4700 Insomnia, unspecified: Secondary | ICD-10-CM | POA: Diagnosis not present

## 2020-04-27 NOTE — Patient Instructions (Signed)
1.Continuevenlafaxine 225mg  (150 mg + 75 mg)daily 2.ContinueRexulti3mg at night  3. Conitnue Lunesta 2 mg at night as needed for sleep 4.Next appointment-2/7 at 4 PM

## 2020-04-30 DIAGNOSIS — H52223 Regular astigmatism, bilateral: Secondary | ICD-10-CM | POA: Diagnosis not present

## 2020-05-03 ENCOUNTER — Other Ambulatory Visit: Payer: Self-pay

## 2020-05-03 ENCOUNTER — Ambulatory Visit (INDEPENDENT_AMBULATORY_CARE_PROVIDER_SITE_OTHER): Payer: 59 | Admitting: Psychiatry

## 2020-05-03 DIAGNOSIS — F322 Major depressive disorder, single episode, severe without psychotic features: Secondary | ICD-10-CM

## 2020-05-03 NOTE — Progress Notes (Signed)
Virtual Visit via Telephone Note  I connected with [name] on 05/03/20 at 10:15 AM EST  by telephone and verified that I am speaking with the correct person using two identifiers.  Location: Patient: Home Provider: [facility] Outpatient [place] office    I discussed the limitations, risks, security and privacy concerns of performing an evaluation and management service by telephone and the availability of in person appointments. I also discussed with the patient that there may be a patient responsible charge related to this service. The patient expressed understanding and agreed to proceed.   I provided 32 minutes of non-face-to-face time during this encounter.   [name], LCSW   THERAPIST PROGRESS NOTE  Location: Patient- Home/Provider [facility] Outpatient [place] office   Session Time:  Wednesday 05/03/2020 10:15 AM 10:48 AM    Participation Level: Active  Behavioral Response: AlertDepressed/  Type of Therapy: Individual Therapy  Treatment Goals addressed:  alleviate depressive symptoms and resume normal involvement and activity and social activity, learn and implement cognitive and behavioral strategies to overcome depression,    Interventions: CBT and Supportive  Summary: Dontai Pember is a 44 y.o. male who is referred for services by PCP Dr. Syliva Overman due to patient experiencing symptoms of depression. He denies any psychiatric hospitalizations, He saw therapist Dorann Lodge in [place] for a few months. Patient reports symptoms began when he lost his job about a year ago. Per his report, he was fired after he complained about race discrimination. 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 He states now feeling as though he failed his family. He states not wanting to be around anyone and having difficulty engaging with his wife and  children. He fears losing his family.  He reports ruminating thoughts about job and the past, excessive worry depressed mood, tearfulness, anxiety, isolative behaviors, poor motivation, poor concentration, irritability, sleep difficulty, thoughts and feelings of hopelessness and worthlessness. He denies any suicidal ideations.  Patient last was seen via virtual visit about 3 weeks ago. He continues to experience  moderate symptoms of depression including  fatigue,memory difficulty, ruminating thoughts, and poor motivation.  He says his mood has been up and down.  He reports increased anxiety as his 55-year-old son was diagnosed with COVID-19 yesterday.  Prior to then, he reports he implemented plan developed in last session regarding increased interaction with children.  Per his report, he increased interaction to at least 3 or 4 times since last session.  He reports feeling better after he had interaction with his children.  He also states it was productive and reports he did not experience any emotional pain while interacting with his children.  SI/HI .Nowithout intent/plan  Therapist Response:  reviewed symptoms, praised and reinforced patient's efforts to implement plan developed in last session, discussed effects, developed plan with patient to increase interaction to 7-8 times before next session, also assisted patient identify an activity to do with his 59-year-old son, developed plan with patient to play Juanna Cao with his 31-year-old son once by next session,reiterated ACT principles ( DARE -defusion, acceptance of discomfort, realistic goals, and embracing values ) to cope with painful feelings and thoughts, discussed stressors, facilitated expression of thoughts and feelings, validated feelings   Plan: Return again in 1-2  weeks  Diagnosis: Axis I: MDD, single episode, severe    Axis II:  No diagnosis    [name], LCSW 05/03/2020

## 2020-05-09 MED FILL — GENTAMICIN 0.1% OINTMENT: 0.1 | 21 days supply | Qty: 15 | Fill #1

## 2020-05-14 ENCOUNTER — Encounter: Payer: Self-pay | Admitting: Family Medicine

## 2020-05-15 ENCOUNTER — Telehealth (INDEPENDENT_AMBULATORY_CARE_PROVIDER_SITE_OTHER): Payer: 59 | Admitting: Internal Medicine

## 2020-05-15 ENCOUNTER — Other Ambulatory Visit: Payer: Self-pay

## 2020-05-15 ENCOUNTER — Other Ambulatory Visit: Payer: Self-pay | Admitting: Internal Medicine

## 2020-05-15 DIAGNOSIS — U071 COVID-19: Secondary | ICD-10-CM

## 2020-05-15 MED ORDER — SALINE NASAL SPRAY 0.65 % NA SOLN: 1.0000 | NASAL | 12 refills | Status: DC | PRN

## 2020-05-15 MED ORDER — MUCINEX DM MAXIMUM STRENGTH 60-1200 MG PO TB12: 1.0000 | ORAL_TABLET | Freq: Two times a day (BID) | ORAL | 0 refills | Status: DC | PRN

## 2020-05-15 NOTE — Progress Notes (Signed)
Virtual Visit via Telephone Note   This visit type was conducted due to national recommendations for restrictions regarding the COVID-19 Pandemic (e.g. social distancing) in an effort to limit this patient's exposure and mitigate transmission in our community.  Due to his co-morbid illnesses, this patient is at least at moderate risk for complications without adequate follow up.  This format is felt to be most appropriate for this patient at this time.  The patient did not have access to video technology/had technical difficulties with video requiring transitioning to audio format only (telephone).  All issues noted in this document were discussed and addressed.  No physical exam could be performed with this format.  Evaluation Performed:  Follow-up visit  Date:  05/15/2020   ID:  Gabriel [name], DOB [birth year], MRN [id number]  Patient Location: Home Provider Location: Office/Clinic  Participants: Patient Location of Patient: Home Location of Provider: Telehealth Consent was obtain for visit to be over via telehealth. I verified that I am speaking with the correct person using two identifiers.  PCP:  Kerri Perches, MD   Chief Complaint:  COVID positive  History of Present Illness:    Gabriel [name] is a 45 y.o. male who has a televisit for c/o nasal congestion, nausea and headache for last 3-4 days. He tested positive for COVID yesterday. He c/o fatigue along with headache and has intermittent cough. He also c/o loose BM since yesterday. Has not tried medications at home. He has had 2 doses of COVID vaccine. He denies fever, dyspnea, chest pain or palpitations.  The patient does have symptoms concerning for COVID-19 infection (fever, chills, cough, or new shortness of breath).   Past Medical, Surgical, Social History, Allergies, and Medications have been Reviewed.  Past Medical History:  Diagnosis Date  . Anxiety   . Carpal tunnel syndrome 2018  . Depression   .  Depression    Phreesia 05/15/2020  . Elevated LFTs   . Heart murmur   . Insomnia   . Seasonal allergies   . Vitamin D deficiency    Past Surgical History:  Procedure Laterality Date  . NO PAST SURGERIES       Current Meds  Medication Sig  . Brexpiprazole (REXULTI) 3 MG TABS Take 1 tablet (3 mg total) by mouth at bedtime.  . eszopiclone (LUNESTA) 2 MG TABS tablet Take 1 tablet (2 mg total) by mouth at bedtime as needed for sleep. Take immediately before bedtime  . gabapentin (NEURONTIN) 300 MG capsule Take 1 tablet at bedtime for one week, then increase to 1 tablet twice daily.  . mometasone (NASONEX) 50 MCG/ACT nasal spray PLACE 2 SPRAYS INTO THE NOSE DAILY.  . mometasone (NASONEX) 50 MCG/ACT nasal spray Place 2 sprays into the nose daily.  . naproxen (NAPROSYN) 500 MG tablet as needed.  . traMADol (ULTRAM) 50 MG tablet Take 50 mg by mouth 2 (two) times daily.  Marland Kitchen tretinoin (RETIN-A) 0.025 % cream Apply 1 application topically at bedtime.  Marland Kitchen venlafaxine XR (EFFEXOR-XR) 150 MG 24 hr capsule 225 mg daily (75 mg +150 mg )  . venlafaxine XR (EFFEXOR-XR) 75 MG 24 hr capsule 225 mg daily (75 mg +150 mg )     Allergies:   Patient has no known allergies.   ROS:   Please see the history of present illness.     Review of Systems  Constitutional: Positive for chills and malaise/fatigue. Negative for fever.  HENT: Positive for congestion. Negative for ear discharge, ear  pain, sinus pain and sore throat.   Eyes: Negative for pain and redness.  Respiratory: Positive for cough. Negative for hemoptysis and shortness of breath.   Cardiovascular: Negative for chest pain and palpitations.  Gastrointestinal: Positive for diarrhea and nausea. Negative for blood in stool, constipation, melena and vomiting.  Genitourinary: Negative for dysuria and hematuria.  Musculoskeletal: Positive for myalgias.  Neurological: Positive for headaches. Negative for dizziness, sensory change and focal weakness.       Labs/Other Tests and Data Reviewed:    Recent Labs: 06/08/2019: BUN 12; Creatinine, Ser 1.11; Hemoglobin 15.7; Platelets 373.0; Potassium 3.6; Sodium 138; TSH 1.93 08/19/2019: ALT 47   Recent Lipid Panel Lab Results  Component Value Date/Time   CHOL 195 05/27/2019 07:11 AM   TRIG 92 05/27/2019 07:11 AM   HDL 44 05/27/2019 07:11 AM   CHOLHDL 4.4 05/27/2019 07:11 AM   LDLCALC 131 (H) 05/27/2019 07:11 AM    Wt Readings from Last 3 Encounters:  01/31/20 205 lb (93 kg)  06/08/19 221 lb 6 oz (100.4 kg)  05/31/19 215 lb (97.5 kg)      ASSESSMENT & PLAN:    COVID-19 infection Advised to continue to self-quarantine Mucinex PRN Tylenol PRN Advised to use nasal saline spray Advised to increase fluid intake for nausea/vomiting/diarrhea to avoid dehydration Advised to go to ER if he has dyspnea/hemoptysis  Time:   Today, I have spent 22 minutes reviewing the chart, including problem list, medications, and with the patient with telehealth technology discussing the above problems.   Medication Adjustments/Labs and Tests Ordered: Current medicines are reviewed at length with the patient today.  Concerns regarding medicines are outlined above.   Tests Ordered: No orders of the defined types were placed in this encounter.   Medication Changes: No orders of the defined types were placed in this encounter.    Note: This dictation was prepared with Dragon dictation along with smaller phrase technology. Similar sounding words can be transcribed inadequately or may not be corrected upon review. Any transcriptional errors that result from this process are unintentional.      Disposition:  Follow up  Signed, Anabel Halon, MD  05/15/2020 9:05 AM     [facility]

## 2020-05-15 NOTE — Telephone Encounter (Signed)
Pt made a phone visit with dr patel 05/15/20

## 2020-05-15 NOTE — Patient Instructions (Signed)
Please use nasal saline spray for nasal congestion/stuffiness.  Please take Mucinex as prescribed or Advil cold and sinus for congestion symptoms/cough.  Please take Tylenol for fever/muscle aches up to 3 times in a day.  Please self-quarantine for at least 10 days from the positive test result or until you are afebrile for 24 hours, whichever is later.  Please stay hydrated by taking at least 2-2.5 liters of fluid in a day.  Please go to ER if you have shortness of breath or notice blood in cough.

## 2020-05-17 ENCOUNTER — Telehealth (HOSPITAL_COMMUNITY): Payer: Self-pay | Admitting: Psychiatry

## 2020-05-17 ENCOUNTER — Other Ambulatory Visit: Payer: Self-pay

## 2020-05-17 ENCOUNTER — Ambulatory Visit (HOSPITAL_COMMUNITY): Payer: 59 | Admitting: Psychiatry

## 2020-05-17 NOTE — Telephone Encounter (Signed)
Therapist called patient for scheduled appointment.  However, patient reports not feeling well as he has just been diagnosed with Covid 19.  Therapist and patient agreed to reschedule.

## 2020-05-18 ENCOUNTER — Other Ambulatory Visit: Payer: Self-pay | Admitting: Psychiatry

## 2020-05-18 ENCOUNTER — Telehealth: Payer: Self-pay

## 2020-05-18 MED ORDER — ESZOPICLONE 2 MG PO TABS: 2.0000 mg | ORAL_TABLET | Freq: Every evening | ORAL | 1 refills | Status: DC | PRN

## 2020-05-18 MED FILL — REXULTI 3 MG TABLET: 3 | 30 days supply | Qty: 30 | Fill #1

## 2020-05-18 MED FILL — ESZOPICLONE 2 MG TAB: 2 | 30 days supply | Qty: 30 | Fill #0

## 2020-05-18 NOTE — Telephone Encounter (Signed)
Medication refill request - Fax received requesting a refill of patient's Eszopiclone. Last seen 04/27/20 and returns 06/19/20.

## 2020-05-18 NOTE — Telephone Encounter (Signed)
Ordered.   I have utilized the [place] Controlled Substances Reporting System (PMP AWARxE) to confirm adherence regarding the patient's medication. My review reveals appropriate prescription fills.

## 2020-05-25 ENCOUNTER — Ambulatory Visit: Payer: 59 | Attending: Internal Medicine

## 2020-05-25 DIAGNOSIS — Z23 Encounter for immunization: Secondary | ICD-10-CM

## 2020-05-25 NOTE — Progress Notes (Signed)
   Covid-19 Vaccination Clinic  Name:  Gabriel [name]    MRN: [id number] DOB: Jun 13, 1975  05/25/2020  Gabriel [name] was observed post Covid-19 immunization for 15 minutes without incident. He was provided with Vaccine Information Sheet and instruction to access the V-Safe system.   Gabriel [name] was instructed to call 911 with any severe reactions post vaccine: Marland Kitchen Difficulty breathing  . Swelling of face and throat  . A fast heartbeat  . A bad rash all over body  . Dizziness and weakness   Immunizations Administered    Name Date Dose VIS Date Route   Moderna Covid-19 Booster Vaccine 05/25/2020  1:52 PM 0.25 mL 03/01/2020 Intramuscular   Manufacturer: Moderna   Lot: [id number]   NDC: 19622-297-98

## 2020-06-09 DIAGNOSIS — G5601 Carpal tunnel syndrome, right upper limb: Secondary | ICD-10-CM | POA: Diagnosis not present

## 2020-06-09 DIAGNOSIS — G5602 Carpal tunnel syndrome, left upper limb: Secondary | ICD-10-CM | POA: Diagnosis not present

## 2020-06-13 NOTE — Progress Notes (Signed)
Virtual Visit via Video Note  I connected with Gabriel [name] on 06/19/20 at  4:00 PM EST by a video enabled telemedicine application and verified that I am speaking with the correct person using two identifiers.  Location: Patient: home Provider: office,  Persons participated in the visit- patient, provider, wife   I discussed the limitations of evaluation and management by telemedicine and the availability of in person appointments. The patient expressed understanding and agreed to proceed.     I discussed the assessment and treatment plan with the patient. The patient was provided an opportunity to ask questions and all were answered. The patient agreed with the plan and demonstrated an understanding of the instructions.   The patient was advised to call back or seek an in-person evaluation if the symptoms worsen or if the condition fails to improve as anticipated.  I provided 17 minutes of non-face-to-face time during this encounter.   [name], MD    [facility] MD/PA/NP OP Progress Note  06/19/2020 4:50 PM Gabriel [name]  MRN:  [id number]  Chief Complaint:  Chief Complaint    Follow-up; Depression     HPI:  This is a follow-up appointment for depression.  He states that he has been doing a little better.  He has been able to come out of the room more, and enjoy the time with his children.  He has not been able to go outside so much, which he attributes to leg pain.  He agrees that he used to be active despite pain before he struggles with depression.  He used to enjoy playing sports, and spending more time with his children.  He had COVID in January.  He has had a headache and fatigue. He sleeps five hours with middle insomnia.   His appetite has been improving, although he had appetite loss when he had COVID.  He has difficulty in concentration.  He denies SI.   Gabriel [name], his wife presents to the interview.  She thinks that he has been doing better.  He does not need  prompting as much, and he is taking more initiative.  He was able to interact with son, and played games last weekend on his birthday. He has daytime drowsiness. His neurologist recommended him to try lower dose of Belbuca; he will try 600 mg when it is approved.  Although he is still drowsy, the plan is to reduce the dose of Belbuca once it is approved.   Daily routine:stays in the house Employment:unemployed. 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 He was raised by his mother. He reports good relationship with her, who taught him "love and respect." He has fair relationship with his father   Visit Diagnosis:    ICD-10-CM   1. Current severe episode of major depressive disorder without psychotic features without prior episode (HCC)  F32.2   2. Insomnia, unspecified type  G47.00   3. Current moderate episode of major depressive disorder without prior episode (HCC)  F32.1 venlafaxine XR (EFFEXOR-XR) 150 MG 24 hr capsule    venlafaxine XR (EFFEXOR-XR) 75 MG 24 hr capsule  4. Anxiety  F41.9 venlafaxine XR (EFFEXOR-XR) 150 MG 24 hr capsule    venlafaxine XR (EFFEXOR-XR) 75 MG 24 hr capsule    Past Psychiatric History: Please see initial evaluation for full details. I have reviewed the history. No updates at  this time.     Past Medical History:  Past Medical History:  Diagnosis Date  . Anxiety   . Carpal tunnel syndrome 2018  . Depression   . Depression    Phreesia 05/15/2020  . Elevated LFTs   . Heart murmur   . Insomnia   . Seasonal allergies   . Vitamin D deficiency     Past Surgical History:  Procedure Laterality Date  . NO PAST SURGERIES      Family Psychiatric History: Please see initial evaluation for full details. I have reviewed the history. No updates at this time.     Family History:  Family  History  Problem Relation Age of Onset  . Diabetes Father   . Hypertension Father   . Hypertension Mother   . Hypertension Maternal Grandfather   . Diabetes Maternal Grandfather   . Cancer Paternal Grandmother        type unknown  . Hypertension Paternal Grandmother   . Cancer Paternal Grandfather        type unknown  . Hypertension Paternal Grandfather     Social History:  Social History   Socioeconomic History  . Marital status: Married    Spouse name: Not on file  . Number of children: 2  . Years of education: 54  . Highest education level: High school graduate  Occupational History  . Occupation: not employed  Tobacco Use  . Smoking status: Never Smoker  . Smokeless tobacco: Never Used  Vaping Use  . Vaping Use: Never used  Substance and Sexual Activity  . Alcohol use: No    Alcohol/week: 0.0 standard drinks  . Drug use: No  . Sexual activity: Yes    Birth control/protection: None  Other Topics Concern  . Not on file  Social History Narrative   He lives with wife and two children.   Unemployed at this present time   Right handed   One story home   Highest level of education:  12th grade   Social Determinants of Health   Financial Resource Strain: Not on file  Food Insecurity: Not on file  Transportation Needs: Not on file  Physical Activity: Not on file  Stress: Not on file  Social Connections: Not on file    Allergies: No Known Allergies  Metabolic Disorder Labs: Lab Results  Component Value Date   HGBA1C 6.0 (A) 01/31/2020   MPG 137 05/27/2019   MPG 137 12/29/2018   No results found for: PROLACTIN Lab Results  Component Value Date   CHOL 195 05/27/2019   TRIG 92 05/27/2019   HDL 44 05/27/2019   CHOLHDL 4.4 05/27/2019   VLDL 10 12/04/2016   LDLCALC 131 (H) 05/27/2019   LDLCALC 128 (H) 12/29/2018   Lab Results  Component Value Date   TSH 1.93 06/08/2019   TSH 2.14 12/29/2018    Therapeutic Level Labs: No results found for:  LITHIUM No results found for: VALPROATE No components found for:  CBMZ  Current Medications: Current Outpatient Medications  Medication Sig Dispense Refill  . Brexpiprazole (REXULTI) 3 MG TABS Take 1 tablet (3 mg total) by mouth at bedtime. 30 tablet 1  . Dextromethorphan-guaiFENesin (MUCINEX DM MAXIMUM STRENGTH) 60-1200 MG TB12 Take 1 tablet by mouth 2 (two) times daily as needed. 28 tablet 0  . [START ON 07/15/2020] eszopiclone (LUNESTA) 2 MG TABS tablet Take 1 tablet (2 mg total) by mouth at bedtime as needed for sleep. Take immediately before bedtime 30 tablet 0  . gabapentin (  NEURONTIN) 300 MG capsule Take 1 tablet at bedtime for one week, then increase to 1 tablet twice daily. 60 capsule 3  . loratadine (CLARITIN) 10 MG tablet Take 1 tablet (10 mg total) by mouth daily. 90 tablet 3  . mometasone (NASONEX) 50 MCG/ACT nasal spray PLACE 2 SPRAYS INTO THE NOSE DAILY. 34 g 0  . mometasone (NASONEX) 50 MCG/ACT nasal spray Place 2 sprays into the nose daily. 51 g 1  . naproxen (NAPROSYN) 500 MG tablet as needed.    . sodium chloride (OCEAN) 0.65 % nasal spray Place 1 spray into the nose as needed for congestion. 30 mL 12  . traMADol (ULTRAM) 50 MG tablet Take 50 mg by mouth 2 (two) times daily.    Marland Kitchen tretinoin (RETIN-A) 0.025 % cream Apply 1 application topically at bedtime.    Marland Kitchen venlafaxine XR (EFFEXOR-XR) 150 MG 24 hr capsule 225 mg daily (75 mg +150 mg ) 90 capsule 0  . venlafaxine XR (EFFEXOR-XR) 75 MG 24 hr capsule 225 mg daily (75 mg +150 mg ) 90 capsule 0   No current facility-administered medications for this visit.     Musculoskeletal: Strength & Muscle Tone: N/A Gait & Station: N/A Patient leans: N/A  Psychiatric Specialty Exam: Review of Systems  Psychiatric/Behavioral: Positive for decreased concentration, dysphoric mood and sleep disturbance. Negative for agitation, behavioral problems, confusion, hallucinations, self-injury and suicidal ideas. The patient is nervous/anxious.  The patient is not hyperactive.   All other systems reviewed and are negative.   There were no vitals taken for this visit.There is no height or weight on file to calculate BMI.  General Appearance: Fairly Groomed  Eye Contact:  Good  Speech:  small volume, less increased latency  Volume:  Normal  Mood:  Depressed  Affect:  Appropriate, Congruent, Restricted and down  Thought Process:  Coherent  Orientation:  Full (Time, Place, and Person)  Thought Content: Logical   Suicidal Thoughts:  No  Homicidal Thoughts:  No  Memory:  Immediate;   Good  Judgement:  Good  Insight:  Fair  Psychomotor Activity:  Normal  Concentration:  Concentration: Fair and Attention Span: Fair  Recall:  Good  Fund of Knowledge: Good  Language: Good  Akathisia:  No  Handed:  Right  AIMS (if indicated): not done  Assets:  Communication Skills Desire for Improvement  ADL's:  Intact  Cognition: WNL  Sleep:  Poor   Screenings: GAD-7   Flowsheet Row Office Visit from 12/29/2018 in [facility] Office Visit from 10/28/2018 in [facility] Office Visit from 01/28/2018 in [facility] Phone Follow Up from 01/08/2018 in [facility] Phone Follow Up from 12/04/2017 in [facility]  Total GAD-7 Score 21 20 15 10 15     PHQ2-9   Flowsheet Row Video Visit from 05/15/2020 in [facility] Office Visit from 01/31/2020 in [facility] Counselor from [facility]/10[facility]2019 [facility] Office Visit from 05/31/2019 in [facility] Counselor from 05/18/2019 in [facility]  PHQ-2 Total Score 0 6 6 6 6   PHQ-9 Total Score -- 22 24 22 22     Flowsheet Row Virtual [facility] Phone Follow Up from 01/08/2018 in [facility] Virtual [facility] Phone Follow Up from 12/04/2017 in [facility] Phone Follow Up from 10/24/2017 in [facility]   C-SSRS RISK CATEGORY No Risk No Risk No Risk  Assessment and Plan:  [place] Espina is a 45 y.o. year old male with a history of depression,bilateral carpel tunnel syndrome, who presents for follow up appointment for below.   1. Current severe episode of major depressive disorder without psychotic features without prior episode [facility]) Exam is notable for less latency in speech, and he reports slight improvement in depressive symptoms since the last visit.  Psychosocial stressors includes loss of his maternal aunt, and unemployment, neuropathy.  He is now agreeable for ECT referral given his treatment resistant depression/adverse reaction from other psychotropics.  We will continue his current medication regimen at this time.  We will continue venlafaxine to target depression.  Discussed potential risk of serotonin syndrome with concomitant use of  Tramadol.  Will continue rexulti as adjunctive treatment for depression.  Discussed potential metabolic side effect and EPS.   2. Insomnia, unspecified type He has had some benefit from [place].  Noted that he has had drowsiness, which he attributes to [facility]; this medication will be tapered down by his provider.  Will continue current dose of Lunesta at this time for insomnia; will consider tapering down the dose if he continues to have drowsiness despite tapering down Belbuca.   Plan I have reviewed and updated plans as below 1.Continuevenlafaxine 225mg  (150 mg + 75 mg)daily 2.ContinueRexulti3mg at night - monitor drowsiness 3.ContinueLunesta 2mg  at night as needed for sleep 4.Next appointment-3/14 at 3 PM for [email or web address] - Referral to ECT - on tramadol, Belbuca 750 Mcg Film  Noted that according to the pharmacy, venlafaxine was filled on 10/28 for 90days, and rexulti on 1/6 for 30 days. Will continue to monitor for medication adherence.   Past trials of medication:sertraline,  fluoxetine,mirtazapine, nortriptyline for carpel tunnel syndrome,bupropion (drowsiness),quetiapine (drowsiness),Abilify ("off balance"),Trazodone, Ambien (limited benefit),lorazepam (worsening in anxiety, drowsiness)  The patient demonstrates the following risk factors for suicide: Chronic risk factors for suicide include:psychiatric disorder ofdepression. Acute risk factorsfor suicide include: unemployment and loss (financial, interpersonal, professional). Protective factorsfor this patient include: positive social support, responsibility to others (children, family) and hope for the future. Considering these factors, the overall suicide risk at this point appears to below. Patientisappropriate for outpatient follow up.   , MD 06/19/2020, 4:50 PM

## 2020-06-15 MED FILL — traMADol HCL 50 MG TABS: 50 | 23 days supply | Qty: 180 | Fill #1

## 2020-06-19 ENCOUNTER — Telehealth (INDEPENDENT_AMBULATORY_CARE_PROVIDER_SITE_OTHER): Payer: 59 | Admitting: Psychiatry

## 2020-06-19 ENCOUNTER — Other Ambulatory Visit: Payer: Self-pay | Admitting: Psychiatry

## 2020-06-19 ENCOUNTER — Other Ambulatory Visit: Payer: Self-pay

## 2020-06-19 ENCOUNTER — Encounter: Payer: Self-pay | Admitting: Psychiatry

## 2020-06-19 DIAGNOSIS — G4700 Insomnia, unspecified: Secondary | ICD-10-CM | POA: Diagnosis not present

## 2020-06-19 DIAGNOSIS — F322 Major depressive disorder, single episode, severe without psychotic features: Secondary | ICD-10-CM | POA: Diagnosis not present

## 2020-06-19 DIAGNOSIS — F419 Anxiety disorder, unspecified: Secondary | ICD-10-CM | POA: Diagnosis not present

## 2020-06-19 DIAGNOSIS — F321 Major depressive disorder, single episode, moderate: Secondary | ICD-10-CM

## 2020-06-19 MED ORDER — REXULTI 3 MG PO TABS: 3.0000 mg | ORAL_TABLET | Freq: Every day | ORAL | 1 refills | Status: DC

## 2020-06-19 MED ORDER — VENLAFAXINE HCL ER 150 MG PO CP24
ORAL_CAPSULE | ORAL | 0 refills | Status: DC
Start: 2020-06-19 — End: 2020-06-19

## 2020-06-19 MED ORDER — ESZOPICLONE 2 MG PO TABS: 2.0000 mg | ORAL_TABLET | Freq: Every evening | ORAL | 0 refills | Status: DC | PRN

## 2020-06-19 MED ORDER — VENLAFAXINE HCL ER 75 MG PO CP24
ORAL_CAPSULE | ORAL | 0 refills | Status: DC
Start: 2020-06-19 — End: 2020-06-19

## 2020-06-19 MED FILL — REXULTI 3 MG TABLET: 3 | 30 days supply | Qty: 30 | Fill #0

## 2020-06-19 MED FILL — VENLAFAXINE HCL ER 150 MG C: 150 | 90 days supply | Qty: 90 | Fill #0

## 2020-06-19 MED FILL — VENLAFAXINE HCL ER 75 MG CA: 75 | 90 days supply | Qty: 90 | Fill #0

## 2020-06-21 MED FILL — BELBUCA 600 MCG FILM: 600 | 30 days supply | Qty: 60 | Fill #0

## 2020-06-23 ENCOUNTER — Other Ambulatory Visit: Payer: Self-pay

## 2020-06-23 ENCOUNTER — Ambulatory Visit (INDEPENDENT_AMBULATORY_CARE_PROVIDER_SITE_OTHER): Payer: 59 | Admitting: Psychiatry

## 2020-06-23 DIAGNOSIS — F322 Major depressive disorder, single episode, severe without psychotic features: Secondary | ICD-10-CM | POA: Diagnosis not present

## 2020-06-23 NOTE — Progress Notes (Signed)
Virtual Visit via Video Note  I connected with Gabriel [name] on 06/23/20 at 9:06 AM EST  by a video enabled telemedicine application and verified that I am speaking with the correct person using two identifiers.  Location: Patient: Home Provider:  [facility] [place] office    I discussed the limitations of evaluation and management by telemedicine and the availability of in person appointments. The patient expressed understanding and agreed to proceed.  I provided 49 minutes of non-face-to-face time during this encounter.   [name], LCSW  THERAPIST PROGRESS NOTE    Session Time:  Friday 06/23/2020 9:06 AM - 9:54 AM    Participation Level: Active  Behavioral Response: AlertDepressed/  Type of Therapy: Individual Therapy  Treatment Goals addressed:  alleviate depressive symptoms and resume normal involvement and activity and social activity, learn and implement cognitive and behavioral strategies to overcome depression,    Interventions: CBT and Supportive  Summary: Gabriel [name] is a 45 y.o. male who is referred for services by PCP Dr. Syliva Overman due to patient experiencing symptoms of depression. He denies any psychiatric hospitalizations, He saw therapist Dorann Lodge in [place] for a few months. Patient reports symptoms began when he lost his job about a year ago. Per his report, he was fired after he complained about race discrimination. 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 He states now feeling as though he failed his family. He states not wanting to be around anyone and having difficulty engaging with his wife and children. He fears losing his family.  He reports ruminating thoughts about job and the past, excessive worry depressed mood, tearfulness, anxiety, isolative behaviors, poor motivation, poor concentration,  irritability, sleep difficulty, thoughts and feelings of hopelessness and worthlessness. He denies any suicidal ideations.  Patient last was seen via virtual visit about 6 weeks ago. He continues to experience  moderate symptoms of depression including  fatigue,memory difficulty, ruminating negative thoughts about self.  He was diagnosed with Covid in January and has recovered.  However, he reports residual effects of frequent headaches.  This along with neuropathy negatively affects his ability to be more active.  However, he reports being a little bit more active by spending less time in his bedroom and going to sit in the den 3 to 4 days/week for about a couple of hours.  He reports watching TV and spending time with his wife.  He reports he did remember plan oh no with his son as agreed upon in last session and says this went well.  He reports not having any negative feelings or thoughts about self while doing this.  Patient shares he and his wife are scheduled for a consult to discuss possible ECT as recommended by psychiatrist Dr. Vanetta Shawl.  Patient reports he fears he may have a seizure and shares that his uncle had seizures and died from this.  SI/HI .Nowithout intent/plan  Therapist Response:  reviewed symptoms, administered PHQ-9/CSS RS, praised and reinforced patient's efforts to implement plan developed in last session and spending more time in the den with his wife, discussed effects, developed plan with patient to talk with sons about their day 5 days/week/play UNO with his sons 1 time per week before next session, reiterated ACT principles cope with painful thoughts and feelings, facilitated patient expressing thoughts and feelings about consult for  ECT, validated feelings,  encouraged patient to express his concerns and fears to the specialist, also encouraged patient to work with wife to identify questions for specialist prior to visit  Plan: Return again in 1-2  weeks  Diagnosis: Axis I: MDD,  single episode, severe    Axis II: No diagnosis    [name], LCSW 06/23/2020

## 2020-07-03 ENCOUNTER — Ambulatory Visit (INDEPENDENT_AMBULATORY_CARE_PROVIDER_SITE_OTHER): Payer: 59 | Admitting: Psychiatry

## 2020-07-03 ENCOUNTER — Other Ambulatory Visit: Payer: Self-pay

## 2020-07-03 DIAGNOSIS — F332 Major depressive disorder, recurrent severe without psychotic features: Secondary | ICD-10-CM | POA: Diagnosis not present

## 2020-07-04 NOTE — Progress Notes (Signed)
Virtual Visit via Telephone Note  I connected with Gabriel [name] on 07/04/20 at  1:00 PM EST by telephone and verified that I am speaking with the correct person using two identifiers.  Location: Patient: Home Provider: Hospital   I discussed the limitations, risks, security and privacy concerns of performing an evaluation and management service by telephone and the availability of in person appointments. I also discussed with the patient that there may be a patient responsible charge related to this service. The patient expressed understanding and agreed to proceed.   History of Present Illness: This is a ECT consult for this 45 year old man with a history of recurrent depression who was referred by his primary psychiatrist Dr. Vanetta Shawl.  Patient was reached by telephone.  Confirmed her identities.  Discussed the nature of the call.  Patient was agreeable to proceeding.  Patient describes current symptoms of depressed mood present most of the time.  Feels tired and rundown.  Low energy.  Seems to feel hopeless about most things in his life.  Anxious much of the time.  Patient focuses a lot also on his chronic physical problems with peripheral neuropathy and carpal tunnel syndrome.  Patient denies having any suicidal thought intent or plan.  Denies any hallucinations.  He is currently seeing Dr. Vanetta Shawl for psychiatric treatment and is on Effexor and Rexulti as primary medications.  Patient does not feel he has had much improvement on these medicines and so was referred for ECT.  Patient is married lives at home with wife and children.  Not able to work because of current injuries.  Has little activity during the day.  Sleeping much of the time.  Feels hopeless a lot.  Some strain at home with his family.  Medical history: Patient has peripheral neuropathy with tingling and pain in his legs and feet.  Bilateral description of carpal tunnel syndrome.  Substance abuse history: Denies any current  alcohol or drug abuse or past history of alcohol or drug related problems.  Family history: Patient is not aware of any family history of mental illness  Past psychiatric history: No history of hospitalizations.  Has tried several medications and has been seeing his outpatient psychiatrist for a couple of years.  He dates most of his depressive symptoms to having to go out of work because of his medical problems  Mental status exam:    Observations/Objective: Mental status exam: Patient was on time pleasant and polite.  He was oriented to the nature of the discussion.  Affect sounded a little dysphoric.  Described mood is being down and depressed.  No evidence of delusional thinking.  No evidence of gross confusion.  Denies hallucinations.  Denies suicidal or homicidal thought.  Patient appeared to understand the nature of the treatment and was able to ask appropriate questions and appeared able to make reasonable decisions about his care.  Alert and oriented x4.  Short and long-term memory appeared grossly intact.   Assessment and Plan: 45 year old man with a history of recurrent severe major depression without psychotic features.  Referred for electroconvulsive therapy evaluation.  Patient's history of recurrent depression with failure to respond to more than one medication trial and continued impairing symptoms makes him a good candidate without any disqualifying contraindications.  I spent time describing in detail the specifics and general aspects of ECT including the specifics of our treatment here.  Risks and benefits were identified.  Side effects were discussed.  Patient's questions were answered.  In particular he expressed  a concern about whether ECT could cause seizures to happen spontaneously because he said he had family members who had epilepsy.  Patient was told that this is not considered to be a normal side effect and that while there have been cases reported it is not considered to be  something likely to be related to ECT treatment.   Follow Up Instructions: Ultimately patient stated he did not want to pursue electroconvulsive therapy at this time.  His reason was a general fear of the idea of having an epileptic seizure.  Patient's decision was validated.  I briefly discussed with him the existence of transcranial magnetic stimulation and ketamine therapy as well as multiple other medications that could be tried.  Encourage patient to continue seeing his outpatient mental health providers and discuss his concerns with them.  Advised him that our office will always be here if he has more questions or wishes to reconsider ECT in the future.    I discussed the assessment and treatment plan with the patient. The patient was provided an opportunity to ask questions and all were answered. The patient agreed with the plan and demonstrated an understanding of the instructions.   The patient was advised to call back or seek an in-person evaluation if the symptoms worsen or if the condition fails to improve as anticipated.  I provided 45 minutes of non-face-to-face time during this encounter.   Mordecai Rasmussen, MD

## 2020-07-07 ENCOUNTER — Ambulatory Visit (HOSPITAL_COMMUNITY): Payer: 59 | Admitting: Psychiatry

## 2020-07-10 ENCOUNTER — Other Ambulatory Visit: Payer: Self-pay

## 2020-07-10 ENCOUNTER — Ambulatory Visit (INDEPENDENT_AMBULATORY_CARE_PROVIDER_SITE_OTHER): Payer: 59 | Admitting: Psychiatry

## 2020-07-10 DIAGNOSIS — F322 Major depressive disorder, single episode, severe without psychotic features: Secondary | ICD-10-CM

## 2020-07-10 NOTE — Progress Notes (Signed)
Virtual Visit via Video Note  I connected with Gabriel [name] on 07/10/20 at 9:05 AM EST by a video enabled telemedicine application and verified that I am speaking with the correct person using two identifiers.  Location: Patient: Home Provider: [facility] [place] office   I discussed the limitations of evaluation and management by telemedicine and the availability of in person appointments. The patient expressed understanding and agreed to proceed.   I provided 45 minutes of non-face-to-face time during this encounter.   [name], LCSW   THERAPIST PROGRESS NOTE    Session Time:  Monday  07/10/2020 9:05 AM -  9:50 AM    Participation Level: Active  Behavioral Response: AlertDepressed/  Type of Therapy: Individual Therapy  Treatment Goals addressed:  alleviate depressive symptoms and resume normal involvement and activity and social activity, learn and implement cognitive and behavioral strategies to overcome depression,    Interventions: CBT and Supportive  Summary: Gabriel [name] is a 45 y.o. male who is referred for services by PCP Dr. Syliva Overman due to patient experiencing symptoms of depression. He denies any psychiatric hospitalizations, He saw therapist Dorann Lodge in [place] for a few months. Patient reports symptoms began when he lost his job about a year ago. Per his report, he was fired after he complained about race discrimination. 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 He states now feeling as though he failed his family. He states not wanting to be around anyone and having difficulty engaging with his wife and children. He fears losing his family.  He reports ruminating thoughts about job and the past, excessive worry depressed mood, tearfulness, anxiety, isolative behaviors, poor motivation, poor concentration,  irritability, sleep difficulty, thoughts and feelings of hopelessness and worthlessness. He denies any suicidal ideations.  Patient last was seen via virtual visit about 3 weeks ago. He continues to experience  moderate symptoms of depression including fatigue, poor concentration,  and memory difficulty. He also continues to experience headaches he attributes to residual effects of COVID-19.  He is pleased he has been more interactive with his children and reports interacting with them about 3 days/week.  Patient reports he forgot to implement plan of playing UNO with the children.he states it was nice to be more engaged with his children and it was nice to see them laugh and smile.  His wife attends part of the session and also reports he has increased involvement with the children.  She reports she now is working from home and encourages patient to be more involved in activity.  He is doing small tasks at home like putting away the dishes.  Patient reports attending consult with neurologist and expressing his fears about possibly suffering a seizure.  Patient reports deciding against ECT.    SI/HI .Nowithout intent/plan  Therapist Response:  reviewed symptoms, praised and reinforced patient's efforts to implement plan developed in last session, discussed effects on patient/children/and their relationship, assisted patient identify the contributions of his behavior and the connection to his values as a father, developed plan with patient to talk with sons about their day 4 days/week and to play Juanna Cao with his sons 1 time per week, praised and reinforced patient's increased involvement around the house and doing tasks, discussed effects, praised and reinforced patient's use of assertiveness skills and expressing his concerns to neurologist,  encourage patient to talk with Dr. Vanetta Shawl about any other possible options  Plan: Return again in 1-2  weeks  Diagnosis: Axis I: MDD, single episode, severe    Axis II:  No diagnosis    [name], LCSW 07/10/2020

## 2020-07-10 NOTE — Progress Notes (Signed)
Virtual Visit via Video Note  I connected with Gabriel [name] on 07/24/20 at  3:00 PM EDT by a video enabled telemedicine application and verified that I am speaking with the correct person using two identifiers.  Location: Patient: home Provider: office Persons participated in the visit- patient, provider   I discussed the limitations of evaluation and management by telemedicine and the availability of in person appointments. The patient expressed understanding and agreed to proceed.    I discussed the assessment and treatment plan with the patient. The patient was provided an opportunity to ask questions and all were answered. The patient agreed with the plan and demonstrated an understanding of the instructions.   The patient was advised to call back or seek an in-person evaluation if the symptoms worsen or if the condition fails to improve as anticipated.  I provided 20 minutes of non-face-to-face time during this encounter.   Gabriel Hotter, MD    [facility] MD/PA/NP OP Progress Note  07/24/2020 3:31 PM Gabriel [name]  MRN:  [id number]  Chief Complaint:  Chief Complaint    Follow-up; Depression     HPI:  - Although he was seen by Dr. Chari Manning and was considered as good candidate for ECT, he declined this due to fear of seizure.   This is a follow-up appointment for depression and insomnia.  He states that he has been doing better.  His wife changed his condition, and she works from home.  He finds it very helpful.  She helps him to do things and keeps him motivated.  He helped his wife for house chores such as washing dishes.  He has been trying to interact more with his children.  He has been able to make himself do things compared to before.  He feels less drowsy/less cloudiness since Belbuca was decreased.  However, he agrees that he feels a little drowsy from topiramate, which has been helpful for migraine.  He agrees to contact his PCP to see if any options about this.   Although he was seen for ECT evaluation, he feels adamant not to pursue this.  He talks about his uncle, who died from seizure.  He has depressive symptoms as in PHQ-9, although it has been improving.  He sleeps better.  He denies SI.  He feels comfortable to stay on the current medication regimen.   Daily routine:stays in the house Employment:unemployed. 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 He was raised by his mother. He reports good relationship with her, who taught him "love and respect." He has fair relationship with his father   Visit Diagnosis:    ICD-10-CM   1. MDD (major depressive disorder), recurrent episode, moderate (HCC)  F33.1   2. Anxiety  F41.9   3. Insomnia, unspecified type  G47.00     Past Psychiatric History: Please see initial evaluation for full details. I have reviewed the history. No updates at this time.     Past Medical History:  Past Medical History:  Diagnosis Date  . Anxiety   . Carpal tunnel syndrome 2018  . Depression   . Depression    Phreesia 05/15/2020  . Elevated LFTs   . Heart murmur   . Insomnia   . Seasonal allergies   . Vitamin D deficiency     Past Surgical History:  Procedure Laterality Date  .  NO PAST SURGERIES      Family Psychiatric History: Please see initial evaluation for full details. I have reviewed the history. No updates at this time.     Family History:  Family History  Problem Relation Age of Onset  . Diabetes Father   . Hypertension Father   . Hypertension Mother   . Hypertension Maternal Grandfather   . Diabetes Maternal Grandfather   . Cancer Paternal Grandmother        type unknown  . Hypertension Paternal Grandmother   . Cancer Paternal Grandfather        type unknown  . Hypertension Paternal Grandfather     Social History:   Social History   Socioeconomic History  . Marital status: Married    Spouse name: Not on file  . Number of children: 2  . Years of education: 5912  . Highest education level: High school graduate  Occupational History  . Occupation: not employed  Tobacco Use  . Smoking status: Never Smoker  . Smokeless tobacco: Never Used  Vaping Use  . Vaping Use: Never used  Substance and Sexual Activity  . Alcohol use: No    Alcohol/week: 0.0 standard drinks  . Drug use: No  . Sexual activity: Yes    Birth control/protection: None  Other Topics Concern  . Not on file  Social History Narrative   He lives with wife and two children.   Unemployed at this present time   Right handed   One story home   Highest level of education:  12th grade   Social Determinants of Health   Financial Resource Strain: Not on file  Food Insecurity: Not on file  Transportation Needs: Not on file  Physical Activity: Not on file  Stress: Not on file  Social Connections: Not on file    Allergies: No Known Allergies  Metabolic Disorder Labs: Lab Results  Component Value Date   HGBA1C 6.0 (A) 01/31/2020   MPG 137 05/27/2019   MPG 137 12/29/2018   No results found for: PROLACTIN Lab Results  Component Value Date   CHOL 171 07/20/2020   TRIG 54 07/20/2020   HDL 45 07/20/2020   CHOLHDL 3.8 07/20/2020   VLDL 10 12/04/2016   LDLCALC 115 (H) 07/20/2020   LDLCALC 131 (H) 05/27/2019   Lab Results  Component Value Date   TSH 2.450 07/20/2020   TSH 1.93 06/08/2019    Therapeutic Level Labs: No results found for: LITHIUM No results found for: VALPROATE No components found for:  CBMZ  Current Medications: Current Outpatient Medications  Medication Sig Dispense Refill  . [START ON 08/17/2020] Brexpiprazole (REXULTI) 3 MG TABS Take 1 tablet (3 mg total) by mouth at bedtime. 90 tablet 0  . Dextromethorphan-guaiFENesin (MUCINEX DM MAXIMUM STRENGTH) 60-1200 MG TB12 Take 1 tablet by mouth 2 (two) times  daily as needed. 28 tablet 0  . eszopiclone (LUNESTA) 2 MG TABS tablet Take 1 tablet (2 mg total) by mouth at bedtime as needed for sleep. Take immediately before bedtime 30 tablet 0  . ibuprofen (ADVIL) 800 MG tablet Take one tablet by mouth at onset of severe headache with imitrex tablet. May repeat once after 8 hours for persistent headache 30 tablet 0  . loratadine (CLARITIN) 10 MG tablet Take 1 tablet (10 mg total) by mouth daily. 90 tablet 3  . mometasone (NASONEX) 50 MCG/ACT nasal spray Place 2 sprays into the nose daily. 51 g 1  . sodium chloride (OCEAN) 0.65 % nasal spray  Place 1 spray into the nose as needed for congestion. 30 mL 12  . SUMAtriptan (IMITREX) 100 MG tablet Take one tablet by mouth at headache onset, for severe headache. May  Repeat once only in 24 hours, two hours after first tablet. Maximum use is twice weekly 10 tablet 2  . topiramate (TOPAMAX) 100 MG tablet Take 1 tablet (100 mg total) by mouth 2 (two) times daily. 60 tablet 2  . traMADol (ULTRAM) 50 MG tablet Take 50 mg by mouth 2 (two) times daily.    Marland Kitchen tretinoin (RETIN-A) 0.025 % cream Apply 1 application topically at bedtime.    Marland Kitchen venlafaxine XR (EFFEXOR-XR) 150 MG 24 hr capsule 225 mg daily (75 mg +150 mg ) 90 capsule 0  . venlafaxine XR (EFFEXOR-XR) 75 MG 24 hr capsule 225 mg daily (75 mg +150 mg ) 90 capsule 0   No current facility-administered medications for this visit.     Musculoskeletal: Strength & Muscle Tone: N/A Gait & Station: N/A Patient leans: N/A  Psychiatric Specialty Exam: Review of Systems  Psychiatric/Behavioral: Positive for decreased concentration and dysphoric mood. Negative for agitation, behavioral problems, confusion, hallucinations, self-injury, sleep disturbance and suicidal ideas. The patient is nervous/anxious. The patient is not hyperactive.   All other systems reviewed and are negative.   There were no vitals taken for this visit.There is no height or weight on file to  calculate BMI.  General Appearance: Fairly Groomed  Eye Contact:  Good  Speech:  Clear and Coherent  Volume:  Normal  Mood:  better  Affect:  Appropriate, Congruent and less down  Thought Process:  Coherent  Orientation:  Full (Time, Place, and Person)  Thought Content: Logical   Suicidal Thoughts:  No  Homicidal Thoughts:  No  Memory:  Immediate;   Good  Judgement:  Good  Insight:  Fair  Psychomotor Activity:  Normal  Concentration:  Concentration: Good and Attention Span: Good  Recall:  Good  Fund of Knowledge: Good  Language: Good  Akathisia:  No  Handed:  Right  AIMS (if indicated): not done  Assets:  Communication Skills Desire for Improvement  ADL's:  Intact  Cognition: WNL  Sleep:  Fair   Screenings: GAD-7   Flowsheet Row Video Visit from 07/11/2020 in [facility] Office Visit from 12/29/2018 in [facility] Office Visit from 10/28/2018 in [facility] Office Visit from 01/28/2018 in [facility] Phone Follow Up from 01/08/2018 in [facility]  Total GAD-7 Score 15 21 20 15 10     PHQ2-9   Flowsheet Row Video Visit from 07/24/2020 in [facility] Video Visit from 07/11/2020 in [facility] Counselor from [facility]/23/2020 [facility] Video Visit from 05/15/2020 in [facility] Office Visit from 01/31/2020 in [facility]  PHQ-2 Total Score 4 2 3  0 6  PHQ-9 Total Score 7 13 13  -- 22    Flowsheet Row Video Visit from 07/24/2020 in [facility] Counselor from 06/23/2020 in [facility] Phone Follow Up from 01/08/2018 in [facility]  C-SSRS RISK CATEGORY No Risk No Risk No Risk       Assessment and Plan:  Gabriel [name] is a 45 y.o. year old male with a history of depression,bilateral carpel tunnel syndrome , who presents for  follow up appointment for below.    1. MDD (major depressive disorder), recurrent episode, moderate (HCC) 2. Anxiety  Exam is notable for less psychomotor retardation, and there has been overall improvement in his depressive symptoms, which coincided with his wife working from home, and tapering down Belbuca, which was prescribed by his neurologist.  Psychosocial stressors includes loss of his maternal aunt, and unemployment, neuropathy.  Although he did attend to an appointment for ECT evaluation, he does not like to pursue this due to his family member who passed away from seizure.  He is not interested in other treatment options such as ketamine or TMS.  Will continue current medication regimen at this time.  Will continue venlafaxine to target depression.  He is aware of its risk of serotonin syndrome with concomitant use of tramadol.  Will continue rexulti adjunctive treatment for depression.  Discussed potential metabolic side effect and EPS.   # Insomnia Improving.  Will continue current dose of Lunesta to target insomnia.   Plan I have reviewed and updated plans as below 1.Continuevenlafaxine 225mg  (150 mg + 75 mg)daily 2.ContinueRexulti3mg at night - monitor drowsiness 3.ContinueLunesta 2mg  at night as needed for sleep - refill left 4.Next appointment-4/13 at 9 AM for [email or web address] - on tramadol,Belbuca 600 Mcg Film  Noted that according to the pharmacy, venlafaxine was filled on 10/28 for 90days, and rexulti on 1/6 for 30 days. Will continue to monitor for medication adherence.   Past trials of medication:sertraline, fluoxetine,mirtazapine, nortriptyline for carpel tunnel syndrome,bupropion (drowsiness),quetiapine (drowsiness),Abilify ("off balance"),Trazodone, Ambien (limited benefit),lorazepam (worsening in anxiety, drowsiness)  The patient demonstrates the following risk factors for suicide: Chronic risk factors for suicide  include:psychiatric disorder ofdepression. Acute risk factorsfor suicide include: unemployment and loss (financial, interpersonal, professional). Protective factorsfor this patient include: positive social support, responsibility to others (children, family) and hope for the future. Considering these factors, the overall suicide risk at this point appears to below. Patientisappropriate for outpatient follow up.  , MD 07/24/2020, 3:31 PM

## 2020-07-11 ENCOUNTER — Telehealth (INDEPENDENT_AMBULATORY_CARE_PROVIDER_SITE_OTHER): Payer: 59 | Admitting: Family Medicine

## 2020-07-11 ENCOUNTER — Other Ambulatory Visit: Payer: Self-pay

## 2020-07-11 ENCOUNTER — Other Ambulatory Visit: Payer: Self-pay | Admitting: Family Medicine

## 2020-07-11 ENCOUNTER — Encounter: Payer: Self-pay | Admitting: Family Medicine

## 2020-07-11 VITALS — Ht 72.0 in | Wt 205.0 lb

## 2020-07-11 DIAGNOSIS — R7301 Impaired fasting glucose: Secondary | ICD-10-CM

## 2020-07-11 DIAGNOSIS — F321 Major depressive disorder, single episode, moderate: Secondary | ICD-10-CM

## 2020-07-11 DIAGNOSIS — G43011 Migraine without aura, intractable, with status migrainosus: Secondary | ICD-10-CM

## 2020-07-11 DIAGNOSIS — G43909 Migraine, unspecified, not intractable, without status migrainosus: Secondary | ICD-10-CM | POA: Insufficient documentation

## 2020-07-11 DIAGNOSIS — F419 Anxiety disorder, unspecified: Secondary | ICD-10-CM | POA: Diagnosis not present

## 2020-07-11 DIAGNOSIS — E663 Overweight: Secondary | ICD-10-CM | POA: Diagnosis not present

## 2020-07-11 DIAGNOSIS — E559 Vitamin D deficiency, unspecified: Secondary | ICD-10-CM

## 2020-07-11 DIAGNOSIS — E785 Hyperlipidemia, unspecified: Secondary | ICD-10-CM

## 2020-07-11 MED ORDER — IBUPROFEN 800 MG PO TABS: ORAL_TABLET | ORAL | 0 refills | Status: DC

## 2020-07-11 MED ORDER — SUMATRIPTAN SUCCINATE 100 MG PO TABS: ORAL_TABLET | ORAL | 2 refills | Status: DC

## 2020-07-11 MED ORDER — TOPIRAMATE 100 MG PO TABS: 100.0000 mg | ORAL_TABLET | Freq: Two times a day (BID) | ORAL | 2 refills | Status: DC

## 2020-07-11 MED FILL — SUMAtriptan SUCCINATE 100 M: 100 | 30 days supply | Qty: 9 | Fill #0

## 2020-07-11 MED FILL — IBUPROFEN 800 MG TABS: 800 | 15 days supply | Qty: 30 | Fill #0

## 2020-07-11 MED FILL — TOPIRAMATE 100 MG TABLET: 100 | 30 days supply | Qty: 60 | Fill #0

## 2020-07-11 NOTE — Progress Notes (Signed)
Virtual Visit via Telephone Note  I connected with Gabriel [name] on 07/11/20 at  9:20 AM EST by telephone and verified that I am speaking with the correct person using two identifiers.  Location: Patient:home Provider: office   I discussed the limitations, risks, security and privacy concerns of performing an evaluation and management service by telephone and the availability of in person appointments. I also discussed with the patient that there may be a patient responsible charge related to this service. The patient expressed understanding and agreed to proceed.   History of Present Illness: F/U chronic problems and address any new or current concerns. Review and update medications and allergies. Review recent lab and radiologic data . Update routine health maintainace. Review an encourage improved health habits to include nutrition, exercise and  sleep .  C/o new onset daily disabling one sided pounding headaches which are relieved only by sleeping and then they recurrFeels better as far as depression and anxiety are concerned, major reason is because his spouse is now working from home   Observations/Objective: Ht 6' (1.829 m)   Wt 205 lb (93 kg)   BMI 27.80 kg/m  Good communication with no confusion and intact memory. Alert and oriented x 3 No signs of respiratory distress during speech    Assessment and Plan: Migraine New daily disabling pounding left headache no aura, rated at a 10 relieve with sleep, occurs on average 3 days per week  Current moderate episode of major depressive disorder without prior episode (HCC) Some improvement since last visit, continue close f/u and management by Psych, wife's presence in the home , working from home, is extremely benficial  Intractable migraine without aura and with status migrainosus Start daily topamax as preventive therapy and trip tan and NSAID as abortive, and re eval in 8 weeks, history is classic for  migraine  Overweight (BMI 25.0-29.9)  Patient re-educated about  the importance of commitment to a  minimum of 150 minutes of exercise per week as able.  The importance of healthy food choices with portion control discussed, as well as eating regularly and within a 12 hour window most days. The need to choose "clean , green" food 50 to 75% of the time is discussed, as well as to make water the primary drink and set a goal of 64 ounces water daily.    Weight /BMI 07/11/2020 01/31/2020 06/08/2019  WEIGHT 205 lb 205 lb 221 lb 6 oz  HEIGHT 6\' 0"  5' 11.5" 5' 11.5"  BMI 27.8 kg/m2 28.19 kg/m2 30.45 kg/m2  Some encounter information is confidential and restricted. Go to Review Flowsheets activity to see all data.      Anxiety Improving and managed by Psych    Follow Up Instructions:    I discussed the assessment and treatment plan with the patient. The patient was provided an opportunity to ask questions and all were answered. The patient agreed with the plan and demonstrated an understanding of the instructions.   The patient was advised to call back or seek an in-person evaluation if the symptoms worsen or if the condition fails to improve as anticipated.  I provided 17 minutes of non-face-to-face time during this encounter.   09-16-1997, MD

## 2020-07-11 NOTE — Assessment & Plan Note (Signed)
New daily disabling pounding left headache no aura, rated at a 10 relieve with sleep, occurs on average 3 days per week

## 2020-07-11 NOTE — Patient Instructions (Signed)
F/U in 8   To 10 weeks, re evaluate headaches, call if you need me sooner  New medications for headache management are Topamax, imitrex and ibuprofen , please use as directed  Thankful that you are slightly improved.  Please get fasting CBC, lipid, cmp and EGFr, TSH and vit D in the next 1 week  It is important that you exercise regularly at least 30 minutes 5 times a week. If you develop chest pain, have severe difficulty breathing, or feel very tired, stop exercising immediately and seek medical attention  Thanks for choosing [facility], we consider it a privelige to serve you.

## 2020-07-12 ENCOUNTER — Ambulatory Visit: Payer: 59 | Admitting: Family Medicine

## 2020-07-16 ENCOUNTER — Encounter: Payer: Self-pay | Admitting: Family Medicine

## 2020-07-16 DIAGNOSIS — G43011 Migraine without aura, intractable, with status migrainosus: Secondary | ICD-10-CM | POA: Insufficient documentation

## 2020-07-16 NOTE — Assessment & Plan Note (Signed)
Some improvement since last visit, continue close f/u and management by Psych, wife's presence in the home , working from home, is extremely benficial

## 2020-07-16 NOTE — Assessment & Plan Note (Signed)
  Patient re-educated about  the importance of commitment to a  minimum of 150 minutes of exercise per week as able.  The importance of healthy food choices with portion control discussed, as well as eating regularly and within a 12 hour window most days. The need to choose "clean , green" food 50 to 75% of the time is discussed, as well as to make water the primary drink and set a goal of 64 ounces water daily.    Weight /BMI 07/11/2020 01/31/2020 06/08/2019  WEIGHT 205 lb 205 lb 221 lb 6 oz  HEIGHT 6\' 0"  5' 11.5" 5' 11.5"  BMI 27.8 kg/m2 28.19 kg/m2 30.45 kg/m2  Some encounter information is confidential and restricted. Go to Review Flowsheets activity to see all data.

## 2020-07-16 NOTE — Assessment & Plan Note (Signed)
Improving and managed by Psych

## 2020-07-16 NOTE — Assessment & Plan Note (Signed)
Start daily topamax as preventive therapy and trip tan and NSAID as abortive, and re eval in 8 weeks, history is classic for migraine

## 2020-07-17 ENCOUNTER — Other Ambulatory Visit (HOSPITAL_COMMUNITY): Payer: Self-pay | Admitting: Neurology

## 2020-07-17 DIAGNOSIS — Z79899 Other long term (current) drug therapy: Secondary | ICD-10-CM | POA: Diagnosis not present

## 2020-07-17 DIAGNOSIS — Z79891 Long term (current) use of opiate analgesic: Secondary | ICD-10-CM | POA: Diagnosis not present

## 2020-07-17 DIAGNOSIS — G5603 Carpal tunnel syndrome, bilateral upper limbs: Secondary | ICD-10-CM | POA: Diagnosis not present

## 2020-07-17 DIAGNOSIS — M5416 Radiculopathy, lumbar region: Secondary | ICD-10-CM | POA: Diagnosis not present

## 2020-07-17 DIAGNOSIS — E1142 Type 2 diabetes mellitus with diabetic polyneuropathy: Secondary | ICD-10-CM | POA: Diagnosis not present

## 2020-07-17 DIAGNOSIS — M792 Neuralgia and neuritis, unspecified: Secondary | ICD-10-CM | POA: Diagnosis not present

## 2020-07-17 MED FILL — traMADol HCL 50 MG TABS: 50 | 23 days supply | Qty: 180 | Fill #0

## 2020-07-20 DIAGNOSIS — E785 Hyperlipidemia, unspecified: Secondary | ICD-10-CM | POA: Diagnosis not present

## 2020-07-20 DIAGNOSIS — E559 Vitamin D deficiency, unspecified: Secondary | ICD-10-CM | POA: Diagnosis not present

## 2020-07-20 DIAGNOSIS — R7301 Impaired fasting glucose: Secondary | ICD-10-CM | POA: Diagnosis not present

## 2020-07-21 ENCOUNTER — Other Ambulatory Visit: Payer: Self-pay

## 2020-07-21 ENCOUNTER — Ambulatory Visit (INDEPENDENT_AMBULATORY_CARE_PROVIDER_SITE_OTHER): Payer: 59 | Admitting: Psychiatry

## 2020-07-21 DIAGNOSIS — F322 Major depressive disorder, single episode, severe without psychotic features: Secondary | ICD-10-CM | POA: Diagnosis not present

## 2020-07-21 LAB — CMP14+EGFR
ALT: 49 IU/L — ABNORMAL HIGH (ref 0–44)
AST: 31 IU/L (ref 0–40)
Albumin/Globulin Ratio: 1.8 (ref 1.2–2.2)
Albumin: 4.4 g/dL (ref 4.0–5.0)
Alkaline Phosphatase: 110 IU/L (ref 44–121)
BUN/Creatinine Ratio: 12 (ref 9–20)
BUN: 13 mg/dL (ref 6–24)
Bilirubin Total: 0.3 mg/dL (ref 0.0–1.2)
CO2: 23 mmol/L (ref 20–29)
Calcium: 9.2 mg/dL (ref 8.7–10.2)
Chloride: 103 mmol/L (ref 96–106)
Creatinine, Ser: 1.11 mg/dL (ref 0.76–1.27)
Globulin, Total: 2.4 g/dL (ref 1.5–4.5)
Glucose: 104 mg/dL — ABNORMAL HIGH (ref 65–99)
Potassium: 4.2 mmol/L (ref 3.5–5.2)
Sodium: 141 mmol/L (ref 134–144)
Total Protein: 6.8 g/dL (ref 6.0–8.5)
eGFR: 84 mL/min/{1.73_m2} (ref >59)

## 2020-07-21 LAB — LIPID PANEL
Chol/HDL Ratio: 3.8 ratio (ref 0.0–5.0)
Cholesterol, Total: 171 mg/dL (ref 100–199)
HDL: 45 mg/dL (ref >39)
LDL Chol Calc (NIH): 115 mg/dL — ABNORMAL HIGH (ref 0–99)
Triglycerides: 54 mg/dL (ref 0–149)
VLDL Cholesterol Cal: 11 mg/dL (ref 5–40)

## 2020-07-21 LAB — CBC
Hematocrit: 43.2 % (ref 37.5–51.0)
Hemoglobin: 14.6 g/dL (ref 13.0–17.7)
MCH: 29.4 pg (ref 26.6–33.0)
MCHC: 33.8 g/dL (ref 31.5–35.7)
MCV: 87 fL (ref 79–97)
Platelets: 344 10*3/uL (ref 150–450)
RBC: 4.97 x10E6/uL (ref 4.14–5.80)
RDW: 12.2 % (ref 11.6–15.4)
WBC: 4.8 10*3/uL (ref 3.4–10.8)

## 2020-07-21 LAB — VITAMIN D 25 HYDROXY (VIT D DEFICIENCY, FRACTURES): Vit D, 25-Hydroxy: 31.5 ng/mL (ref 30.0–100.0)

## 2020-07-21 LAB — TSH: TSH: 2.45 u[IU]/mL (ref 0.450–4.500)

## 2020-07-21 NOTE — Progress Notes (Signed)
Virtual Visit via Video Note  I connected with Gabriel [name] on 07/21/20 at 10:08 AM EST  by a video enabled telemedicine application and verified that I am speaking with the correct person using two identifiers.  Location: Patient: Home Provider: [facility] [place] office    I discussed the limitations of evaluation and management by telemedicine and the availability of in person appointments. The patient expressed understanding and agreed to proceed.  I provided 34 minutes of non-face-to-face time during this encounter.   Gabriel Salvage, LCSW    THERAPIST PROGRESS NOTE    Session Time:  Friday 07/21/2020 10:08 AM - 10:42 AM   Participation Level: Active  Behavioral Response: AlertDepressed/rocking frequently  Type of Therapy: Individual Therapy  Treatment Goals addressed:  alleviate depressive symptoms and resume normal involvement and activity and social activity, learn and implement cognitive and behavioral strategies to overcome depression,    Interventions: CBT and Supportive  Summary: Gabriel [name] is a 45 y.o. male who is referred for services by PCP Dr. Syliva [name] due to patient experiencing symptoms of depression. He denies any psychiatric hospitalizations, He saw therapist Gabriel [name] in [place] for a few months. Patient reports symptoms began when he lost his job about a year ago. Per his report, he was fired after he complained about race discrimination. 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 He states now feeling as though he failed his family. He states not wanting to be around anyone and having difficulty engaging with his wife and children. He fears losing his family.  He reports ruminating thoughts about job and the past, excessive worry depressed mood, tearfulness, anxiety, isolative behaviors, poor motivation,  poor concentration, irritability, sleep difficulty, thoughts and feelings of hopelessness and worthlessness. He denies any suicidal ideations.  Patient last was seen via virtual visit about 2 weeks ago. He continues to experience  moderate symptoms of depression including fatigue, poor concentration,  and memory difficulty.  He is pleased with his continued efforts to be more interactive with his children and reports interacting with them about 3 - 4 days/week.  Patient reports he forgot to implement plan of playing UN[employer] with the children but plans to do so before next session.  He also is pleased his wife now works from home and says that she checks on him and talks to him during her breaks.  He says this is very helpful.  He reports thoughts of feeling like he is a burden on his wife and fears she will leave him.  He  expresses frustration with himself as he cannot do things around the house like he used to and states not being able to lift things like a gallon of milk.  He reports he did help her put away some grocery items.    SI/HI .Nowithout intent/plan  Therapist Response:  reviewed symptoms, praised and reinforced patient's efforts to implement plan developed in last session, discussed effects on patient/children/and their relationship, assisted patient identify the contributions of his behavior and the connection to his values as a father, developed plan with patient to continue to talk with sons about their day 3-4 days/week and to play Gabriel [name] with his sons 1 time per week, praised and reinforced patient's efforts to do small tasks, discussed effects, facilitated patient sharing thoughts and feelings about being a burden to  his wife, validated feelings, assisted patient examine the evidence for and against his fears, assisted patient identify ways to implement his values as a husband to support his wife, assisted patient identify subjects to discuss and ways to initiate conversation with his wife,  developed plan with patient to initiate conversation with wife,  used ACT (DARE -defusion, acceptance of discomfort, realistic goals, and embracing values ) to cope with painful feelings and thoughts  Plan: Return again in 1-2  weeks  Diagnosis: Axis I: MDD, single episode, severe    Axis II: No diagnosis    Gabriel Salvage, LCSW 07/21/2020

## 2020-07-24 ENCOUNTER — Telehealth (INDEPENDENT_AMBULATORY_CARE_PROVIDER_SITE_OTHER): Payer: 59 | Admitting: Psychiatry

## 2020-07-24 ENCOUNTER — Encounter: Payer: Self-pay | Admitting: Psychiatry

## 2020-07-24 ENCOUNTER — Other Ambulatory Visit: Payer: Self-pay | Admitting: Psychiatry

## 2020-07-24 ENCOUNTER — Other Ambulatory Visit: Payer: Self-pay

## 2020-07-24 DIAGNOSIS — F331 Major depressive disorder, recurrent, moderate: Secondary | ICD-10-CM | POA: Diagnosis not present

## 2020-07-24 DIAGNOSIS — F419 Anxiety disorder, unspecified: Secondary | ICD-10-CM | POA: Diagnosis not present

## 2020-07-24 DIAGNOSIS — G4700 Insomnia, unspecified: Secondary | ICD-10-CM

## 2020-07-24 MED ORDER — REXULTI 3 MG PO TABS: 3.0000 mg | ORAL_TABLET | Freq: Every day | ORAL | 0 refills | Status: DC

## 2020-07-24 NOTE — Patient Instructions (Signed)
1.Continuevenlafaxine 225mg  (150 mg + 75 mg)daily 2.ContinueRexulti3mg at night  3.ContinueLunesta 2mg  at night as needed for sleep 4.Next appointment- 4/13 at 9 AM

## 2020-08-03 ENCOUNTER — Ambulatory Visit (INDEPENDENT_AMBULATORY_CARE_PROVIDER_SITE_OTHER): Payer: 59 | Admitting: Psychiatry

## 2020-08-03 ENCOUNTER — Other Ambulatory Visit: Payer: Self-pay

## 2020-08-03 DIAGNOSIS — F331 Major depressive disorder, recurrent, moderate: Secondary | ICD-10-CM

## 2020-08-03 NOTE — Progress Notes (Signed)
Virtual Visit via Video Note  I connected with Gabriel [name] on 08/03/20 at 9:07 AM EDT  by a video enabled telemedicine application and verified that I am speaking with the correct person using two identifiers.  Location: Patient: Home Provider: [facility] [place] office   I discussed the limitations of evaluation and management by telemedicine and the availability of in person appointments. The patient expressed understanding and agreed to proceed.  I provided 41 minutes of non-face-to-face time during this encounter.   [name], LCSW    THERAPIST PROGRESS NOTE    Session Time:  Thursday 08/03/2020 9:07 AM - 9:48 AM   Participation Level: Active  Behavioral Response: AlertDepressed/rocking frequently  Type of Therapy: Individual Therapy  Treatment Goals addressed:  alleviate depressive symptoms and resume normal involvement and activity and social activity, learn and implement cognitive and behavioral strategies to overcome depression,    Interventions: CBT and Supportive  Summary: Dredyn Gubbels is a 45 y.o. male who is referred for services by PCP Dr. Syliva Overman due to patient experiencing symptoms of depression. He denies any psychiatric hospitalizations, He saw therapist Dorann Lodge in [place] for a few months. Patient reports symptoms began when he lost his job about a year ago. Per his report, he was fired after he complained about race discrimination. 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 He states now feeling as though he failed his family. He states not wanting to be around anyone and having difficulty engaging with his wife and children. He fears losing his family.  He reports ruminating thoughts about job and the past, excessive worry depressed mood, tearfulness, anxiety, isolative behaviors, poor motivation, poor  concentration, irritability, sleep difficulty, thoughts and feelings of hopelessness and worthlessness. He denies any suicidal ideations.  Patient last was seen via virtual visit about 2 weeks ago. He continues to experience  moderate symptoms of depression including fatigue, poor concentration,  and memory difficulty.  He reports continued efforts to be more interactive with his children and reports interacting with them about 3 - 4 days/week.   Patient reports his wife remains very supportive and has helped him improve medication compliance.  He reports previously forgetting to take medication at designated times but reports  now taking medication on time since wife is at home to remind him.  He also reports going outside and sitting on the porch with his wife for about 20 minutes 3 times per week.  She also continues to initiate conversation with him and along with patient played Juanna Cao with their children.  Patient reports initiating conversation with his wife 3-4 times since last session.  He reports being glad that she seemed to be happy with this.  He expresses less worry and fear about wife leaving him.  He states that he wants to continue to increase interaction/engagement with his family but expresses frustration that he feels drained and unmotivated.  He states feeling as though he is just going through the motions and not really experiencing joy and zest for life.     SI/HI .Nowithout intent/plan  Therapist Response:  reviewed symptoms, praised and reinforced patient's efforts to implement plan developed in last session, discussed effects on patient/children/and their relationship, facilitated patient expressing thoughts and feelings about coping with depression, validated feelings,used ACT (DARE -defusion, acceptance of discomfort, realistic goals, and  embracing values ) to cope with painful feelings and thoughts,  developed plan with patient to interact with children daily and to initiate a  conversation with his wife daily,   Plan: Return again in 1-2  weeks  Diagnosis: Axis I: MDD, single episode, severe    Axis II: No diagnosis    [name], LCSW 08/03/2020

## 2020-08-12 ENCOUNTER — Other Ambulatory Visit (HOSPITAL_COMMUNITY): Payer: Self-pay

## 2020-08-16 ENCOUNTER — Other Ambulatory Visit (HOSPITAL_COMMUNITY): Payer: Self-pay

## 2020-08-16 ENCOUNTER — Other Ambulatory Visit: Payer: Self-pay

## 2020-08-16 MED FILL — Ketoconazole Shampoo 2%: CUTANEOUS | 30 days supply | Qty: 120 | Fill #0 | Status: AC

## 2020-08-16 MED FILL — Sumatriptan Succinate Tab 100 MG: ORAL | 30 days supply | Qty: 9 | Fill #0 | Status: AC

## 2020-08-16 MED FILL — Topiramate Tab 100 MG: ORAL | 30 days supply | Qty: 60 | Fill #0 | Status: AC

## 2020-08-17 ENCOUNTER — Other Ambulatory Visit: Payer: Self-pay

## 2020-08-17 ENCOUNTER — Other Ambulatory Visit (HOSPITAL_COMMUNITY): Payer: Self-pay

## 2020-08-17 ENCOUNTER — Ambulatory Visit (INDEPENDENT_AMBULATORY_CARE_PROVIDER_SITE_OTHER): Payer: 59 | Admitting: Psychiatry

## 2020-08-17 DIAGNOSIS — F331 Major depressive disorder, recurrent, moderate: Secondary | ICD-10-CM | POA: Diagnosis not present

## 2020-08-17 NOTE — Progress Notes (Signed)
Virtual Visit via Video Note  I connected with Gabriel [name] on 08/17/20 at 9:20 AM EDT  by a video enabled telemedicine application and verified that I am speaking with the correct person using two identifiers.  Location: Patient: Home Provider: [facility]  office    I discussed the limitations of evaluation and management by telemedicine and the availability of in person appointments. The patient expressed understanding and agreed to proceed.   I provided 33 minutes of non-face-to-face time during this encounter.   [name], LCSW    THERAPIST PROGRESS NOTE    Session Time:  Thursday 08/17/2020 9:20 AM -  9:53 AM   Participation Level: Active  Behavioral Response: AlertDepressed/rocking frequently  Type of Therapy: Individual Therapy  Treatment Goals addressed:  alleviate depressive symptoms and resume normal involvement and activity and social activity, learn and implement cognitive and behavioral strategies to overcome depression,    Interventions: CBT and Supportive  Summary: Gabriel [name] is a 45 y.o. male who is referred for services by PCP Dr. Syliva Overman due to patient experiencing symptoms of depression. He denies any psychiatric hospitalizations, He saw therapist Dorann Lodge in [place] for a few months. Patient reports symptoms began when he lost his job about a year ago. Per his report, he was fired after he complained about race discrimination. 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 He states now feeling as though he failed his family. He states not wanting to be around anyone and having difficulty engaging with his wife and children. He fears losing his family.  He reports ruminating thoughts about job and the past, excessive worry depressed mood, tearfulness, anxiety, isolative behaviors, poor motivation,  poor concentration, irritability, sleep difficulty, thoughts and feelings of hopelessness and worthlessness. He denies any suicidal ideations.  Patient last was seen via virtual visit about 2 weeks ago. He continues to experience  moderate symptoms of depression including fatigue, poor concentration,  and memory difficulty.  He reports continued efforts to be interractive with his children and reports interacting with them about 3 - 4 days/week.   He also reports going outside to watch son play basketball for a few minutes and reports enjoying seeing them happy.  Patient reports his wife remains very supportive.  He has continued to make efforts to initiate conversation with his wife.  He reports her response was positive and states she thanked him.  Patient reports this was uplifting.  He continues to report feeling as though he is not experiencing joy and zest for life.    SI/HI .Nowithout intent/plan  Therapist Response:  reviewed symptoms, administered PHQ 2 and 9 with C-SS RS,  praised and reinforced patient's efforts to implement plan developed in last session, discussed effects on patient and his relationship with his family, assisted patient identify ways to initiate more interaction with wife consistent with his values, developed plan with patient to prepare snacks and take to his wife during her breaks, reviewed (DARE -defusion, acceptance of discomfort, realistic goals, and embracing values ) to cope with painful feelings and thoughts,  Plan: Return again in 1-2  weeks  Diagnosis: Axis I: MDD, single episode, severe    Axis II: No diagnosis    [name], LCSW 08/17/2020

## 2020-08-17 NOTE — Progress Notes (Signed)
Virtual Visit via Video Note  I connected with Gabriel [name] on 08/23/20 at  9:00 AM EDT by a video enabled telemedicine application and verified that I am speaking with the correct person using two identifiers.  Location: Patient: home Provider: office Persons participated in the visit- patient, provider   I discussed the limitations of evaluation and management by telemedicine and the availability of in person appointments. The patient expressed understanding and agreed to proceed.    I discussed the assessment and treatment plan with the patient. The patient was provided an opportunity to ask questions and all were answered. The patient agreed with the plan and demonstrated an understanding of the instructions.   The patient was advised to call back or seek an in-person evaluation if the symptoms worsen or if the condition fails to improve as anticipated.  I provided 20 minutes of non-face-to-face time during this encounter.   [name], MD    [facility] MD/PA/NP OP Progress Note  08/23/2020 9:38 AM Gabriel [name]  MRN:  [id number]  Chief Complaint:  Chief Complaint    Follow-up; Depression; Anxiety     HPI:  This is a follow-up appointment for depression and insomnia.  He states that he has been doing good.  He has been taking a walk with his wife.  He states that his wife has been very helpful, who now works from home. She encourages him to do more things.  He enjoys interacting with his children.  Although there are still times he stays by himself in the room, he is hoping to have more interaction with his family.  He has not contacted his PCP about drowsiness from topiramate.  He struggles with middle insomnia.  He has other depressive symptoms as in PHQ-9.  He denies SI.  He feels anxious and tense.  He denies panic attacks.    Daily routine:takes a walk with his wife a few times per week Employment:unemployed. 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 He was raised by his mother. He reports good relationship with her, who taught him "love and respect." He has fair relationship with his father   Visit Diagnosis:    ICD-10-CM   1. Insomnia, unspecified type  G47.00   2. Current moderate episode of major depressive disorder without prior episode (HCC)  F32.1 venlafaxine XR (EFFEXOR-XR) 150 MG 24 hr capsule    venlafaxine XR (EFFEXOR-XR) 75 MG 24 hr capsule  3. Anxiety  F41.9 venlafaxine XR (EFFEXOR-XR) 150 MG 24 hr capsule    venlafaxine XR (EFFEXOR-XR) 75 MG 24 hr capsule    Past Psychiatric History: Please see initial evaluation for full details. I have reviewed the history. No updates at this time.     Past Medical History:  Past Medical History:  Diagnosis Date  . Anxiety   . Carpal tunnel syndrome 2018  . Depression   . Depression    Phreesia 05/15/2020  . Elevated LFTs   . Heart murmur   . Insomnia   . Seasonal allergies   . Vitamin D deficiency     Past Surgical History:  Procedure Laterality Date  . NO PAST SURGERIES      Family Psychiatric History: Please see initial evaluation for full details. I have reviewed the history. No updates at this time.     Family History:  Family History  Problem Relation Age of Onset  . Diabetes Father   . Hypertension Father   . Hypertension Mother   . Hypertension Maternal Grandfather   . Diabetes Maternal Grandfather   . Cancer Paternal Grandmother        type unknown  . Hypertension Paternal Grandmother   . Cancer Paternal Grandfather        type unknown  . Hypertension Paternal Grandfather     Social History:  Social History   Socioeconomic History  . Marital status: Married    Spouse name: Not on file  . Number of children: 2  . Years of education: 5612  . Highest education level: High school graduate   Occupational History  . Occupation: not employed  Tobacco Use  . Smoking status: Never Smoker  . Smokeless tobacco: Never Used  Vaping Use  . Vaping Use: Never used  Substance and Sexual Activity  . Alcohol use: No    Alcohol/week: 0.0 standard drinks  . Drug use: No  . Sexual activity: Yes    Birth control/protection: None  Other Topics Concern  . Not on file  Social History Narrative   He lives with wife and two children.   Unemployed at this present time   Right handed   One story home   Highest level of education:  12th grade   Social Determinants of Health   Financial Resource Strain: Not on file  Food Insecurity: Not on file  Transportation Needs: Not on file  Physical Activity: Not on file  Stress: Not on file  Social Connections: Not on file    Allergies: No Known Allergies  Metabolic Disorder Labs: Lab Results  Component Value Date   HGBA1C 6.0 (A) 01/31/2020   MPG 137 05/27/2019   MPG 137 12/29/2018   No results found for: PROLACTIN Lab Results  Component Value Date   CHOL 171 07/20/2020   TRIG 54 07/20/2020   HDL 45 07/20/2020   CHOLHDL 3.8 07/20/2020   VLDL 10 12/04/2016   LDLCALC 115 (H) 07/20/2020   LDLCALC 131 (H) 05/27/2019   Lab Results  Component Value Date   TSH 2.450 07/20/2020   TSH 1.93 06/08/2019    Therapeutic Level Labs: No results found for: LITHIUM No results found for: VALPROATE No components found for:  CBMZ  Current Medications: Current Outpatient Medications  Medication Sig Dispense Refill  . Brexpiprazole 3 MG TABS TAKE 1 TABLET (3 MG TOTAL) BY MOUTH AT BEDTIME. FILL 4/2 90 tablet 0  . Buprenorphine HCl 600 MCG FILM PLACE 1 FILM IN THE CHEEK TWICE A DAY 60 each 2  . Buprenorphine HCl 600 MCG FILM PLACE 1 FILM IN THE CHEEK TWICE A DAY 60 each 0  . Buprenorphine HCl 600 MCG FILM PLACE 1 FILM TWICE A DAY INSIDE YOUR CHEEK 60 each 0  . Buprenorphine HCl 750 MCG FILM PLACE 1 FILM TWICE A DAY INSIDE THE CHEEK. 60 each 1   . Dextromethorphan-guaiFENesin (MUCINEX DM MAXIMUM STRENGTH) 60-1200 MG TB12 TAKE 1 TABLET BY MOUTH 2 (TWO) TIMES DAILY AS NEEDED. 28 tablet 0  . eszopiclone (LUNESTA) 2 MG TABS tablet TAKE 1 TABLET (2 MG TOTAL) BY MOUTH AT BEDTIME AS NEEDED FOR SLEEP. TAKE IMMEDIATELY BEFORE BEDTIME FILL AFTER 07/13/20 30 tablet 0  . fluconazole (DIFLUCAN) 150 MG tablet TAKE ONE TABLET BY MOUTH DAILY X 5 DAYS. 5 tablet 0  . gentamicin ointment (GARAMYCIN) 0.1 % APPLY TO AFFECTED TOENAILS DAILY FOR 3 WEEKS, THEN EVERY OTHER DAY FOR  3 WEEKS. 15 g 1  . hydrocortisone 2.5 % cream APPLY TO FACE 2 TIMES DAILY FOR 3 WEEKS, THEN DAILY FOR 3 WEEKS, THEN EVERY OTHER DAY FOR 3 WEEKS THEN STOP. REPEAT AS NEEDED. 30 g 3  . ibuprofen (ADVIL) 800 MG tablet TAKE ONE TABLET BY MOUTH AT ONSET OF SEVERE HEADACHE WITH IMITREX TABLET. MAY REPEAT ONCE AFTER 8 HOURS FOR PERSISTENT HEADACHE 30 tablet 0  . ketoconazole (NIZORAL) 2 % shampoo APPLY TO FACE TWICE WEEKLY. APPLY TO BACK DAILY FOR 1 WEEK, THEN TWICE WEEKLY. 120 mL 11  . loratadine (CLARITIN) 10 MG tablet Take 1 tablet (10 mg total) by mouth daily. 90 tablet 3  . mometasone (NASONEX) 50 MCG/ACT nasal spray Place 2 sprays into the nose daily. 51 g 1  . pimecrolimus (ELIDEL) 1 % cream APPLY TO FACE TWICE DAILY FOR 3 WEEKS,THEN DAILY FOR 3 WEEKS,THEN DAILY AS NEEDED 30 g 5  . sodium chloride (OCEAN) 0.65 % nasal spray PLACE 1 SPRAY INTO THE NOSE AS NEEDED FOR CONGESTION. 30 mL 12  . SUMAtriptan (IMITREX) 100 MG tablet TAKE 1 TABLET BY MOUTH AT HEADACHE ONSET, FOR SEVERE HEADACHE. MAY REPEAT ONCE ONLY IN 24 HOURS, 2 HOURS AFTER 1ST TABLET. MAX USE=TWICE WEEKLY 10 tablet 2  . topiramate (TOPAMAX) 100 MG tablet TAKE 1 TABLET (100 MG TOTAL) BY MOUTH 2 (TWO) TIMES DAILY. 60 tablet 2  . traMADol (ULTRAM) 50 MG tablet Take 50 mg by mouth 2 (two) times daily.    . traMADol (ULTRAM) 50 MG tablet TAKE 2 TABLETS BY MOUTH EVERY 6 HOURS AS NEEDED. 180 tablet 2  . traMADol (ULTRAM) 50 MG tablet  TAKE 2 TABLETS BY MOUTH EVERY 6 HOURS AS NEEDED 180 tablet 1  . tretinoin (RETIN-A) 0.025 % cream Apply 1 application topically at bedtime.    [name] ON 09/16/2020] venlafaxine XR (EFFEXOR-XR) 150 MG 24 hr capsule 225 mg daily. Take along with 75 mg cap 90 capsule 1  . [START ON 09/16/2020] venlafaxine XR (EFFEXOR-XR) 75 MG 24 hr capsule TAKE 1 CAPSULE BY MOUTH ALONG WITH 1 OF THE 150MG  CAPSULES TO TOTAL 225MG  DAILY 90 capsule 1   No current facility-administered medications for this visit.     Musculoskeletal: Strength & Muscle Tone: N/A Gait & Station: N/A Patient leans: N/A  Psychiatric Specialty Exam: Review of Systems  Psychiatric/Behavioral: Positive for decreased concentration, dysphoric mood and sleep disturbance. Negative for agitation, behavioral problems, confusion, hallucinations, self-injury and suicidal ideas. The patient is nervous/anxious. The patient is not hyperactive.   All other systems reviewed and are negative.   There were no vitals taken for this visit.There is no height or weight on file to calculate BMI.  General Appearance: Fairly Groomed  Eye Contact:  Good  Speech:  Clear and Coherent  Volume:  Normal  Mood:  good  Affect:  Appropriate, Congruent and less down  Thought Process:  Coherent  Orientation:  Full (Time, Place, and Person)  Thought Content: Logical   Suicidal Thoughts:  No  Homicidal Thoughts:  No  Memory:  Immediate;   Good  Judgement:  Good  Insight:  Fair  Psychomotor Activity:  Normal  Concentration:  Concentration: Good and Attention Span: Good  Recall:  Good  Fund of Knowledge: Good  Language: Good  Akathisia:  No  Handed:  Right  AIMS (if indicated): not done  Assets:  Communication Skills Desire for Improvement  ADL's:  Intact  Cognition: WNL  Sleep:  Poor   Screenings:  GAD-7   Flowsheet Row Video Visit from 07/11/2020 in [facility] Office Visit from 12/29/2018 in [facility] Office Visit from  10/28/2018 in [facility] Office Visit from 01/28/2018 in [facility] Phone Follow Up from 01/08/2018 in [facility]  Total GAD-7 Score 15 21 20 15 10     PHQ2-9   Flowsheet Row Video Visit from 08/23/2020 in [facility] Counselor from [facility]2020 [facility] Video Visit from 07/24/2020 in [facility] Video Visit from 07/11/2020 in [facility] Counselor from 06/23/2020 in [facility]  PHQ-2 Total Score 3 3 4 2 3   PHQ-9 Total Score 13 15 7 13 13     Flowsheet Row Video Visit from 08/23/2020 in [facility] Counselor from 08/17/2020 [facility] Video Visit from 07/24/2020 in [facility]  C-SSRS RISK CATEGORY No Risk No Risk No Risk       Assessment and Plan:  Gabriel [name] is a 45 y.o. year old male with a history of depression,bilateral carpel tunnel syndrome, who presents for follow up appointment for below.   1. Current moderate episode of major depressive disorder without prior episode (HCC) 2. Anxiety There has been a slow but steady improvement in depressive symptoms since the last visit. Psychosocial stressors includes loss of his maternal aunt,and unemployment,neuropathy.   Will continue current medication regimen.  Will continue venlafaxine to target depression and anxiety.  Discussed potential risk of sertraline syndrome with concomitant use of tramadol. Will continue rexulti adjunctive treatment for depression.   3. Insomnia, unspecified type Slightly worsened.  Coached sleep hygiene.  Will continue the same dose of Lunesta at this time given he reports drowsiness in the morning.  He is advised to contact his PCP to see if any alternative treatment for his migraine given he reports   drowsiness from topiramate.   This clinician has discussed the side effect associated with medication prescribed during this encounter. Please refer to notes in the previous encounters for more details.   Plan I have reviewed and updated plans as below 1.Continuevenlafaxine 225mg  (150 mg + 75 mg)daily 2.ContinueRexulti3mg at night - monitor drowsiness 3.ContinueLunesta 2mg  at night as needed for sleep - refill left 4.Next appointment- 5/11 at 9 AMfor Gabriel [name]. He declined in person visit at this time as he would not be able to drive long distance - on tramadol,[place] 600 Mcg Film - on topiramate for migraine  Noted that according to the pharmacy, venlafaxine was filled on 10/28 for 90days, and rexulti on 1/6 for 30 days. Will continue to monitor for medication adherence.  Past trials of medication:sertraline, fluoxetine,mirtazapine, nortriptyline for carpel tunnel syndrome,bupropion (drowsiness),quetiapine (drowsiness),Abilify ("off balance"),Trazodone, Ambien (limited benefit),lorazepam (worsening in anxiety, drowsiness)  The patient demonstrates the following risk factors for suicide: Chronic risk factors for suicide include:psychiatric disorder ofdepression. Acute risk factorsfor suicide include: unemployment and loss (financial, interpersonal, professional). Protective factorsfor this patient include: positive social support, responsibility to others (children, family) and hope for the future. Considering these factors, the overall suicide risk at this point appears to below. Patientisappropriate for outpatient follow up.  , MD 08/23/2020, 9:38 AM

## 2020-08-23 ENCOUNTER — Other Ambulatory Visit (HOSPITAL_COMMUNITY): Payer: Self-pay

## 2020-08-23 ENCOUNTER — Encounter: Payer: Self-pay | Admitting: Psychiatry

## 2020-08-23 ENCOUNTER — Telehealth (INDEPENDENT_AMBULATORY_CARE_PROVIDER_SITE_OTHER): Payer: 59 | Admitting: Psychiatry

## 2020-08-23 ENCOUNTER — Other Ambulatory Visit: Payer: Self-pay

## 2020-08-23 DIAGNOSIS — G4700 Insomnia, unspecified: Secondary | ICD-10-CM

## 2020-08-23 DIAGNOSIS — F419 Anxiety disorder, unspecified: Secondary | ICD-10-CM

## 2020-08-23 DIAGNOSIS — F321 Major depressive disorder, single episode, moderate: Secondary | ICD-10-CM | POA: Diagnosis not present

## 2020-08-23 MED ORDER — VENLAFAXINE HCL ER 75 MG PO CP24
75.0000 mg | ORAL_CAPSULE | Freq: Every day | ORAL | 1 refills | Status: DC
  Filled 2020-08-23 – 2020-09-20 (×2): qty 90, 90d supply, fill #0
  Filled 2020-12-22: qty 90, 90d supply, fill #1

## 2020-08-23 MED ORDER — VENLAFAXINE HCL ER 150 MG PO CP24
150.0000 mg | ORAL_CAPSULE | ORAL | 1 refills | Status: DC
  Filled 2020-08-23 – 2020-09-20 (×2): qty 90, 90d supply, fill #0
  Filled 2020-12-22: qty 90, 90d supply, fill #1

## 2020-08-23 NOTE — Patient Instructions (Signed)
1.Continuevenlafaxine 225mg  (150 mg + 75 mg)daily 2.ContinueRexulti3mg at night  3.ContinueLunesta 2mg  at night as needed for sleep  4.Next appointment- 5/11 at 9 AM 5. Please discuss with your PCP regarding drowsiness from topiramate

## 2020-08-24 ENCOUNTER — Encounter (HOSPITAL_COMMUNITY): Payer: Self-pay

## 2020-08-25 ENCOUNTER — Other Ambulatory Visit: Payer: Self-pay

## 2020-08-25 ENCOUNTER — Other Ambulatory Visit (HOSPITAL_COMMUNITY): Payer: Self-pay

## 2020-08-31 ENCOUNTER — Other Ambulatory Visit: Payer: Self-pay

## 2020-08-31 ENCOUNTER — Ambulatory Visit (INDEPENDENT_AMBULATORY_CARE_PROVIDER_SITE_OTHER): Payer: 59 | Admitting: Psychiatry

## 2020-08-31 DIAGNOSIS — F321 Major depressive disorder, single episode, moderate: Secondary | ICD-10-CM | POA: Diagnosis not present

## 2020-08-31 DIAGNOSIS — F419 Anxiety disorder, unspecified: Secondary | ICD-10-CM | POA: Diagnosis not present

## 2020-08-31 NOTE — Progress Notes (Signed)
Virtual Visit via Video Note  I connected with Gabriel [name] on 08/31/20 at  9:00 AM EDT by a video enabled telemedicine application and verified that I am speaking with the correct person using two identifiers.  Location: Patient: Home Provider: [facility] [place] office    I discussed the limitations of evaluation and management by telemedicine and the availability of in person appointments. The patient expressed understanding and agreed to proceed.  I provided 35 minutes of non-face-to-face time during this encounter.   Gabriel Salvage, LCSW     THERAPIST PROGRESS NOTE    Session Time:  Thursday 08/31/2020 9:15 AM - 9:50 AM   Participation Level: Active  Behavioral Response: AlertDepressed/rocking frequently  Type of Therapy: Individual Therapy  Treatment Goals addressed:  alleviate depressive symptoms and resume normal involvement and activity and social activity, learn and implement cognitive and behavioral strategies to overcome depression,    Interventions: CBT and Supportive  Summary: Gabriel [name] is a 45 y.o. male who is referred for services by PCP Dr. Syliva [name] due to patient experiencing symptoms of depression. He denies any psychiatric hospitalizations, He saw therapist Gabriel [name] in [place] for a few months. Patient reports symptoms began when he lost his job about a year ago. Per his report, he was fired after he complained about race discrimination. 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 He states now feeling as though he failed his family. He states not wanting to be around anyone and having difficulty engaging with his wife and children. He fears losing his family.  He reports ruminating thoughts about job and the past, excessive worry depressed mood, tearfulness, anxiety, isolative behaviors, poor motivation,  poor concentration, irritability, sleep difficulty, thoughts and feelings of hopelessness and worthlessness. He denies any suicidal ideations.  Patient last was seen via virtual visit about 2 weeks ago. He continues to experience  moderate symptoms of depression including fatigue, poor concentration, and memory difficulty.  He reports implementing plan of preparing and taking snacks to his wife twice since last session.  He is pleased with his efforts and reports wife was thankful and positive about his efforts.  He reports continued efforts to be interractive with his children and reports interacting with them consistently last week. He reports less involvement with them this week due to increased neuropathic pain in his feet.  Per patient's report, the pain triggers thoughts of he may not be in this place if he had not worked the way he did on his job as well as other what if thoughts.  He reports ruminating on these thoughts and isolating self.     SI/HI .Nowithout intent/plan  Therapist Response:  reviewed symptoms, praised and reinforced patient's implementation of plan developed in last session, discussed effects on patient and interaction with his wife, developed plan with patient to take snacks to wife 2 times per week, assisted patient began to examine his thoughts about his physical pain and the effects on his mood and behavior, used cognitive defusion to assist patient cope with ruminating thoughts, discussed recommendations patient's daughter has given to patient regarding coping with pain including increasing movement, assisted patient identify ways to increase involvement in activity consistent with his values of being in a supportive husband as well as improving his health, developed plan with patient to open the blinds in  his home daily, discussed pacing self, assisted patient identify and address thoughts and processes that may inhibit implementation of plan,  reviewed (DARE -defusion,  acceptance of discomfort, realistic goals, and embracing values ) to cope with painful feelings and thoughts,  Plan: Return again in 1-2  weeks  Diagnosis: Axis I: MDD, single episode, severe    Axis II: No diagnosis    Gabriel Salvage, LCSW 08/31/2020

## 2020-09-01 ENCOUNTER — Other Ambulatory Visit: Payer: Self-pay

## 2020-09-01 ENCOUNTER — Other Ambulatory Visit (HOSPITAL_COMMUNITY): Payer: Self-pay

## 2020-09-05 ENCOUNTER — Other Ambulatory Visit (HOSPITAL_COMMUNITY): Payer: Self-pay

## 2020-09-08 ENCOUNTER — Other Ambulatory Visit (HOSPITAL_COMMUNITY): Payer: Self-pay

## 2020-09-11 ENCOUNTER — Other Ambulatory Visit (HOSPITAL_COMMUNITY): Payer: Self-pay

## 2020-09-11 MED ORDER — GENTAMICIN SULFATE 0.1 % EX OINT
1.0000 "application " | TOPICAL_OINTMENT | CUTANEOUS | 1 refills | Status: DC
  Filled 2020-09-11: qty 15, 28d supply, fill #0
  Filled 2020-12-22: qty 15, 28d supply, fill #1

## 2020-09-12 ENCOUNTER — Other Ambulatory Visit (HOSPITAL_COMMUNITY): Payer: Self-pay

## 2020-09-12 NOTE — Progress Notes (Signed)
Virtual Visit via Video Note  I connected with Gabriel [name] on 09/20/20 at  9:00 AM EDT by a video enabled telemedicine application and verified that I am speaking with the correct person using two identifiers.  Location: Patient: home Provider: office Persons participated in the visit- patient, provider   I discussed the limitations of evaluation and management by telemedicine and the availability of in person appointments. The patient expressed understanding and agreed to proceed.   I discussed the assessment and treatment plan with the patient. The patient was provided an opportunity to ask questions and all were answered. The patient agreed with the plan and demonstrated an understanding of the instructions.   The patient was advised to call back or seek an in-person evaluation if the symptoms worsen or if the condition fails to improve as anticipated.  I provided 17 minutes of non-face-to-face time during this encounter.   [name], MD    [facility] MD/PA/NP OP Progress Note  09/20/2020 9:34 AM Gabriel [name]  MRN:  [id number]  Chief Complaint:  Chief Complaint    Follow-up; Depression; Other     HPI:  This is a follow-up appointment for depression and insomnia.  He states that he has been doing the same.  However on further elaboration, he has been getting out from the room more frequently.  He feels very appreciative of his wife, who has been very helpful.  She gives him more confidence.  He does not lie or sit in the room as much compared to before.  He has been eating meals with his family.  He enjoys more interaction with his children.  He is hoping to work on cleaning the table at least a few times per week.  He has middle insomnia, which she attributes to migraine.  Although he still feels drowsy during the day, topiramate has been very helpful for migraine.  When he is asked about his rocking back and forth during the visit, he states that he has been doing this to  find a comfortable position due to his pain.  He does not think it is related to medication. He feels less depressed.  He has poor appetite.  She has difficulty in concentration.  He denies SI.    Wt Readings from Last 3 Encounters:  09/18/20 210 lb 12.8 oz (95.6 kg)  07/11/20 205 lb (93 kg)  01/31/20 205 lb (93 kg)    Daily routine:takes a walk with his wife a few times per week Employment:unemployed. 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 He was raised by his mother. He reports good relationship with her, who taught him "love and respect." He has fair relationship with his father  Visit Diagnosis:    ICD-10-CM   1. Current moderate episode of major depressive disorder without prior episode (HCC)  F32.1   2. Insomnia, unspecified type  G47.00     Past Psychiatric History: Please see initial evaluation for full details. I have reviewed the history. No updates at this time.     Past Medical History:  Past Medical History:  Diagnosis Date  . Anxiety   . Carpal tunnel syndrome 2018  . Depression   . Depression    Phreesia 05/15/2020  . Elevated LFTs   . Heart murmur   . Insomnia   . Seasonal allergies   .  Vitamin D deficiency     Past Surgical History:  Procedure Laterality Date  . NO PAST SURGERIES      Family Psychiatric History: Please see initial evaluation for full details. I have reviewed the history. No updates at this time.     Family History:  Family History  Problem Relation Age of Onset  . Diabetes Father   . Hypertension Father   . Hypertension Mother   . Hypertension Maternal Grandfather   . Diabetes Maternal Grandfather   . Cancer Paternal Grandmother        type unknown  . Hypertension Paternal Grandmother   . Cancer Paternal Grandfather        type unknown  .  Hypertension Paternal Grandfather     Social History:  Social History   Socioeconomic History  . Marital status: Married    Spouse name: Not on file  . Number of children: 2  . Years of education: 56  . Highest education level: High school graduate  Occupational History  . Occupation: not employed  Tobacco Use  . Smoking status: Never Smoker  . Smokeless tobacco: Never Used  Vaping Use  . Vaping Use: Never used  Substance and Sexual Activity  . Alcohol use: No    Alcohol/week: 0.0 standard drinks  . Drug use: No  . Sexual activity: Yes    Birth control/protection: None  Other Topics Concern  . Not on file  Social History Narrative   He lives with wife and two children.   Unemployed at this present time   Right handed   One story home   Highest level of education:  12th grade   Social Determinants of Health   Financial Resource Strain: Not on file  Food Insecurity: Not on file  Transportation Needs: Not on file  Physical Activity: Not on file  Stress: Not on file  Social Connections: Not on file    Allergies: No Known Allergies  Metabolic Disorder Labs: Lab Results  Component Value Date   HGBA1C 6.0 (A) 01/31/2020   MPG 137 05/27/2019   MPG 137 12/29/2018   No results found for: PROLACTIN Lab Results  Component Value Date   CHOL 171 07/20/2020   TRIG 54 07/20/2020   HDL 45 07/20/2020   CHOLHDL 3.8 07/20/2020   VLDL 10 12/04/2016   LDLCALC 115 (H) 07/20/2020   LDLCALC 131 (H) 05/27/2019   Lab Results  Component Value Date   TSH 2.450 07/20/2020   TSH 1.93 06/08/2019    Therapeutic Level Labs: No results found for: LITHIUM No results found for: VALPROATE No components found for:  CBMZ  Current Medications: Current Outpatient Medications  Medication Sig Dispense Refill  . Brexpiprazole 3 MG TABS TAKE 1 TABLET (3 MG TOTAL) BY MOUTH AT BEDTIME. FILL 4/2 90 tablet 0  . Buprenorphine HCl 600 MCG FILM PLACE 1 FILM IN THE CHEEK TWICE A DAY 60 each  2  . Buprenorphine HCl 600 MCG FILM PLACE 1 FILM IN THE CHEEK TWICE A DAY 60 each 0  . Buprenorphine HCl 750 MCG FILM PLACE 1 FILM TWICE A DAY INSIDE THE CHEEK. 60 each 1  . eszopiclone (LUNESTA) 2 MG TABS tablet Take 1 tablet (2 mg total) by mouth at bedtime as needed for sleep. 30 tablet 1  . gentamicin ointment (GARAMYCIN) 0.1 % APPLY TO AFFECTED TOENAILS DAILY FOR 3 WEEKS, THEN EVERY OTHER DAY FOR 3 WEEKS. 15 g 1  . gentamicin ointment (GARAMYCIN) 0.1 % Apply to affected  toenails daily for 3 weeks, then every other day for 3 weeks. 15 g 1  . hydrocortisone 2.5 % cream APPLY TO FACE 2 TIMES DAILY FOR 3 WEEKS, THEN DAILY FOR 3 WEEKS, THEN EVERY OTHER DAY FOR 3 WEEKS THEN STOP. REPEAT AS NEEDED. 30 g 3  . ibuprofen (ADVIL) 800 MG tablet TAKE ONE TABLET BY MOUTH AT ONSET OF SEVERE HEADACHE WITH IMITREX TABLET. MAY REPEAT ONCE AFTER 8 HOURS FOR PERSISTENT HEADACHE 30 tablet 0  . ketoconazole (NIZORAL) 2 % shampoo APPLY TO FACE TWICE WEEKLY. APPLY TO BACK DAILY FOR 1 WEEK, THEN TWICE WEEKLY. 120 mL 11  . loratadine (CLARITIN) 10 MG tablet Take 1 tablet (10 mg total) by mouth daily. 90 tablet 3  . mometasone (NASONEX) 50 MCG/ACT nasal spray Place 2 sprays into the nose daily. 51 g 1  . pimecrolimus (ELIDEL) 1 % cream APPLY TO FACE TWICE DAILY FOR 3 WEEKS,THEN DAILY FOR 3 WEEKS,THEN DAILY AS NEEDED 30 g 5  . sodium chloride (OCEAN) 0.65 % nasal spray PLACE 1 SPRAY INTO THE NOSE AS NEEDED FOR CONGESTION. 30 mL 12  . SUMAtriptan (IMITREX) 100 MG tablet TAKE 1 TABLET BY MOUTH AT HEADACHE ONSET, FOR SEVERE HEADACHE. MAY REPEAT ONCE ONLY IN 24 HOURS, 2 HOURS AFTER 1ST TABLET. MAX USE=TWICE WEEKLY 10 tablet 2  . topiramate (TOPAMAX) 100 MG tablet TAKE 1 TABLET (100 MG TOTAL) BY MOUTH 2 (TWO) TIMES DAILY. 60 tablet 2  . traMADol (ULTRAM) 50 MG tablet Take 50 mg by mouth 2 (two) times daily.    . traMADol (ULTRAM) 50 MG tablet TAKE 2 TABLETS BY MOUTH EVERY 6 HOURS AS NEEDED. 180 tablet 2  . traMADol (ULTRAM) 50  MG tablet TAKE 2 TABLETS BY MOUTH EVERY 6 HOURS AS NEEDED 180 tablet 1  . tretinoin (RETIN-A) 0.025 % cream Apply 1 application topically at bedtime.    Marland Kitchen. venlafaxine XR (EFFEXOR-XR) 150 MG 24 hr capsule Take 1 capsule by mouth daily as directed with 75mg  capsule for total of 225mg  daily . 90 capsule 1  . venlafaxine XR (EFFEXOR-XR) 75 MG 24 hr capsule TAKE 1 CAPSULE BY MOUTH ALONG WITH 1 OF THE 150MG  CAPSULES TO TOTAL 225MG  DAILY 90 capsule 1   No current facility-administered medications for this visit.     Musculoskeletal: Strength & Muscle Tone: N/A Gait & Station: N/A Patient leans: N/A  Psychiatric Specialty Exam: Review of Systems  Psychiatric/Behavioral: Positive for decreased concentration, dysphoric mood and sleep disturbance. Negative for agitation, behavioral problems, confusion, hallucinations, self-injury and suicidal ideas. The patient is nervous/anxious. The patient is not hyperactive.   All other systems reviewed and are negative.   There were no vitals taken for this visit.There is no height or weight on file to calculate BMI.  General Appearance: Fairly Groomed  Eye Contact:  Good  Speech:  Clear and Coherent, slight delay, although improving  Volume:  Normal  Mood:  same  Affect:  Appropriate, Congruent, Restricted and less down  Thought Process:  Coherent  Orientation:  Full (Time, Place, and Person)  Thought Content: Logical   Suicidal Thoughts:  No  Homicidal Thoughts:  No  Memory:  Immediate;   Good  Judgement:  Good  Insight:  Good  Psychomotor Activity:  Normal to slightly increased. Rocking back and forth constantly during the exam  Concentration:  Concentration: Fair and Attention Span: Fair  Recall:  Good  Fund of Knowledge: Good  Language: Good  Akathisia:  No  Handed:  Right  AIMS (if indicated): not done  Assets:  Communication Skills Desire for Improvement  ADL's:  Intact  Cognition: WNL  Sleep:  Poor   Screenings: GAD-7   Flowsheet  Row Video Visit from 07/11/2020 in [facility] Office Visit from 12/29/2018 in [facility] Office Visit from 10/28/2018 in [facility] Office Visit from 01/28/2018 in [facility] Phone Follow Up from 01/08/2018 in [facility]  Total GAD-7 Score 15 21 20 15 10     PHQ2-9   Flowsheet Row Office Visit from 09/18/2020 in [facility] Video Visit from 08/23/2020 in [facility] Counselor from [facility]/17/2020 [facility] Video Visit from 07/24/2020 in [facility] Video Visit from 07/11/2020 in [facility]  PHQ-2 Total Score 6 3 3 4 2   PHQ-9 Total Score 17 13 15 7 13     Flowsheet Row Video Visit from 09/20/2020 in [facility] Video Visit from 08/23/2020 in [facility] Counselor from 08/17/2020 in [facility]  C-SSRS RISK CATEGORY No Risk No Risk No Risk       Assessment and Plan:  Herold Salguero is a 45 y.o. year old male with a history of depression,bilateral carpel tunnel syndrome, who presents for follow up appointment for below.   1. Current moderate episode of major depressive disorder without prior episode (HCC) There has been slow but steady improvement in depressive symptoms since the last visit.  Psychosocial stressors includesloss of his maternal aunt,and unemployment,neuropathy. Will continue current medication regimen.  Will continue venlafaxine to target depression and anxiety.  Will continue rexulti as adjunctive treatment for depression.  Noted that although exam is notable for slightly elevated psychomotor of rocking back and forth, he attributes it to pain.  It is less likely to be akathisia.  We will continue to monitor.   2. Insomnia, unspecified type He has occasional insomnia, which he mainly attributes  to migraine.  Will continue Lunesta to target insomnia.   This clinician has discussed the side effect associated with medication prescribed during this encounter. Please refer to notes in the previous encounters for more details.   Plan I have reviewed and updated plans as below 1.Continuevenlafaxine 225mg  (150 mg + 75 mg)daily 2.ContinueRexulti3mg at night - monitor drowsiness 3.ContinueLunesta 2mg  at night as needed for sleep 4.Next appointment- 6/16 at 11: 30 AMfor [name]. He declined in person visit at this time as he would not be able to drive long distance - on [address] - on topiramate for migraine  Noted that according to the pharmacy, venlafaxine was filled on 10/28 for 90days, and rexulti on 1/6 for 30 days. Will continue to monitor for medication adherence.  Past trials of medication:sertraline, fluoxetine,mirtazapine, nortriptyline for carpel tunnel syndrome,bupropion (drowsiness),quetiapine (drowsiness),Abilify ("off balance"),Trazodone, Ambien (limited benefit),lorazepam (worsening in anxiety, drowsiness)  The patient demonstrates the following risk factors for suicide: Chronic risk factors for suicide include:psychiatric disorder ofdepression. Acute risk factorsfor suicide include: unemployment and loss (financial, interpersonal, professional). Protective factorsfor this patient include: positive social support, responsibility to others (children, family) and hope for the future. Considering these factors, the overall suicide risk at this point appears to below. Patientisappropriate for outpatient follow up.  , MD 09/20/2020, 9:34 AM

## 2020-09-18 ENCOUNTER — Ambulatory Visit: Payer: 59 | Admitting: Family Medicine

## 2020-09-18 ENCOUNTER — Other Ambulatory Visit: Payer: Self-pay

## 2020-09-18 ENCOUNTER — Encounter: Payer: Self-pay | Admitting: Family Medicine

## 2020-09-18 VITALS — BP 125/84 | HR 90 | Resp 15 | Ht 72.0 in | Wt 210.8 lb

## 2020-09-18 DIAGNOSIS — M5416 Radiculopathy, lumbar region: Secondary | ICD-10-CM | POA: Diagnosis not present

## 2020-09-18 DIAGNOSIS — E559 Vitamin D deficiency, unspecified: Secondary | ICD-10-CM

## 2020-09-18 DIAGNOSIS — F322 Major depressive disorder, single episode, severe without psychotic features: Secondary | ICD-10-CM

## 2020-09-18 DIAGNOSIS — R7302 Impaired glucose tolerance (oral): Secondary | ICD-10-CM | POA: Diagnosis not present

## 2020-09-18 DIAGNOSIS — G43011 Migraine without aura, intractable, with status migrainosus: Secondary | ICD-10-CM

## 2020-09-18 DIAGNOSIS — E785 Hyperlipidemia, unspecified: Secondary | ICD-10-CM | POA: Diagnosis not present

## 2020-09-18 NOTE — Assessment & Plan Note (Signed)
Patient educated about the importance of limiting  Carbohydrate intake , the need to commit to daily physical activity for a minimum of 30 minutes , and to commit weight loss. The fact that changes in all these areas will reduce or eliminate all together the development of diabetes is stressed.   Diabetic Labs Latest Ref Rng & Units 07/20/2020 01/31/2020 06/08/2019 05/27/2019 12/29/2018  HbA1c 4.0 - 5.6 % - 6.0(A) - 6.4(H) 6.4(H)  Chol 100 - 199 mg/dL 537 - - 943 276  HDL >14 mg/dL 45 - - 44 70(L)  Calc LDL 0 - 99 mg/dL 295(F) - - 473(U) 037(Q)  Triglycerides 0 - 149 mg/dL 54 - - 92 72  Creatinine 0.76 - 1.27 mg/dL 9.64 - 3.83 8.18 4.03  GFR >60.00 mL/min - - 87.22 - -   BP/Weight 09/18/2020 07/11/2020 01/31/2020 06/08/2019 05/31/2019 12/29/2018 12/18/2018  Systolic BP 125 - 117 114 112 754 -  Diastolic BP 84 - 74 80 80 80 -  Wt. (Lbs) 210.8 205 205 221.38 215 210 200  BMI 28.59 27.8 28.19 30.45 29.57 28.48 27.12  Some encounter information is confidential and restricted. Go to Review Flowsheets activity to see all data.   No flowsheet data found.

## 2020-09-18 NOTE — Progress Notes (Signed)
Gabriel [name]     MRN: [id number]      DOB: 11-Jul-1975   HPI Gabriel [name] is here for follow up and re-evaluation of chronic medical conditions, medication management and review of any available recent lab and radiology data.  Preventive health is updated, specifically  Cancer screening and Immunization.   persistent numbness and tingling in toes of right foot Headaches remain 2 to 3 times / week, may go as long as 1 week without, not necessarily related to stress, states topamax has been benficial wih headaches and immitrex one tablet aborts the headache when it occurs C/o tingling in his hands and feet with weakness in the hands, unable load dishwasher  At home, does not watch TV , read or be involved in anything  ROS Denies recent fever or chills. Denies sinus pressure, nasal congestion, ear pain or sore throat. Denies chest congestion, productive cough or wheezing. Denies chest pains, palpitations and leg swelling Denies abdominal pain, nausea, vomiting,diarrhea or constipation.   Denies dysuria, frequency, hesitancy or incontinence.  Denies skin break down or rash.   PE  BP 125/84   Pulse 90   Resp 15   Ht 6' (1.829 m)   Wt 210 lb 12.8 oz (95.6 kg)   SpO2 97%   BMI 28.59 kg/m   Patient alert and oriented and in no cardiopulmonary distress.  HEENT: No facial asymmetry, EOMI,     Neck supple .  Chest: Clear to auscultation bilaterally.  CVS: S1, S2 no murmurs, no S3.Regular rate.  ABD: Soft non tender.   Ext: No edema  MS: Adequate ROM spine, shoulders, hips and knees.  Skin: Intact, no ulcerations or rash noted.  Psych: Good eye contact, flat affect. Memory intact both  anxious and  depressed appearing.  CNS: CN 2-12 intact, power,  normal throughout.no focal deficits noted.   Assessment & Plan  Migraine Improved on current medication, continue same  Dyslipidemia, goal LDL below 100 Hyperlipidemia:Low fat diet discussed and encouraged.   Lipid  Panel  Lab Results  Component Value Date   CHOL 171 07/20/2020   HDL 45 07/20/2020   LDLCALC 115 (H) 07/20/2020   TRIG 54 07/20/2020   CHOLHDL 3.8 07/20/2020   Updated lab needed at/ before next visit.    IGT (impaired glucose tolerance) Patient educated about the importance of limiting  Carbohydrate intake , the need to commit to daily physical activity for a minimum of 30 minutes , and to commit weight loss. The fact that changes in all these areas will reduce or eliminate all together the development of diabetes is stressed.   Diabetic Labs Latest Ref Rng & Units 07/20/2020 01/31/2020 06/08/2019 05/27/2019 12/29/2018  HbA1c 4.0 - 5.6 % - 6.0(A) - 6.4(H) 6.4(H)  Chol 100 - 199 mg/dL 381 - - 829 937  HDL >16 mg/dL 45 - - 44 96(V)  Calc LDL 0 - 99 mg/dL 893(Y) - - 101(B) 510(C)  Triglycerides 0 - 149 mg/dL 54 - - 92 72  Creatinine 0.76 - 1.27 mg/dL 5.85 - 2.77 8.24 2.35  GFR >60.00 mL/min - - 87.22 - -   BP/Weight 09/18/2020 07/11/2020 01/31/2020 06/08/2019 05/31/2019 12/29/2018 12/18/2018  Systolic BP 125 - 117 114 112 361 -  Diastolic BP 84 - 74 80 80 80 -  Wt. (Lbs) 210.8 205 205 221.38 215 210 200  BMI 28.59 27.8 28.19 30.45 29.57 28.48 27.12  Some encounter information is confidential and restricted. Go to Review Flowsheets activity to see  all data.   No flowsheet data found.    Vitamin D deficiency Updated lab needed at/ before next visit.   Right lumbar radiculopathy Managed by  Neurology and still c/o uncontrolled pain, will continue to follow  Depression, major, single episode, severe (HCC) Score of 18, not homicidal or suicidal, Psych to be informed , and I encourage him to consider alternative options

## 2020-09-18 NOTE — Assessment & Plan Note (Signed)
Managed by  Neurology and still c/o uncontrolled pain, will continue to follow

## 2020-09-18 NOTE — Assessment & Plan Note (Signed)
Updated lab needed at/ before next visit.   

## 2020-09-18 NOTE — Assessment & Plan Note (Signed)
Hyperlipidemia:Low fat diet discussed and encouraged.   Lipid Panel  Lab Results  Component Value Date   CHOL 171 07/20/2020   HDL 45 07/20/2020   LDLCALC 115 (H) 07/20/2020   TRIG 54 07/20/2020   CHOLHDL 3.8 07/20/2020   Updated lab needed at/ before next visit.

## 2020-09-18 NOTE — Patient Instructions (Addendum)
Annual exam in office with mD mid to end Septemebr, call if you need   Glyco HB at [facility] visit.   Please try to start exercise 2 to 3 days / week  Thankful headaches are improving, we will continue to work on other health issue.  Keep an opren mind to any treatment that will help you to feel beter  Thanks for choosing [facility], we consider it a privelige to serve you.

## 2020-09-18 NOTE — Assessment & Plan Note (Signed)
Score of 18, not homicidal or suicidal, Psych to be informed , and I encourage him to consider alternative options

## 2020-09-18 NOTE — Assessment & Plan Note (Signed)
Improved on current medication, continue same 

## 2020-09-20 ENCOUNTER — Telehealth (INDEPENDENT_AMBULATORY_CARE_PROVIDER_SITE_OTHER): Payer: 59 | Admitting: Psychiatry

## 2020-09-20 ENCOUNTER — Other Ambulatory Visit (HOSPITAL_COMMUNITY): Payer: Self-pay

## 2020-09-20 ENCOUNTER — Other Ambulatory Visit: Payer: Self-pay

## 2020-09-20 ENCOUNTER — Encounter: Payer: Self-pay | Admitting: Psychiatry

## 2020-09-20 DIAGNOSIS — F321 Major depressive disorder, single episode, moderate: Secondary | ICD-10-CM | POA: Diagnosis not present

## 2020-09-20 DIAGNOSIS — G4700 Insomnia, unspecified: Secondary | ICD-10-CM | POA: Diagnosis not present

## 2020-09-20 MED ORDER — ESZOPICLONE 2 MG PO TABS
2.0000 mg | ORAL_TABLET | Freq: Every evening | ORAL | 1 refills | Status: DC | PRN
  Filled 2020-09-20: qty 30, 30d supply, fill #0

## 2020-09-20 MED FILL — Brexpiprazole Tab 3 MG: ORAL | 90 days supply | Qty: 90 | Fill #0 | Status: AC

## 2020-09-20 NOTE — Patient Instructions (Signed)
I have reviewed and updated plans as below 1.Continuevenlafaxine 225mg  (150 mg + 75 mg)daily 2.ContinueRexulti3mg at night  3.ContinueLunesta 2mg  at night as needed for sleep 4.Next appointment- 6/16 at 11: 30 AM

## 2020-09-21 ENCOUNTER — Other Ambulatory Visit (HOSPITAL_COMMUNITY): Payer: Self-pay

## 2020-09-22 ENCOUNTER — Ambulatory Visit (INDEPENDENT_AMBULATORY_CARE_PROVIDER_SITE_OTHER): Payer: 59 | Admitting: Psychiatry

## 2020-09-22 ENCOUNTER — Other Ambulatory Visit: Payer: Self-pay

## 2020-09-22 DIAGNOSIS — F321 Major depressive disorder, single episode, moderate: Secondary | ICD-10-CM | POA: Diagnosis not present

## 2020-09-22 NOTE — Progress Notes (Signed)
Virtual Visit via Video Note  I connected with Gabriel [name] on 09/22/20 at 9:10 AM EDT  by a video enabled telemedicine application and verified that I am speaking with the correct person using two identifiers.  Location: Patient: Home Provider: [facility] [place] office   I discussed the limitations of evaluation and management by telemedicine and the availability of in person appointments. The patient expressed understanding and agreed to proceed.  I provided 40  minutes of non-face-to-face time during this encounter.   [name], LCSW    THERAPIST PROGRESS NOTE    Session Time:  Friday 09/22/2020 9:10 AM - 9:50 AM  Participation Level: Active  Behavioral Response: AlertDepressed/rocking frequently  Type of Therapy: Individual Therapy  Treatment Goals addressed:  alleviate depressive symptoms and resume normal involvement and activity and social activity, learn and implement cognitive and behavioral strategies to overcome depression,    Interventions: CBT and Supportive  Summary: Gabriel [name] is a 45 y.o. male who is referred for services by PCP Dr. Syliva Overman due to patient experiencing symptoms of depression. He denies any psychiatric hospitalizations, He saw therapist Dorann Lodge in [place] for a few months. Patient reports symptoms began when he lost his job about a year ago. Per his report, he was fired after he complained about race discrimination. 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 He states now feeling as though he failed his family. He states not wanting to be around anyone and having difficulty engaging with his wife and children. He fears losing his family.  He reports ruminating thoughts about job and the past, excessive worry depressed mood, tearfulness, anxiety, isolative behaviors, poor motivation, poor  concentration, irritability, sleep difficulty, thoughts and feelings of hopelessness and worthlessness. He denies any suicidal ideations.  Patient last was seen via virtual visit about 2 weeks ago. He continues to experience  moderate symptoms of depression including fatigue, poor concentration, and memory difficulty.  He has made efforts to decrease isolative behaviors.  He reports spending less time in his and actually going into the den and being around his family about 3-4 times per week.  He reports eating dinner with them about 3-4 times per week.  He reports clearing the dishes off the table last night and is very pleased with his efforts regarding this.  He also implemented plan of opening the blinds and says he has been doing this about 4-5 times per week.  He is pleased with his efforts and reports continued strong support from his wife.    SI/HI .Nowithout intent/plan  Therapist Response:  reviewed symptoms, praised and reinforced patient's implementing plan to open blinds developed in last session, discussed effects on patient and interaction with his wife, raised and reinforced patient's efforts to decrease isolated behaviors, discussed effects, assisted patient examine his interaction with his sons, assisted patient identify ways to increase engagement with his sons, developed plan with patient to increase engagement by resuming asking children about their day in school, discussing their interests, and perhaps watching part of a basketball game or video game with sons,reviewed (DARE -defusion, acceptance of discomfort, realistic goals, and embracing values ) to cope with painful feelings and thoughts,  Plan: Return again in 1-2  weeks  Diagnosis: Axis I: MDD, single episode, severe    Axis II: No diagnosis  [name], LCSW 09/22/2020

## 2020-09-28 ENCOUNTER — Other Ambulatory Visit (HOSPITAL_COMMUNITY): Payer: Self-pay

## 2020-09-28 DIAGNOSIS — L219 Seborrheic dermatitis, unspecified: Secondary | ICD-10-CM | POA: Diagnosis not present

## 2020-09-28 DIAGNOSIS — B360 Pityriasis versicolor: Secondary | ICD-10-CM | POA: Diagnosis not present

## 2020-09-28 DIAGNOSIS — L2084 Intrinsic (allergic) eczema: Secondary | ICD-10-CM | POA: Diagnosis not present

## 2020-09-28 MED ORDER — KETOCONAZOLE 2 % EX SHAM
MEDICATED_SHAMPOO | CUTANEOUS | 11 refills | Status: DC
  Filled 2020-09-28: qty 120, 30d supply, fill #0
  Filled 2021-04-30: qty 120, 30d supply, fill #1

## 2020-09-28 MED ORDER — FLUOCINONIDE 0.05 % EX OINT
TOPICAL_OINTMENT | CUTANEOUS | 3 refills | Status: DC
  Filled 2020-09-28: qty 60, 30d supply, fill #0

## 2020-09-29 ENCOUNTER — Other Ambulatory Visit (HOSPITAL_COMMUNITY): Payer: Self-pay

## 2020-10-06 ENCOUNTER — Ambulatory Visit (HOSPITAL_COMMUNITY): Payer: 59 | Admitting: Psychiatry

## 2020-10-11 ENCOUNTER — Other Ambulatory Visit (HOSPITAL_COMMUNITY): Payer: Self-pay

## 2020-10-11 DIAGNOSIS — Z79891 Long term (current) use of opiate analgesic: Secondary | ICD-10-CM | POA: Diagnosis not present

## 2020-10-11 DIAGNOSIS — G5603 Carpal tunnel syndrome, bilateral upper limbs: Secondary | ICD-10-CM | POA: Diagnosis not present

## 2020-10-11 DIAGNOSIS — E1142 Type 2 diabetes mellitus with diabetic polyneuropathy: Secondary | ICD-10-CM | POA: Diagnosis not present

## 2020-10-11 DIAGNOSIS — M5416 Radiculopathy, lumbar region: Secondary | ICD-10-CM | POA: Diagnosis not present

## 2020-10-11 DIAGNOSIS — M792 Neuralgia and neuritis, unspecified: Secondary | ICD-10-CM | POA: Diagnosis not present

## 2020-10-11 MED ORDER — TRAMADOL HCL 50 MG PO TABS
100.0000 mg | ORAL_TABLET | Freq: Four times a day (QID) | ORAL | 0 refills | Status: DC | PRN
  Filled 2020-10-11 – 2020-10-12 (×3): qty 180, 23d supply, fill #0

## 2020-10-11 MED ORDER — BUPRENORPHINE HCL 2 MG SL SUBL
2.0000 mg | SUBLINGUAL_TABLET | Freq: Every day | SUBLINGUAL | 0 refills | Status: DC
  Filled 2020-10-11: qty 28, 30d supply, fill #0

## 2020-10-12 ENCOUNTER — Other Ambulatory Visit (HOSPITAL_COMMUNITY): Payer: Self-pay

## 2020-10-13 ENCOUNTER — Other Ambulatory Visit (HOSPITAL_COMMUNITY): Payer: Self-pay

## 2020-10-20 ENCOUNTER — Ambulatory Visit (INDEPENDENT_AMBULATORY_CARE_PROVIDER_SITE_OTHER): Payer: 59 | Admitting: Psychiatry

## 2020-10-20 ENCOUNTER — Other Ambulatory Visit: Payer: Self-pay

## 2020-10-20 DIAGNOSIS — F321 Major depressive disorder, single episode, moderate: Secondary | ICD-10-CM | POA: Diagnosis not present
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
## 2020-10-26 ENCOUNTER — Other Ambulatory Visit (HOSPITAL_COMMUNITY): Payer: Self-pay

## 2020-10-26 ENCOUNTER — Encounter: Payer: Self-pay | Admitting: Psychiatry

## 2020-10-26 ENCOUNTER — Telehealth (INDEPENDENT_AMBULATORY_CARE_PROVIDER_SITE_OTHER): Payer: 59 | Admitting: Psychiatry

## 2020-10-26 ENCOUNTER — Other Ambulatory Visit: Payer: Self-pay

## 2020-10-26 DIAGNOSIS — F321 Major depressive disorder, single episode, moderate: Secondary | ICD-10-CM

## 2020-10-26 DIAGNOSIS — G4700 Insomnia, unspecified: Secondary | ICD-10-CM

## 2020-10-26 MED ORDER — BREXPIPRAZOLE 3 MG PO TABS
3.0000 mg | ORAL_TABLET | Freq: Every day | ORAL | 0 refills | Status: DC
  Filled 2020-10-26: qty 90, 30d supply, fill #0
  Filled 2020-12-26: qty 90, 90d supply, fill #0

## 2020-10-26 MED ORDER — ESZOPICLONE 2 MG PO TABS
2.0000 mg | ORAL_TABLET | Freq: Every evening | ORAL | 0 refills | Status: DC | PRN
  Filled 2020-10-26: qty 30, 30d supply, fill #0

## 2020-10-26 NOTE — Patient Instructions (Signed)
1. Continue venlafaxine 225 mg (150 mg + 75 mg) daily 2. Continue Rexulti 3 mg at night - 3. Continue Lunesta 2 mg at night as needed for sleep  4. Next appointment- 7/28 at 9:30 AM

## 2020-11-03 ENCOUNTER — Other Ambulatory Visit: Payer: Self-pay

## 2020-11-03 ENCOUNTER — Ambulatory Visit (INDEPENDENT_AMBULATORY_CARE_PROVIDER_SITE_OTHER): Payer: 59 | Admitting: Psychiatry

## 2020-11-03 DIAGNOSIS — F321 Major depressive disorder, single episode, moderate: Secondary | ICD-10-CM

## 2020-11-03 NOTE — Progress Notes (Signed)
Virtual Visit via Video Note  I connected with Gabriel [name] on 11/03/20 at  9:00 AM EDT by a video enabled telemedicine application and verified that I am speaking with the correct person using two identifiers.  Location: Patient: Home Provider:  [facility] [place] office    I discussed the limitations of evaluation and management by telemedicine and the availability of in person appointments. The patient expressed understanding and agreed to proceed.  I provided  55 minutes of non-face-to-face time during this encounter.   [name], LCSW    Comprehensive Clinical Assessment (CCA) Note  11/03/2020 Prashant Glosser [id number]  Chief Complaint:  Chief Complaint  Patient presents with   Depression   Visit Diagnosis: MDD     CCA Biopsychosocial Intake/Chief Complaint:  ' I still am having problems with losing my job due to racism, This has taken a toll on my life. In addition to that, I deal with a lot of physical pain and it is hard to cope with that as well"  Current Symptoms/Problems: " I isolate myself from my family at times, when I am around them, I sometimes lash out at them, I have mood swings, difficulty coping with not being breadwinner for my family, negative thoughts about self, feel like I let down my family"   Patient Reported Schizophrenia/Schizoaffective Diagnosis in Past: No   Strengths: I can't think of anything right now  Preferences: Inidvidual therapy  Abilities: No data recorded  Type of Services Patient Feels are Needed: Individual therapy/ learn how to deal and cope with what I have going on   Initial Clinical Notes/Concerns: Patient initially  is referred for services by PCP Dr. Syliva Overman due to patient experiencing symptoms of depression. He denies any psychiatric hospitalizations, He saw therapist Dorann Lodge in [place] for a few months. He continues to see psychiatrist Dr. Vanetta Shawl for medication mangement   Mental  Health Symptoms Depression:   Difficulty Concentrating; Fatigue; Hopelessness; Increase/decrease in appetite; Irritability; Sleep (too much or little); Tearfulness; Worthlessness; Change in energy/activity   Duration of Depressive symptoms:  Greater than two weeks   Mania:   N/A   Anxiety:    Difficulty concentrating; Fatigue; Irritability; Restlessness; Sleep; Tension; Worrying   Psychosis:   None   Duration of Psychotic symptoms: No data recorded  Trauma:   N/A   Obsessions:   N/A   Compulsions:   N/A   Inattention:   N/A   Hyperactivity/Impulsivity:   N/A   Oppositional/Defiant Behaviors:   N/A   Emotional Irregularity:   N/A   Other Mood/Personality Symptoms:  No data recorded   Mental Status Exam Appearance and self-care  Stature:   Tall   Weight:   Average weight   Clothing:   Casual   Grooming:   Normal   Cosmetic use:   None   Posture/gait:  No data recorded  Motor activity:  No data recorded  Sensorium  Attention:   Distractible   Concentration:   Scattered   Orientation:   X5   Recall/memory:   Defective in Immediate; Defective in Short-term; Defective in Recent; Defective in Remote   Affect and Mood  Affect:   Anxious; Depressed   Mood:   Anxious; Depressed   Relating  Eye contact:   Normal   Facial expression:   Depressed   Attitude toward examiner:   Cooperative   Thought and Language  Speech flow:  Normal   Thought content:   Appropriate to Mood and Circumstances  Preoccupation:   Ruminations   Hallucinations:   None (None)   Organization:  No data recorded  [employer] of Knowledge:   Average   Intelligence:   Average   Abstraction:   Normal   Judgement:   Normal   Reality Testing:   Realistic   Insight:   Good   Decision Making:   Only simple   Social Functioning  Social Maturity:   Isolates   Social Judgement:   Victimized   Stress  Stressors:   Family  conflict; Transitions; Work   Coping Ability:   Human resources officer Deficits:  No data recorded  Supports:   Family     Religion: Religion/Spirituality Are You A Religious Person?: Yes What is Your Religious Affiliation?: Environmental consultant: Leisure / Recreation Do You Have Hobbies?: No  Exercise/Diet: Exercise/Diet Do You Exercise?: Yes What Type of Exercise Do You Do?: Run/Walk (walks up and down the driveway 15 minutes with wife encouraging me) How Many Times a Week Do You Exercise?: 1-3 times a week Have You Gained or Lost A Significant Amount of Weight in the Past Six Months?: No Do You Follow a Special Diet?: No Do You Have Any Trouble Sleeping?: Yes Explanation of Sleeping Difficulties: difficulty staying asleep   CCA Employment/Education Employment/Work Situation: Employment / Work Situation Employment Situation: Unemployed What is the Longest Time Patient has Held a Job?: 10 years Where was the Patient Employed at that Time?: [employer] Has Patient ever Been in the [employer]?: No  Education: Education Last Grade Completed: 12 Name of High School: Manufacturing engineer [employer] Did Garment/textile technologist From [employer]?: Yes Did Theme park manager?: No Did Designer, television/film set?: No Did You Have Any Scientist, research (life sciences) In School?: sports (basketball) Did You Have An Individualized Education Program (IIEP): No Did You Have Any Difficulty At School?: No   CCA Family/Childhood History Family and Relationship History: Family history Marital status: Married Number of Years Married: 12 What types of issues is patient dealing with in the relationship?: no issues - get along pretty good Are you sexually active?: No What is your sexual orientation?: heterosexual Has your sexual activity been affected by drugs, alcohol, medication, or emotional stress?: yes - emotional stress, do desire or energy for sex. Does patient have children?: Yes How many children?: 2  (sons - ages 74 and 83) How is patient's relationship with their children?: It is okay, It could be better - I do isolate myself at times  Childhood History:  Childhood History By whom was/is the patient raised?: Mother (He had periodic contact with his father, parents were not married) Additional childhood history information: Patient was born and raised in [place], [place] areas. Description of patient's relationship with caregiver when they were a child: [place], mother was a Chief Executive Officer Patient's description of current relationship with people who raised him/her: good relationship with mother How were you disciplined when you got in trouble as a child/adolescent?: spankings Does patient have siblings?: No Did patient suffer any verbal/emotional/physical/sexual abuse as a child?: No (Remembers incident in childhood when riding bike and caucasian kids throwing rocks and saying racial slurs) Did patient suffer from severe childhood neglect?: No Has patient ever been sexually abused/assaulted/raped as an adolescent or adult?: No Was the patient ever a victim of a crime or a disaster?: No Witnessed domestic violence?: No Has patient been affected by domestic violence as an adult?: No  Child/Adolescent Assessment:     CCA Substance Use  Alcohol/Drug Use: Alcohol / Drug Use Pain Medications: See patient record Prescriptions: See patient record Over the Counter: See patient record History of alcohol / drug use?: No history of alcohol / drug abuse   ASAM's:  Six Dimensions of Multidimensional Assessment  Dimension 1:  Acute Intoxication and/or Withdrawal Potential:   Dimension 1:  Description of individual's past and current experiences of substance use and withdrawal: none  Dimension 2:  Biomedical Conditions and Complications:      Dimension 3:  Emotional, Behavioral, or Cognitive Conditions and Complications:     Dimension 4:  Readiness to Change:     Dimension 5:  Relapse, Continued  use, or Continued Problem Potential:     Dimension 6:  Recovery/Living Environment:     ASAM Severity Score: ASAM's Severity Rating Score: 0  ASAM Recommended Level of Treatment:     Substance use Disorder (SUD)   Recommendations for Services/Supports/Treatments: Recommendations for Services/Supports/Treatments Recommendations For Services/Supports/Treatments: Individual Therapy, Medication Management  DSM5 Diagnoses: Patient Active Problem List   Diagnosis Date Noted   Intractable migraine without aura and with status migrainosus 07/16/2020   Migraine 07/11/2020   Infection of toenail 03/15/2020   Tinea versicolor 03/15/2020   Prediabetes 01/31/2020   Dyslipidemia, goal LDL below 100 06/14/2019   Right lumbar radiculopathy 05/31/2019   Elevated LFTs 05/31/2019   Current moderate episode of major depressive disorder without prior episode (HCC) 05/12/2019   Discolored nails 11/01/2018   Anxiety 01/29/2018   Depression, major, single episode, severe (HCC) 01/29/2018   Vitamin D deficiency 01/29/2018   Acne vulgaris 01/15/2018   Seborrheic dermatitis 01/15/2018   Traumatic injury to skin or subcutaneous tissue 01/15/2018   Bilateral carpal tunnel syndrome 01/14/2018   Insomnia 07/11/2017   Chronic hand pain 07/10/2017   Overweight (BMI 25.0-29.9) 07/03/2013   IGT (impaired glucose tolerance) 10/31/2012   Perennial allergic rhinitis 10/19/2012   Intrinsic atopic dermatitis 10/19/2012    Patient Centered Plan: Patient is on the following Treatment Plan(s):  Depression   Referrals to Alternative Service(s): Referred to Alternative Service(s):   Place:   Date:   Time:    Referred to Alternative Service(s):   Place:   Date:   Time:    Referred to Alternative Service(s):   Place:   Date:   Time:    Referred to Alternative Service(s):   Place:   Date:   Time:     [name], LCSW

## 2020-11-17 ENCOUNTER — Ambulatory Visit (INDEPENDENT_AMBULATORY_CARE_PROVIDER_SITE_OTHER): Payer: 59 | Admitting: Psychiatry

## 2020-11-17 ENCOUNTER — Other Ambulatory Visit: Payer: Self-pay

## 2020-11-17 DIAGNOSIS — F321 Major depressive disorder, single episode, moderate: Secondary | ICD-10-CM | POA: Diagnosis not present

## 2020-11-17 NOTE — Progress Notes (Signed)
Virtual Visit via Video Note  I connected with Gabriel [name] on 11/17/20 at 9:16 AM EDT by a video enabled telemedicine application and verified that I am speaking with the correct person using two identifiers.  Location: Patient: Home Provider: [facility] [place] office    I discussed the limitations of evaluation and management by telemedicine and the availability of in person appointments. The patient expressed understanding and agreed to proceed.  I provided 47 minutes of non-face-to-face time during this encounter.   [name], LCSW    THERAPIST PROGRESS NOTE    Session Time:  Friday 11/17/2020 9:16 AM - 10:03 AM   Participation Level: Active  Behavioral Response: AlertDepressed/rocking frequently  Type of Therapy: Individual Therapy  Treatment Goals addressed:  alleviate depressive symptoms and resume normal involvement and activity and social activity, learn and implement cognitive and behavioral strategies to overcome depression,    Interventions: CBT and Supportive  Summary: Gabriel [name] is a 45 y.o. male who is referred for services by PCP Dr. Syliva Overman due to patient experiencing symptoms of depression. He denies any psychiatric hospitalizations, He saw therapist Dorann Lodge in [place] for a few months. Patient reports symptoms began when he lost his job about a year ago. Per his report, he was fired after he complained about race discrimination. 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 He states now feeling as though he failed his family. He states not wanting to be around anyone and having difficulty engaging with his wife and children. He fears losing his family.  He reports ruminating thoughts about job and the past, excessive worry depressed mood, tearfulness, anxiety, isolative behaviors, poor motivation, poor  concentration, irritability, sleep difficulty, thoughts and feelings of hopelessness and worthlessness. He denies any suicidal ideations.  Patient last was seen via virtual visit about 2 weeks ago. He continues to experience  moderate symptoms of depression including fatigue, loss of appetite, depressed mood, poor concentration, and memory difficulty.  He reports increased sadness and negative thoughts about self along with feelings of worthlessness.  Per patient's report, this was triggered by increased pain preventing him from participating in various activities.  He reports then having more thoughts about losing his job.  He expresses guilt and regret.  He worries he may never be able to financially support his family as well as expresses fears his wife may leave him.        SI/HI .Nowithout intent/plan  Therapist Response:  reviewed symptoms, discussed stressors, facilitated expression of feelings, validated feelings, discussed self compassion, assisted patient identify connection between pain and depression, began to discuss next steps for treatment particularly in coping with pain  Plan: Return again in 1-2  weeks  Diagnosis: Axis I: MDD, single episode, severe    Axis II: No diagnosis    [name], LCSW 11/17/2020

## 2020-11-20 ENCOUNTER — Other Ambulatory Visit (HOSPITAL_COMMUNITY): Payer: Self-pay

## 2020-11-20 MED FILL — Tramadol HCl Tab 50 MG: ORAL | 22 days supply | Qty: 180 | Fill #0 | Status: CN

## 2020-11-21 ENCOUNTER — Other Ambulatory Visit (HOSPITAL_COMMUNITY): Payer: Self-pay

## 2020-11-21 MED FILL — Tramadol HCl Tab 50 MG: ORAL | 23 days supply | Qty: 180 | Fill #0 | Status: CN

## 2020-11-23 ENCOUNTER — Other Ambulatory Visit (HOSPITAL_COMMUNITY): Payer: Self-pay

## 2020-11-27 ENCOUNTER — Other Ambulatory Visit (HOSPITAL_COMMUNITY): Payer: Self-pay

## 2020-11-27 MED FILL — Tramadol HCl Tab 50 MG: ORAL | 23 days supply | Qty: 180 | Fill #0 | Status: CN

## 2020-11-29 ENCOUNTER — Other Ambulatory Visit (HOSPITAL_COMMUNITY): Payer: Self-pay

## 2020-11-29 DIAGNOSIS — E1142 Type 2 diabetes mellitus with diabetic polyneuropathy: Secondary | ICD-10-CM | POA: Diagnosis not present

## 2020-11-29 DIAGNOSIS — Z79891 Long term (current) use of opiate analgesic: Secondary | ICD-10-CM | POA: Diagnosis not present

## 2020-11-29 DIAGNOSIS — Z79899 Other long term (current) drug therapy: Secondary | ICD-10-CM | POA: Diagnosis not present

## 2020-11-29 DIAGNOSIS — G5603 Carpal tunnel syndrome, bilateral upper limbs: Secondary | ICD-10-CM | POA: Diagnosis not present

## 2020-11-29 DIAGNOSIS — M792 Neuralgia and neuritis, unspecified: Secondary | ICD-10-CM | POA: Diagnosis not present

## 2020-11-29 DIAGNOSIS — M5416 Radiculopathy, lumbar region: Secondary | ICD-10-CM | POA: Diagnosis not present

## 2020-11-29 MED ORDER — TRAMADOL HCL 50 MG PO TABS
100.0000 mg | ORAL_TABLET | Freq: Four times a day (QID) | ORAL | 2 refills | Status: DC | PRN
  Filled 2020-11-29: qty 180, 23d supply, fill #0

## 2020-11-29 MED ORDER — BUPRENORPHINE HCL 2 MG SL SUBL
2.0000 mg | SUBLINGUAL_TABLET | Freq: Every day | SUBLINGUAL | 2 refills | Status: DC
  Filled 2020-11-29: qty 28, 28d supply, fill #0

## 2020-11-30 ENCOUNTER — Other Ambulatory Visit (HOSPITAL_COMMUNITY): Payer: Self-pay

## 2020-12-01 ENCOUNTER — Ambulatory Visit (INDEPENDENT_AMBULATORY_CARE_PROVIDER_SITE_OTHER): Payer: 59 | Admitting: Psychiatry

## 2020-12-01 ENCOUNTER — Other Ambulatory Visit: Payer: Self-pay

## 2020-12-01 DIAGNOSIS — F321 Major depressive disorder, single episode, moderate: Secondary | ICD-10-CM

## 2020-12-01 NOTE — Progress Notes (Signed)
Virtual Visit via Video Note  I connected with [name] on 12/01/20 at 9:12 AM EDT by a video enabled telemedicine application and verified that I am speaking with the correct person using two identifiers.  Location: Patient: Home Provider: [facility] [place] office     I provided 35 minutes of non-face-to-face time during this encounter.   [name], LCSW   THERAPIST PROGRESS NOTE    Session Time:  Friday 12/01/2020 9:12 AM -9:47  AM   Participation Level: Active  Behavioral Response: AlertDepressed/rocking frequently  Type of Therapy: Individual Therapy  Treatment Goals addressed:  alleviate depressive symptoms and resume normal involvement and activity and social activity, learn and implement cognitive and behavioral strategies to overcome depression,    Interventions: CBT and Supportive  Summary: Gabriel [name] is a 45 y.o. male who is referred for services by PCP Dr. Syliva Overman due to patient experiencing symptoms of depression. He denies any psychiatric hospitalizations, He saw therapist Dorann Lodge in [place] for a few months. Patient reports symptoms began when he lost his job about a year ago. Per his report, he was fired after he complained about race discrimination. 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 He states now feeling as though he failed his family. He states not wanting to be around anyone and having difficulty engaging with his wife and children. He fears losing his family.  He reports ruminating thoughts about job and the past, excessive worry depressed mood, tearfulness, anxiety, isolative behaviors, poor motivation, poor concentration, irritability, sleep difficulty, thoughts and feelings of hopelessness and worthlessness. He denies any suicidal ideations.  Patient last was seen via virtual visit  about 2 weeks ago. He continues to experience  moderate symptoms of depression including fatigue, loss of appetite, poor concentration, and memory difficulty.  He reports increased depressed mood and thoughts of letting his family down again.  Per his report, he had a disability hearing via phone last week.  He reports he has not received letter regarding the results but expresses frustration with self as he had memory difficulty in trying to answer questions during the hearing.  Per patient's report, it was indicated during the hearing there are some jobs he can still perform.  He expresses frustration as he states that no one believes him.  He continues to suffer from chronic pain.  He reports increased worry and sleep difficulty.  Patient and therapist agreed to end session early as patient is not feeling well    SI/HI .Nowithout intent/plan  Therapist Response:  reviewed symptoms, discussed stressors, facilitated expression of feelings, validated feelings, discussed self compassion, provided psychoeducation on anxiety and the stress response, discussed rationale for and assisted patient practice deep breathing to trigger relaxation response, develop plan with patient to practice deep breathing 5 to 10 minutes 2 times per day Plan: Return again in 1-2  weeks  Diagnosis: Axis I: MDD, single episode, severe    Axis II: No diagnosis    [name], LCSW 12/01/2020

## 2020-12-04 NOTE — Progress Notes (Signed)
Virtual Visit via Video Note  I connected with Gabriel [name] on 12/07/20 at  9:30 AM EDT by a video enabled telemedicine application and verified that I am speaking with the correct person using two identifiers.  Location: Patient: home Provider: office Persons participated in the visit- patient, provider    I discussed the limitations of evaluation and management by telemedicine and the availability of in person appointments. The patient expressed understanding and agreed to proceed.    I discussed the assessment and treatment plan with the patient. The patient was provided an opportunity to ask questions and all were answered. The patient agreed with the plan and demonstrated an understanding of the instructions.   The patient was advised to call back or seek an in-person evaluation if the symptoms worsen or if the condition fails to improve as anticipated.  I provided 15 minutes of non-face-to-face time during this encounter.   [name], MD    [facility] MD/PA/NP OP Progress Note  12/07/2020 9:53 AM Gabriel [name]  MRN:  [id number]  Chief Complaint:  Chief Complaint   Follow-up; Depression    HPI:  This is a follow-up appointment for depression and insomnia.  He states that he is not doing well due to having migraine few times a week.  He agrees to contact with his provider to see if he can have any medication change.  He is also stressed due to his numbness on his feet, and carpal tunnel syndrome.  Although he still takes a walk with his wife, it has been less compared to before, mainly secondary to pain.  When he was asked about medication adherence, he states that he believes he has been taking medication regularly.  He has insomnia, which she attributes to pain.  He feels fatigue.  He feels depressed.  He has low appetite.  He has difficulty in concentration.  He denies SI.   After this visit, his wife contacted the nurse.  According to her, he has been taking medication  regularly.   Daily routine: takes a walk with his wife a few times per week Employment: unemployed. 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 He was raised by his mother. He reports good relationship with her, who taught him "love and respect." He has fair relationship with his father  Visit Diagnosis: No diagnosis found.  Past Psychiatric History: Please see initial evaluation for full details. I have reviewed the history. No updates at this time.     Past Medical History:  Past Medical History:  Diagnosis Date   Anxiety    Carpal tunnel syndrome 2018   Depression    Depression    Phreesia 05/15/2020   Elevated LFTs    Heart murmur    Insomnia    Seasonal allergies    Vitamin D deficiency     Past Surgical History:  Procedure Laterality Date   NO PAST SURGERIES      Family Psychiatric History: Please see initial evaluation for full details. I have reviewed the history. No updates at this time.     Family History:  Family History  Problem Relation Age of Onset   Diabetes Father    Hypertension Father    Hypertension Mother    Hypertension Maternal Grandfather    Diabetes Maternal Grandfather    Cancer Paternal  Grandmother        type unknown   Hypertension Paternal Grandmother    Cancer Paternal Grandfather        type unknown   Hypertension Paternal Grandfather     Social History:  Social History   Socioeconomic History   Marital status: Married    Spouse name: Not on file   Number of children: 2   Years of education: 12   Highest education level: High school graduate  Occupational History   Occupation: not employed  Tobacco Use   Smoking status: Never   Smokeless tobacco: Never  Vaping Use   Vaping Use: Never used  Substance and Sexual Activity   Alcohol use: No     Alcohol/week: 0.0 standard drinks   Drug use: No   Sexual activity: Yes    Birth control/protection: None  Other Topics Concern   Not on file  Social History Narrative   He lives with wife and two children.   Unemployed at this present time   Right handed   One story home   Highest level of education:  12th grade   Social Determinants of Health   Financial Resource Strain: Not on file  Food Insecurity: Not on file  Transportation Needs: Not on file  Physical Activity: Not on file  Stress: Not on file  Social Connections: Not on file    Allergies: No Known Allergies  Metabolic Disorder Labs: Lab Results  Component Value Date   HGBA1C 6.0 (A) 01/31/2020   MPG 137 05/27/2019   MPG 137 12/29/2018   No results found for: PROLACTIN Lab Results  Component Value Date   CHOL 171 07/20/2020   TRIG 54 07/20/2020   HDL 45 07/20/2020   CHOLHDL 3.8 07/20/2020   VLDL 10 12/04/2016   LDLCALC 115 (H) 07/20/2020   LDLCALC 131 (H) 05/27/2019   Lab Results  Component Value Date   TSH 2.450 07/20/2020   TSH 1.93 06/08/2019    Therapeutic Level Labs: No results found for: LITHIUM No results found for: VALPROATE No components found for:  CBMZ  Current Medications: Current Outpatient Medications  Medication Sig Dispense Refill   Brexpiprazole 3 MG TABS TAKE 1 TABLET (3 MG TOTAL) BY MOUTH AT BEDTIME. FILL 4/2 90 tablet 0   buprenorphine (SUBUTEX) 2 MG SUBL SL tablet Place 1 tablet (2 mg total) under the tongue daily. 28 tablet 2   Buprenorphine HCl 600 MCG FILM PLACE 1 FILM IN THE CHEEK TWICE A DAY 60 each 2   Buprenorphine HCl 600 MCG FILM PLACE 1 FILM IN THE CHEEK TWICE A DAY 60 each 0   eszopiclone (LUNESTA) 2 MG TABS tablet Take 1 tablet (2 mg total) by mouth at bedtime as needed for sleep. 30 tablet 1   eszopiclone (LUNESTA) 2 MG TABS tablet Take 1 tablet (2 mg total) by mouth at bedtime as needed for sleep. Take immediately before bedtime 30 tablet 0   fluocinonide  ointment (LIDEX) 0.05 % Apply twice daily to back for 2 weeks, then daily for 2 weeks, then every other day for 2 weeks 60 g 3   gentamicin ointment (GARAMYCIN) 0.1 % APPLY TO AFFECTED TOENAILS DAILY FOR 3 WEEKS, THEN EVERY OTHER DAY FOR 3 WEEKS. 15 g 1   gentamicin ointment (GARAMYCIN) 0.1 % Apply to affected toenails daily for 3 weeks, then every other day for 3 weeks. 15 g 1   ibuprofen (ADVIL) 800 MG tablet TAKE ONE TABLET BY MOUTH AT ONSET OF  SEVERE HEADACHE WITH IMITREX TABLET. MAY REPEAT ONCE AFTER 8 HOURS FOR PERSISTENT HEADACHE 30 tablet 0   ketoconazole (NIZORAL) 2 % shampoo APPLY TO FACE TWICE WEEKLY. APPLY TO BACK DAILY FOR 1 WEEK, THEN TWICE WEEKLY. 120 mL 11   ketoconazole (NIZORAL) 2 % shampoo Apply to face twice weekly and back daily x 1 wk, then twice weekly. 120 mL 11   loratadine (CLARITIN) 10 MG tablet Take 1 tablet (10 mg total) by mouth daily. 90 tablet 3   mometasone (NASONEX) 50 MCG/ACT nasal spray Place 2 sprays into the nose daily. 51 g 1   sodium chloride (OCEAN) 0.65 % nasal spray PLACE 1 SPRAY INTO THE NOSE AS NEEDED FOR CONGESTION. 30 mL 12   SUMAtriptan (IMITREX) 100 MG tablet TAKE 1 TABLET BY MOUTH AT HEADACHE ONSET, FOR SEVERE HEADACHE. MAY REPEAT ONCE ONLY IN 24 HOURS, 2 HOURS AFTER 1ST TABLET. MAX USE=TWICE WEEKLY 10 tablet 2   topiramate (TOPAMAX) 100 MG tablet TAKE 1 TABLET (100 MG TOTAL) BY MOUTH 2 (TWO) TIMES DAILY. 60 tablet 2   traMADol (ULTRAM) 50 MG tablet Take 50 mg by mouth 2 (two) times daily.     traMADol (ULTRAM) 50 MG tablet TAKE 2 TABLETS BY MOUTH EVERY 6 HOURS AS NEEDED. 180 tablet 2   traMADol (ULTRAM) 50 MG tablet Take 2 tablets (100 mg total) by mouth every 6 (six) hours as needed. 180 tablet 0   traMADol (ULTRAM) 50 MG tablet Take 2 tablets (100 mg total) by mouth every 6 (six) hours as needed. 180 tablet 2   tretinoin (RETIN-A) 0.025 % cream Apply 1 application topically at bedtime.     venlafaxine XR (EFFEXOR-XR) 150 MG 24 hr capsule Take 1  capsule by mouth daily as directed with 75mg  capsule for total of 225mg  daily . 90 capsule 1   venlafaxine XR (EFFEXOR-XR) 75 MG 24 hr capsule TAKE 1 CAPSULE BY MOUTH ALONG WITH 1 OF THE 150MG  CAPSULES TO TOTAL 225MG  DAILY 90 capsule 1   No current facility-administered medications for this visit.     Musculoskeletal: Strength & Muscle Tone:  N/A Gait & Station:  N/A Patient leans: N/A  Psychiatric Specialty Exam: Review of Systems  Psychiatric/Behavioral:  Positive for decreased concentration, dysphoric mood and sleep disturbance. Negative for agitation, behavioral problems, confusion, hallucinations, self-injury and suicidal ideas. The patient is nervous/anxious. The patient is not hyperactive.   All other systems reviewed and are negative.  There were no vitals taken for this visit.There is no height or weight on file to calculate BMI.  General Appearance: Fairly Groomed  Eye Contact:  Good  Speech:  Clear and Coherent  Volume:  Normal  Mood:  Depressed  Affect:  Appropriate, Congruent, and down  Thought Process:  Coherent  Orientation:  Full (Time, Place, and Person)  Thought Content: Logical   Suicidal Thoughts:  No  Homicidal Thoughts:  No  Memory:  Immediate;   Good  Judgement:  Good  Insight:  Fair  Psychomotor Activity:  Normal  Concentration:  Concentration: Poor and Attention Span: Poor  Recall:  Good  Fund of Knowledge: Good  Language: Good  Akathisia:  No  Handed:  Right  AIMS (if indicated): not done  Assets:  Communication Skills Desire for Improvement  ADL's:  Intact  Cognition: WNL  Sleep:  Poor   Screenings: GAD-7    Flowsheet Row Video Visit from 07/11/2020 in [facility] Visit from 12/29/2018 in [facility] Office Visit from  10/28/2018 in [facility] Office Visit from 01/28/2018 in [facility] Phone Follow Up from 01/08/2018 in [facility]  Total GAD-7 Score 15 21 20 15 10        PHQ2-9    Flowsheet Row Counselor [facility]/03[facility]2020 [facility] Office Visit from 09/18/2020 in [facility] Video Visit from 08/23/2020 in [facility] Counselor from 08/17/2020 in [facility] Video Visit from 07/24/2020 in [facility]  PHQ-2 Total Score 4 6 3 3 4   PHQ-9 Total Score 16 17 13 15 7       Flowsheet Row Counselor [facility] 11/03/2020 [facility] Video Visit from 10/26/2020 in [facility] Video Visit from 09/20/2020 in [facility]  C-SSRS RISK CATEGORY No Risk No Risk No Risk        Assessment and Plan:  Riel Hirschman is a 45 y.o. year old male with a history of  depression, bilateral carpel tunnel syndrome, who presents for follow up appointment for below.     1. MDD (major depressive disorder), recurrent episode, moderate (HCC) Exam is notable for worsening in concentration despite his effort, and he reports slight worsening in depressive symptoms in the context of recurrence of migraine, and pain secondary to carpal tunnel syndrome.  Other psychosocial stressors includes  loss of his maternal aunt, and unemployment.  Given his recent worsening in mood is more attributable to pain, he agrees to stay on the current medication regimen of venlafaxine and rexulti to target depression while ensuring his medication adherence.  Coached behavioral activation.  He will continue to see Ms. Bynum for therapy.   2. Insomnia, unspecified type He has worsening insomnia, which he mainly attributes to pain.  Will continue current dose of Lunesta to target insomnia.   This clinician has discussed the side effect associated with medication prescribed during this encounter. Please refer to notes in the previous encounters for more details.        Plan I have reviewed and updated plans as below  1. Continue venlafaxine 225 mg (150 mg + 75 mg) daily 2. Continue Rexulti 3 mg at night - monitor drowsiness  3. Continue Lunesta 2 mg at night as needed for sleep  4. Next appointment- 8/23 at 10:30 AM for Gabriel [name]. [email or web address]. He declined in person visit at this time as he would not be able to drive long distance - on tramadol , [place] 600 Mcg Film  - on topiramate for migraine  Past trials of medication: sertraline, fluoxetine, mirtazapine, nortriptyline for carpel tunnel syndrome, bupropion (drowsiness), quetiapine (drowsiness), Abilify ("off balance"), Trazodone, Ambien (limited benefit), lorazepam (worsening in anxiety, drowsiness) , temazepam   The patient demonstrates the following risk factors for suicide: Chronic risk factors for suicide include: psychiatric disorder of depression. Acute risk factors for suicide include: unemployment and loss (financial, interpersonal, professional). Protective factors for this patient include: positive social support, responsibility to others (children, family) and hope for the future. Considering these factors, the overall suicide risk at this point appears to be low. Patient is appropriate for outpatient follow up.              54, MD 12/07/2020, 9:53 AM

## 2020-12-07 ENCOUNTER — Other Ambulatory Visit (HOSPITAL_COMMUNITY): Payer: Self-pay

## 2020-12-07 ENCOUNTER — Other Ambulatory Visit: Payer: Self-pay

## 2020-12-07 ENCOUNTER — Telehealth (INDEPENDENT_AMBULATORY_CARE_PROVIDER_SITE_OTHER): Payer: 59 | Admitting: Psychiatry

## 2020-12-07 ENCOUNTER — Encounter: Payer: Self-pay | Admitting: Psychiatry

## 2020-12-07 ENCOUNTER — Telehealth: Payer: Self-pay

## 2020-12-07 DIAGNOSIS — G4700 Insomnia, unspecified: Secondary | ICD-10-CM

## 2020-12-07 DIAGNOSIS — F331 Major depressive disorder, recurrent, moderate: Secondary | ICD-10-CM

## 2020-12-07 MED ORDER — ESZOPICLONE 2 MG PO TABS
2.0000 mg | ORAL_TABLET | Freq: Every evening | ORAL | 0 refills | Status: DC | PRN
  Filled 2020-12-07: qty 30, 30d supply, fill #0

## 2020-12-07 NOTE — Telephone Encounter (Signed)
Could you notify her- it is good as long as he takes medication regularly. I was talking with him based on the conversation with the pharmacy at his previous visit. Let me know if she has any other concern.

## 2020-12-07 NOTE — Telephone Encounter (Signed)
pt wife called states you just had a visit with him and she said you told him that he hasnt been pickin up his medications she states that he is picking up his medication and that you must be looking at someone elses chart .  She like for you to call her back.  She doesn't want miss information put in his chart.

## 2020-12-07 NOTE — Patient Instructions (Signed)
1. Continue venlafaxine 225 mg (150 mg + 75 mg) daily 2. Continue Rexulti 3 mg at night  3. Continue Lunesta 2 mg at night as needed for sleep  4. Next appointment- 8/23 at 10:30 AM

## 2020-12-08 ENCOUNTER — Other Ambulatory Visit (HOSPITAL_COMMUNITY): Payer: Self-pay

## 2020-12-08 MED FILL — Topiramate Tab 100 MG: ORAL | 13 days supply | Qty: 26 | Fill #1 | Status: AC

## 2020-12-08 MED FILL — Topiramate Tab 100 MG: ORAL | 17 days supply | Qty: 34 | Fill #1 | Status: AC

## 2020-12-15 ENCOUNTER — Ambulatory Visit (HOSPITAL_COMMUNITY): Payer: 59 | Admitting: Psychiatry

## 2020-12-15 ENCOUNTER — Other Ambulatory Visit: Payer: Self-pay

## 2020-12-22 ENCOUNTER — Other Ambulatory Visit: Payer: Self-pay | Admitting: Psychiatry

## 2020-12-22 ENCOUNTER — Other Ambulatory Visit (HOSPITAL_COMMUNITY): Payer: Self-pay

## 2020-12-25 ENCOUNTER — Other Ambulatory Visit (HOSPITAL_COMMUNITY): Payer: Self-pay

## 2020-12-26 ENCOUNTER — Other Ambulatory Visit (HOSPITAL_COMMUNITY): Payer: Self-pay

## 2020-12-26 NOTE — Telephone Encounter (Signed)
There is another refill order to be used

## 2020-12-29 ENCOUNTER — Other Ambulatory Visit: Payer: Self-pay

## 2020-12-29 ENCOUNTER — Ambulatory Visit (INDEPENDENT_AMBULATORY_CARE_PROVIDER_SITE_OTHER): Payer: 59 | Admitting: Psychiatry

## 2020-12-29 DIAGNOSIS — F331 Major depressive disorder, recurrent, moderate: Secondary | ICD-10-CM | POA: Diagnosis not present

## 2020-12-29 NOTE — Progress Notes (Signed)
Virtual Visit via Video Note  I connected with Gabriel [name] on 12/29/20 at 9:15 AM EDT  by a video enabled telemedicine application and verified that I am speaking with the correct person using two identifiers.  Location: Patient: Home Provider:  [facility] [place] office    I discussed the limitations of evaluation and management by telemedicine and the availability of in person appointments. The patient expressed understanding and agreed to proceed.   I provided  45 minutes of non-face-to-face time during this encounter.   [name], LCSW   THERAPIST PROGRESS NOTE    Session Time:  Friday 12/28/2020 9:12 AM -9:57 AM   Participation Level: Active  Behavioral Response: AlertDepressed/rocking frequently  Type of Therapy: Individual Therapy  Treatment Goals addressed:  alleviate depressive symptoms and resume normal involvement and activity and social activity, learn and implement cognitive and behavioral strategies to overcome depression,    Interventions: CBT and Supportive  Summary: Gabriel [name] is a 45 y.o. male who is referred for services by PCP Dr. Syliva Overman due to patient experiencing symptoms of depression. He denies any psychiatric hospitalizations, He saw therapist Dorann Lodge in [place] for a few months. Patient reports symptoms began when he lost his job about a year ago. Per his report, he was fired after he complained about race discrimination. 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 He states now feeling as though he failed his family. He states not wanting to be around anyone and having difficulty engaging with his wife and children. He fears losing his family.  He reports ruminating thoughts about job and the past, excessive worry depressed mood, tearfulness, anxiety, isolative behaviors, poor motivation, poor  concentration, irritability, sleep difficulty, thoughts and feelings of hopelessness and worthlessness. He denies any suicidal ideations.  Patient last was seen via virtual visit about 4 weeks ago. He continues to experience  moderate symptoms of depression including fatigue, loss of appetite, poor concentration, and memory difficulty.  However, he reports decreased anxiety and ruminating thoughts.  Per patient's report, he has been practicing deep breathing several times a day.  He states this has helped a lot.  He reports still going outside walking with his wife at times.  He reports staying out of his bedroom more by going into the den and sitting in the recliner.  He continues to have negative thoughts about self, ruminating thoughts about the past.  He also continues to experience frustration regarding chronic pain related to carpal tunnel and neuropathy.   SI/HI .Nowithout intent/plan  Therapist Response:  reviewed symptoms, praised and reinforced patient's use of deep breathing, discussed effects, developed plan with patient to continue practicing deep breathing daily, reviewed treatment plan, obtained patient's permission to initial treatment plan review as this was a virtual visit, began to assist patient identify connections between thoughts/mood/behaviors   Plan: Return again in 1-2  weeks  Diagnosis: Axis I: MDD, single episode, severe    Axis II: No diagnosis    [name], LCSW 12/29/2020

## 2021-01-02 NOTE — Progress Notes (Deleted)
BH MD/PA/NP OP Progress Note  01/02/2021 8:45 AM Gabriel [name]  MRN:  [id number]  Chief Complaint:  HPI: *** Visit Diagnosis: No diagnosis found.  Past Psychiatric History: Please see initial evaluation for full details. I have reviewed the history. No updates at this time.    Past Medical History:  Past Medical History:  Diagnosis Date   Anxiety    Carpal tunnel syndrome 2018   Depression    Depression    Phreesia 05/15/2020   Elevated LFTs    Heart murmur    Insomnia    Seasonal allergies    Vitamin D deficiency     Past Surgical History:  Procedure Laterality Date   NO PAST SURGERIES      Family Psychiatric History: Please see initial evaluation for full details. I have reviewed the history. No updates at this time.     Family History:  Family History  Problem Relation Age of Onset   Diabetes Father    Hypertension Father    Hypertension Mother    Hypertension Maternal Grandfather    Diabetes Maternal Grandfather    Cancer Paternal Grandmother        type unknown   Hypertension Paternal Grandmother    Cancer Paternal Grandfather        type unknown   Hypertension Paternal Grandfather     Social History:  Social History   Socioeconomic History   Marital status: Married    Spouse name: Not on file   Number of children: 2   Years of education: 12   Highest education level: High school graduate  Occupational History   Occupation: not employed  Tobacco Use   Smoking status: Never   Smokeless tobacco: Never  Vaping Use   Vaping Use: Never used  Substance and Sexual Activity   Alcohol use: No    Alcohol/week: 0.0 standard drinks   Drug use: No   Sexual activity: Yes    Birth control/protection: None  Other Topics Concern   Not on file  Social History Narrative   He lives with wife and two children.   Unemployed at this present time   Right handed   One story home   Highest level of education:  12th grade   Social Determinants of Health    Financial Resource Strain: Not on file  Food Insecurity: Not on file  Transportation Needs: Not on file  Physical Activity: Not on file  Stress: Not on file  Social Connections: Not on file    Allergies: No Known Allergies  Metabolic Disorder Labs: Lab Results  Component Value Date   HGBA1C 6.0 (A) 01/31/2020   MPG 137 05/27/2019   MPG 137 12/29/2018   No results found for: PROLACTIN Lab Results  Component Value Date   CHOL 171 07/20/2020   TRIG 54 07/20/2020   HDL 45 07/20/2020   CHOLHDL 3.8 07/20/2020   VLDL 10 12/04/2016   LDLCALC 115 (H) 07/20/2020   LDLCALC 131 (H) 05/27/2019   Lab Results  Component Value Date   TSH 2.450 07/20/2020   TSH 1.93 06/08/2019    Therapeutic Level Labs: No results found for: LITHIUM No results found for: VALPROATE No components found for:  CBMZ  Current Medications: Current Outpatient Medications  Medication Sig Dispense Refill   Brexpiprazole 3 MG TABS Take 1 tablet (3 mg total) by mouth at bedtime. 90 tablet 0   buprenorphine (SUBUTEX) 2 MG SUBL SL tablet Place 1 tablet (2 mg total) under the tongue daily.  28 tablet 2   Buprenorphine HCl 600 MCG FILM PLACE 1 FILM IN THE CHEEK TWICE A DAY 60 each 2   eszopiclone (LUNESTA) 2 MG TABS tablet Take 1 tablet (2 mg total) by mouth at bedtime as needed for sleep. 30 tablet 1   eszopiclone (LUNESTA) 2 MG TABS tablet Take 1 tablet (2 mg total) by mouth at bedtime as needed for sleep. Take immediately before bedtime 30 tablet 0   fluocinonide ointment (LIDEX) 0.05 % Apply twice daily to back for 2 weeks, then daily for 2 weeks, then every other day for 2 weeks 60 g 3   gentamicin ointment (GARAMYCIN) 0.1 % APPLY TO AFFECTED TOENAILS DAILY FOR 3 WEEKS, THEN EVERY OTHER DAY FOR 3 WEEKS. 15 g 1   gentamicin ointment (GARAMYCIN) 0.1 % Apply to affected toenails daily for 3 weeks, then every other day for 3 weeks. 15 g 1   ibuprofen (ADVIL) 800 MG tablet TAKE ONE TABLET BY MOUTH AT ONSET OF  SEVERE HEADACHE WITH IMITREX TABLET. MAY REPEAT ONCE AFTER 8 HOURS FOR PERSISTENT HEADACHE 30 tablet 0   ketoconazole (NIZORAL) 2 % shampoo APPLY TO FACE TWICE WEEKLY. APPLY TO BACK DAILY FOR 1 WEEK, THEN TWICE WEEKLY. 120 mL 11   ketoconazole (NIZORAL) 2 % shampoo Apply to face twice weekly and back daily x 1 wk, then twice weekly. 120 mL 11   loratadine (CLARITIN) 10 MG tablet Take 1 tablet (10 mg total) by mouth daily. 90 tablet 3   mometasone (NASONEX) 50 MCG/ACT nasal spray Place 2 sprays into the nose daily. 51 g 1   sodium chloride (OCEAN) 0.65 % nasal spray PLACE 1 SPRAY INTO THE NOSE AS NEEDED FOR CONGESTION. 30 mL 12   SUMAtriptan (IMITREX) 100 MG tablet TAKE 1 TABLET BY MOUTH AT HEADACHE ONSET, FOR SEVERE HEADACHE. MAY REPEAT ONCE ONLY IN 24 HOURS, 2 HOURS AFTER 1ST TABLET. MAX USE=TWICE WEEKLY 10 tablet 2   topiramate (TOPAMAX) 100 MG tablet TAKE 1 TABLET (100 MG TOTAL) BY MOUTH 2 (TWO) TIMES DAILY. 60 tablet 2   traMADol (ULTRAM) 50 MG tablet Take 50 mg by mouth 2 (two) times daily.     traMADol (ULTRAM) 50 MG tablet TAKE 2 TABLETS BY MOUTH EVERY 6 HOURS AS NEEDED. 180 tablet 2   traMADol (ULTRAM) 50 MG tablet Take 2 tablets (100 mg total) by mouth every 6 (six) hours as needed. 180 tablet 0   traMADol (ULTRAM) 50 MG tablet Take 2 tablets (100 mg total) by mouth every 6 (six) hours as needed. 180 tablet 2   tretinoin (RETIN-A) 0.025 % cream Apply 1 application topically at bedtime.     venlafaxine XR (EFFEXOR-XR) 150 MG 24 hr capsule Take 1 capsule by mouth daily as directed with 75mg  capsule for total of 225mg  daily . 90 capsule 1   venlafaxine XR (EFFEXOR-XR) 75 MG 24 hr capsule TAKE 1 CAPSULE BY MOUTH ALONG WITH 1 OF THE 150MG  CAPSULES TO TOTAL 225MG  DAILY 90 capsule 1   No current facility-administered medications for this visit.     Musculoskeletal: Strength & Muscle Tone:  N/A Gait & Station:  N/A Patient leans: N/A  Psychiatric Specialty Exam: Review of Systems  There  were no vitals taken for this visit.There is no height or weight on file to calculate BMI.  General Appearance: {Appearance:22683}  Eye Contact:  {[facility] EYE CONTACT:22684}  Speech:  Clear and Coherent  Volume:  Normal  Mood:  {[facility] MOOD:22306}  Affect:  {Affect (  ZOX):09604}PAA):22687}  Thought Process:  Coherent  Orientation:  Full (Time, Place, and Person)  Thought Content: Logical   Suicidal Thoughts:  {ST/HT (PAA):22692}  Homicidal Thoughts:  {ST/HT (PAA):22692}  Memory:  Immediate;   Good  Judgement:  {Judgement (PAA):22694}  Insight:  {Insight (PAA):22695}  Psychomotor Activity:  Normal  Concentration:  Concentration: Good and Attention Span: Good  Recall:  Good  Fund of Knowledge: Good  Language: Good  Akathisia:  No  Handed:  Right  AIMS (if indicated): not done  Assets:  Communication Skills Desire for Improvement  ADL's:  Intact  Cognition: WNL  Sleep:  {[facility] GOOD/FAIR/POOR:22877}   Screenings: GAD-7    Flowsheet Row Video Visit [facility]2020 [facility] Visit from 12/29/2018 in [facility] Visit from 10/28/2018 in [facility]2018 [facility]  Total GAD-7 Score 15 21 20 15 10       PHQ2-9    Flowsheet Row Counselor from [facility]/03[facility]2020 [facility] Visit from [facility]18/2020 in [facility] Video [facility]23/2020 [facility] Counselor from 08/17/2020 in [facility] [facility] Video Visit from 07/24/2020 in [facility]  PHQ-2 Total Score 4 6 3 3 4   PHQ-9 Total Score 16 17 13 15 7       Flowsheet Row Counselor from 11/03/2020 [facility] Video Visit from 10/26/2020 in [place] [facility] Video Visit from 09/20/2020 in  [facility]  C-SSRS RISK CATEGORY No Risk No Risk No Risk        Assessment and Plan:  Gabriel [name] is a 45 y.o. year old male with a history of depression, bilateral carpel tunnel syndrome, who presents for follow up appointment for below.       1. MDD (major depressive disorder), recurrent episode, moderate (HCC) Exam is notable for worsening in concentration despite his effort, and he reports slight worsening in depressive symptoms in the context of recurrence of migraine, and pain secondary to carpal tunnel syndrome.  Other psychosocial stressors includes  loss of his maternal aunt, and unemployment.  Given his recent worsening in mood is more attributable to pain, he agrees to stay on the current medication regimen of venlafaxine and rexulti to target depression while ensuring his medication adherence.  Coached behavioral activation.  He will continue to see Ms. Bynum for therapy.    2. Insomnia, unspecified type He has worsening insomnia, which he mainly attributes to pain.  Will continue current dose of Lunesta to target insomnia.    This clinician has discussed the side effect associated with medication prescribed during this encounter. Please refer to notes in the previous encounters for more details.       Plan I have reviewed and updated plans as below  1. Continue venlafaxine 225 mg (150 mg + 75 mg) daily 2. Continue Rexulti 3 mg at night - monitor drowsiness  3. Continue Lunesta 2 mg at night as needed for sleep  4. Next appointment- 8/23 at 10:30 AM for 30mins. [email or web address]. He declined in person visit at this time as he would not be able to drive long distance - on tramadol , [place] 600 Mcg Film  - on topiramate for migraine   Past trials of medication: sertraline, fluoxetine, mirtazapine, nortriptyline for carpel tunnel syndrome, bupropion (drowsiness), quetiapine (drowsiness), Abilify ("off balance"), Trazodone,  Ambien  (limited benefit), lorazepam (worsening in anxiety, drowsiness) , temazepam   The patient demonstrates the following risk factors for suicide: Chronic risk factors for suicide include: psychiatric disorder of depression. Acute risk factors for suicide include: unemployment and loss (financial, interpersonal, professional). Protective factors for this patient include: positive social support, responsibility to others (children, family) and hope for the future. Considering these factors, the overall suicide risk at this point appears to be low. Patient is appropriate for outpatient follow up.         [name], MD 01/02/2021, 8:45 AM

## 2021-01-03 ENCOUNTER — Other Ambulatory Visit: Payer: Self-pay

## 2021-01-03 ENCOUNTER — Telehealth: Payer: 59 | Admitting: Psychiatry

## 2021-01-12 ENCOUNTER — Ambulatory Visit (INDEPENDENT_AMBULATORY_CARE_PROVIDER_SITE_OTHER): Payer: 59 | Admitting: Psychiatry

## 2021-01-12 ENCOUNTER — Other Ambulatory Visit: Payer: Self-pay

## 2021-01-12 DIAGNOSIS — F331 Major depressive disorder, recurrent, moderate: Secondary | ICD-10-CM | POA: Diagnosis not present

## 2021-01-12 NOTE — Progress Notes (Signed)
Virtual Visit via Video Note  I connected with Gabriel [name] on 01/12/21 at 9:15 AM EDT by a video enabled telemedicine application and verified that I am speaking with the correct person using two identifiers.  Location: Patient: Home Provider:[facility] [place] office    I discussed the limitations of evaluation and management by telemedicine and the availability of in person appointments. The patient expressed understanding and agreed to proceed.  I provided 40 minutes of non-face-to-face time during this encounter.   THERAPIST PROGRESS NOTE    Session Time:  Friday 01/12/2021 9:15 AM -  9:55 AM   Participation Level: Active  Behavioral Response: AlertDepressed/rocking frequently  Type of Therapy: Individual Therapy  Treatment Goals addressed:  alleviate depressive symptoms and resume normal involvement and activity and social activity, learn and implement cognitive and behavioral strategies to overcome depression,    Interventions: CBT and Supportive  Summary: Gabriel [name] is a 45 y.o. male who is referred for services by PCP Dr. Syliva Overman due to patient experiencing symptoms of depression. He denies any psychiatric hospitalizations, He saw therapist Dorann Lodge in [place] for a few months. Patient reports symptoms began when he lost his job about a year ago. Per his report, he was fired after he complained about race discrimination. 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 He states now feeling as though he failed his family. He states not wanting to be around anyone and having difficulty engaging with his wife and children. He fears losing his family.  He reports ruminating thoughts about job and the past, excessive worry depressed mood, tearfulness, anxiety, isolative behaviors, poor motivation, poor concentration, irritability,  sleep difficulty, thoughts and feelings of hopelessness and worthlessness. He denies any suicidal ideations.  Patient last was seen via virtual visit about 2weeks ago. He continues to experience moderate symptoms of depression including fatigue, loss of appetite, poor concentration, and memory difficulty.  Hee continues to report decreased anxiety and ruminating thoughts.  Per patient's report, he continues to practice deep breathing several times a day and states this has been very helpful.  He also states he has experienced about 2 good days per week since last session.  Patient states feeling refreshed, not so beaten down, and not so tired on those days.  He reports spending more time out of his room and says he sits around the family more often.  He also reports engaging in conversation.  He states now feeling more relaxed when around family since he has been practicing the deep breathing.  He continues to experience chronic pain that limits some of his involvement in activity.  He reports walking with his wife about 2 to 3 days/week.  He also reports trying to sit down and have a meal with family but reports sitting for too long aggravates the pain.     SI/HI .Nowithout intent/plan  Therapist Response:  reviewed symptoms, praised and reinforced patient's use of deep breathing, discussed effects, assisted patient try to identify patterns on his good days.  Developed plan with patient to chart his good days and things that may have happened on those days, discussed rationale for and assisted patient practice mindfulness activity (leaves on a stream) as another tool in coping with ruminating thoughts, developed plan with patient to either practice deep breathing or the leaves on a stream exercise  daily between sessions    Plan: Return again in 1-2  weeks  Diagnosis: Axis I: MDD, single episode, severe    Axis II: No diagnosis    [name], LCSW 01/12/2021

## 2021-02-01 ENCOUNTER — Other Ambulatory Visit: Payer: Self-pay

## 2021-02-01 ENCOUNTER — Ambulatory Visit (INDEPENDENT_AMBULATORY_CARE_PROVIDER_SITE_OTHER): Payer: 59 | Admitting: Psychiatry

## 2021-02-01 DIAGNOSIS — F331 Major depressive disorder, recurrent, moderate: Secondary | ICD-10-CM | POA: Diagnosis not present

## 2021-02-01 NOTE — Progress Notes (Signed)
Virtual Visit via Video Note  I connected with Gabriel [name] on 02/01/21 at 9:08 AM EDT by a video enabled telemedicine application and verified that I am speaking with the correct person using two identifiers.  Location: Patient: Home Provider: [facility] [place] office    I discussed the limitations of evaluation and management by telemedicine and the availability of in person appointments. The patient expressed understanding and agreed to proceed.   I provided 45  minutes of non-face-to-face time during this encounter.   THERAPIST PROGRESS NOTE    Session Time:  Thursday 01/31/2021 9:08 AM  -  9:53 AM   Participation Level: Active  Behavioral Response: AlertDepressed/rocking frequently  Type of Therapy: Individual Therapy  Treatment Goals addressed:  alleviate depressive symptoms and resume normal involvement and activity and social activity, learn and implement cognitive and behavioral strategies to overcome depression,    Interventions: CBT and Supportive  Summary: Gabriel [name] is a 45 y.o. male who is referred for services by PCP Dr. Syliva Overman due to patient experiencing symptoms of depression. He denies any psychiatric hospitalizations, He saw therapist Dorann Lodge in [place] for a few months. Patient reports symptoms began when he lost his job about a year ago. Per his report, he was fired after he complained about race discrimination. 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 He states now feeling as though he failed his family. He states not wanting to be around anyone and having difficulty engaging with his wife and children. He fears losing his family.  He reports ruminating thoughts about job and the past, excessive worry depressed mood, tearfulness, anxiety, isolative behaviors, poor motivation, poor concentration,  irritability, sleep difficulty, thoughts and feelings of hopelessness and worthlessness. He denies any suicidal ideations.  Patient last was seen via virtual visit about 2 weeks ago. He continues to experience moderate symptoms of depression including fatigue, poor concentration, memory difficulty, and sleep difficulty however, he reports being less depressed and continuing to have some good days.  He also reports improved appetite.  Per his report, he has been more engaged with family and has been eating meals with them at least 3-4 times per week since last session.  He reports more engagement with family as he has gone from mainly responding to questions to actually initiating conversation and asking questions.  He is very pleased about this and reports feeling better after doing this.  He reports increased awareness of when he is drifting into a depressive mood and has made efforts to avoid this by using deep breathing and trying to think about something more pleasant.  He reports continued support/encouragement from wife and says they have been walking just about every other day since last session.  Patient reports most prevalent inhibitors of engagement with family now seems to be headaches and chronic pain.      SI/HI .Nowithout intent/plan  Therapist Response:  reviewed symptoms, praised and reinforced patient's use of deep breathing, discussed effects, praised and reinforced patient's increased engagement with family, discussed effects on his mood, reviewed rationale and addressed thoughts and processes that inhibited practicing the leaves on the stream exercise, practiced leaves on a stream exercise with patient in session, assisted patient began to explore ways to increase involvement in activities congruent with his values, developed plan with patient to write how he would  like to change his day in preparation for next session   Plan: Return again in 1-2  weeks  Diagnosis: Axis I: MDD, single  episode, severe    Axis II: No diagnosis    [name], LCSW 02/01/2021

## 2021-02-05 ENCOUNTER — Encounter: Payer: 59 | Admitting: Family Medicine

## 2021-02-06 ENCOUNTER — Ambulatory Visit: Payer: 59 | Admitting: Podiatry

## 2021-02-12 ENCOUNTER — Ambulatory Visit: Payer: 59 | Admitting: Podiatry

## 2021-02-15 ENCOUNTER — Other Ambulatory Visit: Payer: Self-pay

## 2021-02-15 ENCOUNTER — Ambulatory Visit (INDEPENDENT_AMBULATORY_CARE_PROVIDER_SITE_OTHER): Payer: 59 | Admitting: Psychiatry

## 2021-02-15 DIAGNOSIS — F331 Major depressive disorder, recurrent, moderate: Secondary | ICD-10-CM | POA: Diagnosis not present

## 2021-02-15 NOTE — Progress Notes (Signed)
Virtual Visit via Video Note  I connected with Gabriel [name] on 02/15/21 at 9:15 AM EDT by a video enabled telemedicine application and verified that I am speaking with the correct person using two identifiers.  Location: Patient: Home Provider: [facility] [place] office    I discussed the limitations of evaluation and management by telemedicine and the availability of in person appointments. The patient expressed understanding and agreed to proceed.   I provided 44 minutes of non-face-to-face time during this encounter.   [name], LCSW  THERAPIST PROGRESS NOTE    Session Time:  Thursday 02/15/2021 9:15 AM - 9:59 AM   Participation Level: Active  Behavioral Response: AlertDepressed/rocking frequently  Type of Therapy: Individual Therapy  Treatment Goals addressed:  alleviate depressive symptoms and resume normal involvement and activity and social activity, learn and implement cognitive and behavioral strategies to overcome depression,    Interventions: CBT and Supportive  Summary: Gabriel [name] is a 45 y.o. male who is referred for services by PCP Dr. Syliva Overman due to patient experiencing symptoms of depression. He denies any psychiatric hospitalizations, He saw therapist Dorann Lodge in [place] for a few months. Patient reports symptoms began when he lost his job about a year ago. Per his report, he was fired after he complained about race discrimination. 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 He states now feeling as though he failed his family. He states not wanting to be around anyone and having difficulty engaging with his wife and children. He fears losing his family.  He reports ruminating thoughts about job and the past, excessive worry depressed mood, tearfulness, anxiety, isolative behaviors, poor motivation, poor  concentration, irritability, sleep difficulty, thoughts and feelings of hopelessness and worthlessness. He denies any suicidal ideations.  Patient last was seen via virtual visit about 2 weeks ago. He continues to experience moderate symptoms of depression including fatigue, poor concentration, memory difficulty, and sleep difficulty. He continues to report being less depressed and continuing to have some good days.  He also reports improved appetite.  Per his report, he has increased engagement with family and has been eating meals with them at least 4-5 times per week since last session.  He continues to initiatiate conversation and ask questions.  He reports doing this even in pain. He continues to practice deep breathing and finds this very helpful. He also has continued to walk nearly every day with his wife.  He continues to report ruminating thoughts about the past. He has difficulty identifying other activities to pursue.     SI/HI .Nowithout intent/plan  Therapist Response:  reviewed symptoms, praised and reinforced patient's use of deep breathing, discussed effects, praised and reinforced patient's increased engagement with family, discussed effects on his mood, used ACT principles to assist patient identify how he has been relating to his thoughts and the effects on his behavior, assisted patient explore how he could relate to his thoughts differently and identify behaviors he could pursue consistent with his values, developed plan with patient to shower and dress 4 days/week instead of staying in pajamas    Plan: Return again in 1-2  weeks  Diagnosis: Axis I: MDD, single episode, severe    Axis II: No diagnosis    [name], LCSW 02/15/2021

## 2021-02-20 ENCOUNTER — Encounter: Payer: Self-pay | Admitting: Podiatry

## 2021-02-20 ENCOUNTER — Other Ambulatory Visit: Payer: Self-pay

## 2021-02-20 ENCOUNTER — Ambulatory Visit: Payer: 59 | Admitting: Podiatry

## 2021-02-20 DIAGNOSIS — L608 Other nail disorders: Secondary | ICD-10-CM

## 2021-02-20 DIAGNOSIS — B351 Tinea unguium: Secondary | ICD-10-CM

## 2021-02-20 DIAGNOSIS — L603 Nail dystrophy: Secondary | ICD-10-CM | POA: Diagnosis not present

## 2021-02-20 NOTE — Progress Notes (Signed)
Virtual Visit via Video Note  I connected with Gabriel [name] on 02/23/21 at 10:30 AM EDT by a video enabled telemedicine application and verified that I am speaking with the correct person using two identifiers.  Location: Patient: home Provider: office Persons participated in the visit- patient, provider    I discussed the limitations of evaluation and management by telemedicine and the availability of in person appointments. The patient expressed understanding and agreed to proceed.    I discussed the assessment and treatment plan with the patient. The patient was provided an opportunity to ask questions and all were answered. The patient agreed with the plan and demonstrated an understanding of the instructions.   The patient was advised to call back or seek an in-person evaluation if the symptoms worsen or if the condition fails to improve as anticipated.  I provided 15 minutes of non-face-to-face time during this encounter.   [name], MD    [facility] MD/PA/NP OP Progress Note  02/23/2021 11:02 AM Gabriel [name]  MRN:  [id number]  Chief Complaint:  Chief Complaint   Follow-up; Depression    HPI:  This is a follow-up appointment for depression.  He states that he is not doing good as he has frequent bowel movement past few days.  He agrees that he sees his provider for this condition if no improvement.  His mood has been not too bad.  He has been trying to take a walk every day.  Although he has not been able to do it every day, he does it 3-4 times per week.  His wife encourages him to do so.  He has been also trying to spend more time with his children.  Her times he wants to get away from others when he has worsening in headache.  Although he is unable to recollect what he is working on with [name], he has been trying to do breathing exercise every day.  He also wants to have more time with his children.  He is not interested in treatment other than medication, and he  feels comfortable to stay on the same medication regimen.  He has insomnia.  He feels depressed.  He denies change in appetite.  He has difficulty in concentration especially since COVID.  He feels fatigued.  He denies SI.  He takes medication regularly.    Wt Readings from Last 3 Encounters:  09/18/20 210 lb 12.8 oz (95.6 kg)  07/11/20 205 lb (93 kg)  01/31/20 205 lb (93 kg)     Daily routine: takes a walk with his wife a few times per week Employment: unemployed. 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 He was raised by his mother. He reports good relationship with her, who taught him "love and respect." He has fair relationship with his father  Visit Diagnosis:    ICD-10-CM   1. Insomnia, unspecified type  G47.00     2. Current moderate episode of major depressive disorder without prior episode (HCC)  F32.1 venlafaxine XR (EFFEXOR-XR) 150 MG 24 hr capsule    venlafaxine XR (EFFEXOR-XR) 75 MG 24 hr capsule    3. Anxiety  F41.9 venlafaxine XR (EFFEXOR-XR) 150 MG 24 hr capsule    venlafaxine XR (EFFEXOR-XR) 75 MG 24 hr capsule      Past Psychiatric History: Please see initial  evaluation for full details. I have reviewed the history. No updates at this time.     Past Medical History:  Past Medical History:  Diagnosis Date   Anxiety    Carpal tunnel syndrome 2018   Depression    Depression    Phreesia 05/15/2020   Elevated LFTs    Heart murmur    Insomnia    Seasonal allergies    Vitamin D deficiency     Past Surgical History:  Procedure Laterality Date   NO PAST SURGERIES      Family Psychiatric History: Please see initial evaluation for full details. I have reviewed the history. No updates at this time.     Family History:  Family History  Problem Relation Age of Onset   Diabetes  Father    Hypertension Father    Hypertension Mother    Hypertension Maternal Grandfather    Diabetes Maternal Grandfather    Cancer Paternal Grandmother        type unknown   Hypertension Paternal Grandmother    Cancer Paternal Grandfather        type unknown   Hypertension Paternal Grandfather     Social History:  Social History   Socioeconomic History   Marital status: Married    Spouse name: Not on file   Number of children: 2   Years of education: 12   Highest education level: High school graduate  Occupational History   Occupation: not employed  Tobacco Use   Smoking status: Never   Smokeless tobacco: Never  Vaping Use   Vaping Use: Never used  Substance and Sexual Activity   Alcohol use: No    Alcohol/week: 0.0 standard drinks   Drug use: No   Sexual activity: Yes    Birth control/protection: None  Other Topics Concern   Not on file  Social History Narrative   He lives with wife and two children.   Unemployed at this present time   Right handed   One story home   Highest level of education:  12th grade   Social Determinants of Health   Financial Resource Strain: Not on file  Food Insecurity: Not on file  Transportation Needs: Not on file  Physical Activity: Not on file  Stress: Not on file  Social Connections: Not on file    Allergies: No Known Allergies  Metabolic Disorder Labs: Lab Results  Component Value Date   HGBA1C 6.0 (A) 01/31/2020   MPG 137 05/27/2019   MPG 137 12/29/2018   No results found for: PROLACTIN Lab Results  Component Value Date   CHOL 171 07/20/2020   TRIG 54 07/20/2020   HDL 45 07/20/2020   CHOLHDL 3.8 07/20/2020   VLDL 10 12/04/2016   LDLCALC 115 (H) 07/20/2020   LDLCALC 131 (H) 05/27/2019   Lab Results  Component Value Date   TSH 2.450 07/20/2020   TSH 1.93 06/08/2019    Therapeutic Level Labs: No results found for: LITHIUM No results found for: VALPROATE No components found for:  CBMZ  Current  Medications: Current Outpatient Medications  Medication Sig Dispense Refill   [START ON 03/31/2021] Brexpiprazole 3 MG TABS Take 1 tablet (3 mg total) by mouth at bedtime. 90 tablet 0   buprenorphine (SUBUTEX) 2 MG SUBL SL tablet Place 1 tablet (2 mg total) under the tongue daily. 28 tablet 2   eszopiclone (LUNESTA) 2 MG TABS tablet Take 1 tablet (2 mg total) by mouth at bedtime as needed for sleep. 30 tablet 1  fluocinonide ointment (LIDEX) 0.05 % Apply twice daily to back for 2 weeks, then daily for 2 weeks, then every other day for 2 weeks 60 g 3   gentamicin ointment (GARAMYCIN) 0.1 % APPLY TO AFFECTED TOENAILS DAILY FOR 3 WEEKS, THEN EVERY OTHER DAY FOR 3 WEEKS. 15 g 1   gentamicin ointment (GARAMYCIN) 0.1 % Apply to affected toenails daily for 3 weeks, then every other day for 3 weeks. 15 g 1   ibuprofen (ADVIL) 800 MG tablet TAKE ONE TABLET BY MOUTH AT ONSET OF SEVERE HEADACHE WITH IMITREX TABLET. MAY REPEAT ONCE AFTER 8 HOURS FOR PERSISTENT HEADACHE 30 tablet 0   ketoconazole (NIZORAL) 2 % shampoo APPLY TO FACE TWICE WEEKLY. APPLY TO BACK DAILY FOR 1 WEEK, THEN TWICE WEEKLY. 120 mL 11   ketoconazole (NIZORAL) 2 % shampoo Apply to face twice weekly and back daily x 1 wk, then twice weekly. 120 mL 11   loratadine (CLARITIN) 10 MG tablet Take 1 tablet (10 mg total) by mouth daily. 90 tablet 3   mometasone (NASONEX) 50 MCG/ACT nasal spray Place 2 sprays into the nose daily. 51 g 1   sodium chloride (OCEAN) 0.65 % nasal spray PLACE 1 SPRAY INTO THE NOSE AS NEEDED FOR CONGESTION. 30 mL 12   SUMAtriptan (IMITREX) 100 MG tablet TAKE 1 TABLET BY MOUTH AT HEADACHE ONSET, FOR SEVERE HEADACHE. MAY REPEAT ONCE ONLY IN 24 HOURS, 2 HOURS AFTER 1ST TABLET. MAX USE=TWICE WEEKLY 10 tablet 2   topiramate (TOPAMAX) 100 MG tablet TAKE 1 TABLET (100 MG TOTAL) BY MOUTH 2 (TWO) TIMES DAILY. 60 tablet 2   traMADol (ULTRAM) 50 MG tablet Take 50 mg by mouth 2 (two) times daily.     traMADol (ULTRAM) 50 MG tablet  Take 2 tablets (100 mg total) by mouth every 6 (six) hours as needed. 180 tablet 0   traMADol (ULTRAM) 50 MG tablet Take 2 tablets (100 mg total) by mouth every 6 (six) hours as needed. 180 tablet 2   tretinoin (RETIN-A) 0.025 % cream Apply 1 application topically at bedtime.     [START ON 03/24/2021] venlafaxine XR (EFFEXOR-XR) 150 MG 24 hr capsule Take 1 capsule (150 mg total) by mouth daily. Total of 225 mg daily, take along with 75 mg cap 90 capsule 0   [START ON 03/24/2021] venlafaxine XR (EFFEXOR-XR) 75 MG 24 hr capsule Take 1 capsule (75 mg total) by mouth daily. Total of 225 mg daily, take along with 150 mg cap 90 capsule 0   No current facility-administered medications for this visit.     Musculoskeletal: Strength & Muscle Tone:  N/A Gait & Station:  N/A Patient leans: N/A  Psychiatric Specialty Exam: Review of Systems  Psychiatric/Behavioral:  Positive for decreased concentration, dysphoric mood and sleep disturbance. Negative for agitation, behavioral problems, confusion, hallucinations, self-injury and suicidal ideas. The patient is nervous/anxious. The patient is not hyperactive.   All other systems reviewed and are negative.  There were no vitals taken for this visit.There is no height or weight on file to calculate BMI.  General Appearance: Fairly Groomed  Eye Contact:  Good  Speech:  Clear and Coherent  Volume:  Normal  Mood:   not bad  Affect:  Appropriate, Congruent, and Restricted  Thought Process:  Coherent  Orientation:  Full (Time, Place, and Person)  Thought Content: Logical   Suicidal Thoughts:  No  Homicidal Thoughts:  No  Memory:  Immediate;   Good  Judgement:  Good  Insight:  Fair  Psychomotor Activity:  Normal  Concentration:  Concentration: Fair and Attention Span: Fair  Recall:  Good  Fund of Knowledge: Good  Language: Good  Akathisia:  No  Handed:  Right  AIMS (if indicated): not done  Assets:  Communication Skills Desire for Improvement   ADL's:  Intact  Cognition: WNL  Sleep:  Poor   Screenings: GAD-7    Flowsheet Row Video Visit from 07/11/2020 in [facility] Office Visit from 12/29/2018 in [facility] Office Visit from 10/28/2018 in [facility] Office Visit from 01/28/2018 in [facility] Phone Follow Up from 01/08/2018 in [facility]  Total GAD-7 Score 15 21 20 15 10       PHQ2-9    Flowsheet Row Counselor from 11/03/2020 [facility] Office Visit from 09/18/2020 in [facility] Video Visit from 08/23/2020 in [facility] Counselor from 08/17/2020 in [facility] Video Visit from 07/24/2020 in [facility]  PHQ-2 Total Score 4 6 3 3 4   PHQ-9 Total Score 16 17 13 15 7       Flowsheet Row Counselor from 11/03/2020 in [facility] Video Visit from 10/26/2020 in [facility] Video Visit from 09/20/2020 in [facility]  C-SSRS RISK CATEGORY No Risk No Risk No Risk        Assessment and Plan:  Gabriel [name] is a 45 y.o. year old male with a history of depression, bilateral carpel tunnel syndrome, who presents for follow up appointment for below.    1. Current moderate episode of major depressive disorder without prior episode (HCC) 2. Anxiety There has been more improvement in depressive symptoms and anxiety since the last visit.  Psychosocial stressors includes unemployment, loss of his maternal aunt, migraine, and pain secondary to carpal tunnel syndrome.  Will continue current dose of venlafaxine and rexulti to target depression.  Coached behavioral activation.  He will continue to see Ms. Bynum for therapy.   3. Insomnia, unspecified type He continues to have initial and middle insomnia.  He has no known  snoring. Will continue current dose of Lunesta to target insomnia.    This clinician has discussed the side effect associated with medication prescribed during this encounter. Please refer to notes in the previous encounters for more details.     Plan I have reviewed and updated plans as below  1. Continue venlafaxine 225 mg (150 mg + 75 mg) daily 2. Continue Rexulti 3 mg at night - monitor drowsiness  3. Continue Lunesta 2 mg at night as needed for sleep  4. Next appointment- 12/16 at 11:30 AM for Gabriel [name]. [email or web address]. He declined in person visit at this time as he would not be able to drive long distance - on tramadol , [place] 600 Mcg Film  - on topiramate for migraine   Past trials of medication: sertraline, fluoxetine, mirtazapine, nortriptyline for carpel tunnel syndrome, bupropion (drowsiness), quetiapine (drowsiness), Abilify ("off balance"), Trazodone, Ambien (limited benefit), lorazepam (worsening in anxiety, drowsiness) , temazepam   The patient demonstrates the following risk factors for suicide: Chronic risk factors for suicide include: psychiatric disorder of depression. Acute risk factors for suicide include: unemployment and loss (financial, interpersonal, professional). Protective factors for this patient include: positive social support, responsibility to others (children, family) and hope for the future. Considering these factors, the overall suicide risk at this point appears to be low. Patient is appropriate  for outpatient follow up.      [name], MD 02/23/2021, 11:02 AM

## 2021-02-23 ENCOUNTER — Other Ambulatory Visit: Payer: Self-pay

## 2021-02-23 ENCOUNTER — Telehealth (INDEPENDENT_AMBULATORY_CARE_PROVIDER_SITE_OTHER): Payer: 59 | Admitting: Psychiatry

## 2021-02-23 ENCOUNTER — Encounter: Payer: Self-pay | Admitting: Psychiatry

## 2021-02-23 ENCOUNTER — Other Ambulatory Visit (HOSPITAL_COMMUNITY): Payer: Self-pay

## 2021-02-23 DIAGNOSIS — G4700 Insomnia, unspecified: Secondary | ICD-10-CM

## 2021-02-23 DIAGNOSIS — F321 Major depressive disorder, single episode, moderate: Secondary | ICD-10-CM

## 2021-02-23 DIAGNOSIS — F419 Anxiety disorder, unspecified: Secondary | ICD-10-CM | POA: Diagnosis not present

## 2021-02-23 MED ORDER — VENLAFAXINE HCL ER 75 MG PO CP24
75.0000 mg | ORAL_CAPSULE | Freq: Every day | ORAL | 0 refills | Status: DC
  Filled 2021-02-23 – 2021-04-30 (×2): qty 90, 90d supply, fill #0

## 2021-02-23 MED ORDER — BREXPIPRAZOLE 3 MG PO TABS
3.0000 mg | ORAL_TABLET | Freq: Every day | ORAL | 0 refills | Status: AC
  Filled 2021-02-23 – 2021-04-30 (×2): qty 90, 90d supply, fill #0

## 2021-02-23 MED ORDER — VENLAFAXINE HCL ER 150 MG PO CP24
150.0000 mg | ORAL_CAPSULE | Freq: Every day | ORAL | 0 refills | Status: DC
  Filled 2021-02-23 – 2021-04-30 (×2): qty 90, 90d supply, fill #0

## 2021-02-23 NOTE — Patient Instructions (Signed)
1. Continue venlafaxine 225 mg (150 mg + 75 mg) daily 2. Continue Rexulti 3 mg at night  3. Continue Lunesta 2 mg at night as needed for sleep  4. Next appointment- 12/16 at 11:30

## 2021-02-24 NOTE — Progress Notes (Signed)
Subjective:   Patient ID: Gabriel [name], male   DOB: 45 y.o.   MRN: [id number]   HPI 45 year old male presents the office with concern for discoloration to his toenails mostly on his left and right first and second toe as well as left fifth toe.  He has noticed some dark discoloration as well as thickening of the nail.  Not causing any discomfort.  This is been getting worse over the last 1 year.  Denies any redness or any drainage.  Does have neuropathy in his right leg.  He has documented right lumbar radiculopathy.   Review of Systems  All other systems reviewed and are negative.  Past Medical History:  Diagnosis Date   Anxiety    Carpal tunnel syndrome 2018   Depression    Depression    Phreesia 05/15/2020   Elevated LFTs    Heart murmur    Insomnia    Seasonal allergies    Vitamin D deficiency     Past Surgical History:  Procedure Laterality Date   NO PAST SURGERIES       Current Outpatient Medications:    [START ON 03/24/2021] Brexpiprazole 3 MG TABS, Take 1 tablet (3 mg total) by mouth at bedtime., Disp: 90 tablet, Rfl: 0   buprenorphine (SUBUTEX) 2 MG SUBL SL tablet, Place 1 tablet (2 mg total) under the tongue daily., Disp: 28 tablet, Rfl: 2   eszopiclone (LUNESTA) 2 MG TABS tablet, Take 1 tablet (2 mg total) by mouth at bedtime as needed for sleep., Disp: 30 tablet, Rfl: 1   fluocinonide ointment (LIDEX) 0.05 %, Apply twice daily to back for 2 weeks, then daily for 2 weeks, then every other day for 2 weeks, Disp: 60 g, Rfl: 3   gentamicin ointment (GARAMYCIN) 0.1 %, APPLY TO AFFECTED TOENAILS DAILY FOR 3 WEEKS, THEN EVERY OTHER DAY FOR 3 WEEKS., Disp: 15 g, Rfl: 1   gentamicin ointment (GARAMYCIN) 0.1 %, Apply to affected toenails daily for 3 weeks, then every other day for 3 weeks., Disp: 15 g, Rfl: 1   ibuprofen (ADVIL) 800 MG tablet, TAKE ONE TABLET BY MOUTH AT ONSET OF SEVERE HEADACHE WITH IMITREX TABLET. MAY REPEAT ONCE AFTER 8 HOURS FOR PERSISTENT HEADACHE,  Disp: 30 tablet, Rfl: 0   ketoconazole (NIZORAL) 2 % shampoo, APPLY TO FACE TWICE WEEKLY. APPLY TO BACK DAILY FOR 1 WEEK, THEN TWICE WEEKLY., Disp: 120 mL, Rfl: 11   ketoconazole (NIZORAL) 2 % shampoo, Apply to face twice weekly and back daily x 1 wk, then twice weekly., Disp: 120 mL, Rfl: 11   loratadine (CLARITIN) 10 MG tablet, Take 1 tablet (10 mg total) by mouth daily., Disp: 90 tablet, Rfl: 3   mometasone (NASONEX) 50 MCG/ACT nasal spray, Place 2 sprays into the nose daily., Disp: 51 g, Rfl: 1   sodium chloride (OCEAN) 0.65 % nasal spray, PLACE 1 SPRAY INTO THE NOSE AS NEEDED FOR CONGESTION., Disp: 30 mL, Rfl: 12   SUMAtriptan (IMITREX) 100 MG tablet, TAKE 1 TABLET BY MOUTH AT HEADACHE ONSET, FOR SEVERE HEADACHE. MAY REPEAT ONCE ONLY IN 24 HOURS, 2 HOURS AFTER 1ST TABLET. MAX USE=TWICE WEEKLY, Disp: 10 tablet, Rfl: 2   topiramate (TOPAMAX) 100 MG tablet, TAKE 1 TABLET (100 MG TOTAL) BY MOUTH 2 (TWO) TIMES DAILY., Disp: 60 tablet, Rfl: 2   traMADol (ULTRAM) 50 MG tablet, Take 50 mg by mouth 2 (two) times daily., Disp: , Rfl:    traMADol (ULTRAM) 50 MG tablet, Take 2 tablets (100 mg  total) by mouth every 6 (six) hours as needed., Disp: 180 tablet, Rfl: 0   traMADol (ULTRAM) 50 MG tablet, Take 2 tablets (100 mg total) by mouth every 6 (six) hours as needed., Disp: 180 tablet, Rfl: 2   tretinoin (RETIN-A) 0.025 % cream, Apply 1 application topically at bedtime., Disp: , Rfl:    [START ON 03/17/2021] venlafaxine XR (EFFEXOR-XR) 150 MG 24 hr capsule, Take 1 capsule (150 mg total) by mouth daily. Total of 225 mg daily, take along with 75 mg cap, Disp: 90 capsule, Rfl: 0   [START ON 03/17/2021] venlafaxine XR (EFFEXOR-XR) 75 MG 24 hr capsule, Take 1 capsule (75 mg total) by mouth daily. Total of 225 mg daily, take along with 150 mg cap, Disp: 90 capsule, Rfl: 0  No Known Allergies       Objective:  Physical Exam  General: AAO x3, NAD  Dermatological: Bilateral first, second, left fifth digit  toenails are hypertrophic, dystrophic with darkened discoloration present.  There is no extension any hyperpigmentation surrounding skin upon debridement the nail there is no extension hyperpigmentation into the underlying nailbed.  There is no edema, erythema, drainage or pus or any obvious signs of infection present.  There is no open lesions.  Vascular: Dorsalis Pedis artery and Posterior Tibial artery pedal pulses are 2/4 bilateral with immedate capillary fill time. There is no pain with calf compression, swelling, warmth, erythema.   Neruologic: Grossly intact via light touch bilateral.   Musculoskeletal: No areas of tenderness bilaterally.  Muscular strength 5/5 in all groups tested bilateral.  Gait: Unassisted, Nonantalgic.       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  Sharply debrided the symptomatic nails with any complications of this for culture, pathology to [facility].  There is dark discoloration but I think this is likely benign pigment change.  We will await the results of the culture before proceeding with further treatment.  Vivi Barrack DPM

## 2021-03-01 ENCOUNTER — Other Ambulatory Visit: Payer: Self-pay

## 2021-03-01 ENCOUNTER — Ambulatory Visit (HOSPITAL_COMMUNITY): Payer: 59 | Admitting: Psychiatry

## 2021-03-01 ENCOUNTER — Telehealth (HOSPITAL_COMMUNITY): Payer: Self-pay | Admitting: Psychiatry

## 2021-03-01 NOTE — Telephone Encounter (Signed)
Therapist attempted to contact patient twice via text through caregility platform for scheduled appointment, no response.  Therapist called patient who indicated he is suffering from a severe headache and unable to complete appointment.  Therapist and patient agreed to reschedule.

## 2021-03-02 ENCOUNTER — Encounter: Payer: Self-pay | Admitting: Family Medicine

## 2021-03-02 ENCOUNTER — Other Ambulatory Visit (HOSPITAL_COMMUNITY): Payer: Self-pay

## 2021-03-02 ENCOUNTER — Other Ambulatory Visit: Payer: Self-pay

## 2021-03-02 ENCOUNTER — Ambulatory Visit (INDEPENDENT_AMBULATORY_CARE_PROVIDER_SITE_OTHER): Payer: 59 | Admitting: Family Medicine

## 2021-03-02 VITALS — BP 119/75 | HR 71 | Resp 17 | Ht 72.0 in | Wt 212.1 lb

## 2021-03-02 DIAGNOSIS — Z23 Encounter for immunization: Secondary | ICD-10-CM

## 2021-03-02 DIAGNOSIS — Z125 Encounter for screening for malignant neoplasm of prostate: Secondary | ICD-10-CM | POA: Diagnosis not present

## 2021-03-02 DIAGNOSIS — R7302 Impaired glucose tolerance (oral): Secondary | ICD-10-CM

## 2021-03-02 DIAGNOSIS — E785 Hyperlipidemia, unspecified: Secondary | ICD-10-CM | POA: Diagnosis not present

## 2021-03-02 DIAGNOSIS — Z0000 Encounter for general adult medical examination without abnormal findings: Secondary | ICD-10-CM

## 2021-03-02 MED ORDER — IBUPROFEN 800 MG PO TABS
ORAL_TABLET | ORAL | 0 refills | Status: AC
  Filled 2021-03-02: qty 30, 15d supply, fill #0

## 2021-03-02 MED ORDER — SUMATRIPTAN SUCCINATE 100 MG PO TABS
ORAL_TABLET | ORAL | 2 refills | Status: DC
  Filled 2021-03-02: qty 10, 35d supply, fill #0

## 2021-03-02 MED ORDER — TOPIRAMATE 100 MG PO TABS
100.0000 mg | ORAL_TABLET | Freq: Two times a day (BID) | ORAL | 1 refills | Status: DC
  Filled 2021-03-02: qty 180, 90d supply, fill #0
  Filled 2021-10-03 – 2021-11-19 (×2): qty 180, 90d supply, fill #1

## 2021-03-02 NOTE — Patient Instructions (Addendum)
F/U in 6 months, call if you need me before Hepatic panel and HBA1C  and PSA today Flu vaccine today  Nurse please refill immitrex and ibuprofen x 2 as written  Continue exercise commitment  Please think about new things you will commit to doing with your children on a regular basis  Thanks for choosing [facility], we consider it a privelige to serve you.

## 2021-03-02 NOTE — Progress Notes (Signed)
   Gabriel [name]     MRN: [id number]      DOB: 1975/10/01   HPI: Patient is in for annual physical exam. No other health concerns are expressed or addressed at the visit. . Immunization is reviewed , and  updated     PE; BP 119/75   Pulse 71   Resp 17   Ht 6' (1.829 m)   Wt 212 lb 1.3 oz (96.2 kg)   SpO2 96%   BMI 28.76 kg/m   Pleasant male, alert and oriented x 3, in no cardio-pulmonary distress. Afebrile. HEENT No facial trauma or asymetry. Sinuses non tender. EOMI External ears normal,  Neck: supple, no adenopathy,JVD or thyromegaly.No bruits.  Chest: Clear to ascultation bilaterally.No crackles or wheezes. Non tender to palpation  Cardiovascular system; Heart sounds normal,  S1 and  S2 ,no S3.  No murmur, or thrill. Apical beat not displaced Peripheral pulses normal.  Abdomen: Soft, non tender, no organomegaly or masses. No bruits. Bowel sounds normal. No guarding, tenderness or rebound.    Musculoskeletal exam: Full ROM of spine, hips , shoulders and knees. No deformity ,swelling or crepitus noted. No muscle wasting or atrophy.   Neurologic: Cranial nerves 2 to 12 intact. Power, tone ,sensation and reflexes normal throughout. No disturbance in gait. No tremor.  Skin: Intact, no ulceration, erythema , scaling or rash noted. Pigmentation normal throughout  Psych; Depressed  mood and affect. Judgement and concentration normal   Assessment & Plan:  Encounter for annual physical exam Annual exam as documented. Counseling done  re healthy lifestyle involving commitment to 150 minutes exercise per week, heart healthy diet, and attaining healthy weight.The importance of adequate sleep also discussed. Regular seat belt use and home safety, is also discussed. Changes in health habits are decided on by the patient with goals and time frames  set for achieving them. Immunization and cancer screening needs are specifically addressed at this visit.

## 2021-03-03 LAB — PSA: Prostate Specific Ag, Serum: 0.5 ng/mL (ref 0.0–4.0)

## 2021-03-03 LAB — HEMOGLOBIN A1C
Est. average glucose Bld gHb Est-mCnc: 126 mg/dL
Hgb A1c MFr Bld: 6 % — ABNORMAL HIGH (ref 4.8–5.6)

## 2021-03-03 LAB — HEPATIC FUNCTION PANEL
ALT: 61 IU/L — ABNORMAL HIGH (ref 0–44)
AST: 29 IU/L (ref 0–40)
Albumin: 4.6 g/dL (ref 4.0–5.0)
Alkaline Phosphatase: 114 IU/L (ref 44–121)
Bilirubin Total: 0.4 mg/dL (ref 0.0–1.2)
Bilirubin, Direct: 0.13 mg/dL (ref 0.00–0.40)
Total Protein: 7.1 g/dL (ref 6.0–8.5)

## 2021-03-04 ENCOUNTER — Encounter: Payer: Self-pay | Admitting: Family Medicine

## 2021-03-04 NOTE — Assessment & Plan Note (Signed)

## 2021-03-05 ENCOUNTER — Other Ambulatory Visit (HOSPITAL_COMMUNITY): Payer: Self-pay

## 2021-03-05 DIAGNOSIS — G5603 Carpal tunnel syndrome, bilateral upper limbs: Secondary | ICD-10-CM | POA: Diagnosis not present

## 2021-03-05 DIAGNOSIS — Z79891 Long term (current) use of opiate analgesic: Secondary | ICD-10-CM | POA: Diagnosis not present

## 2021-03-05 DIAGNOSIS — R253 Fasciculation: Secondary | ICD-10-CM | POA: Diagnosis not present

## 2021-03-05 DIAGNOSIS — M5416 Radiculopathy, lumbar region: Secondary | ICD-10-CM | POA: Diagnosis not present

## 2021-03-05 DIAGNOSIS — M792 Neuralgia and neuritis, unspecified: Secondary | ICD-10-CM | POA: Diagnosis not present

## 2021-03-05 DIAGNOSIS — E1142 Type 2 diabetes mellitus with diabetic polyneuropathy: Secondary | ICD-10-CM | POA: Diagnosis not present

## 2021-03-05 MED ORDER — TRAMADOL HCL 50 MG PO TABS
100.0000 mg | ORAL_TABLET | Freq: Four times a day (QID) | ORAL | 2 refills | Status: DC | PRN
  Filled 2021-03-05: qty 180, 23d supply, fill #0

## 2021-03-05 MED ORDER — BUPRENORPHINE HCL 2 MG SL SUBL
2.0000 mg | SUBLINGUAL_TABLET | Freq: Every day | SUBLINGUAL | 2 refills | Status: DC
  Filled 2021-03-05: qty 28, 28d supply, fill #0

## 2021-03-06 ENCOUNTER — Other Ambulatory Visit (HOSPITAL_COMMUNITY): Payer: Self-pay

## 2021-03-28 ENCOUNTER — Other Ambulatory Visit: Payer: Self-pay

## 2021-03-28 ENCOUNTER — Ambulatory Visit (INDEPENDENT_AMBULATORY_CARE_PROVIDER_SITE_OTHER): Payer: 59 | Admitting: Psychiatry

## 2021-03-28 DIAGNOSIS — F321 Major depressive disorder, single episode, moderate: Secondary | ICD-10-CM

## 2021-03-28 NOTE — Progress Notes (Signed)
Virtual Visit via Video Note  I connected with Gabriel [name] on 03/28/21 at  8:30 AM EST by a video enabled telemedicine application and verified that I am speaking with the correct person using two identifiers.  Location: Patient: Home Provider: [facility] Outpatient [place] office    I discussed the limitations of evaluation and management by telemedicine and the availability of in person appointments. The patient expressed understanding and agreed to proceed.   I provided 23 minutes of non-face-to-face time during this encounter.   [name], LCSW   THERAPIST PROGRESS NOTE    Session Time:  Wednesday 03/28/2021 8:30 AM - 8:55 PM   Participation Level: Active  Behavioral Response: AlertDepressed/rocking frequently  Type of Therapy: Individual Therapy  Treatment Goals addressed:  alleviate depressive symptoms and resume normal involvement and activity and social activity, learn and implement cognitive and behavioral strategies to overcome depression,    Interventions: CBT and Supportive  Summary: Gabriel [name] is a 45 y.o. male who is referred for services by PCP Dr. Syliva Overman due to patient experiencing symptoms of depression. He denies any psychiatric hospitalizations, He saw therapist Dorann Lodge in [place] for a few months. Patient reports symptoms began when he lost his job about a year ago. Per his report, he was fired after he complained about race discrimination. 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 He states now feeling as though he failed his family. He states not wanting to be around anyone and having difficulty engaging with his wife and children. He fears losing his family.  He reports ruminating thoughts about job and the past, excessive worry depressed mood, tearfulness, anxiety, isolative behaviors, poor motivation,  poor concentration, irritability, sleep difficulty, thoughts and feelings of hopelessness and worthlessness. He denies any suicidal ideations.  Patient last was seen via virtual visit about 6 weeks ago. He continues to experience moderate symptoms of depression including fatigue, poor concentration, memory difficulty, and sleep difficulty. He continues to report being less depressed and continuing to have some good days.  He rates his level of depression between 5 and 6 on a 10 point scale with 10 being severe depression.  He reports the main trigger of depressed mood now seems to be pain and headaches.  He reports continued increased engagement with his family including eating meals and staying around his family rather than isolating self in his room at least 4-5 times per week.  He also is pleased he recently watched a movie with his family. He states he felt connected and reports he has not felt that way in a long time.  He continues to use use deep breathing exercises regularly and states trying not to worry so much about things.  He continues to walk with encouragement from his wife.  He implemented plan of showering and dressing stating doing this 3 times per week.      SI/HI .Nowithout intent/plan  Therapist Response:  reviewed symptoms, praised and reinforced patient's increased engagement with family especially recent incident regarding watching movie, discussed effects, praised and reinforced patient's efforts to implement plan regarding showering and dressing, discussed effects, developed plan with patient to continue showering and dressing and increase frequency to 4 times per week, also assisted patient identify ways to compensate for memory issues regarding practice assignments from therapy, developed plan with patient to record  the dates he showers and dresses in the way he feels after he completes the task, encourage patient to continue doing breathing exercises   Plan: Return again in 1-2   weeks  Diagnosis: Axis I: MDD, single episode, severe    Axis II: No diagnosis    [name], LCSW 03/28/2021

## 2021-04-19 ENCOUNTER — Other Ambulatory Visit: Payer: Self-pay

## 2021-04-19 ENCOUNTER — Ambulatory Visit (INDEPENDENT_AMBULATORY_CARE_PROVIDER_SITE_OTHER): Payer: 59 | Admitting: Psychiatry

## 2021-04-19 DIAGNOSIS — F321 Major depressive disorder, single episode, moderate: Secondary | ICD-10-CM | POA: Diagnosis not present

## 2021-04-19 NOTE — Progress Notes (Signed)
Virtual Visit via Video Note  I connected with Gabriel [name] on 04/19/21 at 9:05 AM EST by a video enabled telemedicine application and verified that I am speaking with the correct person using two identifiers.  Location: Patient: Home Provider: [facility] outpatient [place] office    I discussed the limitations of evaluation and management by telemedicine and the availability of in person appointments. The patient expressed understanding and agreed to proceed.   I provided  47 minutes of non-face-to-face time during this encounter.   [name], LCSW   THERAPIST PROGRESS NOTE    Session Time:  Thursday 04/19/2021 9:05 AM - 9:52 AM   Participation Level: Active  Behavioral Response: AlertDepressed/rocking frequently  Type of Therapy: Individual Therapy  Treatment Goals addressed:  alleviate depressive symptoms and resume normal involvement and activity and social activity, learn and implement cognitive and behavioral strategies to overcome depression,    Interventions: CBT and Supportive  Summary: Gabriel [name] is a 45 y.o. male who is referred for services by PCP Dr. Syliva Overman due to patient experiencing symptoms of depression. He denies any psychiatric hospitalizations, He saw therapist Meredith Staggers S  wan in [place] for a few months. Patient reports symptoms began when he lost his job about a year ago. Per his report, he was fired after he complained about race discrimination. 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 He states now feeling as though he failed his family. He states not wanting to be around anyone and having difficulty engaging with his wife and children. He fears losing his family.  He reports ruminating thoughts about job and the past, excessive worry depressed mood, tearfulness, anxiety, isolative behaviors, poor motivation,  poor concentration, irritability, sleep difficulty, thoughts and feelings of hopelessness and worthlessness. He denies any suicidal ideations.  Patient last was seen via virtual visit about 3-4 weeks ago. He continues to experience moderate symptoms of depression including fatigue, poor concentration, memory difficulty, and sleep difficulty. He continues to report being less depressed and maintaining steady engagement with his family.  However, he reports he did not go with his family to his in-laws home for Thanksgiving as he just did not feel like being around people.  He implemented plan of showering and dressing 4 times per week.  Patient reports he felt better and like he accomplished something after he did this.  He reports he forgot to record the dates.  Patient's reports a pattern of beating up on self and states these thoughts have a hold on him.  He states feeling bad because he cannot do the things he used to do.  Per his report, he just feels better being by himself when he has these thoughts.     SI/HI .Nowithout intent/plan  Therapist Response:  reviewed symptoms, praised and reinforced patient's engagement with family, praised and reinforced patient's implementation of showering and dressing 4 times per week, assisted patient identify the effects of this on his thoughts and his mood, assisted patient identify thought patterns about self and and effects on his mood/behavior/interaction with family, used cognitive defusion technique (leaves on a stream )to assist patient unhook from thoughts and to observe thoughts with openness and curiosity, debriefed the exercise with patient, developed plan with patient to practice technique daily, developed plan with patient to continue showering and dressing 4 times per week  Plan: Return again in 1-2  weeks  Diagnosis: Axis I: MDD, single episode, severe    Axis II: No diagnosis    [name], LCSW 04/19/2021

## 2021-04-23 NOTE — Progress Notes (Signed)
Virtual Visit via Video Note  I connected with Gabriel [name] on 04/27/21 at 11:30 AM EST by a video enabled telemedicine application and verified that I am speaking with the correct person using two identifiers.  Location: Patient: home Provider: office Persons participated in the visit- patient, provider    I discussed the limitations of evaluation and management by telemedicine and the availability of in person appointments. The patient expressed understanding and agreed to proceed.    I discussed the assessment and treatment plan with the patient. The patient was provided an opportunity to ask questions and all were answered. The patient agreed with the plan and demonstrated an understanding of the instructions.   The patient was advised to call back or seek an in-person evaluation if the symptoms worsen or if the condition fails to improve as anticipated.  I provided 18 minutes of non-face-to-face time during this encounter.   [name], MD    [facility] MD/PA/NP OP Progress Note  04/27/2021 11:59 AM Gabriel [name]  MRN:  [id number]  Chief Complaint:  Chief Complaint   Follow-up; Depression    HPI:  This is a follow-up appointment for depression and insomnia.  He states that he has been doing fine.  He stated by himself on Thanksgiving.  His family went to his mother-in-law's place.  He tends to be isolated at times.  However, he is trying to be more engaging with his family.  Although he may not feel hungry at times, he tries to sit with his family and his children.  He tries to go to walk almost every day with his wife.  He does not spend much time with his children on weekend, stating that they usually play video games.  He continues to struggle with headache, and takes pain medication for his leg pain and carpal tunnel syndrome.  He continues to have middle insomnia.  He has depressive symptoms as in PHQ-9.  He denies SI.  He feels anxious and tense.  He is willing to try  mirtazapine at this time.    Daily routine: takes a walk with his wife a few times per week Employment: unemployed. 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 He was raised by his mother. He reports good relationship with her, who taught him "love and respect." He has fair relationship with his father  Visit Diagnosis:    ICD-10-CM   1. Current moderate episode of major depressive disorder without prior episode ([place])  F32.1     2. Anxiety  F41.9     3. Insomnia, unspecified type  G47.00       Past Psychiatric History: Please see initial evaluation for full details. I have reviewed the history. No updates at this time.    Past Medical History:  Past Medical History:  Diagnosis Date   Anxiety    Carpal tunnel syndrome 2018   Depression    Depression    Phreesia 05/15/2020   Elevated LFTs    Heart murmur    Insomnia    Seasonal allergies    Vitamin D deficiency     Past Surgical History:  Procedure Laterality Date   NO PAST SURGERIES      Family Psychiatric History: Please see initial evaluation for full details. I have reviewed the history. No updates at this time.  Family History:  Family History  Problem Relation Age of Onset   Diabetes Father    Hypertension Father    Hypertension Mother    Hypertension Maternal Grandfather    Diabetes Maternal Grandfather    Cancer Paternal Grandmother        type unknown   Hypertension Paternal Grandmother    Cancer Paternal Grandfather        type unknown   Hypertension Paternal Grandfather     Social History:  Social History   Socioeconomic History   Marital status: Married    Spouse name: Not on file   Number of children: 2   Years of education: 12   Highest education level: High school graduate  Occupational History   Occupation:  not employed  Tobacco Use   Smoking status: Never   Smokeless tobacco: Never  Vaping Use   Vaping Use: Never used  Substance and Sexual Activity   Alcohol use: No    Alcohol/week: 0.0 standard drinks   Drug use: No   Sexual activity: Yes    Birth control/protection: None  Other Topics Concern   Not on file  Social History Narrative   He lives with wife and two children.   Unemployed at this present time   Right handed   One story home   Highest level of education:  12th grade   Social Determinants of Health   Financial Resource Strain: Not on file  Food Insecurity: Not on file  Transportation Needs: Not on file  Physical Activity: Not on file  Stress: Not on file  Social Connections: Not on file    Allergies: No Known Allergies  Metabolic Disorder Labs: Lab Results  Component Value Date   HGBA1C 6.0 (H) 03/02/2021   MPG 137 05/27/2019   MPG 137 12/29/2018   No results found for: PROLACTIN Lab Results  Component Value Date   CHOL 171 07/20/2020   TRIG 54 07/20/2020   HDL 45 07/20/2020   CHOLHDL 3.8 07/20/2020   VLDL 10 12/04/2016   LDLCALC 115 (H) 07/20/2020   LDLCALC 131 (H) 05/27/2019   Lab Results  Component Value Date   TSH 2.450 07/20/2020   TSH 1.93 06/08/2019    Therapeutic Level Labs: No results found for: LITHIUM No results found for: VALPROATE No components found for:  CBMZ  Current Medications: Current Outpatient Medications  Medication Sig Dispense Refill   mirtazapine (REMERON) 7.5 MG tablet Take 1 tablet (7.5 mg total) by mouth at bedtime. 30 tablet 0   Brexpiprazole 3 MG TABS Take 1 tablet (3 mg total) by mouth at bedtime. 90 tablet 0   buprenorphine (SUBUTEX) 2 MG SUBL SL tablet Place 1 tablet (2 mg total) under the tongue daily. 28 tablet 2   buprenorphine (SUBUTEX) 2 MG SUBL SL tablet Place 1 tablet (2 mg total) under the tongue daily. 28 tablet 2   eszopiclone (LUNESTA) 2 MG TABS tablet Take 1 tablet (2 mg total) by mouth at  bedtime as needed for sleep. 30 tablet 1   fluocinonide ointment (LIDEX) 0.05 % Apply twice daily to back for 2 weeks, then daily for 2 weeks, then every other day for 2 weeks 60 g 3   gentamicin ointment (GARAMYCIN) 0.1 % Apply to affected toenails daily for 3 weeks, then every other day for 3 weeks. 15 g 1   ibuprofen (ADVIL) 800 MG tablet TAKE ONE TABLET BY MOUTH AT ONSET OF SEVERE HEADACHE WITH IMITREX TABLET. MAY REPEAT ONCE AFTER 8  HOURS FOR PERSISTENT HEADACHE 30 tablet 0   ketoconazole (NIZORAL) 2 % shampoo Apply to face twice weekly and back daily x 1 wk, then twice weekly. 120 mL 11   loratadine (CLARITIN) 10 MG tablet Take 1 tablet (10 mg total) by mouth daily. 90 tablet 3   mometasone (NASONEX) 50 MCG/ACT nasal spray Place 2 sprays into the nose daily. 51 g 1   sodium chloride (OCEAN) 0.65 % nasal spray PLACE 1 SPRAY INTO THE NOSE AS NEEDED FOR CONGESTION. 30 mL 12   SUMAtriptan (IMITREX) 100 MG tablet TAKE 1 TABLET BY MOUTH AT HEADACHE ONSET, FOR SEVERE HEADACHE. MAY REPEAT ONCE ONLY IN 24 HOURS, 2 HOURS AFTER 1ST TABLET. MAX USE=TWICE WEEKLY 10 tablet 2   topiramate (TOPAMAX) 100 MG tablet Take 1 tablet (100 mg total) by mouth 2 (two) times daily. 180 tablet 1   traMADol (ULTRAM) 50 MG tablet Take 50 mg by mouth 2 (two) times daily. (Patient not taking: Reported on 03/02/2021)     traMADol (ULTRAM) 50 MG tablet Take 2 tablets (100 mg total) by mouth every 6 (six) hours as needed. 180 tablet 0   traMADol (ULTRAM) 50 MG tablet Take 2 tablets (100 mg total) by mouth every 6 (six) hours as needed. (Patient not taking: Reported on 03/02/2021) 180 tablet 2   traMADol (ULTRAM) 50 MG tablet Take 2 tablets (100 mg total) by mouth every 6 (six) hours as needed. 180 tablet 2   tretinoin (RETIN-A) 0.025 % cream Apply 1 application topically at bedtime.     venlafaxine XR (EFFEXOR-XR) 150 MG 24 hr capsule Take 1 capsule (150 mg total) by mouth daily. Total of 225 mg daily, take along with 75 mg cap  90 capsule 0   venlafaxine XR (EFFEXOR-XR) 75 MG 24 hr capsule Take 1 capsule (75 mg total) by mouth daily. Total of 225 mg daily, take along with 150 mg cap 90 capsule 0   No current facility-administered medications for this visit.     Musculoskeletal: Strength & Muscle Tone:  N/A Gait & Station:  N/A Patient leans: N/A  Psychiatric Specialty Exam: Review of Systems  Psychiatric/Behavioral:  Positive for decreased concentration, dysphoric mood and sleep disturbance. Negative for agitation, behavioral problems, confusion, hallucinations, self-injury and suicidal ideas. The patient is nervous/anxious. The patient is not hyperactive.   All other systems reviewed and are negative.  There were no vitals taken for this visit.There is no height or weight on file to calculate BMI.  General Appearance: Fairly Groomed  Eye Contact:  Good  Speech:  Clear and Coherent  Volume:  Normal  Mood:  Depressed  Affect:  Appropriate, Congruent, and down, but less tense  Thought Process:  Coherent  Orientation:  Full (Time, Place, and Person)  Thought Content: Logical   Suicidal Thoughts:  No  Homicidal Thoughts:  No  Memory:  Immediate;   Good  Judgement:  Good  Insight:  Good  Psychomotor Activity:  Normal  Concentration:  Concentration: Fair and Attention Span: Fair  Recall:  Good  Fund of Knowledge: Good  Language: Good  Akathisia:  No  Handed:  Right  AIMS (if indicated): not done  Assets:  Communication Skills Desire for Improvement  ADL's:  Intact  Cognition: WNL  Sleep:  Poor   Screenings: GAD-7    Flowsheet Row Video Visit from 07/11/2020 in [facility] Office Visit from 12/29/2018 in [facility] Office Visit from 10/28/2018 in [facility] Office Visit from 01/28/2018  in [place] Phone Follow Up from 01/08/2018 in [facility]  Total GAD-7 Score 15 21 20 15 10       PHQ2-9    Flowsheet Row Video Visit from  04/27/2021 in [place] Office Visit from 03/02/2021 in [place] from 11/03/2020 in [place] Office Visit from 09/18/2020 in [facility] Video Visit from 08/23/2020 in [place]  PHQ-2 Total Score 2 3 4 6 3   PHQ-9 Total Score 11 11 16 17 13       Flowsheet Row Counselor from 11/03/2020 in [place] Video Visit from 10/26/2020 in [place] Video Visit from 09/20/2020 in [place] No Risk No Risk No Risk        Assessment and Plan:  Gabriel [name] is a 44 y.o. year old male with a history of depression, bilateral carpel tunnel syndrome, who presents for follow up appointment for below.   1. Current moderate episode of major depressive disorder without prior episode ([place]) 2. Anxiety There has been overall improvement in depressive symptoms and anxiety since her last visit. Psychosocial stressors includes unemployment, loss of his maternal aunt, migraine, and pain secondary to carpal tunnel syndrome.  Will add mirtazapine as adjunctive treatment for depression and also to target insomnia.  Discussed potential risk of increase in appetite and serotonin syndrome with concomitant use of venlafaxine and tramadol.  Will continue venlafaxine and rexulti to target depression.  Coached behavioral activation.  He will continue to see a therapist.   3. Insomnia, unspecified type He reports middle insomnia, although there has been improvement in initial insomnia since starting the Lunesta.  He has no known snoring.  Will continue current dose of Lunesta as needed to target insomnia.   Plan I have reviewed and updated plans as below  Continue venlafaxine 225 mg (150 mg + 75 mg) daily Start mirtazapine 7.5 mg at night  Continue Rexulti 3 mg  at night - monitor drowsiness  Continue Lunesta 2 mg at night as needed for sleep Next appointment: 1/16 at 8:30 for 30 mins, video [email or web address]. He declined in person visit at this time as he would not be able to drive long distance - on tramadol , [place] [address]  - on topiramate for migraine   Past trials of medication: sertraline, fluoxetine, mirtazapine, nortriptyline for carpel tunnel syndrome, bupropion (drowsiness), quetiapine (drowsiness), Abilify ("off balance"), Trazodone, Ambien (limited benefit), lorazepam (worsening in anxiety, drowsiness) , temazepam   The patient demonstrates the following risk factors for suicide: Chronic risk factors for suicide include: psychiatric disorder of depression. Acute risk factors for suicide include: unemployment and loss (financial, interpersonal, professional). Protective factors for this patient include: positive social support, responsibility to others (children, family) and hope for the future. Considering these factors, the overall suicide risk at this point appears to be low. Patient is appropriate for outpatient follow up.    [name], MD 04/27/2021, 11:59 AM

## 2021-04-27 ENCOUNTER — Other Ambulatory Visit (HOSPITAL_COMMUNITY): Payer: Self-pay

## 2021-04-27 ENCOUNTER — Encounter: Payer: Self-pay | Admitting: Psychiatry

## 2021-04-27 ENCOUNTER — Telehealth (INDEPENDENT_AMBULATORY_CARE_PROVIDER_SITE_OTHER): Payer: 59 | Admitting: Psychiatry

## 2021-04-27 ENCOUNTER — Other Ambulatory Visit: Payer: Self-pay

## 2021-04-27 DIAGNOSIS — F321 Major depressive disorder, single episode, moderate: Secondary | ICD-10-CM

## 2021-04-27 DIAGNOSIS — G4700 Insomnia, unspecified: Secondary | ICD-10-CM

## 2021-04-27 DIAGNOSIS — F419 Anxiety disorder, unspecified: Secondary | ICD-10-CM

## 2021-04-27 MED ORDER — MIRTAZAPINE 7.5 MG PO TABS
7.5000 mg | ORAL_TABLET | Freq: Every day | ORAL | 0 refills | Status: DC
  Filled 2021-04-27: qty 30, 30d supply, fill #0

## 2021-04-27 NOTE — Patient Instructions (Signed)
Continue venlafaxine 225 mg (150 mg + 75 mg) daily Start mirtazapine 7.5 mg at night  Continue Rexulti 3 mg at night  Continue Lunesta 2 mg at night as needed for sleep Next appointment: 1/16 at 8:30, video

## 2021-04-30 ENCOUNTER — Other Ambulatory Visit (HOSPITAL_COMMUNITY): Payer: Self-pay

## 2021-04-30 ENCOUNTER — Other Ambulatory Visit: Payer: Self-pay

## 2021-05-01 ENCOUNTER — Other Ambulatory Visit (HOSPITAL_COMMUNITY): Payer: Self-pay

## 2021-05-02 ENCOUNTER — Other Ambulatory Visit: Payer: Self-pay

## 2021-05-02 ENCOUNTER — Other Ambulatory Visit (HOSPITAL_COMMUNITY): Payer: Self-pay

## 2021-05-03 ENCOUNTER — Ambulatory Visit (INDEPENDENT_AMBULATORY_CARE_PROVIDER_SITE_OTHER): Payer: 59 | Admitting: Psychiatry

## 2021-05-03 ENCOUNTER — Other Ambulatory Visit (HOSPITAL_COMMUNITY): Payer: Self-pay

## 2021-05-03 ENCOUNTER — Other Ambulatory Visit: Payer: Self-pay

## 2021-05-03 DIAGNOSIS — F321 Major depressive disorder, single episode, moderate: Secondary | ICD-10-CM | POA: Diagnosis not present

## 2021-05-03 MED ORDER — HYDROCORTISONE 2.5 % EX CREA
1.0000 "application " | TOPICAL_CREAM | Freq: Two times a day (BID) | CUTANEOUS | 3 refills | Status: DC
  Filled 2021-05-03: qty 30, 21d supply, fill #0
  Filled 2021-05-22: qty 30, 30d supply, fill #0
  Filled 2022-02-23: qty 30, 30d supply, fill #1

## 2021-05-03 NOTE — Progress Notes (Signed)
Virtual Visit via Video Note  I connected with Gabriel [name] on 05/03/21 at 9:14 AM EST by a video enabled telemedicine application and verified that I am speaking with the correct person using two identifiers.  Location: Patient: Home Provider: [facility] Outpatient [place] office    I discussed the limitations of evaluation and management by telemedicine and the availability of in person appointments. The patient expressed understanding and agreed to proceed.   I provided 41 minutes of non-face-to-face time during this encounter.   [name], LCSW    THERAPIST PROGRESS NOTE    Session Time:  Thursday 05/03/2021 9:14 AM - 9:55 AM   Participation Level: Active  Behavioral Response: AlertDepressed/rocking frequently  Type of Therapy: Individual Therapy  Treatment Goals addressed:  alleviate depressive symptoms and resume normal involvement and activity and social activity, learn and implement cognitive and behavioral strategies to overcome depression,    Interventions: CBT and Supportive  Summary: Gabriel [name] is a 45 y.o. male who is referred for services by PCP Dr. Syliva Overman due to patient experiencing symptoms of depression. He denies any psychiatric hospitalizations, He saw therapist Meredith Staggers S  wan in [place] for a few months. Patient reports symptoms began when he lost his job about a year ago. Per his report, he was fired after he complained about race discrimination. 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 He states now feeling as though he failed his family. He states not wanting to be around anyone and having difficulty engaging with his wife and children. He fears losing his family.  He reports ruminating thoughts about job and the past, excessive worry depressed mood, tearfulness, anxiety, isolative behaviors, poor motivation,  poor concentration, irritability, sleep difficulty, thoughts and feelings of hopelessness and worthlessness. He denies any suicidal ideations.  Patient last was seen via virtual visit about 2-3 weeks ago. He continues to experience mild to moderate symptoms of depression including fatigue, poor concentration, memory difficulty, and sleep difficulty. He continues to report being less depressed and maintaining steady engagement with his family.  He has maintained positive self-care regarding hygiene and reports showering and dressing 4 times per week.  He reports practicing deep breathing and using leaves on a stream exercise daily.  Patient reports increased awareness of his thoughts and states he is getting hooked by his thoughts every now and then.  He expresses frustration negative thoughts return.  However he has continued to practice letting thoughts go and reports feeling relieved.  He reports this gives him the motivation to keep going.  Also has begun to reframe thoughts of not being able to do things he used to do with thoughts of focusing on the things he can do.   SI/HI .Nowithout intent/plan  Therapist Response:  reviewed symptoms, praised and reinforced patient's engagement with family, praised and reinforced patient's implementation of showering and dressing 4 times per week, assisted patient identify the effects of this on his thoughts and his mood, praised and reinforced patient's practice of deep breathing and leaves on a stream exercise, discussed effects on mood/thoughts/behavior, praised and reinforced patient's increased awareness of thoughts and efforts to reframe negative thoughts, discussed effects, reviewed treatment plan, encouraged patient to continue practicing relaxation techniques daily and strategies to cope with negative ruminating thoughts as well as pursue value congruent behaviors  Plan: Return again  in 1-2  weeks  Diagnosis: Axis I: MDD, single episode, severe    Axis II:  No diagnosis    [name], LCSW 05/03/2021

## 2021-05-04 ENCOUNTER — Other Ambulatory Visit (HOSPITAL_COMMUNITY): Payer: Self-pay

## 2021-05-11 ENCOUNTER — Other Ambulatory Visit (HOSPITAL_COMMUNITY): Payer: Self-pay

## 2021-05-22 ENCOUNTER — Other Ambulatory Visit (HOSPITAL_COMMUNITY): Payer: Self-pay

## 2021-05-25 ENCOUNTER — Other Ambulatory Visit (HOSPITAL_COMMUNITY): Payer: Self-pay

## 2021-05-28 NOTE — Progress Notes (Signed)
Virtual Visit via Video Note  I connected with Gabriel [name] on 05/29/21 at  9:00 AM EST by a video enabled telemedicine application and verified that I am speaking with the correct person using two identifiers.  Location: Patient: home Provider: office Persons participated in the visit- patient, provider    I discussed the limitations of evaluation and management by telemedicine and the availability of in person appointments. The patient expressed understanding and agreed to proceed.   I discussed the assessment and treatment plan with the patient. The patient was provided an opportunity to ask questions and all were answered. The patient agreed with the plan and demonstrated an understanding of the instructions.   The patient was advised to call back or seek an in-person evaluation if the symptoms worsen or if the condition fails to improve as anticipated.  I provided 17 minutes of non-face-to-face time during this encounter.   Gabriel Hotter, MD    [facility] MD/PA/NP OP Progress Note  05/29/2021 9:29 AM Gabriel [name]  MRN:  [id number]  Chief Complaint:  Chief Complaint   Follow-up; Depression    HPI:  This is a follow-up appointment for depression and anxiety.  He states that he has been doing good.  He had a good time on Christmas, although they did not do anything special.  His wife bought Christmas presents for his children.  It was good for him to see them happy.  He states that he has been having more time with his children.  He feels connection with them.  He is able to enjoy the time with him most of the time.  Although he does have moments of feeling down and fatigue, he has been trying to go outside at least every day.  His wife also helps him to push trying to taking a walk.  He continues to have middle insomnia.  He denies change in appetite.  He has difficulty in concentration.  He denies SI.  He feels anxious at times without any triggers.  He denies panic attacks.  Of  note, he grimaces his face during the visit.  He has occasional exacerbation of his back pain with new manager on his leg.  He states that he has been dealing with it for a while, and denies any change in latency.  He denies any side effect from mirtazapine.  He is willing to try a higher dose at this time.   Daily routine: takes a walk with his wife a few times per week Employment: unemployed. 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 He was raised by his mother. He reports good relationship with her, who taught him "love and respect." He has fair relationship with his father  Visit Diagnosis:    ICD-10-CM   1. Current moderate episode of major depressive disorder without prior episode (HCC)  F32.1     2. Anxiety  F41.9     3. Insomnia, unspecified type  G47.00       Past Psychiatric History: Please see initial evaluation for full details. I have reviewed the history. No updates at this time.     Past Medical History:  Past Medical History:  Diagnosis Date   Anxiety    Carpal tunnel syndrome 2018   Depression    Depression    Phreesia 05/15/2020  Elevated LFTs    Heart murmur    Insomnia    Seasonal allergies    Vitamin D deficiency     Past Surgical History:  Procedure Laterality Date   NO PAST SURGERIES      Family Psychiatric History: Please see initial evaluation for full details. I have reviewed the history. No updates at this time.     Family History:  Family History  Problem Relation Age of Onset   Diabetes Father    Hypertension Father    Hypertension Mother    Hypertension Maternal Grandfather    Diabetes Maternal Grandfather    Cancer Paternal Grandmother        type unknown   Hypertension Paternal Grandmother    Cancer Paternal Grandfather        type unknown    Hypertension Paternal Grandfather     Social History:  Social History   Socioeconomic History   Marital status: Married    Spouse name: Not on file   Number of children: 2   Years of education: 12   Highest education level: High school graduate  Occupational History   Occupation: not employed  Tobacco Use   Smoking status: Never   Smokeless tobacco: Never  Vaping Use   Vaping Use: Never used  Substance and Sexual Activity   Alcohol use: No    Alcohol/week: 0.0 standard drinks   Drug use: No   Sexual activity: Yes    Birth control/protection: None  Other Topics Concern   Not on file  Social History Narrative   He lives with wife and two children.   Unemployed at this present time   Right handed   One story home   Highest level of education:  12th grade   Social Determinants of Health   Financial Resource Strain: Not on file  Food Insecurity: Not on file  Transportation Needs: Not on file  Physical Activity: Not on file  Stress: Not on file  Social Connections: Not on file    Allergies: No Known Allergies  Metabolic Disorder Labs: Lab Results  Component Value Date   HGBA1C 6.0 (H) 03/02/2021   MPG 137 05/27/2019   MPG 137 12/29/2018   No results found for: PROLACTIN Lab Results  Component Value Date   CHOL 171 07/20/2020   TRIG 54 07/20/2020   HDL 45 07/20/2020   CHOLHDL 3.8 07/20/2020   VLDL 10 12/04/2016   LDLCALC 115 (H) 07/20/2020   LDLCALC 131 (H) 05/27/2019   Lab Results  Component Value Date   TSH 2.450 07/20/2020   TSH 1.93 06/08/2019    Therapeutic Level Labs: No results found for: LITHIUM No results found for: VALPROATE No components found for:  CBMZ  Current Medications: Current Outpatient Medications  Medication Sig Dispense Refill   Brexpiprazole 3 MG TABS Take 1 tablet (3 mg total) by mouth at bedtime. 90 tablet 0   buprenorphine (SUBUTEX) 2 MG SUBL SL tablet Place 1 tablet (2 mg total) under the tongue daily. 28 tablet 2    buprenorphine (SUBUTEX) 2 MG SUBL SL tablet Place 1 tablet (2 mg total) under the tongue daily. 28 tablet 2   fluocinonide ointment (LIDEX) 0.05 % Apply twice daily to back for 2 weeks, then daily for 2 weeks, then every other day for 2 weeks 60 g 3   gentamicin ointment (GARAMYCIN) 0.1 % Apply to affected toenails daily for 3 weeks, then every other day for 3 weeks. 15 g 1   hydrocortisone  2.5 % cream APPLY TO FACE 2 TIMES DAILY FOR 3 WEEKS, THEN DAILY FOR 3 WEEKS, THEN EVERY OTHER DAY FOR 3 WEEKS THEN STOP. REPEAT AS NEEDED. 30 g 3   ibuprofen (ADVIL) 800 MG tablet TAKE ONE TABLET BY MOUTH AT ONSET OF SEVERE HEADACHE WITH IMITREX TABLET. MAY REPEAT ONCE AFTER 8 HOURS FOR PERSISTENT HEADACHE 30 tablet 0   ketoconazole (NIZORAL) 2 % shampoo Apply to face twice weekly and back daily x 1 wk, then twice weekly. 120 mL 11   loratadine (CLARITIN) 10 MG tablet Take 1 tablet (10 mg total) by mouth daily. 90 tablet 3   mirtazapine (REMERON) 15 MG tablet Take 1 tablet (15 mg total) by mouth at bedtime. 30 tablet 1   mometasone (NASONEX) 50 MCG/ACT nasal spray Place 2 sprays into the nose daily. 51 g 1   sodium chloride (OCEAN) 0.65 % nasal spray PLACE 1 SPRAY INTO THE NOSE AS NEEDED FOR CONGESTION. 30 mL 12   SUMAtriptan (IMITREX) 100 MG tablet TAKE 1 TABLET BY MOUTH AT HEADACHE ONSET, FOR SEVERE HEADACHE. MAY REPEAT ONCE ONLY IN 24 HOURS, 2 HOURS AFTER 1ST TABLET. MAX USE=TWICE WEEKLY 10 tablet 2   topiramate (TOPAMAX) 100 MG tablet Take 1 tablet (100 mg total) by mouth 2 (two) times daily. 180 tablet 1   traMADol (ULTRAM) 50 MG tablet Take 50 mg by mouth 2 (two) times daily. (Patient not taking: Reported on 03/02/2021)     traMADol (ULTRAM) 50 MG tablet Take 2 tablets (100 mg total) by mouth every 6 (six) hours as needed. 180 tablet 0   traMADol (ULTRAM) 50 MG tablet Take 2 tablets (100 mg total) by mouth every 6 (six) hours as needed. (Patient not taking: Reported on 03/02/2021) 180 tablet 2   traMADol  (ULTRAM) 50 MG tablet Take 2 tablets (100 mg total) by mouth every 6 (six) hours as needed. 180 tablet 2   tretinoin (RETIN-A) 0.025 % cream Apply 1 application topically at bedtime.     venlafaxine XR (EFFEXOR-XR) 150 MG 24 hr capsule Take 1 capsule (150 mg total) by mouth daily. Total of 225 mg daily, take along with 75 mg capsule. 90 capsule 0   venlafaxine XR (EFFEXOR-XR) 75 MG 24 hr capsule Take 1 capsule (75 mg total) by mouth daily. Total of 225 mg daily, take along with 150 mg cap 90 capsule 0   No current facility-administered medications for this visit.     Musculoskeletal: Strength & Muscle Tone:  N/A Gait & Station:  N/A Patient leans: N/A  Psychiatric Specialty Exam: Review of Systems  Psychiatric/Behavioral:  Positive for decreased concentration, dysphoric mood and sleep disturbance. Negative for agitation, behavioral problems, confusion, hallucinations, self-injury and suicidal ideas. The patient is nervous/anxious. The patient is not hyperactive.   All other systems reviewed and are negative.  There were no vitals taken for this visit.There is no height or weight on file to calculate BMI.  General Appearance: Fairly Groomed  Eye Contact:  Good  Speech:  Clear and Coherent  Volume:  Normal  Mood:   fine  Affect:  Appropriate, Congruent, and grimace at times due to pain  Thought Process:  Coherent  Orientation:  Full (Time, Place, and Person)  Thought Content: Logical   Suicidal Thoughts:  No  Homicidal Thoughts:  No  Memory:  Immediate;   Good  Judgement:  Good  Insight:  Good  Psychomotor Activity:  Normal  Concentration:  Concentration: Good and Attention Span: Good  Recall:  Good  Fund of Knowledge: Good  Language: Good  Akathisia:  No  Handed:  Right  AIMS (if indicated): not done  Assets:  Communication Skills Desire for Improvement  ADL's:  Intact  Cognition: WNL  Sleep:  Poor   Screenings: GAD-7    Flowsheet Row Video Visit from 07/11/2020 in  [facility] Office Visit from 12/29/2018 in [facility] Office Visit from 10/28/2018 in [facility] Office Visit from 01/28/2018 in [facility] Phone Follow Up from 01/08/2018 in [facility]  Total GAD-7 Score 15 21 20 15 10       PHQ2-9    Flowsheet Row Video Visit from 04/27/2021 in [facility] Visit from 03/02/2021 in [place] from 11/03/2020 in [facility] Office Visit from 09/18/2020 in [facility] Video Visit from 08/23/2020 in [place]  PHQ-2 Total Score 2 3 4 6 3   PHQ-9 Total Score 11 11 16 17 13       Flowsheet Row Counselor from 11/03/2020 in [facility] Video Visit from 10/26/2020 in [facility] Video Visit from 09/20/2020 in [facility] No Risk No Risk No Risk        Assessment and Plan:  Rulon Freitag is a 46 y.o. year old male with a history of depression, bilateral carpel tunnel syndrome, who presents for follow up appointment for below.    1. Current moderate episode of major depressive disorder without prior episode ([place]) 2. Anxiety There has been slight but steady improvement in depressive symptoms and anxiety, which coincided with starting mirtazapine.  Psychosocial stressors includes unemployment, loss of his maternal aunt, migraine, and pain secondary to carpal tunnel syndrome.  Will do further up titration of mirtazapine to optimize treatment for depression and anxiety.  Discussed potential risk of sertraline syndrome with concomitant use of venlafaxine and tramadol, and also increasing in appetite/drowsiness.  Will continue venlafaxine and rexulti to target depression.  Coached behavioral activation.   3. Insomnia, unspecified type He  continues to report middle insomnia and fatigue, although there is no known snoring.  On today's evaluation, he states that he has not been on [place] for a [place].  Will adjust medication as above.    Plan  Continue venlafaxine 225 mg (150 mg + 75 mg) daily Increase mirtazapine 15 mg at night  Continue Rexulti 3 mg at night - monitor drowsiness  Next appointment: 2/27 at 8:30 for 30 mins, video [email or web address]. He declined in person visit at this time as he would not be able to drive long distance - on tramadol , [place] [address]  - on topiramate for migraine   Past trials of medication: sertraline, fluoxetine, mirtazapine, nortriptyline for carpel tunnel syndrome, bupropion (drowsiness), quetiapine (drowsiness), Abilify ("off balance"), Trazodone, Ambien (limited benefit), lorazepam (worsening in anxiety, drowsiness) , temazepam, Lunesta   The patient demonstrates the following risk factors for suicide: Chronic risk factors for suicide include: psychiatric disorder of depression. Acute risk factors for suicide include: unemployment and loss (financial, interpersonal, professional). Protective factors for this patient include: positive social support, responsibility to others (children, family) and hope for the future. Considering these factors, the overall suicide risk at this point appears to be low. Patient is appropriate for outpatient follow up.  [name], MD 05/29/2021, 9:29 AM

## 2021-05-29 ENCOUNTER — Other Ambulatory Visit (HOSPITAL_COMMUNITY): Payer: Self-pay

## 2021-05-29 ENCOUNTER — Telehealth (INDEPENDENT_AMBULATORY_CARE_PROVIDER_SITE_OTHER): Payer: 59 | Admitting: Psychiatry

## 2021-05-29 ENCOUNTER — Encounter: Payer: Self-pay | Admitting: Psychiatry

## 2021-05-29 ENCOUNTER — Other Ambulatory Visit: Payer: Self-pay

## 2021-05-29 DIAGNOSIS — F321 Major depressive disorder, single episode, moderate: Secondary | ICD-10-CM | POA: Diagnosis not present

## 2021-05-29 DIAGNOSIS — G4700 Insomnia, unspecified: Secondary | ICD-10-CM | POA: Diagnosis not present

## 2021-05-29 DIAGNOSIS — F419 Anxiety disorder, unspecified: Secondary | ICD-10-CM | POA: Diagnosis not present

## 2021-05-29 MED ORDER — MIRTAZAPINE 15 MG PO TABS
15.0000 mg | ORAL_TABLET | Freq: Every day | ORAL | 1 refills | Status: DC
  Filled 2021-05-29: qty 30, 30d supply, fill #0

## 2021-05-29 NOTE — Patient Instructions (Addendum)
Continue venlafaxine 225 mg (150 mg + 75 mg) daily Increase mirtazapine 15 mg at night  Continue Rexulti 3 mg at night  Next appointment: 2/27 at 8:30, video

## 2021-05-31 DIAGNOSIS — Z79891 Long term (current) use of opiate analgesic: Secondary | ICD-10-CM | POA: Diagnosis not present

## 2021-05-31 DIAGNOSIS — G5602 Carpal tunnel syndrome, left upper limb: Secondary | ICD-10-CM | POA: Diagnosis not present

## 2021-05-31 DIAGNOSIS — M792 Neuralgia and neuritis, unspecified: Secondary | ICD-10-CM | POA: Diagnosis not present

## 2021-05-31 DIAGNOSIS — G5601 Carpal tunnel syndrome, right upper limb: Secondary | ICD-10-CM | POA: Diagnosis not present

## 2021-05-31 DIAGNOSIS — Z79899 Other long term (current) drug therapy: Secondary | ICD-10-CM | POA: Diagnosis not present

## 2021-05-31 DIAGNOSIS — G894 Chronic pain syndrome: Secondary | ICD-10-CM | POA: Diagnosis not present

## 2021-05-31 DIAGNOSIS — E1142 Type 2 diabetes mellitus with diabetic polyneuropathy: Secondary | ICD-10-CM | POA: Diagnosis not present

## 2021-06-01 ENCOUNTER — Other Ambulatory Visit (HOSPITAL_COMMUNITY): Payer: Self-pay

## 2021-06-01 MED ORDER — BUPRENORPHINE 5 MCG/HR TD PTWK
1.0000 | MEDICATED_PATCH | TRANSDERMAL | 1 refills | Status: DC
  Filled 2021-06-01 – 2021-06-27 (×2): qty 4, 28d supply, fill #0

## 2021-06-01 MED ORDER — TRAMADOL HCL 50 MG PO TABS
100.0000 mg | ORAL_TABLET | Freq: Four times a day (QID) | ORAL | 0 refills | Status: DC | PRN
Start: 2021-06-01 — End: 2021-08-31
  Filled 2021-06-01: qty 180, 23d supply, fill #0

## 2021-06-05 ENCOUNTER — Other Ambulatory Visit (HOSPITAL_COMMUNITY): Payer: Self-pay

## 2021-06-05 ENCOUNTER — Ambulatory Visit (HOSPITAL_COMMUNITY): Payer: 59 | Admitting: Psychiatry

## 2021-06-05 ENCOUNTER — Telehealth (HOSPITAL_COMMUNITY): Payer: Self-pay | Admitting: Psychiatry

## 2021-06-05 ENCOUNTER — Other Ambulatory Visit: Payer: Self-pay

## 2021-06-05 NOTE — Telephone Encounter (Signed)
Therapist contacted patient via text through [place] for scheduled appointment.  Patient is sick today.  Therapist and patient agreed to cancel and reschedule

## 2021-06-19 ENCOUNTER — Ambulatory Visit (HOSPITAL_COMMUNITY): Payer: 59 | Admitting: Psychiatry

## 2021-06-27 ENCOUNTER — Other Ambulatory Visit (HOSPITAL_COMMUNITY): Payer: Self-pay

## 2021-06-28 ENCOUNTER — Other Ambulatory Visit (HOSPITAL_COMMUNITY): Payer: Self-pay

## 2021-07-02 ENCOUNTER — Other Ambulatory Visit (HOSPITAL_COMMUNITY): Payer: Self-pay

## 2021-07-02 DIAGNOSIS — M792 Neuralgia and neuritis, unspecified: Secondary | ICD-10-CM | POA: Diagnosis not present

## 2021-07-02 DIAGNOSIS — E1142 Type 2 diabetes mellitus with diabetic polyneuropathy: Secondary | ICD-10-CM | POA: Diagnosis not present

## 2021-07-02 DIAGNOSIS — Z79891 Long term (current) use of opiate analgesic: Secondary | ICD-10-CM | POA: Diagnosis not present

## 2021-07-02 DIAGNOSIS — G894 Chronic pain syndrome: Secondary | ICD-10-CM | POA: Diagnosis not present

## 2021-07-02 DIAGNOSIS — G5601 Carpal tunnel syndrome, right upper limb: Secondary | ICD-10-CM | POA: Diagnosis not present

## 2021-07-02 DIAGNOSIS — G5602 Carpal tunnel syndrome, left upper limb: Secondary | ICD-10-CM | POA: Diagnosis not present

## 2021-07-02 DIAGNOSIS — Z79899 Other long term (current) drug therapy: Secondary | ICD-10-CM | POA: Diagnosis not present

## 2021-07-02 MED ORDER — TRAMADOL HCL 50 MG PO TABS
100.0000 mg | ORAL_TABLET | Freq: Four times a day (QID) | ORAL | 0 refills | Status: DC | PRN
  Filled 2021-07-02: qty 180, 23d supply, fill #0

## 2021-07-02 MED ORDER — BUPRENORPHINE 5 MCG/HR TD PTWK: 1.0000 | MEDICATED_PATCH | TRANSDERMAL | 0 refills | Status: DC

## 2021-07-03 ENCOUNTER — Other Ambulatory Visit: Payer: Self-pay

## 2021-07-03 ENCOUNTER — Ambulatory Visit (INDEPENDENT_AMBULATORY_CARE_PROVIDER_SITE_OTHER): Payer: 59 | Admitting: Psychiatry

## 2021-07-03 DIAGNOSIS — F321 Major depressive disorder, single episode, moderate: Secondary | ICD-10-CM | POA: Diagnosis not present

## 2021-07-03 NOTE — Progress Notes (Signed)
Virtual Visit via Telephone Note  I connected with Gabriel [name] on 07/03/21 at 9:12 AM EST by telephone and verified that I am speaking with the correct person using two identifiers.  Location: Patient: Home Provider: [place] office    I discussed the limitations, risks, security and privacy concerns of performing an evaluation and management service by telephone and the availability of in person appointments. I also discussed with the patient that there may be a patient responsible charge related to this service. The patient expressed understanding and agreed to proceed.   I provided 39 minutes of non-face-to-face time during this encounter.   [name], LCSW   THERAPIST PROGRESS NOTE    Session Time:  Tuesday  07/03/2021 9:12 AM - 9:51 AM   Participation Level: Active  Behavioral Response: AlertDepressed/rocking frequently  Type of Therapy: Individual Therapy  Treatment Goals addressed:  alleviate depressive symptoms and resume normal involvement and activity and social activity, learn and implement cognitive and behavioral strategies to overcome depression,    Interventions: CBT and Supportive  Summary: Gabriel [name] is a 46 y.o. male who is referred for services by PCP Dr. Tula Nakayama due to patient experiencing symptoms of depression. He denies any psychiatric hospitalizations, He saw therapist Gwynneth Macleod S  wan in [place] for a few months. Patient reports symptoms began when he lost his job about a year ago. Per his report, he was fired after he complained about race discrimination. 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 He states now feeling as though he failed his family. He states not wanting to be around anyone and having difficulty engaging with his wife and children. He fears losing his family.  He reports  ruminating thoughts about job and the past, excessive worry depressed mood, tearfulness, anxiety, isolative behaviors, poor motivation, poor concentration, irritability, sleep difficulty, thoughts and feelings of hopelessness and worthlessness. He denies any suicidal ideations.  Patient last was seen via virtual visit about 2 months ago. He continues to experience mild to moderate symptoms of depression  and anxiety including fatigue, poor concentration, memory difficulty, excessive worry, nervousness, and sleep difficulty. He reports using deep breathing and the leaves on a stream exercise have been very helpful.  He says he has been using the daily.  He is trying to maintain steady engagement with his family and says he interacts with them about 3-4 times per week.  He states trying to go outside and walk 3-4 times per week with much encouragement and support from his wife.  He reports continued worry about not being able to take care of his family.  He reports having thoughts of losing his family if he does not take care of them or provide for them financially and emotionally.  He reports some financial relief as he was approved for disability per his report.  He expresses reluctance about talking with his wife about concerns as he states he does not want to add any more problems to wife.      SI/HI .Nowithout intent/plan  Therapist Response:  reviewed symptoms, administered PHQ 2 and 9/GAD-7, discussed stressors, facilitated expression of thoughts and feelings, validated feelings, praised and reinforced patient's efforts to practice relaxation techniques and mindfulness activity, discussed effects, praised and reinforced patient's efforts to engage with family and to go walking, assisted patient identify  ways to improve consistency and frequency regarding engagement with family, developed plan with patient to set the table and eat dinner with family 5 days/week, assisted patient identify/address any  thoughts and processes that may inhibit implementation of plan, assisted patient link goals with his values,   Plan: Return again in 1-2  weeks  Diagnosis: Axis I: MDD, single episode, severe    Axis II: No diagnosis    [name], LCSW 07/03/2021

## 2021-07-04 NOTE — Progress Notes (Addendum)
Virtual Visit via Video Note  I connected with Gabriel [name] on 07/09/21 at  8:30 AM EST by a video enabled telemedicine application and verified that I am speaking with the correct person using two identifiers.  Location: Patient: home Provider: office Persons participated in the visit- patient, provider    I discussed the limitations of evaluation and management by telemedicine and the availability of in person appointments. The patient expressed understanding and agreed to proceed.    I discussed the assessment and treatment plan with the patient. The patient was provided an opportunity to ask questions and all were answered. The patient agreed with the plan and demonstrated an understanding of the instructions.   The patient was advised to call back or seek an in-person evaluation if the symptoms worsen or if the condition fails to improve as anticipated.  I provided 19 minutes of non-face-to-face time during this encounter.   [name], MD    [facility] MD/PA/NP OP Progress Note  07/09/2021 9:02 AM Gabriel [name]  MRN:  [id number]  Chief Complaint:  Chief Complaint  Patient presents with   Follow-up   Depression   HPI:  This is a follow-up appointment for depression.  He states that there is "nothing new."  He tries to spend time with his family.  He takes a walk 3-4 times a week.  He enjoys it.  He also finds it helpful to do breathing exercise, which she learned through therapy.  He is hoping to be more relaxed.  He has chronic headache, although he has not had it for the past few days. He has depressive symptoms as in PHQ-9.  He has middle insomnia, and has significant fatigue.  He denies SI.  He denies any change since up titration of mirtazapine, and also has not noticed any side effect.  He is willing to try higher dose.  He agrees to come for in person visit next time.     Daily routine: takes a walk with his wife a few times per week Employment: unemployed.  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 He was raised by his mother. He reports good relationship with her, who taught him "love and respect." He has fair relationship with his father  Visit Diagnosis:    ICD-10-CM   1. MDD (major depressive disorder), recurrent episode, moderate (HCC)  F33.1     2. Anxiety  F41.9     3. Insomnia, unspecified type  G47.00       Past Psychiatric History: Please see initial evaluation for full details. I have reviewed the history. No updates at this time.     Past Medical History:  Past Medical History:  Diagnosis Date   Anxiety    Carpal tunnel syndrome 2018   Depression    Depression    Phreesia 05/15/2020   Elevated LFTs    Heart murmur    Insomnia    Seasonal allergies    Vitamin D deficiency     Past Surgical History:  Procedure Laterality Date   NO PAST SURGERIES      Family Psychiatric History: Please see initial evaluation for full details. I have reviewed the history. No updates at this time.     Family History:  Family History  Problem Relation Age of Onset   Diabetes Father  Hypertension Father    Hypertension Mother    Hypertension Maternal Grandfather    Diabetes Maternal Grandfather    Cancer Paternal Grandmother        type unknown   Hypertension Paternal Grandmother    Cancer Paternal Grandfather        type unknown   Hypertension Paternal Grandfather     Social History:  Social History   Socioeconomic History   Marital status: Married    Spouse name: Not on file   Number of children: 2   Years of education: 12   Highest education level: High school graduate  Occupational History   Occupation: not employed  Tobacco Use   Smoking status: Never   Smokeless tobacco: Never  Vaping Use   Vaping Use: Never used  Substance  and Sexual Activity   Alcohol use: No    Alcohol/week: 0.0 standard drinks   Drug use: No   Sexual activity: Yes    Birth control/protection: None  Other Topics Concern   Not on file  Social History Narrative   He lives with wife and two children.   Unemployed at this present time   Right handed   One story home   Highest level of education:  12th grade   Social Determinants of Health   Financial Resource Strain: Not on file  Food Insecurity: Not on file  Transportation Needs: Not on file  Physical Activity: Not on file  Stress: Not on file  Social Connections: Not on file    Allergies: No Known Allergies  Metabolic Disorder Labs: Lab Results  Component Value Date   HGBA1C 6.0 (H) 03/02/2021   MPG 137 05/27/2019   MPG 137 12/29/2018   No results found for: PROLACTIN Lab Results  Component Value Date   CHOL 171 07/20/2020   TRIG 54 07/20/2020   HDL 45 07/20/2020   CHOLHDL 3.8 07/20/2020   VLDL 10 12/04/2016   LDLCALC 115 (H) 07/20/2020   LDLCALC 131 (H) 05/27/2019   Lab Results  Component Value Date   TSH 2.450 07/20/2020   TSH 1.93 06/08/2019    Therapeutic Level Labs: No results found for: LITHIUM No results found for: VALPROATE No components found for:  CBMZ  Current Medications: Current Outpatient Medications  Medication Sig Dispense Refill   [START ON 07/11/2021] mirtazapine (REMERON) 30 MG tablet Take 1 tablet (30 mg total) by mouth at bedtime. 30 tablet 0   Brexpiprazole 3 MG TABS Take 1 tablet (3 mg total) by mouth at bedtime. 90 tablet 0   buprenorphine (BUTRANS) 5 MCG/HR PTWK Place 1 patch onto the skin once a week. 4 patch 1   buprenorphine (BUTRANS) 5 MCG/HR PTWK Place 1 patch onto the skin once a week. 4 patch 0   buprenorphine (SUBUTEX) 2 MG SUBL SL tablet Place 1 tablet (2 mg total) under the tongue daily. 28 tablet 2   buprenorphine (SUBUTEX) 2 MG SUBL SL tablet Place 1 tablet (2 mg total) under the tongue daily. 28 tablet 2   fluocinonide  ointment (LIDEX) 0.05 % Apply twice daily to back for 2 weeks, then daily for 2 weeks, then every other day for 2 weeks 60 g 3   gentamicin ointment (GARAMYCIN) 0.1 % Apply to affected toenails daily for 3 weeks, then every other day for 3 weeks. 15 g 1   hydrocortisone 2.5 % cream APPLY TO FACE 2 TIMES DAILY FOR 3 WEEKS, THEN DAILY FOR 3 WEEKS, THEN EVERY OTHER DAY FOR 3 WEEKS  THEN STOP. REPEAT AS NEEDED. 30 g 3   ibuprofen (ADVIL) 800 MG tablet TAKE ONE TABLET BY MOUTH AT ONSET OF SEVERE HEADACHE WITH IMITREX TABLET. MAY REPEAT ONCE AFTER 8 HOURS FOR PERSISTENT HEADACHE 30 tablet 0   ketoconazole (NIZORAL) 2 % shampoo Apply to face twice weekly and back daily x 1 wk, then twice weekly. 120 mL 11   loratadine (CLARITIN) 10 MG tablet Take 1 tablet (10 mg total) by mouth daily. 90 tablet 3   mometasone (NASONEX) 50 MCG/ACT nasal spray Place 2 sprays into the nose daily. 51 g 1   sodium chloride (OCEAN) 0.65 % nasal spray PLACE 1 SPRAY INTO THE NOSE AS NEEDED FOR CONGESTION. 30 mL 12   SUMAtriptan (IMITREX) 100 MG tablet TAKE 1 TABLET BY MOUTH AT HEADACHE ONSET, FOR SEVERE HEADACHE. MAY REPEAT ONCE ONLY IN 24 HOURS, 2 HOURS AFTER 1ST TABLET. MAX USE=TWICE WEEKLY 10 tablet 2   topiramate (TOPAMAX) 100 MG tablet Take 1 tablet (100 mg total) by mouth 2 (two) times daily. 180 tablet 1   traMADol (ULTRAM) 50 MG tablet Take 50 mg by mouth 2 (two) times daily. (Patient not taking: Reported on 03/02/2021)     traMADol (ULTRAM) 50 MG tablet Take 2 tablets (100 mg total) by mouth every 6 (six) hours as needed. 180 tablet 0   traMADol (ULTRAM) 50 MG tablet Take 2 tablets (100 mg total) by mouth every 6 (six) hours as needed. (Patient not taking: Reported on 03/02/2021) 180 tablet 2   traMADol (ULTRAM) 50 MG tablet Take 2 tablets (100 mg total) by mouth every 6 (six) hours as needed. 180 tablet 2   traMADol (ULTRAM) 50 MG tablet Take 2 tablets (100 mg total) by mouth every 6 (six) hours as needed. 180 tablet 0    traMADol (ULTRAM) 50 MG tablet Take 2 tablets (100 mg total) by mouth every 6 (six) hours as needed. 180 tablet 0   tretinoin (RETIN-A) 0.025 % cream Apply 1 application topically at bedtime.     venlafaxine XR (EFFEXOR-XR) 150 MG 24 hr capsule Take 1 capsule (150 mg total) by mouth daily. Total of 225 mg daily, take along with 75 mg capsule. 90 capsule 0   venlafaxine XR (EFFEXOR-XR) 75 MG 24 hr capsule Take 1 capsule (75 mg total) by mouth daily. Total of 225 mg daily, take along with 150 mg cap 90 capsule 0   No current facility-administered medications for this visit.     Musculoskeletal: Strength & Muscle Tone:  N/A Gait & Station:  N/A Patient leans: N/A  Psychiatric Specialty Exam: Review of Systems  Psychiatric/Behavioral:  Positive for decreased concentration, dysphoric mood and sleep disturbance. Negative for agitation, behavioral problems, confusion, hallucinations, self-injury and suicidal ideas. The patient is nervous/anxious. The patient is not hyperactive.   All other systems reviewed and are negative.  There were no vitals taken for this visit.There is no height or weight on file to calculate BMI.  General Appearance: Fairly Groomed  Eye Contact:  Good  Speech:  Clear and Coherent  Volume:  Normal  Mood:   "same"  Affect:  Appropriate, Congruent, and Restricted  Thought Process:  Coherent  Orientation:  Full (Time, Place, and Person)  Thought Content: Logical   Suicidal Thoughts:  No  Homicidal Thoughts:  No  Memory:  Immediate;   Good  Judgement:  Good  Insight:  Fair  Psychomotor Activity:  Normal  Concentration:  Concentration: Good and Attention Span: Good  Recall:  Good  Fund of Knowledge: Good  Language: Good  Akathisia:  No  Handed:  Right  AIMS (if indicated): not done  Assets:  Communication Skills Desire for Improvement  ADL's:  Intact  Cognition: WNL  Sleep:  Poor   Screenings: GAD-7    Flowsheet Row Counselor from 07/03/2021 in [facility] Video Visit from 07/11/2020 in [facility] Office Visit from 12/29/2018 in [facility] Office Visit from 10/28/2018 in [facility] Office Visit from 01/28/2018 in [facility]  Total GAD-7 Score 14 15 21 20 15       PHQ2-9    Flowsheet Row Video Visit from 07/09/2021 in [facility] Counselor from 07/03/2021 in [facility] Video Visit from 04/27/2021 in [facility] Office Visit from 03/02/2021 in [facility] from 11/03/2020 in [facility]  PHQ-2 Total Score 2 2 2 3 4   PHQ-9 Total Score 9 11 11 11 16       Flowsheet Row Counselor from 11/03/2020 in [facility] Video Visit from 10/26/2020 in [facility] Video Visit from 09/20/2020 in [facility] No Risk No Risk No Risk        Assessment and Plan:  Gabriel [name] is a 46 y.o. year old male with a history of  depression, bilateral carpel tunnel syndrome, who presents for follow up appointment for below.   1. MDD (major depressive disorder), recurrent episode, moderate ([facility]) 2. Anxiety He continues to report depressive symptoms and anxiety since the last visit. Psychosocial stressors includes unemployment, loss of his maternal aunt, migraine, and pain secondary to carpal tunnel syndrome.  Will do further up titration of mirtazapine to optimize treatment for depression and anxiety.  Discussed potential risk of serotonin syndrome with concomitant use of venlafaxine and tramadol, and increase in appetite/drowsiness.  Will continue venlafaxine and rexulti to target depression.  Coached behavioral activation.  Noted that although there has been slow improvement in his depressive symptoms  over the past few years, his mood symptoms are refractory to pharmacological treatment.  Although ECT was discussed, he is not interested.  He also declined TMS due to headache.  He will continue to see Ms. Bynum for therapy.   3. Insomnia, unspecified type He continues to report middle insomnia and significant fatigue. Unknown snoring.  Will make referral for evaluation of sleep apnea.    Plan   Continue venlafaxine 225 mg (150 mg + 75 mg) daily Increase mirtazapine 30 mg at night  Continue Rexulti 3 mg at night - monitor drowsiness  Referral for evaluation of sleep apnea Next appointment: 3/30 at 11 AM for 30 mins in person, video [email or web address].   - on tramadol , [place] 600 Mcg Film  - on topiramate for migraine   Past trials of medication: sertraline, fluoxetine, mirtazapine, nortriptyline for carpel tunnel syndrome, bupropion (drowsiness), quetiapine (drowsiness), Abilify ("off balance"), Trazodone, Ambien (limited benefit), lorazepam (worsening in anxiety, drowsiness) , temazepam, Lunesta   The patient demonstrates the following risk factors for suicide: Chronic risk factors for suicide include: psychiatric disorder of depression. Acute risk factors for suicide include: unemployment and loss (financial, interpersonal, professional). Protective factors for this patient include: positive social support, responsibility to others (children, family) and hope for the future. Considering these factors, the overall suicide risk at this point appears to be low. Patient is appropriate for outpatient follow up.  Collaboration of Care: Collaboration of Care: Other referral for sleep evaluation  Patient/Guardian was advised Release of Information must be obtained prior to any record release in order to collaborate their care with an outside provider. Patient/Guardian was advised if they have not already done so to contact the registration department to sign all necessary forms in order for [place]  to release information regarding their care.   Consent: Patient/Guardian gives verbal consent for treatment and assignment of benefits for services provided during this visit. Patient/Guardian expressed understanding and agreed to proceed.    [name], MD 07/09/2021, 9:02 AM

## 2021-07-06 ENCOUNTER — Other Ambulatory Visit (HOSPITAL_COMMUNITY): Payer: Self-pay

## 2021-07-09 ENCOUNTER — Other Ambulatory Visit: Payer: Self-pay

## 2021-07-09 ENCOUNTER — Telehealth (INDEPENDENT_AMBULATORY_CARE_PROVIDER_SITE_OTHER): Payer: 59 | Admitting: Psychiatry

## 2021-07-09 ENCOUNTER — Encounter: Payer: Self-pay | Admitting: Psychiatry

## 2021-07-09 ENCOUNTER — Other Ambulatory Visit (HOSPITAL_COMMUNITY): Payer: Self-pay

## 2021-07-09 DIAGNOSIS — F419 Anxiety disorder, unspecified: Secondary | ICD-10-CM | POA: Diagnosis not present

## 2021-07-09 DIAGNOSIS — F331 Major depressive disorder, recurrent, moderate: Secondary | ICD-10-CM

## 2021-07-09 DIAGNOSIS — G4700 Insomnia, unspecified: Secondary | ICD-10-CM | POA: Diagnosis not present

## 2021-07-09 MED ORDER — MIRTAZAPINE 30 MG PO TABS
30.0000 mg | ORAL_TABLET | Freq: Every day | ORAL | 0 refills | Status: DC
  Filled 2021-07-09: qty 30, 30d supply, fill #0

## 2021-07-09 NOTE — Addendum Note (Signed)
Addended by: [name] on: 07/09/2021 09:25 AM   Modules accepted: Orders

## 2021-07-09 NOTE — Patient Instructions (Signed)
Continue venlafaxine 225 mg (150 mg + 75 mg) daily Increase mirtazapine 30 mg at night  Continue Rexulti 3 mg at night  Referral for evaluation of sleep apnea Next appointment: 3/30 at 11 AM   The next visit will be in person visit. Please arrive 15 mins before the scheduled time.   [facility]  Address: [address], [place], [place] [address]

## 2021-07-29 DIAGNOSIS — H52223 Regular astigmatism, bilateral: Secondary | ICD-10-CM | POA: Diagnosis not present

## 2021-08-01 ENCOUNTER — Other Ambulatory Visit (HOSPITAL_COMMUNITY): Payer: Self-pay

## 2021-08-01 DIAGNOSIS — E1142 Type 2 diabetes mellitus with diabetic polyneuropathy: Secondary | ICD-10-CM | POA: Diagnosis not present

## 2021-08-01 DIAGNOSIS — G5603 Carpal tunnel syndrome, bilateral upper limbs: Secondary | ICD-10-CM | POA: Diagnosis not present

## 2021-08-01 DIAGNOSIS — G894 Chronic pain syndrome: Secondary | ICD-10-CM | POA: Diagnosis not present

## 2021-08-01 DIAGNOSIS — Z79899 Other long term (current) drug therapy: Secondary | ICD-10-CM | POA: Diagnosis not present

## 2021-08-01 DIAGNOSIS — M792 Neuralgia and neuritis, unspecified: Secondary | ICD-10-CM | POA: Diagnosis not present

## 2021-08-01 MED ORDER — TRAMADOL HCL 50 MG PO TABS
50.0000 mg | ORAL_TABLET | Freq: Four times a day (QID) | ORAL | 5 refills | Status: DC | PRN
Start: 2021-08-01 — End: 2022-04-03
  Filled 2021-08-01: qty 180, 23d supply, fill #0

## 2021-08-08 NOTE — Progress Notes (Signed)
[facility] MD/PA/NP OP Progress Note ? ?08/09/2021 11:43 AM ?Gabriel [name]  ?MRN:  [id number] ? ?Chief Complaint:  ?Chief Complaint  ?Patient presents with  ? Follow-up  ? ?HPI:  ?This is a follow-up appointment for depression and anxiety.  ?He states that there has been no significant change.  He has been trying to eat meals with his family.  He occasionally has decreased appetite/worsening in leg pain, and he tends to stay in the room.  He tries to go outside with his wife.  He finds it helpful that his wife pushes him to do things.  Although he occasionally forces himself to take a walk, he enjoys it/finds it refreshing at times.  He tries to do breathing technique several days a week to relax.  He finds it very helpful.  He still thinks about what happened at work due to his ethnicity.  He feels stressed given her time and effort he put in that work. He also reports that he has leg pain secondary to working so hard. The pain is also a constant reminder of things happened. However, he is "trying," and he will continue to try.  He has depressive symptoms as in PHQ-9. He feels anxious at times. He denies panic attacks. He has initial/middle insomnia.  He has not noticed much difference/side effect since uptitration of mirtazapine.  He feels comfortable to stay on the same medication at this time.  ? ? ?Wt Readings from Last 3 Encounters:  ?08/09/21 210 lb 12.8 oz (95.6 kg)  ?03/02/21 212 lb 1.3 oz (96.2 kg)  ?09/18/20 210 lb 12.8 oz (95.6 kg)  ?  ? ?Visit Diagnosis:  ?  ICD-10-CM   ?1. Insomnia, unspecified type  G47.00   ?  ?2. MDD (major depressive disorder), single episode, mild (HCC)  F32.0 venlafaxine XR (EFFEXOR-XR) 150 MG 24 hr capsule  ?  venlafaxine XR (EFFEXOR-XR) 75 MG 24 hr capsule  ?  ?3. Anxiety  F41.9 venlafaxine XR (EFFEXOR-XR) 150 MG 24 hr capsule  ?  venlafaxine XR (EFFEXOR-XR) 75 MG 24 hr capsule  ?  ? ? ?Past Psychiatric History: Please see initial evaluation for full details. I have reviewed the  history. No updates at this time.  ?  ? ?Past Medical History:  ?Past Medical History:  ?Diagnosis Date  ? Anxiety   ? Carpal tunnel syndrome 2018  ? Depression   ? Depression   ? Phreesia 05/15/2020  ? Elevated LFTs   ? Heart murmur   ? Insomnia   ? Seasonal allergies   ? Vitamin D deficiency   ?  ?Past Surgical History:  ?Procedure Laterality Date  ? NO PAST SURGERIES    ? ? ?Family Psychiatric History: Please see initial evaluation for full details. I have reviewed the history. No updates at this time.  ?  ? ?Family History:  ?Family History  ?Problem Relation Age of Onset  ? Diabetes Father   ? Hypertension Father   ? Hypertension Mother   ? Hypertension Maternal Grandfather   ? Diabetes Maternal Grandfather   ? Cancer Paternal Grandmother   ?     type unknown  ? Hypertension Paternal Grandmother   ? Cancer Paternal Grandfather   ?     type unknown  ? Hypertension Paternal Grandfather   ? ? ?Social History:  ?Social History  ? ?Socioeconomic History  ? Marital status: Married  ?  Spouse name: Not on file  ? Number of children: 2  ? Years of education: 80  ?  Highest education level: High school graduate  ?Occupational History  ? Occupation: not employed  ?Tobacco Use  ? Smoking status: Never  ? Smokeless tobacco: Never  ?Vaping Use  ? Vaping Use: Never used  ?Substance and Sexual Activity  ? Alcohol use: No  ?  Alcohol/week: 0.0 standard drinks  ? Drug use: No  ? Sexual activity: Yes  ?  Birth control/protection: None  ?Other Topics Concern  ? Not on file  ?Social History Narrative  ? He lives with wife and two children.  ? Unemployed at this present time  ? Right handed  ? One story home  ? Highest level of education:  12th grade  ? ?Social Determinants of Health  ? ?Financial Resource Strain: Not on file  ?Food Insecurity: Not on file  ?Transportation Needs: Not on file  ?Physical Activity: Not on file  ?Stress: Not on file  ?Social Connections: Not on file  ? ? ?Allergies: No Known Allergies ? ?Metabolic  Disorder Labs: ?Lab Results  ?Component Value Date  ? HGBA1C 6.0 (H) 03/02/2021  ? MPG 137 05/27/2019  ? MPG 137 12/29/2018  ? ?No results found for: PROLACTIN ?Lab Results  ?Component Value Date  ? CHOL 171 07/20/2020  ? TRIG 54 07/20/2020  ? HDL 45 07/20/2020  ? CHOLHDL 3.8 07/20/2020  ? VLDL 10 12/04/2016  ? LDLCALC 115 (H) 07/20/2020  ? LDLCALC 131 (H) 05/27/2019  ? ?Lab Results  ?Component Value Date  ? TSH 2.450 07/20/2020  ? TSH 1.93 06/08/2019  ? ? ?Therapeutic Level Labs: ?No results found for: LITHIUM ?No results found for: VALPROATE ?No components found for:  CBMZ ? ?Current Medications: ?Current Outpatient Medications  ?Medication Sig Dispense Refill  ? Brexpiprazole (REXULTI) 3 MG TABS Take 1 tablet (3 mg total) by mouth daily. 90 tablet 0  ? buprenorphine (BUTRANS) 5 MCG/HR PTWK Place 1 patch onto the skin once a week. 4 patch 1  ? buprenorphine (BUTRANS) 5 MCG/HR PTWK Place 1 patch onto the skin once a week. 4 patch 0  ? buprenorphine (SUBUTEX) 2 MG SUBL SL tablet Place 1 tablet (2 mg total) under the tongue daily. 28 tablet 2  ? buprenorphine (SUBUTEX) 2 MG SUBL SL tablet Place 1 tablet (2 mg total) under the tongue daily. 28 tablet 2  ? fluocinonide ointment (LIDEX) 0.05 % Apply twice daily to back for 2 weeks, then daily for 2 weeks, then every other day for 2 weeks 60 g 3  ? gentamicin ointment (GARAMYCIN) 0.1 % Apply to affected toenails daily for 3 weeks, then every other day for 3 weeks. 15 g 1  ? hydrocortisone 2.5 % cream APPLY TO FACE 2 TIMES DAILY FOR 3 WEEKS, THEN DAILY FOR 3 WEEKS, THEN EVERY OTHER DAY FOR 3 WEEKS THEN STOP. REPEAT AS NEEDED. 30 g 3  ? ibuprofen (ADVIL) 800 MG tablet TAKE ONE TABLET BY MOUTH AT ONSET OF SEVERE HEADACHE WITH IMITREX TABLET. MAY REPEAT ONCE AFTER 8 HOURS FOR PERSISTENT HEADACHE 30 tablet 0  ? ketoconazole (NIZORAL) 2 % shampoo Apply to face twice weekly and back daily x 1 wk, then twice weekly. 120 mL 11  ? mometasone (NASONEX) 50 MCG/ACT nasal spray Place  2 sprays into the nose daily. 51 g 1  ? SUMAtriptan (IMITREX) 100 MG tablet TAKE 1 TABLET BY MOUTH AT HEADACHE ONSET, FOR SEVERE HEADACHE. MAY REPEAT ONCE ONLY IN 24 HOURS, 2 HOURS AFTER 1ST TABLET. MAX USE=TWICE WEEKLY 10 tablet 2  ? topiramate (TOPAMAX)  100 MG tablet Take 1 tablet (100 mg total) by mouth 2 (two) times daily. 180 tablet 1  ? traMADol (ULTRAM) 50 MG tablet Take 50 mg by mouth 2 (two) times daily.    ? traMADol (ULTRAM) 50 MG tablet Take 2 tablets (100 mg total) by mouth every 6 (six) hours as needed. 180 tablet 0  ? traMADol (ULTRAM) 50 MG tablet Take 2 tablets (100 mg total) by mouth every 6 (six) hours as needed. 180 tablet 2  ? traMADol (ULTRAM) 50 MG tablet Take 2 tablets (100 mg total) by mouth every 6 (six) hours as needed. 180 tablet 2  ? traMADol (ULTRAM) 50 MG tablet Take 2 tablets (100 mg total) by mouth every 6 (six) hours as needed. 180 tablet 0  ? traMADol (ULTRAM) 50 MG tablet Take 2 tablets (100 mg total) by mouth every 6 (six) hours as needed. 180 tablet 0  ? traMADol (ULTRAM) 50 MG tablet Take 1-2 tablets (50-100 mg total) by mouth every 6 (six) hours as needed. 180 tablet 5  ? tretinoin (RETIN-A) 0.025 % cream Apply 1 application topically at bedtime.    ? loratadine (CLARITIN) 10 MG tablet Take 1 tablet (10 mg total) by mouth daily. 90 tablet 3  ? [START ON 08/11/2021] mirtazapine (REMERON) 30 MG tablet Take 1 tablet (30 mg total) by mouth at bedtime. 90 tablet 0  ? sodium chloride (OCEAN) 0.65 % nasal spray PLACE 1 SPRAY INTO THE NOSE AS NEEDED FOR CONGESTION. 30 mL 12  ? venlafaxine XR (EFFEXOR-XR) 150 MG 24 hr capsule Take 1 capsule (150 mg total) by mouth daily. Total of 225 mg daily. Take along with 75 mg cap 90 capsule 0  ? venlafaxine XR (EFFEXOR-XR) 75 MG 24 hr capsule Take 1 capsule (75 mg total) by mouth daily. Total of 225 mg daily, take along with 150 mg cap 90 capsule 1  ? ?No current facility-administered medications for this visit.  ? ? ? ?Musculoskeletal: ?Strength &  Muscle Tone:  normal ?Gait & Station:  (has right leg pain due to neuralgia) ?Patient leans: N/A ? ?Psychiatric Specialty Exam: ?Review of Systems  ?Psychiatric/Behavioral:  Positive for decreased concentrati

## 2021-08-09 ENCOUNTER — Encounter: Payer: Self-pay | Admitting: Psychiatry

## 2021-08-09 ENCOUNTER — Other Ambulatory Visit (HOSPITAL_COMMUNITY): Payer: Self-pay

## 2021-08-09 ENCOUNTER — Other Ambulatory Visit: Payer: Self-pay

## 2021-08-09 ENCOUNTER — Ambulatory Visit (INDEPENDENT_AMBULATORY_CARE_PROVIDER_SITE_OTHER): Payer: 59 | Admitting: Psychiatry

## 2021-08-09 VITALS — BP 133/79 | HR 75 | Temp 98.5°F | Wt 210.8 lb

## 2021-08-09 DIAGNOSIS — F320 Major depressive disorder, single episode, mild: Secondary | ICD-10-CM | POA: Diagnosis not present

## 2021-08-09 DIAGNOSIS — F419 Anxiety disorder, unspecified: Secondary | ICD-10-CM | POA: Diagnosis not present

## 2021-08-09 DIAGNOSIS — G4700 Insomnia, unspecified: Secondary | ICD-10-CM | POA: Diagnosis not present

## 2021-08-09 MED ORDER — VENLAFAXINE HCL ER 75 MG PO CP24
75.0000 mg | ORAL_CAPSULE | Freq: Every day | ORAL | 1 refills | Status: DC
  Filled 2021-08-09: qty 90, 90d supply, fill #0
  Filled 2021-11-19: qty 90, 90d supply, fill #1

## 2021-08-09 MED ORDER — VENLAFAXINE HCL ER 150 MG PO CP24
150.0000 mg | ORAL_CAPSULE | Freq: Every day | ORAL | 0 refills | Status: DC
  Filled 2021-08-09: qty 90, 90d supply, fill #0

## 2021-08-09 MED ORDER — MIRTAZAPINE 30 MG PO TABS
30.0000 mg | ORAL_TABLET | Freq: Every day | ORAL | 0 refills | Status: DC
  Filled 2021-08-09: qty 90, 90d supply, fill #0

## 2021-08-09 MED ORDER — BREXPIPRAZOLE 3 MG PO TABS
3.0000 mg | ORAL_TABLET | Freq: Every day | ORAL | 0 refills | Status: AC
  Filled 2021-08-09: qty 90, 90d supply, fill #0

## 2021-08-09 NOTE — Patient Instructions (Signed)
Continue venlafaxine 225 mg (150 mg + 75 mg) daily ?Continue mirtazapine 30 mg at night  ?Continue Rexulti 3 mg at night  ?Please discuss with Dr. Gerilyn Pilgrim about possible evaluation of sleep apnea ?Next appointment: 5/24 at 9:30  ?

## 2021-08-17 ENCOUNTER — Ambulatory Visit (INDEPENDENT_AMBULATORY_CARE_PROVIDER_SITE_OTHER): Payer: 59 | Admitting: Psychiatry

## 2021-08-17 DIAGNOSIS — F320 Major depressive disorder, single episode, mild: Secondary | ICD-10-CM

## 2021-08-17 NOTE — Progress Notes (Signed)
Virtual Visit via Video Note ? ?I connected with [name] on 08/17/21 at 9:05 AM EDT  by a video enabled telemedicine application and verified that I am speaking with the correct person using two identifiers. ? ?Location: ?Patient: Home ?Provider: [facility] [place] office  ?  ?I discussed the limitations of evaluation and management by telemedicine and the availability of in person appointments. The patient expressed understanding and agreed to proceed. ? ? ?I provided 47 minutes of non-face-to-face time during this encounter. ? ? ?Damone Fancher E Zayah Keilman, LCSW ? ? ? ?THERAPIST PROGRESS NOTE ? ? ? ?Session Time:  Friday  08/17/2021 9:05 AM - 9:52 AM  ? ?Participation Level: Active ? ?Behavioral Response: AlertDepressed/rocking frequently ? ?Type of Therapy: Individual Therapy ? ?Treatment Goals addressed:  alleviate depressive symptoms and resume normal involvement and activity and social activity, learn and implement cognitive and behavioral strategies to overcome depression,  ? ?Progress in treatment:  progressing  ?  ?Interventions: CBT and Supportive ? ?Summary: Gabriel [name] is a 46 y.o. male who is referred for services by PCP Dr. Syliva Overman due to patient experiencing symptoms of depression. He denies any psychiatric hospitalizations, He saw therapist Meredith Staggers S  wan in [place] for a few months. Patient reports symptoms began when he lost his job about a year ago. Per his report, he was fired after he complained about race discrimination. 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 He states now feeling as though he failed his family. He states not wanting to be around anyone and having difficulty engaging with his wife and children. He fears losing his family.  He reports ruminating thoughts about job and the past, excessive worry depressed mood, tearfulness, anxiety,  isolative behaviors, poor motivation, poor concentration, irritability, sleep difficulty, thoughts and feelings of hopelessness and worthlessness. He denies any suicidal ideations. ? ?Patient last was seen via virtual visit about 6-7 weeks ago. He continues to experience mild to moderate symptoms of depression  and anxiety including fatigue, poor concentration, memory difficulty, excessive worry, nervousness, and sleep difficulty. He reports continuing to use deep breathing several times a day to manage anxiety and says it remains helpful.  Per his report, he continues to try to walk several times per week but mainly at his wife's urging.  He reports trying to implement plan of setting the table a couple of times but says he just felt too tired.  He reports he still tries to sit at the table with the family for 5 times per week even if he does not eat as his wife continues to push him to do this.   ?  \SI/HI .Nowithout intent/plan ? ?Therapist Response:  reviewed symptoms, administered PHQ 2 and 9/GAD-7, discussed stressors, facilitated expression of thoughts and feelings, validated feelings, praised and reinforced patient's efforts to practice relaxation techniques discussed effects, praised and reinforced patient's efforts to engage with family and to go walking, assisted patient identify/address thoughts and processes that inhibited consistent implementation of plan to set table, developed plan with patient to implement plan to set the table and eat dinner with family 5 times per week, also requested patient record and bring information to next session, also discussed importance of self-care regarding eating patterns and coping with depression and also possibly addressing some fatigue, encouraged patient to avoid skipping meals, assisted patient  link goals with his values. ? ? Plan: Return again in 1-2  weeks ? ?Diagnosis: Axis I: MDD, single episode, severe ? ?  Axis II: No diagnosis ? ? ? ?[name],  LCSW ?08/17/2021 ? ? ? ? ? ?

## 2021-08-21 ENCOUNTER — Telehealth: Payer: 59 | Admitting: Family Medicine

## 2021-08-23 ENCOUNTER — Encounter: Payer: Self-pay | Admitting: Family Medicine

## 2021-08-23 ENCOUNTER — Telehealth (INDEPENDENT_AMBULATORY_CARE_PROVIDER_SITE_OTHER): Payer: 59 | Admitting: Family Medicine

## 2021-08-23 DIAGNOSIS — M79669 Pain in unspecified lower leg: Secondary | ICD-10-CM

## 2021-08-24 NOTE — Progress Notes (Signed)
Visit canceled by patient.

## 2021-08-24 NOTE — Patient Instructions (Signed)
Visit canceled by pt, will keep 4/21 appt ?

## 2021-08-30 ENCOUNTER — Ambulatory Visit (INDEPENDENT_AMBULATORY_CARE_PROVIDER_SITE_OTHER): Payer: 59 | Admitting: Psychiatry

## 2021-08-30 DIAGNOSIS — F320 Major depressive disorder, single episode, mild: Secondary | ICD-10-CM | POA: Diagnosis not present

## 2021-08-30 NOTE — Progress Notes (Signed)
Virtual Visit via Video Note ? ?I connected with Gabriel [name] on 08/30/21 at 10:11 AM EDT  by a video enabled telemedicine application and verified that I am speaking with the correct person using two identifiers. ? ?Location: ?Patient: Home ?Provider: [facility] [place] office  ?  ?I discussed the limitations of evaluation and management by telemedicine and the availability of in person appointments. The patient expressed understanding and agreed to proceed. ? ? ?I provided 48 minutes of non-face-to-face time during this encounter. ? ? ?Tamma Brigandi E Elidia Bonenfant, LCSW ? ?THERAPIST PROGRESS NOTE ? ? ? ?Session Time:  Thursday   08/30/2021 10:11 AM - 10:59 AM  ? ?Participation Level: Active ? ?Behavioral Response: AlertDepressed/rocking frequently ? ?Type of Therapy: Individual Therapy ? ?Treatment Goals addressed:  alleviate depressive symptoms and resume normal involvement and activity and social activity, learn and implement cognitive and behavioral strategies to overcome depression,  ? ?Progress in treatment:  progressing  ?  ?Interventions: CBT and Supportive ? ?Summary: Gabriel [name] is a 46 y.o. male who is referred for services by PCP Dr. Syliva Overman due to patient experiencing symptoms of depression. He denies any psychiatric hospitalizations, He saw therapist Meredith Staggers S  wan in [place] for a few months. Patient reports symptoms began when he lost his job about a year ago. Per his report, he was fired after he complained about race discrimination. 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 He states now feeling as though he failed his family. He states not wanting to be around anyone and having difficulty engaging with his wife and children. He fears losing his family.  He reports ruminating thoughts about job and the past, excessive worry depressed mood, tearfulness,  anxiety, isolative behaviors, poor motivation, poor concentration, irritability, sleep difficulty, thoughts and feelings of hopelessness and worthlessness. He denies any suicidal ideations. ? ?Patient last was seen via virtual visit about 2 weeks ago. He continues to experience mild to moderate symptoms of depression  and anxiety including fatigue, poor concentration, memory difficulty, excessive worry, nervousness, and sleep difficulty as reflected in PHQ 2 and 9/GAD-7.  However, he reports decreased intensity and frequency of depressive symptoms.  He implemented plan [name] the table to eat dinner with family 4 times per week since last session.  Patient reports feeling a sense of accomplishment in doing this.  He also reports trying to avoids skipping meals.  He states he may not eat a full meal, but he will at least eats something like fruit.  He continues to have ruminating thoughts about the loss of his job.  Per his report, these mainly are triggered when he experiences pain.  Patient states this triggers thoughts of the sacrifice that he may for the job and then being mistreated.  This results in patient becoming more depressed.   ?  \SI/HI .Nowithout intent/plan ? ?Therapist Response:  reviewed symptoms, administered PHQ 2 and 9/GAD-7, raised and reinforced patient's efforts to implement plan discussed in last session, discussed effects on thoughts/mood/behavior, develop plan with patient to increase frequency of improvement and plan to 5 times per week, encouraged patient to continue to try to improve eating patterns, facilitated patient expressing thoughts and feelings about losing his job, discussed effects on the way he views himself and overall, developed plan with patient to begin journaling and bring journal entries to  next session  Plan: Return again in 1-2  weeks ? ?Diagnosis: Axis I: MDD, single episode, severe ? ?  Axis II: No diagnosis ? ?Collaboration of Care: Psychiatrist AEB patient working  with psychiatrist Dr. Vanetta Shawl ? ?Patient/Guardian was advised Release of Information must be obtained prior to any record release in order to collaborate their care with an outside provider. Patient/Guardian was advised if they have not already done so to contact the registration department to sign all necessary forms in order for [place] to release information regarding their care.  ? ?Consent: Patient/Guardian gives verbal consent for treatment and assignment of benefits for services provided during this visit. Patient/Guardian expressed understanding and agreed to proceed.  ? ?Tonetta Napoles Debe Coder, LCSW ?08/30/2021 ? ? ? ? ? ? ?

## 2021-08-31 ENCOUNTER — Ambulatory Visit: Payer: 59 | Admitting: Family Medicine

## 2021-08-31 ENCOUNTER — Other Ambulatory Visit (HOSPITAL_COMMUNITY): Payer: Self-pay

## 2021-08-31 ENCOUNTER — Encounter: Payer: Self-pay | Admitting: Family Medicine

## 2021-08-31 VITALS — BP 116/75 | HR 79 | Ht 72.0 in | Wt 211.0 lb

## 2021-08-31 DIAGNOSIS — F322 Major depressive disorder, single episode, severe without psychotic features: Secondary | ICD-10-CM

## 2021-08-31 DIAGNOSIS — R7302 Impaired glucose tolerance (oral): Secondary | ICD-10-CM | POA: Diagnosis not present

## 2021-08-31 DIAGNOSIS — E559 Vitamin D deficiency, unspecified: Secondary | ICD-10-CM | POA: Diagnosis not present

## 2021-08-31 DIAGNOSIS — F5104 Psychophysiologic insomnia: Secondary | ICD-10-CM

## 2021-08-31 DIAGNOSIS — G43011 Migraine without aura, intractable, with status migrainosus: Secondary | ICD-10-CM

## 2021-08-31 DIAGNOSIS — Z13220 Encounter for screening for lipoid disorders: Secondary | ICD-10-CM

## 2021-08-31 DIAGNOSIS — Z0289 Encounter for other administrative examinations: Secondary | ICD-10-CM | POA: Diagnosis not present

## 2021-08-31 DIAGNOSIS — J3089 Other allergic rhinitis: Secondary | ICD-10-CM | POA: Diagnosis not present

## 2021-08-31 DIAGNOSIS — R7303 Prediabetes: Secondary | ICD-10-CM

## 2021-08-31 DIAGNOSIS — M5416 Radiculopathy, lumbar region: Secondary | ICD-10-CM

## 2021-08-31 DIAGNOSIS — F419 Anxiety disorder, unspecified: Secondary | ICD-10-CM

## 2021-08-31 DIAGNOSIS — R5383 Other fatigue: Secondary | ICD-10-CM

## 2021-08-31 DIAGNOSIS — Z1211 Encounter for screening for malignant neoplasm of colon: Secondary | ICD-10-CM | POA: Diagnosis not present

## 2021-08-31 MED ORDER — XHANCE 93 MCG/ACT NA EXHU
1.0000 | INHALANT_SUSPENSION | Freq: Two times a day (BID) | NASAL | 3 refills | Status: DC
  Filled 2021-08-31: qty 16, 30d supply, fill #0

## 2021-08-31 NOTE — Progress Notes (Signed)
? ?  Gabriel [name]     MRN: [id number]      DOB: July 04, 1975 ? ? ?HPI ?Gabriel [name] is here for follow up and re-evaluation of chronic medical conditions, medication management and review of any available recent lab and radiology data.  ?Preventive health is updated, specifically  Cancer screening and Immunization.   ?Maintained on tramadol only for his neuropathic pain and requests that pain management be done through primary care ?Depression slightly improved, though still c/o littl energy and not very involved in much activities ?Intermittent right calf spasm in past  2 to 3 week, denies dyspnea , hemoptysis or cough. ?C/o intermittent Right abdominal pain radiating to back, denies nausea, vomit, change in stool or rectal blood, has chronic back pain  ?Migraine headaches are improved , though not taking topamax daily as prescribed , will do so for even greater improvement ? ?ROS ?Denies recent fever or chills.c/o chronic fatigue ?C/o increased sinus congestion and nasal drainage, no sore throat or ear pain. ?Denies chest congestion, productive cough or wheezing. ?Denies chest pains, palpitations and leg swelling ?Denies abdominal pain, nausea, vomiting,diarrhea or constipation.   ?Denies dysuria, frequency, hesitancy or incontinence. ?Chronic neuropathic pain down right leg, now on tramadol 100 mg twice daily , wants o d/c pain management. ?Still has 4 headaches/ month, not using topamax daily as prescriebd, gets relief with medication. ?C/os depression, anxiety and  insomnia. ?Denies skin break down or rash. ? ? ?PE ? ?BP 116/75   Pulse 79   Ht 6' (1.829 m)   Wt 211 lb 0.6 oz (95.7 kg)   SpO2 97%   BMI 28.62 kg/m?  ? ?Patient alert and oriented and in no cardiopulmonary distress. ? ?HEENT: No facial asymmetry, EOMI,     Neck supple .Mild sinus tenderness ? ?Chest: Clear to auscultation bilaterally. ? ?CVS: S1, S2 no murmurs, no S3.Regular rate. ? ?ABD: Soft non tender.  ? ?Ext: No edema ? ?MS: Adequate ROM  spine, shoulders, hips and knees. ? ?Skin: Intact, no ulcerations or rash noted. ? ?Psych: Good eye contact, normal affect. Memory intact not anxious or depressed appearing. ? ?CNS: CN 2-12 intact, power,  normal throughout.no focal deficits noted. ? ? ?Assessment & Plan ? ?Intractable migraine without aura and with status migrainosus ?Needs to take topamax daily to reduce frequency, wil use tryprtan for relief of headache ? ?Insomnia ?Ongoing challenge, management per Psych ? ?Depression, major, single episode, severe (HCC) ?Improved score of 9 in 08/2021, continue treatment through Psych ? ?Anxiety ?improved ? ?Perennial allergic rhinitis ?Increased and uncontrolled symptoms currently start fluticasone daily ? ?Right lumbar radiculopathy ?painnmand signed , will notify Neurology management adequate with tramadol 100 mg twice daily,pain contract reviewed, and signed , UDS sent, will assume chronic pain management through primary care ? ?Fatigue ?Chronic fatigue, possible sleep apnea, pulmonary to evaluate, psychologic illness a major contributor ? ? ?

## 2021-08-31 NOTE — Patient Instructions (Addendum)
F/U in 3 months, call if you need me sooner ? ?I will prescribed tramadol 100 mg twice daily when next due and will notify Dr Merlene Laughter, need to sign a pain contract here  ? ? ?You are being referred for sleep study due to chronic fatigue ?Labs needed,  next week, fasting, lipid, cmp and EGFr, Magnesium , tSH, vit D, HBA1C ? ?You are referrd top Dr Hilarie Fredrickson for colonoscopy ? ?UDS today ? ?Medication prescribed for allergies, xhance spray ? ?Thanks for choosing [facility], we consider it a privelige to serve you. ? ?

## 2021-09-02 DIAGNOSIS — R5383 Other fatigue: Secondary | ICD-10-CM | POA: Insufficient documentation

## 2021-09-02 NOTE — Assessment & Plan Note (Signed)
Chronic fatigue, possible sleep apnea, pulmonary to evaluate, psychologic illness a major contributor ?

## 2021-09-02 NOTE — Assessment & Plan Note (Signed)
Needs to take topamax daily to reduce frequency, wil use tryprtan for relief of headache ?

## 2021-09-02 NOTE — Assessment & Plan Note (Signed)
Increased and uncontrolled symptoms currently start fluticasone daily ?

## 2021-09-02 NOTE — Assessment & Plan Note (Signed)
Improved score of 9 in 08/2021, continue treatment through Psych ?

## 2021-09-02 NOTE — Assessment & Plan Note (Signed)
improved

## 2021-09-02 NOTE — Assessment & Plan Note (Signed)
painnmand signed , will notify Neurology management adequate with tramadol 100 mg twice daily,pain contract reviewed, and signed , UDS sent, will assume chronic pain management through primary care ?

## 2021-09-02 NOTE — Assessment & Plan Note (Signed)
Ongoing challenge, management per Psych ?

## 2021-09-07 LAB — TOXASSURE SELECT 13 (MW), URINE

## 2021-09-13 ENCOUNTER — Ambulatory Visit (HOSPITAL_COMMUNITY): Payer: 59 | Admitting: Psychiatry

## 2021-09-17 ENCOUNTER — Ambulatory Visit (INDEPENDENT_AMBULATORY_CARE_PROVIDER_SITE_OTHER): Payer: 59 | Admitting: Psychiatry

## 2021-09-17 DIAGNOSIS — R7303 Prediabetes: Secondary | ICD-10-CM | POA: Diagnosis not present

## 2021-09-17 DIAGNOSIS — F320 Major depressive disorder, single episode, mild: Secondary | ICD-10-CM | POA: Diagnosis not present

## 2021-09-17 DIAGNOSIS — Z13220 Encounter for screening for lipoid disorders: Secondary | ICD-10-CM | POA: Diagnosis not present

## 2021-09-17 DIAGNOSIS — E559 Vitamin D deficiency, unspecified: Secondary | ICD-10-CM | POA: Diagnosis not present
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
## 2021-09-18 LAB — CMP14+EGFR
ALT: 65 IU/L — ABNORMAL HIGH (ref 0–44)
AST: 38 IU/L (ref 0–40)
Albumin/Globulin Ratio: 1.4 (ref 1.2–2.2)
Albumin: 4.5 g/dL (ref 4.0–5.0)
Alkaline Phosphatase: 94 IU/L (ref 44–121)
BUN/Creatinine Ratio: 14 (ref 9–20)
BUN: 15 mg/dL (ref 6–24)
Bilirubin Total: 0.5 mg/dL (ref 0.0–1.2)
CO2: 25 mmol/L (ref 20–29)
Calcium: 9.5 mg/dL (ref 8.7–10.2)
Chloride: 100 mmol/L (ref 96–106)
Creatinine, Ser: 1.1 mg/dL (ref 0.76–1.27)
Globulin, Total: 3.2 g/dL (ref 1.5–4.5)
Glucose: 111 mg/dL — ABNORMAL HIGH (ref 70–99)
Potassium: 4.5 mmol/L (ref 3.5–5.2)
Sodium: 139 mmol/L (ref 134–144)
Total Protein: 7.7 g/dL (ref 6.0–8.5)
eGFR: 84 mL/min/{1.73_m2} (ref >59)

## 2021-09-18 LAB — MAGNESIUM: Magnesium: 2.1 mg/dL (ref 1.6–2.3)

## 2021-09-18 LAB — LIPID PANEL
Chol/HDL Ratio: 4.2 ratio (ref 0.0–5.0)
Cholesterol, Total: 190 mg/dL (ref 100–199)
HDL: 45 mg/dL (ref >39)
LDL Chol Calc (NIH): 132 mg/dL — ABNORMAL HIGH (ref 0–99)
Triglycerides: 68 mg/dL (ref 0–149)
VLDL Cholesterol Cal: 13 mg/dL (ref 5–40)

## 2021-09-18 LAB — HEMOGLOBIN A1C
Est. average glucose Bld gHb Est-mCnc: 134 mg/dL
Hgb A1c MFr Bld: 6.3 % — ABNORMAL HIGH (ref 4.8–5.6)

## 2021-09-18 LAB — TSH: TSH: 1.79 u[IU]/mL (ref 0.450–4.500)

## 2021-09-18 LAB — VITAMIN D 25 HYDROXY (VIT D DEFICIENCY, FRACTURES): Vit D, 25-Hydroxy: 27.5 ng/mL — ABNORMAL LOW (ref 30.0–100.0)

## 2021-09-26 NOTE — Progress Notes (Signed)
Virtual Visit via Video Note  I connected with Gabriel [name] on 10/03/21 at  9:30 AM EDT by a video enabled telemedicine application and verified that I am speaking with the correct person using two identifiers.  Location: Patient: home Provider: office Persons participated in the visit- patient, provider    I discussed the limitations of evaluation and management by telemedicine and the availability of in person appointments. The patient expressed understanding and agreed to proceed.    I discussed the assessment and treatment plan with the patient. The patient was provided an opportunity to ask questions and all were answered. The patient agreed with the plan and demonstrated an understanding of the instructions.   The patient was advised to call back or seek an in-person evaluation if the symptoms worsen or if the condition fails to improve as anticipated.  I provided 12 minutes of non-face-to-face time during this encounter.   [name], MD   [facility] MD/PA/NP OP Progress Note  10/03/2021 10:01 AM Gabriel [name]  MRN:  [id number]  Chief Complaint:  Chief Complaint  Patient presents with   Follow-up   Depression   HPI:  This is a follow-up appointment for depression and anxiety.  He continues to feel fatigue.  He is helping to set the dinner table.  He has meals with his family a few times per week.  Although he enjoys the time, he sometimes needs to push himself doing things.  He takes a walk 3-4 times per week with his wife.  It has been difficult for him to move around due to neuropathy at times.  His mood has been up and down, although it is not as bad compared to before.  He tends to be worried as he is unable to take care of his family.  He agrees to find another way he can support his family.  He has occasional insomnia.  He has an upcoming sleep evaluation.  He denies change in appetite.  He has difficulty in concentration.  He denies SI.  He feels irritable and wants  to be alone at times.  He denies panic attacks.  He feels comfortable to stay on the current medication regimen at this time.    Visit Diagnosis:    ICD-10-CM   1. MDD (major depressive disorder), single episode, mild ([place])  F32.0     2. Anxiety  F41.9       Past Psychiatric History: Please see initial evaluation for full details. I have reviewed the history. No updates at this time.     Past Medical History:  Past Medical History:  Diagnosis Date   Anxiety    Carpal tunnel syndrome 2018   Depression    Depression    Phreesia 05/15/2020   Elevated LFTs    Heart murmur    Insomnia    Seasonal allergies    Vitamin D deficiency     Past Surgical History:  Procedure Laterality Date   NO PAST SURGERIES      Family Psychiatric History: Please see initial evaluation for full details. I have reviewed the history. No updates at this time.     Family History:  Family History  Problem Relation Age of Onset   Diabetes Father    Hypertension Father    Hypertension Mother    Hypertension Maternal Grandfather    Diabetes Maternal Grandfather    Cancer Paternal Grandmother        type unknown   Hypertension Paternal Grandmother    Cancer  Paternal Grandfather        type unknown   Hypertension Paternal Grandfather     Social History:  Social History   Socioeconomic History   Marital status: Married    Spouse name: Not on file   Number of children: 2   Years of education: 12   Highest education level: High school graduate  Occupational History   Occupation: not employed  Tobacco Use   Smoking status: Never   Smokeless tobacco: Never  Vaping Use   Vaping Use: Never used  Substance and Sexual Activity   Alcohol use: No    Alcohol/week: 0.0 standard drinks   Drug use: No   Sexual activity: Yes    Birth control/protection: None  Other Topics Concern   Not on file  Social History Narrative   He lives with wife and two children.   Unemployed at this present time    Right handed   One story home   Highest level of education:  12th grade   Social Determinants of Health   Financial Resource Strain: Not on file  Food Insecurity: Not on file  Transportation Needs: Not on file  Physical Activity: Not on file  Stress: Not on file  Social Connections: Not on file    Allergies: No Known Allergies  Metabolic Disorder Labs: Lab Results  Component Value Date   HGBA1C 6.3 (H) 09/17/2021   MPG 137 05/27/2019   MPG 137 12/29/2018   No results found for: PROLACTIN Lab Results  Component Value Date   CHOL 190 09/17/2021   TRIG 68 09/17/2021   HDL 45 09/17/2021   CHOLHDL 4.2 09/17/2021   VLDL 10 12/04/2016   LDLCALC 132 (H) 09/17/2021   LDLCALC 115 (H) 07/20/2020   Lab Results  Component Value Date   TSH 1.790 09/17/2021   TSH 2.450 07/20/2020    Therapeutic Level Labs: No results found for: LITHIUM No results found for: VALPROATE No components found for:  CBMZ  Current Medications: Current Outpatient Medications  Medication Sig Dispense Refill   Brexpiprazole (REXULTI) 3 MG TABS Take 1 tablet (3 mg total) by mouth daily. 90 tablet 0   fluocinonide ointment (LIDEX) 0.05 % Apply twice daily to back for 2 weeks, then daily for 2 weeks, then every other day for 2 weeks 60 g 3   Fluticasone Propionate (XHANCE) 93 MCG/ACT EXHU Place 1 spray into the nose in the morning and at bedtime. 16 mL 3   hydrocortisone 2.5 % cream APPLY TO FACE 2 TIMES DAILY FOR 3 WEEKS, THEN DAILY FOR 3 WEEKS, THEN EVERY OTHER DAY FOR 3 WEEKS THEN STOP. REPEAT AS NEEDED. 30 g 3   ibuprofen (ADVIL) 800 MG tablet TAKE ONE TABLET BY MOUTH AT ONSET OF SEVERE HEADACHE WITH IMITREX TABLET. MAY REPEAT ONCE AFTER 8 HOURS FOR PERSISTENT HEADACHE 30 tablet 0   ketoconazole (NIZORAL) 2 % shampoo Apply to face twice weekly and back daily x 1 wk, then twice weekly. 120 mL 11   loratadine (CLARITIN) 10 MG tablet Take 1 tablet (10 mg total) by mouth daily. 90 tablet 3   mirtazapine  (REMERON) 30 MG tablet Take 1 tablet (30 mg total) by mouth at bedtime. 90 tablet 0   sodium chloride (OCEAN) 0.65 % nasal spray PLACE 1 SPRAY INTO THE NOSE AS NEEDED FOR CONGESTION. 30 mL 12   topiramate (TOPAMAX) 100 MG tablet Take 1 tablet (100 mg total) by mouth 2 (two) times daily. 180 tablet 1   traMADol (  ULTRAM) 50 MG tablet Take 1-2 tablets (50-100 mg total) by mouth every 6 (six) hours as needed. 180 tablet 5   tretinoin (RETIN-A) 0.025 % cream Apply 1 application topically at bedtime.     venlafaxine XR (EFFEXOR-XR) 150 MG 24 hr capsule Take 1 capsule (150 mg total) by mouth daily. Take along with 75 mg cap for total of 225mg  per day 90 capsule 0   venlafaxine XR (EFFEXOR-XR) 75 MG 24 hr capsule Take 1 capsule (75 mg total) by mouth daily. Take along with 150 mg cap for total of 225 mg daily 90 capsule 1   No current facility-administered medications for this visit.     Musculoskeletal: Strength & Muscle Tone:  N/A Gait & Station:  N/A Patient leans: N/A  Psychiatric Specialty Exam: Review of Systems  Psychiatric/Behavioral:  Positive for decreased concentration, dysphoric mood and sleep disturbance. Negative for agitation, behavioral problems, confusion, hallucinations, self-injury and suicidal ideas. The patient is nervous/anxious. The patient is not hyperactive.   All other systems reviewed and are negative.  There were no vitals taken for this visit.There is no height or weight on file to calculate BMI.  General Appearance: Fairly Groomed  Eye Contact:  Good  Speech:  Clear and Coherent  Volume:  Normal  Mood:   okay  Affect:  Appropriate, Congruent, and slightly down  Thought Process:  Coherent  Orientation:  Full (Time, Place, and Person)  Thought Content: Logical   Suicidal Thoughts:  No  Homicidal Thoughts:  No  Memory:  Immediate;   Good  Judgement:  Good  Insight:  Good  Psychomotor Activity:  Normal  Concentration:  Concentration: Good and Attention Span:  Good  Recall:  Good  Fund of Knowledge: Good  Language: Good  Akathisia:  No  Handed:  Right  AIMS (if indicated): not done  Assets:  Communication Skills Desire for Improvement  ADL's:  Intact  Cognition: WNL  Sleep:  Fair   Screenings: GAD-7    Health and safety inspector from 08/30/2021 in [place] Counselor from 08/17/2021 in [place] Counselor from 07/03/2021 in [place] Video Visit from 07/11/2020 in [facility] Office Visit from 12/29/2018 in [facility]  Total GAD-7 Score 10 10 14 15 21       PHQ2-9    [place] Office Visit from 08/31/2021 in [place] from 08/30/2021 in [place] Counselor from 08/17/2021 in [place] Office Visit from 08/09/2021 in [place] Video Visit from 07/09/2021 in [place]  PHQ-2 Total Score 2 2 2 2 2   PHQ-9 Total Score 9 8 11 8 9       Flowsheet Row Counselor from 11/03/2020 in [facility] Video Visit from 10/26/2020 in [place] Video Visit from 09/20/2020 in [place] No Risk No Risk No Risk        Assessment and Plan:  Gabriel [name] is a 47 y.o. year old male with a history of depression, bilateral carpel tunnel syndrome , who presents for follow up appointment for below.   1. MDD (major depressive disorder), single episode, mild ([place]) 2. Anxiety Although he continues to struggle with depressive symptoms and anxiety, he has been more engaging in daily activities. Psychosocial stressors includes unemployment, loss of his maternal aunt, migraine, and pain secondary to carpal tunnel syndrome.  Will continue venlafaxine, mirtazapine to target depression and anxiety.  Will continue rexulti for depression.  Coached behavioral activation.  He will continue to see Ms. Bynum for therapy.   # Insomnia He has middle insomnia, fatigue.  He will have sleep evaluation.  Will hold the hypnotics until we receive the results.     Plan  Continue venlafaxine 225 mg (150 mg + 75 mg) daily Continue mirtazapine 30 mg at night  Continue Rexulti 3 mg at night - monitor drowsiness  Next appointment: 7/5 at 11:30 for 30 mins, , video [email or web address].    - on tramadol , [place] 600 Mcg Film  - on topiramate for migraine   Past trials of medication: sertraline, fluoxetine, mirtazapine, nortriptyline for carpel tunnel syndrome, bupropion (drowsiness), quetiapine (drowsiness), Abilify ("off balance"), Trazodone, Ambien (limited benefit), lorazepam (worsening in anxiety, drowsiness) , temazepam, Lunesta            Collaboration of Care: Collaboration of Care: Other N/A  Patient/Guardian was advised Release of Information must be obtained prior to any record release in order to collaborate their care with an outside provider. Patient/Guardian was advised if they have not already done so to contact the registration department to sign all necessary forms in order for [place] to release information regarding their care.   Consent: Patient/Guardian gives verbal consent for treatment and assignment of benefits for services provided during this visit. Patient/Guardian expressed understanding and agreed to proceed.    [name], MD 10/03/2021, 10:01 AM

## 2021-10-03 ENCOUNTER — Encounter: Payer: Self-pay | Admitting: Psychiatry

## 2021-10-03 ENCOUNTER — Telehealth (INDEPENDENT_AMBULATORY_CARE_PROVIDER_SITE_OTHER): Payer: 59 | Admitting: Psychiatry

## 2021-10-03 ENCOUNTER — Other Ambulatory Visit (HOSPITAL_COMMUNITY): Payer: Self-pay

## 2021-10-03 DIAGNOSIS — F419 Anxiety disorder, unspecified: Secondary | ICD-10-CM

## 2021-10-03 DIAGNOSIS — F320 Major depressive disorder, single episode, mild: Secondary | ICD-10-CM | POA: Diagnosis not present

## 2021-10-03 NOTE — Patient Instructions (Signed)
Continue venlafaxine 225 mg (150 mg + 75 mg) daily Continue mirtazapine 30 mg at night  Continue Rexulti 3 mg at night  Next appointment: 7/5 at 11:30

## 2021-10-11 ENCOUNTER — Other Ambulatory Visit (HOSPITAL_COMMUNITY): Payer: Self-pay

## 2021-10-24 ENCOUNTER — Ambulatory Visit: Payer: 59 | Admitting: Pulmonary Disease

## 2021-10-24 ENCOUNTER — Encounter: Payer: Self-pay | Admitting: Pulmonary Disease

## 2021-10-24 ENCOUNTER — Institutional Professional Consult (permissible substitution): Payer: 59 | Admitting: Pulmonary Disease

## 2021-10-24 VITALS — BP 140/82 | HR 64 | Temp 97.7°F | Ht 72.0 in | Wt 206.6 lb

## 2021-10-24 DIAGNOSIS — R0683 Snoring: Secondary | ICD-10-CM

## 2021-10-24 NOTE — Progress Notes (Signed)
[facility]  Chief Complaint  Patient presents with   Consult    Ref for potential OSA. Has a hard time sleeping     Past Surgical History:  He  has a past surgical history that includes No past surgeries.  Past Medical History:  Anxiety, Carpal tunnel, Depression, Allergies, Vit D deficiency  Constitutional:  BP 140/82 (BP Location: Left Arm, Patient Position: Sitting)   Pulse 64   Temp 97.7 F (36.5 C) (Temporal)   Ht 6' (1.829 m)   Wt 206 lb 9.6 oz (93.7 kg)   SpO2 98% Comment: ra  BMI 28.02 kg/m   Brief Summary:  Gabriel [name] is a 46 y.o. male with sleep difficulties.      Subjective:   He has trouble falling asleep and staying asleep.  This has been impacting his mood.  He is followed by behavioral health.  He snores some and wakes up during dreams.  There was concern he could have sleep apnea contributing to his sleep difficulties.  He gets tired during the day and has trouble staying focused.  He goes to sleep between 8 and 9 pm.  He falls asleep in an hour.  He wakes up some times to use the bathroom.  He gets out of bed between 8 and 9 am.  He feels tired in the morning.  He denies morning headache.  He does not use anything to help him fall sleep or stay awake.  He denies sleep walking, sleep talking, bruxism, or nightmares.  There is no history of restless legs.  He denies sleep hallucinations, sleep paralysis, or cataplexy.  The Epworth score is 3 out of 24.  He gets nerve pain in his right leg and this can wake him up at night.   Physical Exam:   Appearance - well kempt   ENMT - no sinus tenderness, no oral exudate, no LAN, Mallampati 4 airway, no stridor  Respiratory - equal breath sounds bilaterally, no wheezing or rales  CV - s1s2 regular rate and rhythm, no murmurs  Ext - no clubbing, no edema  Skin - no rashes  Psych - normal mood and affect   Sleep Tests:    Social History:  He   reports that he has never smoked. He has never used smokeless tobacco. He reports that he does not drink alcohol and does not use drugs.  Family History:  His family history includes Cancer in his paternal grandfather and paternal grandmother; Diabetes in his father and maternal grandfather; Hypertension in his father, maternal grandfather, mother, paternal grandfather, and paternal grandmother.    Discussion:  He has snoring, sleep disruption, and daytime sleepiness.  He has trouble falling asleep and staying asleep.  He has history of depression.  It is possible he could have sleep apnea contributing to his sleep issues.  Assessment/Plan:   Snoring with excessive daytime sleepiness. - will need to arrange for a home sleep study  Sleep onset and sleep maintenance insomnia with history of depression. - discussed proper sleep hygiene, including setting regular sleep-wake schedule - reviewed stimulus control and relaxation techniques - he would need to coordinate with psychiatry if he needed medication therapy to help with insomnia  Obesity. - discussed how weight can impact sleep and risk for sleep disordered breathing - discussed options to assist with weight loss: combination of diet modification, cardiovascular and strength training exercises  Cardiovascular risk. - had an extensive discussion regarding the adverse health consequences  related to untreated sleep disordered breathing - specifically discussed the risks for hypertension, coronary artery disease, cardiac dysrhythmias, cerebrovascular disease, and diabetes - lifestyle modification discussed  Safe driving practices. - discussed how sleep disruption can increase risk of accidents, particularly when driving - safe driving practices were discussed  Therapies for obstructive sleep apnea. - if the sleep study shows significant sleep apnea, then various therapies for treatment were reviewed: CPAP, oral appliance, and surgical  interventions  Time Spent Involved in Patient Care on Day of Examination:  37 minutes  Follow up:   Patient Instructions  Will arrange for home sleep study Will call to arrange for follow up after sleep study reviewed   Medication List:   Allergies as of 10/24/2021   No Known Allergies      Medication List        Accurate as of October 24, 2021  2:02 PM. If you have any questions, ask your nurse or doctor.          fluocinonide ointment 0.05 % Commonly known as: LIDEX Apply twice daily to back for 2 weeks, then daily for 2 weeks, then every other day for 2 weeks   hydrocortisone 2.5 % cream APPLY TO FACE 2 TIMES DAILY FOR 3 WEEKS, THEN DAILY FOR 3 WEEKS, THEN EVERY OTHER DAY FOR 3 WEEKS THEN STOP. REPEAT AS NEEDED.   ibuprofen 800 MG tablet Commonly known as: ADVIL TAKE ONE TABLET BY MOUTH AT ONSET OF SEVERE HEADACHE WITH IMITREX TABLET. MAY REPEAT ONCE AFTER 8 HOURS FOR PERSISTENT HEADACHE   ketoconazole 2 % shampoo Commonly known as: NIZORAL Apply to face twice weekly and back daily x 1 wk, then twice weekly.   loratadine 10 MG tablet Commonly known as: CLARITIN Take 1 tablet (10 mg total) by mouth daily.   mirtazapine 30 MG tablet Commonly known as: REMERON Take 1 tablet (30 mg total) by mouth at bedtime.   Rexulti 3 MG Tabs Generic drug: Brexpiprazole Take 1 tablet (3 mg total) by mouth daily.   sodium chloride 0.65 % nasal spray Commonly known as: OCEAN PLACE 1 SPRAY INTO THE NOSE AS NEEDED FOR CONGESTION.   topiramate 100 MG tablet Commonly known as: Topamax Take 1 tablet (100 mg total) by mouth 2 (two) times daily.   traMADol 50 MG tablet Commonly known as: Ultram Take 1-2 tablets (50-100 mg total) by mouth every 6 (six) hours as needed.   tretinoin 0.025 % cream Commonly known as: RETIN-A Apply 1 application topically at bedtime.   venlafaxine XR 150 MG 24 hr capsule Commonly known as: EFFEXOR-XR Take 1 capsule (150 mg total) by mouth  daily. Take along with 75 mg cap for total of 225mg  per day   venlafaxine XR 75 MG 24 hr capsule Commonly known as: EFFEXOR-XR Take 1 capsule (75 mg total) by mouth daily. Take along with 150 mg cap for total of 225 mg daily   Xhance 93 MCG/ACT Exhu Generic drug: Fluticasone Propionate Place 1 spray into the nose in the morning and at bedtime.        Signature:  Chesley Mires, MD [place] Pager - [phone] 10/24/2021, 2:02 PM

## 2021-10-24 NOTE — Patient Instructions (Signed)
Will arrange for home sleep study Will call to arrange for follow up after sleep study reviewed  

## 2021-10-26 ENCOUNTER — Ambulatory Visit (HOSPITAL_COMMUNITY): Payer: 59 | Admitting: Psychiatry

## 2021-11-07 NOTE — Progress Notes (Signed)
Virtual Visit via Video Note  I connected with Gabriel [name] on 11/14/21 at 11:30 AM EDT by a video enabled telemedicine application and verified that I am speaking with the correct person using two identifiers.  Location: Patient: home Provider: office Persons participated in the visit- patient, provider    I discussed the limitations of evaluation and management by telemedicine and the availability of in person appointments. The patient expressed understanding and agreed to proceed.     I discussed the assessment and treatment plan with the patient. The patient was provided an opportunity to ask questions and all were answered. The patient agreed with the plan and demonstrated an understanding of the instructions.   The patient was advised to call back or seek an in-person evaluation if the symptoms worsen or if the condition fails to improve as anticipated.  I provided 12 minutes of non-face-to-face time during this encounter.   [name], MD    [facility] MD/PA/NP OP Progress Note  11/14/2021 11:57 AM Gabriel [name]  MRN:  [id number]  Chief Complaint:  Chief Complaint  Patient presents with   Depression   Follow-up   HPI:  This is a follow-up appointment for depression and anxiety.  He states that he has less pain, and feels good today.  He tries to relax and take it easy.  He tends to feel more anxious and jittery when he struggles as he wants to help his family as he used to.  He agrees to find thing he can do to help his family.  He has been working on breathing exercises.  He does it numerous times during the day.  He reports good relationship with his family.  He is staying there together 5 days a week.  He occasionally wants to stay to himself and isolate.  He continues to struggle with sleep.  He denies change in appetite.  He denies SI.  He feels comfortable to stay on the current medication regimen as it is.    Daily routine: takes a walk with his wife a few times  per week Employment: unemployed. 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 He was raised by his mother. He reports good relationship with her, who taught him "love and respect." He has fair relationship with his father    Visit Diagnosis:    ICD-10-CM   1. MDD (major depressive disorder), single episode, mild ([place])  F32.0     2. Anxiety  F41.9     3. Insomnia, unspecified type  G47.00       Past Psychiatric History: Please see initial evaluation for full details. I have reviewed the history. No updates at this time.     Past Medical History:  Past Medical History:  Diagnosis Date   Anxiety    Carpal tunnel syndrome 2018   Depression    Depression    Phreesia 05/15/2020   Elevated LFTs    Heart murmur    Insomnia    Seasonal allergies    Vitamin D deficiency     Past Surgical History:  Procedure Laterality Date   NO PAST SURGERIES      Family Psychiatric History: Please see initial evaluation for full details. I have reviewed the history. No updates at this time.     Family History:  Family History  Problem Relation Age of Onset   Diabetes Father    Hypertension Father    Hypertension Mother    Hypertension Maternal Grandfather    Diabetes Maternal Grandfather    Cancer Paternal Grandmother        type unknown   Hypertension Paternal Grandmother    Cancer Paternal Grandfather        type unknown   Hypertension Paternal Grandfather     Social History:  Social History   Socioeconomic History   Marital status: Married    Spouse name: Not on file   Number of children: 2   Years of education: 12   Highest education level: High school graduate  Occupational History   Occupation: not employed  Tobacco Use   Smoking status: Never   Smokeless tobacco: Never  Vaping Use   Vaping  Use: Never used  Substance and Sexual Activity   Alcohol use: No    Alcohol/week: 0.0 standard drinks of alcohol   Drug use: No   Sexual activity: Yes    Birth control/protection: None  Other Topics Concern   Not on file  Social History Narrative   He lives with wife and two children.   Unemployed at this present time   Right handed   One story home   Highest level of education:  12th grade   Social Determinants of Health   Financial Resource Strain: Medium Risk (09/08/2017)   Overall Financial Resource Strain (CARDIA)    Difficulty of Paying Living Expenses: Somewhat hard  Food Insecurity: No Food Insecurity (09/08/2017)   Hunger Vital Sign    Worried About Running Out of Food in the Last Year: Never true    Ran Out of Food in the Last Year: Never true  Transportation Needs: No Transportation Needs (09/08/2017)   PRAPARE - Administrator, Civil Service (Medical): No    Lack of Transportation (Non-Medical): No  Physical Activity: Not on file  Stress: Stress Concern Present (09/08/2017)   [employer] of Occupational Health - Occupational Stress Questionnaire    Feeling of Stress : Very much  Social Connections: Not on file    Allergies: No Known Allergies  Metabolic Disorder Labs: Lab Results  Component Value Date   HGBA1C 6.3 (H) 09/17/2021   MPG 137 05/27/2019   MPG 137 12/29/2018   No results found for: "PROLACTIN" Lab Results  Component Value Date   CHOL 190 09/17/2021   TRIG 68 09/17/2021   HDL 45 09/17/2021   CHOLHDL 4.2 09/17/2021   VLDL 10 12/04/2016   LDLCALC 132 (H) 09/17/2021   LDLCALC 115 (H) 07/20/2020   Lab Results  Component Value Date   TSH 1.790 09/17/2021   TSH 2.450 07/20/2020    Therapeutic Level Labs: No results found for: "LITHIUM" No results found for: "VALPROATE" No results found for: "CBMZ"  Current Medications: Current Outpatient Medications  Medication Sig Dispense Refill   Brexpiprazole (REXULTI) 3 MG TABS  Take 1 tablet (3 mg total) by mouth daily. 90 tablet 0   fluocinonide ointment (LIDEX) 0.05 % Apply twice daily to back for 2 weeks, then daily for 2 weeks, then every other day for 2 weeks 60 g 3   Fluticasone Propionate (XHANCE) 93 MCG/ACT EXHU Place 1 spray into the nose in the morning and at bedtime. 16 mL 3   hydrocortisone 2.5 % cream APPLY TO FACE 2 TIMES DAILY FOR 3 WEEKS, THEN DAILY FOR 3 WEEKS, THEN EVERY OTHER DAY FOR 3 WEEKS  THEN STOP. REPEAT AS NEEDED. 30 g 3   ibuprofen (ADVIL) 800 MG tablet TAKE ONE TABLET BY MOUTH AT ONSET OF SEVERE HEADACHE WITH IMITREX TABLET. MAY REPEAT ONCE AFTER 8 HOURS FOR PERSISTENT HEADACHE 30 tablet 0   ketoconazole (NIZORAL) 2 % shampoo Apply to face twice weekly and back daily x 1 wk, then twice weekly. 120 mL 11   loratadine (CLARITIN) 10 MG tablet Take 1 tablet (10 mg total) by mouth daily. 90 tablet 3   mirtazapine (REMERON) 30 MG tablet Take 1 tablet (30 mg total) by mouth at bedtime. 90 tablet 0   sodium chloride (OCEAN) 0.65 % nasal spray PLACE 1 SPRAY INTO THE NOSE AS NEEDED FOR CONGESTION. 30 mL 12   topiramate (TOPAMAX) 100 MG tablet Take 1 tablet (100 mg total) by mouth 2 (two) times daily. 180 tablet 1   traMADol (ULTRAM) 50 MG tablet Take 1-2 tablets (50-100 mg total) by mouth every 6 (six) hours as needed. 180 tablet 5   tretinoin (RETIN-A) 0.025 % cream Apply 1 application topically at bedtime.     venlafaxine XR (EFFEXOR-XR) 150 MG 24 hr capsule Take 1 capsule (150 mg total) by mouth daily. Take along with 75 mg cap for total of 225mg  per day 90 capsule 0   venlafaxine XR (EFFEXOR-XR) 75 MG 24 hr capsule Take 1 capsule (75 mg total) by mouth daily. Take along with 150 mg cap for total of 225 mg daily 90 capsule 1   No current facility-administered medications for this visit.     Musculoskeletal: Strength & Muscle Tone:  N/A Gait & Station:  N/A Patient leans: N/A  Psychiatric Specialty Exam: Review of Systems   Psychiatric/Behavioral:  Positive for dysphoric mood and sleep disturbance. Negative for agitation, behavioral problems, confusion, decreased concentration, hallucinations, self-injury and suicidal ideas. The patient is nervous/anxious. The patient is not hyperactive.   All other systems reviewed and are negative.   There were no vitals taken for this visit.There is no height or weight on file to calculate BMI.  General Appearance: Fairly Groomed  Eye Contact:  Good  Speech:  Clear and Coherent  Volume:  Normal  Mood:   good today  Affect:  Appropriate, Congruent, and calm  Thought Process:  Coherent  Orientation:  Full (Time, Place, and Person)  Thought Content: Logical   Suicidal Thoughts:  No  Homicidal Thoughts:  No  Memory:  Immediate;   Good  Judgement:  Good  Insight:  Good  Psychomotor Activity:  Normal  Concentration:  Concentration: Good and Attention Span: Good  Recall:  Good  Fund of Knowledge: Good  Language: Good  Akathisia:  No  Handed:  Right  AIMS (if indicated): not done  Assets:  Communication Skills Desire for Improvement  ADL's:  Intact  Cognition: WNL  Sleep:  Poor   Screenings: GAD-7    Flowsheet Row Counselor [facility] Counselor from 08/17/2021 in [facility] Counselor from 07/03/2021 in [facility] Video Visit from 07/11/2020 in [facility] Office Visit from 12/29/2018 in [facility]  Total GAD-7 Score 10 10 14 15 21       PHQ2-9    Flowsheet Row Office Visit from 08/31/2021 in [facility] Counselor from 08/30/2021 in [facility] Counselor from 08[facility]17/2021 in [facility] Office Visit from 08/09/2021 in [facility] Video Visit from 07/09/2021 in [facility]  PHQ-2 Total Score 2 2 2 2 2   PHQ-9 Total Score 9 8 11 8 9       Flowsheet Row Counselor from 11/03/2020 in [place] Video Visit from 10/26/2020 in [place] Video Visit from 09/20/2020 in [place] No Risk No Risk No Risk        Assessment and Plan:  Gabriel [name] is a 46 y.o. year old male with a history of depression, bilateral carpel tunnel syndrome, who presents for follow up appointment for below.    1. MDD (major depressive disorder), single episode, mild ([place]) 2. Anxiety There has been overall improvement in depressive symptoms and anxiety since the last visit. Psychosocial stressors includes unemployment, loss of his maternal aunt, migraine, and pain secondary to carpal tunnel syndrome.  Will continue venlafaxine, mirtazapine to target depression and anxiety.  Will continue rexulti for depression.  Explored his value and the way he can take action in line with his value.  He will continue to see Ms. Bynum for therapy.   3. Insomnia, unspecified type He continues to report insomnia and fatigue.  Referral was made for evaluation of sleep apnea; he will get a home sleep study.     Plan  Continue venlafaxine 225 mg (150 mg + 75 mg) daily Continue mirtazapine 30 mg at night  Continue Rexulti 3 mg at night - monitor drowsiness  Next appointment: 8/30 at 11 AM for 30 mins, , video [email or web address].    - on tramadol , [place] 600 Mcg Film  - on topiramate for migraine   Past trials of medication: sertraline, fluoxetine, mirtazapine, nortriptyline for carpel tunnel syndrome, bupropion (drowsiness), quetiapine (drowsiness), Abilify ("off balance"), Trazodone, Ambien (limited benefit), lorazepam (worsening in anxiety, drowsiness) , temazepam, Lunesta            Collaboration of Care: Collaboration of Care: Other  N/A  Patient/Guardian was advised Release of Information must be obtained prior to any record release in order to collaborate their care with an outside provider. Patient/Guardian was advised if they have not already done so to contact the registration department to sign all necessary forms in order for [place] to release information regarding their care.   Consent: Patient/Guardian gives verbal consent for treatment and assignment of benefits for services provided during this visit. Patient/Guardian expressed understanding and agreed to proceed.    [name], MD 11/14/2021, 11:57 AM

## 2021-11-14 ENCOUNTER — Telehealth (INDEPENDENT_AMBULATORY_CARE_PROVIDER_SITE_OTHER): Payer: 59 | Admitting: Psychiatry

## 2021-11-14 ENCOUNTER — Encounter: Payer: Self-pay | Admitting: Psychiatry

## 2021-11-14 ENCOUNTER — Other Ambulatory Visit (HOSPITAL_COMMUNITY): Payer: Self-pay

## 2021-11-14 DIAGNOSIS — F419 Anxiety disorder, unspecified: Secondary | ICD-10-CM | POA: Diagnosis not present

## 2021-11-14 DIAGNOSIS — G4700 Insomnia, unspecified: Secondary | ICD-10-CM | POA: Diagnosis not present

## 2021-11-14 DIAGNOSIS — F320 Major depressive disorder, single episode, mild: Secondary | ICD-10-CM | POA: Diagnosis not present

## 2021-11-14 MED ORDER — REXULTI 3 MG PO TABS
3.0000 mg | ORAL_TABLET | Freq: Every day | ORAL | 0 refills | Status: DC
  Filled 2021-11-14: qty 90, 90d supply, fill #0

## 2021-11-14 MED ORDER — MIRTAZAPINE 30 MG PO TABS
30.0000 mg | ORAL_TABLET | Freq: Every day | ORAL | 0 refills | Status: DC
  Filled 2021-11-14: qty 90, 90d supply, fill #0

## 2021-11-14 NOTE — Patient Instructions (Signed)
Continue venlafaxine 225 mg (150 mg + 75 mg) daily Continue mirtazapine 30 mg at night  Continue Rexulti 3 mg at night - monitor drowsiness  Next appointment: 8/30 at 11 AM

## 2021-11-19 ENCOUNTER — Other Ambulatory Visit: Payer: Self-pay | Admitting: Psychiatry

## 2021-11-19 DIAGNOSIS — F320 Major depressive disorder, single episode, mild: Secondary | ICD-10-CM

## 2021-11-19 DIAGNOSIS — F419 Anxiety disorder, unspecified: Secondary | ICD-10-CM

## 2021-11-20 ENCOUNTER — Other Ambulatory Visit (HOSPITAL_COMMUNITY): Payer: Self-pay

## 2021-11-20 ENCOUNTER — Ambulatory Visit (INDEPENDENT_AMBULATORY_CARE_PROVIDER_SITE_OTHER): Payer: 59 | Admitting: Psychiatry

## 2021-11-20 DIAGNOSIS — F320 Major depressive disorder, single episode, mild: Secondary | ICD-10-CM

## 2021-11-20 MED ORDER — VENLAFAXINE HCL ER 150 MG PO CP24
150.0000 mg | ORAL_CAPSULE | Freq: Every day | ORAL | 1 refills | Status: DC
  Filled 2021-11-20: qty 90, 90d supply, fill #0
  Filled 2022-02-23: qty 90, 90d supply, fill #1

## 2021-11-20 NOTE — Progress Notes (Signed)
Virtual Visit via Telephone Note  I connected with Gabriel [name] on 11/20/21 at 4:12 PM EDT  by telephone and verified that I am speaking with the correct person using two identifiers.  Location: Patient: Home Provider: [facility] office    I discussed the limitations, risks, security and privacy concerns of performing an evaluation and management service by telephone and the availability of in person appointments. I also discussed with the patient that there may be a patient responsible charge related to this service. The patient expressed understanding and agreed to proceed.   I provided 33 minutes of non-face-to-face time during this encounter.   [name], LCSW  THERAPIST PROGRESS NOTE    Session Time:  Tuesday 11/20/2021  4:12 PM - 4:45 PM   Participation Level: Active  Treatment Goals addressed:  alleviate depressive symptoms and resume normal involvement and activity and social activity, learn and implement cognitive and behavioral strategies to overcome depression,   Progress in treatment:  progressing    Interventions: CBT and Supportive  Summary: Gabriel [name] is a 46 y.o. male who is referred for services by PCP Dr. Syliva Overman due to patient experiencing symptoms of depression. He denies any psychiatric hospitalizations, He saw therapist Meredith Staggers S  [place] in [place] for a few months. Patient reports symptoms began when he lost his job about a year ago. Per his report, he was fired after he complained about race discrimination. 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 He states now feeling as though he failed his family. He states not wanting to be around anyone and having difficulty engaging with his wife and children. He fears losing his family.  He reports ruminating thoughts about job and the past, excessive worry  depressed mood, tearfulness, anxiety, isolative behaviors, poor motivation, poor concentration, irritability, sleep difficulty, thoughts and feelings of hopelessness and worthlessness. He denies any suicidal ideations.  Patient last was seen via virtual visit about 2 months ago. He continues to experience mild to moderate symptoms of depression.  He reports staying engaged with family by eating meals with them almost every night.  He also says that he continues to set the table.  He continues to go for walks with his wife.  He continues to experience anxiety a but reports using breathing techniques have been very helpful.  He still reports periods of isolating from family often triggered by negative thoughts about self related to not helping out/not being the man of the house/not being the provider.       SI/HI .Nowithout intent/plan  Therapist Response:  reviewed symptoms, praised and reinforced patient's efforts to engage with family, discussed effects, assisted patient identify connection between thoughts/mood/behavior, used cognitive defusion to assist patient cope with ruminating negative thoughts, assisted patient began to try to identify value congruent behavior to increase behavioral activation, developed plan with patient to prepare a list of possible goals/activities in preparation for next session  Diagnosis: Axis I: MDD, single episode, severe    Axis II: No diagnosis  Collaboration of Care: Psychiatrist AEB patient working with psychiatrist Dr. Vanetta Shawl  Patient/Guardian was advised Release of Information must be obtained prior to any record release in order to collaborate their care with an outside provider. Patient/Guardian was advised if they have not already done so to contact the registration department to sign all  necessary forms in order for [place] to release information regarding their care.   Consent: Patient/Guardian gives verbal consent for treatment and assignment of benefits for  services provided during this visit. Patient/Guardian expressed understanding and agreed to proceed.   [name], LCSW 11/20/2021

## 2021-11-30 ENCOUNTER — Encounter: Payer: Self-pay | Admitting: Family Medicine

## 2021-11-30 ENCOUNTER — Ambulatory Visit: Payer: 59 | Admitting: Family Medicine

## 2021-11-30 VITALS — BP 130/80 | HR 55 | Ht 72.0 in | Wt 202.1 lb

## 2021-11-30 DIAGNOSIS — E785 Hyperlipidemia, unspecified: Secondary | ICD-10-CM | POA: Diagnosis not present

## 2021-11-30 DIAGNOSIS — J3089 Other allergic rhinitis: Secondary | ICD-10-CM

## 2021-11-30 DIAGNOSIS — R7303 Prediabetes: Secondary | ICD-10-CM

## 2021-11-30 DIAGNOSIS — F322 Major depressive disorder, single episode, severe without psychotic features: Secondary | ICD-10-CM | POA: Diagnosis not present

## 2021-11-30 DIAGNOSIS — E663 Overweight: Secondary | ICD-10-CM | POA: Diagnosis not present

## 2021-11-30 DIAGNOSIS — Z125 Encounter for screening for malignant neoplasm of prostate: Secondary | ICD-10-CM | POA: Diagnosis not present

## 2021-11-30 DIAGNOSIS — E559 Vitamin D deficiency, unspecified: Secondary | ICD-10-CM

## 2021-11-30 DIAGNOSIS — L219 Seborrheic dermatitis, unspecified: Secondary | ICD-10-CM

## 2021-11-30 DIAGNOSIS — F5104 Psychophysiologic insomnia: Secondary | ICD-10-CM | POA: Diagnosis not present

## 2021-11-30 DIAGNOSIS — R7302 Impaired glucose tolerance (oral): Secondary | ICD-10-CM | POA: Diagnosis not present

## 2021-11-30 DIAGNOSIS — M5416 Radiculopathy, lumbar region: Secondary | ICD-10-CM

## 2021-11-30 DIAGNOSIS — G43011 Migraine without aura, intractable, with status migrainosus: Secondary | ICD-10-CM

## 2021-11-30 NOTE — Patient Instructions (Addendum)
Annual exam with flu vaccine in early November, call if you need me sooner  Blood pressure is normal   Congrats on weight loss, keep eating mainly veges and fruits and avoid sweets and sugar  Start 15 mins once to twice daily walking  Go [place] andhave fum like [place]  Deep breathing for anxiety  Love yourself!  Fasting lipid, hBA1C, PSA and CBC 10/24 or after  Thanks for choosing [facility], we consider it a privelige to serve you.

## 2021-12-02 ENCOUNTER — Encounter: Payer: Self-pay | Admitting: Family Medicine

## 2021-12-02 NOTE — Assessment & Plan Note (Signed)
Controlled, no change in medication  

## 2021-12-02 NOTE — Assessment & Plan Note (Signed)
Hyperlipidemia:Low fat diet discussed and encouraged.   Lipid Panel  Lab Results  Component Value Date   CHOL 190 09/17/2021   HDL 45 09/17/2021   LDLCALC 132 (H) 09/17/2021   TRIG 68 09/17/2021   CHOLHDL 4.2 09/17/2021     Updated lab needed at/ before next visit.

## 2021-12-02 NOTE — Progress Notes (Signed)
Gabriel [name]     MRN: [id number]      DOB: [birth year]   HPI Gabriel [name] is here for follow up and re-evaluation of chronic medical conditions, medication management and review of any available recent lab and radiology data.  Preventive health is updated, specifically  Cancer screening and Immunization.   Questions or concerns regarding consultations or procedures which the PT has had in the interim are  addressed. The PT denies any adverse reactions to current medications since the last visit.  There are no new concerns.  There are no specific complaints   ROS Denies recent fever or chills. Denies sinus pressure, nasal congestion, ear pain or sore throat. Denies chest congestion, productive cough or wheezing. Denies chest pains, palpitations and leg swelling Denies abdominal pain, nausea, vomiting,diarrhea or constipation.   Denies dysuria, frequency, hesitancy or incontinence. Denies uncontrolled joint pain, swelling and limitation in mobility. Denies  frequent headaches, seizures, numbness, or tingling. C/o  depression and  anxiety  and insomnia. Denies skin break down or rash.   PE  BP 130/80   Pulse (!) 55   Ht 6' (1.829 m)   Wt 202 lb 1.9 oz (91.7 kg)   SpO2 99%   BMI 27.41 kg/m   Patient alert and oriented and in no cardiopulmonary distress.  HEENT: No facial asymmetry, EOMI,     Neck supple .  Chest: Clear to auscultation bilaterally.  CVS: S1, S2 no murmurs, no S3.Regular rate.  ABD: Soft non tender.   Ext: No edema  MS: Adequate ROM spine, shoulders, hips and knees.  Skin: Intact, no ulcerations or rash noted.  Psych: Good eye contact, blunted affect. Memory intact mildly anxious and  depressed appearing.  CNS: CN 2-12 intact, power,  normal throughout.no focal deficits noted.   Assessment & Plan  Overweight (BMI 25.0-29.9) Improved, he is congratulated on this  Patient re-educated about  the importance of commitment to a  minimum of 150  minutes of exercise per week as able.  The importance of healthy food choices with portion control discussed, as well as eating regularly and within a 12 hour window most days. The need to choose "clean , green" food 50 to 75% of the time is discussed, as well as to make water the primary drink and set a goal of 64 ounces water daily.       11/30/2021    9:19 AM 10/24/2021    1:36 PM 08/31/2021    9:30 AM  Weight /BMI  Weight 202 lb 1.9 oz 206 lb 9.6 oz 211 lb 0.6 oz  Height 6' (1.829 m) 6' (1.829 m) 6' (1.829 m)  BMI 27.41 kg/m2 28.02 kg/m2 28.62 kg/m2      Depression, major, single episode, severe ([facility]) Inadequately treated, managed by Psych, not suicidal or homicidal, still primarily stays at home and remains socially withdrawn, wants to change this  Dyslipidemia, goal LDL below 100 Hyperlipidemia:Low fat diet discussed and encouraged.   Lipid Panel  Lab Results  Component Value Date   CHOL 190 09/17/2021   HDL 45 09/17/2021   LDLCALC 132 (H) 09/17/2021   TRIG 68 09/17/2021   CHOLHDL 4.2 09/17/2021     Updated lab needed at/ before next visit.   Insomnia Controlled, no change in medication Managed by Psych   Prediabetes Patient educated about the importance of limiting  Carbohydrate intake , the need to commit to daily physical activity for a minimum of 30 minutes , and to commit weight  loss. The fact that changes in all these areas will reduce or eliminate all together the development of diabetes is stressed.  Updated lab needed at/ before next visit.      Latest Ref Rng & Units 09/17/2021   10:24 AM 03/02/2021    9:59 AM 07/20/2020   10:13 AM 01/31/2020    2:01 PM 06/08/2019   11:39 AM  Diabetic Labs  HbA1c 4.8 - 5.6 % 6.3  6.0   6.0    Chol 100 - 199 mg/dL 245   809     HDL >98 mg/dL 45   45     Calc LDL 0 - 99 mg/dL 338   250     Triglycerides 0 - 149 mg/dL 68   54     Creatinine 0.76 - 1.27 mg/dL 5.39   7.67   3.41   GFR >60.00 mL/min     87.22        11/30/2021    9:59 AM 11/30/2021    9:19 AM 10/24/2021    1:36 PM 08/31/2021    9:30 AM 08/09/2021   11:09 AM 03/02/2021    9:06 AM 09/18/2020    9:03 AM  BP/Weight  Systolic BP 130 142 140 116  119 125  Diastolic BP 80 80 82 75  75 84  Wt. (Lbs)  202.12 206.6 211.04  212.08 210.8  BMI  27.41 kg/m2 28.02 kg/m2 28.62 kg/m2  28.76 kg/m2 28.59 kg/m2     Information is confidential and restricted. Go to Review Flowsheets to unlock data.       No data to display            Seborrheic dermatitis Controlled, no change in medication   Migraine Improved, no longer taks prophylactic med with headaches approx every 2 to 3 weeks  Perennial allergic rhinitis Controlled, no change in medication   Right lumbar radiculopathy Minimal flares, discussed with wife , uses probabayl 2 tramadol tabs per month at her encouragement

## 2021-12-02 NOTE — Assessment & Plan Note (Signed)
Controlled, no change in medication Managed by Psych 

## 2021-12-02 NOTE — Assessment & Plan Note (Signed)
Patient educated about the importance of limiting  Carbohydrate intake , the need to commit to daily physical activity for a minimum of 30 minutes , and to commit weight loss. The fact that changes in all these areas will reduce or eliminate all together the development of diabetes is stressed.  Updated lab needed at/ before next visit.      Latest Ref Rng & Units 09/17/2021   10:24 AM 03/02/2021    9:59 AM 07/20/2020   10:13 AM 01/31/2020    2:01 PM 06/08/2019   11:39 AM  Diabetic Labs  HbA1c 4.8 - 5.6 % 6.3  6.0   6.0    Chol 100 - 199 mg/dL 412   878     HDL >67 mg/dL 45   45     Calc LDL 0 - 99 mg/dL 672   094     Triglycerides 0 - 149 mg/dL 68   54     Creatinine 0.76 - 1.27 mg/dL 7.09   6.28   3.66   GFR >60.00 mL/min     87.22       11/30/2021    9:59 AM 11/30/2021    9:19 AM 10/24/2021    1:36 PM 08/31/2021    9:30 AM 08/09/2021   11:09 AM 03/02/2021    9:06 AM 09/18/2020    9:03 AM  BP/Weight  Systolic BP 130 142 140 116  119 125  Diastolic BP 80 80 82 75  75 84  Wt. (Lbs)  202.12 206.6 211.04  212.08 210.8  BMI  27.41 kg/m2 28.02 kg/m2 28.62 kg/m2  28.76 kg/m2 28.59 kg/m2     Information is confidential and restricted. Go to Review Flowsheets to unlock data.       No data to display

## 2021-12-02 NOTE — Assessment & Plan Note (Signed)
Improved, no longer taks prophylactic med with headaches approx every 2 to 3 weeks

## 2021-12-02 NOTE — Assessment & Plan Note (Addendum)
Minimal flares, discussed with wife , uses probabayl 2 tramadol tabs per month at her encouragement

## 2021-12-02 NOTE — Assessment & Plan Note (Signed)
Improved, he is congratulated on this  Patient re-educated about  the importance of commitment to a  minimum of 150 minutes of exercise per week as able.  The importance of healthy food choices with portion control discussed, as well as eating regularly and within a 12 hour window most days. The need to choose "clean , green" food 50 to 75% of the time is discussed, as well as to make water the primary drink and set a goal of 64 ounces water daily.       11/30/2021    9:19 AM 10/24/2021    1:36 PM 08/31/2021    9:30 AM  Weight /BMI  Weight 202 lb 1.9 oz 206 lb 9.6 oz 211 lb 0.6 oz  Height 6' (1.829 m) 6' (1.829 m) 6' (1.829 m)  BMI 27.41 kg/m2 28.02 kg/m2 28.62 kg/m2

## 2021-12-02 NOTE — Assessment & Plan Note (Signed)
Inadequately treated, managed by Psych, not suicidal or homicidal, still primarily stays at home and remains socially withdrawn, wants to change this

## 2021-12-04 ENCOUNTER — Ambulatory Visit (HOSPITAL_COMMUNITY): Payer: 59 | Admitting: Psychiatry

## 2021-12-20 ENCOUNTER — Ambulatory Visit (INDEPENDENT_AMBULATORY_CARE_PROVIDER_SITE_OTHER): Payer: 59 | Admitting: Psychiatry

## 2021-12-20 DIAGNOSIS — F320 Major depressive disorder, single episode, mild: Secondary | ICD-10-CM | POA: Diagnosis not present
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
## 2022-01-07 NOTE — Progress Notes (Unsigned)
Virtual Visit via Video Note  I connected with Gabriel [name] on 01/09/22 at 11:00 AM EDT by a video enabled telemedicine application and verified that I am speaking with the correct person using two identifiers.  Location: Patient: home Provider: office Persons participated in the visit- patient, provider    I discussed the limitations of evaluation and management by telemedicine and the availability of in person appointments. The patient expressed understanding and agreed to proceed.    I discussed the assessment and treatment plan with the patient. The patient was provided an opportunity to ask questions and all were answered. The patient agreed with the plan and demonstrated an understanding of the instructions.   The patient was advised to call back or seek an in-person evaluation if the symptoms worsen or if the condition fails to improve as anticipated.  I provided 17 minutes of non-face-to-face time during this encounter.   Gabriel Clay, MD    [facility] MD/PA/NP OP Progress Note  01/09/2022 11:36 AM Gabriel [name]  MRN:  [id number]  Chief Complaint:  Chief Complaint  Patient presents with   Follow-up   Depression   HPI:  This is a follow-up appointment for depression.  He states that he has been doing on the same.  He tries to take it easy.  He tries to eat with his family 3-4 times a week, and takes a walk a few times per week.  His mood is down at times, and it has been hard to relax, although he denies any panic attacks.  He has initial and middle insomnia.  He feels fatigued.  He agrees to keep his upcoming appointment for evaluation of sleep apnea.  He denies change in appetite.  He denies SI.  He takes medication every day.  He is not interested in [facility] at this time, and would like to see how it goes with his sleep evaluation first.    Daily routine: takes a walk with his wife a few times per week Employment: unemployed. 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 He was raised by his mother. He reports good relationship with her, who taught him "love and respect." He has fair relationship with his father  Visit Diagnosis:    ICD-10-CM   1. Insomnia, unspecified type  G47.00     2. MDD (major depressive disorder), single episode, mild (HCC)  F32.0 venlafaxine XR (EFFEXOR-XR) 75 MG 24 hr capsule    3. Anxiety  F41.9 venlafaxine XR (EFFEXOR-XR) 75 MG 24 hr capsule      Past Psychiatric History: Please see initial evaluation for full details. I have reviewed the history. No updates at this time.     Past Medical History:  Past Medical History:  Diagnosis Date   Anxiety    Carpal tunnel syndrome 2018   Depression    Depression    Phreesia 05/15/2020   Elevated LFTs    Heart murmur    Insomnia    Seasonal allergies    Vitamin D deficiency     Past Surgical History:  Procedure Laterality Date   NO PAST SURGERIES      Family Psychiatric History: Please see initial evaluation for full details. I have reviewed the history. No updates at this time.     Family History:  Family History  Problem Relation  Age of Onset   Diabetes Father    Hypertension Father    Hypertension Mother    Hypertension Maternal Grandfather    Diabetes Maternal Grandfather    Cancer Paternal Grandmother        type unknown   Hypertension Paternal Grandmother    Cancer Paternal Grandfather        type unknown   Hypertension Paternal Grandfather     Social History:  Social History   Socioeconomic History   Marital status: Married    Spouse name: Not on file   Number of children: 2   Years of education: 12   Highest education level: High school graduate  Occupational History   Occupation: not employed  Tobacco Use   Smoking status: Never   Smokeless tobacco: Never   Vaping Use   Vaping Use: Never used  Substance and Sexual Activity   Alcohol use: No    Alcohol/week: 0.0 standard drinks of alcohol   Drug use: No   Sexual activity: Yes    Birth control/protection: None  Other Topics Concern   Not on file  Social History Narrative   He lives with wife and two children.   Unemployed at this present time   Right handed   One story home   Highest level of education:  12th grade   Social Determinants of Health   Financial Resource Strain: Medium Risk (09/08/2017)   Overall Financial Resource Strain (CARDIA)    Difficulty of Paying Living Expenses: Somewhat hard  Food Insecurity: No Food Insecurity (09/08/2017)   Hunger Vital Sign    Worried About Running Out of Food in the Last Year: Never true    Ran Out of Food in the Last Year: Never true  Transportation Needs: No Transportation Needs (09/08/2017)   PRAPARE - Hydrologist (Medical): No    Lack of Transportation (Non-Medical): No  Physical Activity: Not on file  Stress: Stress Concern Present (09/08/2017)   [place]    Feeling of Stress : Very much  Social Connections: Not on file    Allergies: No Known Allergies  Metabolic Disorder Labs: Lab Results  Component Value Date   HGBA1C 6.3 (H) 09/17/2021   MPG 137 05/27/2019   MPG 137 12/29/2018   No results found for: "PROLACTIN" Lab Results  Component Value Date   CHOL 190 09/17/2021   TRIG 68 09/17/2021   HDL 45 09/17/2021   CHOLHDL 4.2 09/17/2021   VLDL 10 12/04/2016   LDLCALC 132 (H) 09/17/2021   LDLCALC 115 (H) 07/20/2020   Lab Results  Component Value Date   TSH 1.790 09/17/2021   TSH 2.450 07/20/2020    Therapeutic Level Labs: No results found for: "LITHIUM" No results found for: "VALPROATE" No results found for: "CBMZ"  Current Medications: Current Outpatient Medications  Medication Sig Dispense Refill   [START ON  02/18/2022] Brexpiprazole (REXULTI) 3 MG TABS Take 1 tablet (3 mg total) by mouth daily. 90 tablet 1   fluocinonide ointment (LIDEX) 0.05 % Apply twice daily to back for 2 weeks, then daily for 2 weeks, then every other day for 2 weeks 60 g 3   Fluticasone Propionate (XHANCE) 93 MCG/ACT EXHU Place 1 spray into the nose in the morning and at bedtime. 16 mL 3   hydrocortisone 2.5 % cream APPLY TO FACE 2 TIMES DAILY FOR 3 WEEKS, THEN DAILY FOR 3 WEEKS, THEN EVERY OTHER DAY FOR 3  WEEKS THEN STOP. REPEAT AS NEEDED. 30 g 3   ibuprofen (ADVIL) 800 MG tablet TAKE ONE TABLET BY MOUTH AT ONSET OF SEVERE HEADACHE WITH IMITREX TABLET. MAY REPEAT ONCE AFTER 8 HOURS FOR PERSISTENT HEADACHE 30 tablet 0   ketoconazole (NIZORAL) 2 % shampoo Apply to face twice weekly and back daily x 1 wk, then twice weekly. 120 mL 11   loratadine (CLARITIN) 10 MG tablet Take 1 tablet (10 mg total) by mouth daily. 90 tablet 3   [START ON 02/18/2022] mirtazapine (REMERON) 30 MG tablet Take 1 tablet (30 mg total) by mouth at bedtime. 90 tablet 0   sodium chloride (OCEAN) 0.65 % nasal spray PLACE 1 SPRAY INTO THE NOSE AS NEEDED FOR CONGESTION. 30 mL 12   topiramate (TOPAMAX) 100 MG tablet Take 1 tablet (100 mg total) by mouth 2 (two) times daily. 180 tablet 1   traMADol (ULTRAM) 50 MG tablet Take 1-2 tablets (50-100 mg total) by mouth every 6 (six) hours as needed. 180 tablet 5   tretinoin (RETIN-A) 0.025 % cream Apply 1 application topically at bedtime.     venlafaxine XR (EFFEXOR-XR) 150 MG 24 hr capsule Take 1 capsule (150 mg total) by mouth daily. 90 capsule 1   [START ON 02/22/2022] venlafaxine XR (EFFEXOR-XR) 75 MG 24 hr capsule Take 1 capsule (75 mg total) by mouth daily. Total of 225 mg daily. Take along with 150 mg cap 90 capsule 1   No current facility-administered medications for this visit.     Musculoskeletal: Strength & Muscle Tone:  N/A Gait & Station:  N/A Patient leans: N/A  Psychiatric Specialty Exam: Review of  Systems  Psychiatric/Behavioral:  Positive for decreased concentration, dysphoric mood and sleep disturbance. Negative for agitation, behavioral problems, confusion, hallucinations, self-injury and suicidal ideas. The patient is nervous/anxious. The patient is not hyperactive.   All other systems reviewed and are negative.   There were no vitals taken for this visit.There is no height or weight on file to calculate BMI.  General Appearance: Fairly Groomed  Eye Contact:  Good  Speech:  Clear and Coherent  Volume:  Normal  Mood:   same  Affect:  Appropriate, Congruent, and down  Thought Process:  Coherent  Orientation:  Full (Time, Place, and Person)  Thought Content: Logical   Suicidal Thoughts:  No  Homicidal Thoughts:  No  Memory:  Immediate;   Good  Judgement:  Good  Insight:  Present  Psychomotor Activity:  Normal  Concentration:  Concentration: Good and Attention Span: Good  Recall:  Good  Fund of Knowledge: Good  Language: Good  Akathisia:  No  Handed:  Right  AIMS (if indicated): not done  Assets:  Communication Skills Desire for Improvement  ADL's:  Intact  Cognition: WNL  Sleep:  Poor   Screenings: GAD-7    Flowsheet Row Counselor from [facility] Counselor from 08/17/2021 [facility] Counselor from 07/03/2021 in [facility] Video Visit from 07/11/2020 in [facility] Office Visit from 12/29/2018 in [facility]  Total GAD-7 Score 10 10 14 15 21       PHQ2-9    Flowsheet Row Counselor from 12/20/2021 in [facility] Office Visit from 11/30/2021 in [facility] Office Visit from 08/31/2021 in [facility] Counselor from 08/30/2021 in [facility] Counselor from [facility]17[facility]2021 [facility]  PHQ-2 Total Score 2  2 2 2 2   PHQ-9 Total Score 10 11 9 8 11       Flowsheet Row Counselor from 12/20/2021 in [facility] Counselor from 11/03/2020 in [facility] Video Visit from 10/26/2020 in [facility]  C-SSRS RISK CATEGORY No Risk No Risk No Risk        Assessment and Plan:  Ladislav Caselli is a 46 y.o. year old male with a history of depression, bilateral carpel tunnel syndrome, who presents for follow up appointment for below.   1. MDD (major depressive disorder), single episode, mild (HCC) 2. Anxiety He continues to report depressive symptoms with prominent fatigue and anxiety since the last visit. Psychosocial stressors includes unemployment, loss of his maternal aunt, migraine, and pain secondary to carpal tunnel syndrome.  He is in the process of getting evaluation of sleep apnea; will hold of changing his medication to address this first.  Will continue venlafaxine, mirtazapine to target depression and anxiety.  Will continue rexulti for depression.  Noted that although it has been discussed at least a few times to consider TMS, he is not interested in this option.  He will greatly benefit from CBT; will continue to see Ms. Bynum for therapy.   3. Insomnia, unspecified type Unchanged.  He was referred for evaluation of sleep apnea, and is in the process of getting a home sleep study.    Plan  Continue venlafaxine 225 mg (150 mg + 75 mg) daily Continue mirtazapine 30 mg at night  Continue Rexulti 3 mg at night - monitor drowsiness  Next appointment: 11/22 at 8:20 , video [email or web address].    - on tramadol , [place] 600 Mcg Film  - on topiramate for migraine   Past trials of medication: sertraline, fluoxetine, mirtazapine, nortriptyline for carpel tunnel syndrome, bupropion (drowsiness), quetiapine (drowsiness), Abilify ("off balance"),  Trazodone, Ambien (limited benefit), lorazepam (worsening in anxiety, drowsiness) , temazepam, Lunesta              Collaboration of Care: Collaboration of Care: Other reviewed notes in Epic  Patient/Guardian was advised Release of Information must be obtained prior to any record release in order to collaborate their care with an outside provider. Patient/Guardian was advised if they have not already done so to contact the registration department to sign all necessary forms in order for 49 to release information regarding their care.   Consent: Patient/Guardian gives verbal consent for treatment and assignment of benefits for services provided during this visit. Patient/Guardian expressed understanding and agreed to proceed.    12/22, MD 01/09/2022, 11:36 AM

## 2022-01-09 ENCOUNTER — Telehealth (INDEPENDENT_AMBULATORY_CARE_PROVIDER_SITE_OTHER): Payer: 59 | Admitting: Psychiatry

## 2022-01-09 ENCOUNTER — Encounter: Payer: Self-pay | Admitting: Psychiatry

## 2022-01-09 ENCOUNTER — Other Ambulatory Visit (HOSPITAL_COMMUNITY): Payer: Self-pay

## 2022-01-09 DIAGNOSIS — F419 Anxiety disorder, unspecified: Secondary | ICD-10-CM | POA: Diagnosis not present

## 2022-01-09 DIAGNOSIS — G4700 Insomnia, unspecified: Secondary | ICD-10-CM

## 2022-01-09 DIAGNOSIS — F320 Major depressive disorder, single episode, mild: Secondary | ICD-10-CM

## 2022-01-09 MED ORDER — REXULTI 3 MG PO TABS
3.0000 mg | ORAL_TABLET | Freq: Every day | ORAL | 1 refills | Status: DC
Start: 2022-02-18 — End: 2022-12-04
  Filled 2022-01-09 – 2022-02-23 (×2): qty 90, 90d supply, fill #0
  Filled 2022-05-31: qty 90, 90d supply, fill #1

## 2022-01-09 MED ORDER — VENLAFAXINE HCL ER 75 MG PO CP24
75.0000 mg | ORAL_CAPSULE | Freq: Every day | ORAL | 1 refills | Status: DC
  Filled 2022-02-23: qty 90, 90d supply, fill #0
  Filled 2022-05-31: qty 90, 90d supply, fill #1

## 2022-01-09 MED ORDER — MIRTAZAPINE 30 MG PO TABS
30.0000 mg | ORAL_TABLET | Freq: Every day | ORAL | 0 refills | Status: DC
  Filled 2022-01-09 – 2022-02-23 (×2): qty 90, 90d supply, fill #0

## 2022-01-09 NOTE — Patient Instructions (Signed)
Continue venlafaxine 225 mg (150 mg + 75 mg) daily Continue mirtazapine 30 mg at night  Continue Rexulti 3 mg at night   Next appointment: 11/22 at 8:20

## 2022-01-28 ENCOUNTER — Ambulatory Visit: Payer: 59

## 2022-01-28 DIAGNOSIS — R0683 Snoring: Secondary | ICD-10-CM

## 2022-01-30 ENCOUNTER — Ambulatory Visit (INDEPENDENT_AMBULATORY_CARE_PROVIDER_SITE_OTHER): Payer: 59 | Admitting: Psychiatry

## 2022-01-30 ENCOUNTER — Encounter (HOSPITAL_COMMUNITY): Payer: Self-pay

## 2022-01-30 DIAGNOSIS — F320 Major depressive disorder, single episode, mild: Secondary | ICD-10-CM

## 2022-01-30 DIAGNOSIS — F419 Anxiety disorder, unspecified: Secondary | ICD-10-CM

## 2022-01-30 NOTE — Plan of Care (Signed)
  Problem: Anxiety Disorder CCP worry, nervousness, avoidant behaviors, difficulty trusting others Goal: Improve ability to manage stress and anxiety AEB reducing episodes of anxiety (worry & nervousness) from 4 x per week to 1 x per week for 60 days per pt's self-report  Outcome: Initial Goal: STG: Report a decrease in anxiety symptoms as evidenced by an overall reduction in anxiety score by a minimum of 25% on the Generalized Anxiety Disorder Scale for 2 months.   Outcome: Initial

## 2022-01-30 NOTE — Progress Notes (Signed)
Virtual Visit via Video Note  I connected with Gabriel [name] on 01/30/22 at 10:17 AM EDT  by a video enabled telemedicine application and verified that I am speaking with the correct person using two identifiers.  Location: Patient: Home Provider: [place] office    I discussed the limitations of evaluation and management by telemedicine and the availability of in person appointments. The patient expressed understanding and agreed to proceed.  I provided 43 minutes of non-face-to-face time during this encounter.   [name], LCSW THERAPIST PROGRESS NOTE    Session Time:  Wednesday 01/30/2022 10:17 AM -11:00 AM  Participation Level: Active  Treatment Goals addressed:  alleviate depressive symptoms and resume normal involvement and activity and social activity, learn and implement cognitive and behavioral strategies to overcome depression,   Progress in treatment:  progressing    Interventions: CBT and Supportive  Summary: Gabriel [name] is a 46 y.o. male who is referred for services by PCP Dr. Tula Nakayama due to patient experiencing symptoms of depression. He denies any psychiatric hospitalizations, He saw therapist Gwynneth Macleod S  wan in [place] for a few months. Patient reports symptoms began when he lost his job about a year ago. Per his report, he was fired after he complained about race discrimination. 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 He states now feeling as though he failed his family. He states not wanting to be around anyone and having difficulty engaging with his wife and children. He fears losing his family.  He reports ruminating thoughts about job and the past, excessive worry depressed mood, tearfulness, anxiety, isolative behaviors, poor motivation, poor concentration, irritability, sleep difficulty, thoughts  and feelings of hopelessness and worthlessness. He denies any suicidal ideations.  Patient last was seen via virtual visit about 6 to 7 weeks ago. He reports decreased intensity and frequency of symptoms of depression but continued symptoms of anxiety.  He maintains behavioral activation but expresses significant worry along with continued negative thoughts about self.  Patient expresses fear he may lose his family since he cannot contribute financially as he did when he was working.  He reports over hearing wife making a comment about expenses triggered increased worry.  He also expresses frustration that he is not as involved with his family socially and emotionally as he was when he was working.  He reports isolating due to mood swings.  He expresses desire to be able to trust and express his needs/concerns to his wife and others.     SI/HI .Nowithout intent/plan  Therapist Response:  reviewed symptoms, administered PHQ 2 and 9 and GAD-7, discussed results, discussed stressors, facilitated expression of thoughts and feelings, validated feelings, developed treatment plan, obtained patient's permission to electronically sign plan as this was a virtual visit, sent patient plan via [place], began to discuss next steps for treatment, developed plan with patient to begin using a daily mood journal, bring to next session  Diagnosis: Axis I: MDD, single episode, severe    Axis II: No diagnosis  Collaboration of Care: Psychiatrist AEB patient working with psychiatrist Dr. Modesta Messing  Patient/Guardian was advised Release of Information must be obtained prior to any record release in order to collaborate their care with an outside provider. Patient/Guardian was advised if they have not already done so to contact the registration department to sign  all necessary forms in order for [place] to release information regarding their care.   Consent: Patient/Guardian gives verbal consent for treatment and assignment of  benefits for services provided during this visit. Patient/Guardian expressed understanding and agreed to proceed.   [name], LCSW 01/30/2022

## 2022-02-04 ENCOUNTER — Telehealth: Payer: Self-pay | Admitting: Pulmonary Disease

## 2022-02-04 DIAGNOSIS — R0683 Snoring: Secondary | ICD-10-CM

## 2022-02-04 NOTE — Telephone Encounter (Signed)
ATC patient. Left a detailed on cell phone vm (ok per) with results and advised patient to call back for questions/concerns with results.

## 2022-02-04 NOTE — Telephone Encounter (Signed)
HST 01/28/22 >> AHI 2.8, SpO2 low 87%.   Please let him know his home sleep study did not show sleep apnea.  He can schedule a follow up appointment if he is still having trouble with his sleep.

## 2022-02-12 ENCOUNTER — Encounter: Payer: Self-pay | Admitting: Family Medicine

## 2022-02-12 ENCOUNTER — Other Ambulatory Visit (HOSPITAL_COMMUNITY): Payer: Self-pay

## 2022-02-12 ENCOUNTER — Ambulatory Visit: Payer: 59 | Admitting: Family Medicine

## 2022-02-12 VITALS — BP 117/76 | HR 60 | Ht 72.0 in | Wt 197.0 lb

## 2022-02-12 DIAGNOSIS — R42 Dizziness and giddiness: Secondary | ICD-10-CM | POA: Diagnosis not present

## 2022-02-12 MED ORDER — MECLIZINE HCL 25 MG PO TABS
25.0000 mg | ORAL_TABLET | Freq: Three times a day (TID) | ORAL | 1 refills | Status: DC | PRN
  Filled 2022-02-12: qty 30, 10d supply, fill #0
  Filled 2022-05-31: qty 30, 10d supply, fill #1

## 2022-02-12 NOTE — Patient Instructions (Addendum)
I appreciate the opportunity to provide care to you today!    Follow up:  with Dr. Andria Meuse  Please pick up your medications at the pharmacy        Meclizine Take 1 hour prior to expected need The onset of action of meclizine for treatment of symptoms typically occurs in about 1 hour.  The effect of meclizine may last for up to 24 hours, especially for motion sickness; however, multiple daily doses may be needed to control symptoms depending on the condition being treated.      Please continue to a heart-healthy diet and increase your physical activities. Try to exercise for 57mins at least three times a week.      It was a pleasure to see you and I look forward to continuing to work together on your health and well-being. Please do not hesitate to call the office if you need care or have questions about your care.   Have a wonderful day and week. With Gratitude, Alvira Monday MSN, FNP-BC

## 2022-02-12 NOTE — Progress Notes (Signed)
   Acute Office Visit  Subjective:     Patient ID: Gabriel [name], male    DOB: 12/31/1975, 46 y.o.   MRN: [id number]  Chief Complaint  Patient presents with   Nausea   Dizziness    Started feeling nausea and dizziness on 02/04/22 .   Fatigue    Dizziness Pertinent negatives include no abdominal pain, chest pain, coughing, fever or vomiting.   The patient is in today with complaints of dizziness, nausea, and a spinning sensation.  Onset of symptoms began on 02/04/2022 with recurrence on 02/11/2022.  He reports adequate fluid intake with a near syncope.     He denies tenderness episode.  Felted better on 9/25/232 Lasted half the days, was just resting  Recurrencte yesterday, been going on since then.   Feeling he was going to [place] room is spinning    No fever, chills last night  2 loose stools    Review of Systems  Constitutional:  Negative for fever.  Eyes:  Negative for blurred vision and double vision.  Respiratory:  Negative for cough and shortness of breath.   Cardiovascular:  Negative for chest pain.  Gastrointestinal:  Negative for abdominal pain and vomiting.  Neurological:  Positive for dizziness.        Objective:    BP 117/76 (BP Location: Right Arm, Patient Position: Sitting)   Pulse 60   Ht 6' (1.829 m)   Wt 197 lb (89.4 kg)   SpO2 96%   BMI 26.72 kg/m    Physical Exam HENT:     Head: Normocephalic.  Cardiovascular:     Rate and Rhythm: Normal rate and regular rhythm.     Pulses: Normal pulses.     Heart sounds: Normal heart sounds.  Pulmonary:     Effort: Pulmonary effort is normal.     Breath sounds: Normal breath sounds.  Neurological:     Mental Status: He is alert.     No results found for any visits on 02/12/22.      Assessment & Plan:   Problem List Items Addressed This Visit       Other   Vertigo - Primary    We will treat with meclizine 25 mg 3 times daily as needed Informed patient to take meclizine  1 hour prior to expected need Informed the patient that the effects of meclizine may last up to 24 hours.      Relevant Medications   meclizine (ANTIVERT) 25 MG tablet    Meds ordered this encounter  Medications   meclizine (ANTIVERT) 25 MG tablet    Sig: Take 1 tablet (25 mg total) by mouth 3 (three) times daily as needed for dizziness.    Dispense:  30 tablet    Refill:  1    Return if symptoms worsen or fail to improve.  Alvira Monday, FNP

## 2022-02-12 NOTE — Assessment & Plan Note (Signed)
We will treat with meclizine 25 mg 3 times daily as needed Informed patient to take meclizine 1 hour prior to expected need Informed the patient that the effects of meclizine may last up to 24 hours.

## 2022-02-13 ENCOUNTER — Ambulatory Visit (HOSPITAL_COMMUNITY): Payer: 59 | Admitting: Psychiatry

## 2022-02-25 ENCOUNTER — Other Ambulatory Visit (HOSPITAL_COMMUNITY): Payer: Self-pay

## 2022-02-27 ENCOUNTER — Ambulatory Visit (INDEPENDENT_AMBULATORY_CARE_PROVIDER_SITE_OTHER): Payer: 59 | Admitting: Psychiatry

## 2022-02-27 DIAGNOSIS — F320 Major depressive disorder, single episode, mild: Secondary | ICD-10-CM | POA: Diagnosis not present

## 2022-02-27 NOTE — Progress Notes (Signed)
Virtual Visit via Video Note  I connected with Gabriel [name] on 02/27/22 at 10:00 AM EDT by a video enabled telemedicine application and verified that I am speaking with the correct person using two identifiers.  Location: Patient: Home Provider: [place] office    I discussed the limitations of evaluation and management by telemedicine and the availability of in person appointments. The patient expressed understanding and agreed to proceed.  I provided 52 minutes of non-face-to-face time during this encounter.   [name], LCSW  THERAPIST PROGRESS NOTE    Session Time:  Wednesday 02/27/2022 10:00 AM - 10:52 AM   Participation Level: Active  Treatment Goals addressed:   Improve ability to manage stress and anxiety AEB reducing episodes of anxiety (worry & nervousness) from 4 x per week to 1 x per week for 60 days per pt's self-report  Report a decrease in anxiety symptoms as evidenced by an overall reduction in anxiety score by a minimum of 25% on the Generalized Anxiety Disorder Scale for 2 months.     Progress in treatment:  progressing    Interventions: CBT and Supportive  Summary: Gabriel [name] is a 46 y.o. male who is referred for services by PCP Dr. Tula Nakayama due to patient experiencing symptoms of depression. He denies any psychiatric hospitalizations, He saw therapist Gwynneth Macleod S  wan in [place] for a few months. Patient reports symptoms began when he lost his job about a year ago. Per his report, he was fired after he complained about race discrimination. 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 He states now feeling as though he failed his family. He states not wanting to be around anyone and having difficulty engaging with his wife and children. He fears losing his family.  He reports ruminating thoughts about job  and the past, excessive worry depressed mood, tearfulness, anxiety, isolative behaviors, poor motivation, poor concentration, irritability, sleep difficulty, thoughts and feelings of hopelessness and worthlessness. He denies any suicidal ideations.  Patient last was seen via virtual visit about 4 weeks ago. He reports continued symptoms of anxiety.  Results of GAD-7 indicate a slight decrease in symptoms.  Per patient's report, he has the usual worries but reports additional stress and anxiety mainly related to experiencing vertigo in the past 3 to 4 weeks.  He reports being fearful and having thoughts of having no control when experiencing vertigo.  He reports increased irritability and lashing out at wife along with wanting to be alone during these episodes.  He saw his primary care physician who prescribed meclizine.      SI/HI .Nowithout intent/plan  Therapist Response:  reviewed symptoms, administere GAD-7, discussed results, assisted patient identify recent triggers of anxiety, facilitated expression of thoughts and feelings, validated feelings, assisted patient examine his thoughts/emotions/and behavior when experiencing vertigo, assisted patient identify connection between thoughts/emotions/and behavior, assisted patient challenge and replace irrational thought with more rational thought, developed plan with patient to use replacement thought if experiencing vertigo between sessions   Diagnosis: Axis I: MDD, single episode, severe    Axis II: No diagnosis  Collaboration of Care: Psychiatrist AEB patient working with psychiatrist Dr. Modesta Messing  Patient/Guardian was advised Release of Information must be obtained prior to any record release in order to collaborate their care with an outside provider. Patient/Guardian was advised if they  have not already done so to contact the registration department to sign all necessary forms in order for [place] to release information regarding their care.   Consent:  Patient/Guardian gives verbal consent for treatment and assignment of benefits for services provided during this visit. Patient/Guardian expressed understanding and agreed to proceed.   [name], LCSW 02/27/2022

## 2022-03-27 ENCOUNTER — Encounter (HOSPITAL_COMMUNITY): Payer: Self-pay

## 2022-03-27 ENCOUNTER — Ambulatory Visit (HOSPITAL_COMMUNITY): Payer: 59 | Admitting: Psychiatry

## 2022-04-01 NOTE — Progress Notes (Unsigned)
Virtual Visit via Video Note  I connected with Gabriel [name] on 04/03/22 at  8:20 AM EST by a video enabled telemedicine application and verified that I am speaking with the correct person using two identifiers.  Location: Patient: car Provider: office Persons participated in the visit- patient, provider    I discussed the limitations of evaluation and management by telemedicine and the availability of in person appointments. The patient expressed understanding and agreed to proceed.   I discussed the assessment and treatment plan with the patient. The patient was provided an opportunity to ask questions and all were answered. The patient agreed with the plan and demonstrated an understanding of the instructions.   The patient was advised to call back or seek an in-person evaluation if the symptoms worsen or if the condition fails to improve as anticipated.  I provided 15 minutes of non-face-to-face time during this encounter.   Gabriel Clay, MD    [facility] MD/PA/NP OP Progress Note  04/03/2022 8:43 AM Gabriel [name]  MRN:  [id number]  Chief Complaint:  Chief Complaint  Patient presents with   Depression   HPI:  This is a follow-up appointment for depression and anxiety.  He states that he has been feeling fatigued.  Although he has been trying to go outside, he has not been able to do as much due to constant pain in his right hands.  He may need to have surgery for this.  Although he takes tramadol at times, he is not using it as much as it makes him feel drowsy, although it is helpful for his pain.  He has insomnia, partly due to pain.  He denies change in appetite.  He denies SI.  He denies alcohol use or drug use. While discussing treatment options including [facility] [place], he reports preference to try gabapentin first and see how it goes before proceeding  [place].   Daily routine: takes a walk with his wife a few times per week Employment: unemployed. 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 He was raised by his mother. He reports good relationship with her, who taught him "love and respect." He has fair relationship with his father  Visit Diagnosis:    ICD-10-CM   1. MDD (major depressive disorder), single episode, mild ([place])  F32.0     2. Anxiety  F41.9     3. Insomnia, unspecified type  G47.00       Past Psychiatric History: Please see initial evaluation for full details. I have reviewed the history. No updates at this time.     Past Medical History:  Past Medical History:  Diagnosis Date   Anxiety    Carpal tunnel syndrome 2018   Depression    Depression    Phreesia 05/15/2020   Elevated LFTs    Heart murmur    Insomnia    Seasonal allergies    Vitamin D deficiency     Past Surgical History:  Procedure Laterality Date   NO PAST SURGERIES      Family Psychiatric History: Please see initial evaluation for full details. I have reviewed the history. No updates at this time.     Family History:  Family History  Problem Relation Age of Onset   Diabetes Father    Hypertension Father    Hypertension Mother  Hypertension Maternal Grandfather    Diabetes Maternal Grandfather    Cancer Paternal Grandmother        type unknown   Hypertension Paternal Grandmother    Cancer Paternal Grandfather        type unknown   Hypertension Paternal Grandfather     Social History:  Social History   Socioeconomic History   Marital status: Married    Spouse name: Not on file   Number of children: 2   Years of education: 12   Highest education level: High school graduate  Occupational History   Occupation: not employed  Tobacco Use   Smoking status: Never   Smokeless tobacco: Never  Vaping Use   Vaping Use: Never used  Substance and Sexual Activity   Alcohol use: No     Alcohol/week: 0.0 standard drinks of alcohol   Drug use: No   Sexual activity: Yes    Birth control/protection: None  Other Topics Concern   Not on file  Social History Narrative   He lives with wife and two children.   Unemployed at this present time   Right handed   One story home   Highest level of education:  12th grade   Social Determinants of Health   Financial Resource Strain: Medium Risk (09/08/2017)   Overall Financial Resource Strain (CARDIA)    Difficulty of Paying Living Expenses: Somewhat hard  Food Insecurity: No Food Insecurity (09/08/2017)   Hunger Vital Sign    Worried About Running Out of Food in the Last Year: Never true    Ran Out of Food in the Last Year: Never true  Transportation Needs: No Transportation Needs (09/08/2017)   PRAPARE - Hydrologist (Medical): No    Lack of Transportation (Non-Medical): No  Physical Activity: Not on file  Stress: Stress Concern Present (09/08/2017)   [place]    Feeling of Stress : Very much  Social Connections: Not on file    Allergies: No Known Allergies  Metabolic Disorder Labs: Lab Results  Component Value Date   HGBA1C 6.2 (H) 04/02/2022   MPG 137 05/27/2019   MPG 137 12/29/2018   No results found for: "PROLACTIN" Lab Results  Component Value Date   CHOL 170 04/02/2022   TRIG 74 04/02/2022   HDL 43 04/02/2022   CHOLHDL 4.0 04/02/2022   VLDL 10 12/04/2016   LDLCALC 113 (H) 04/02/2022   LDLCALC 132 (H) 09/17/2021   Lab Results  Component Value Date   TSH 1.790 09/17/2021   TSH 2.450 07/20/2020    Therapeutic Level Labs: No results found for: "LITHIUM" No results found for: "VALPROATE" No results found for: "CBMZ"  Current Medications: Current Outpatient Medications  Medication Sig Dispense Refill   gabapentin (NEURONTIN) 100 MG capsule Take 1 capsule (100 mg total) by mouth at bedtime for 3 days, THEN 2  capsules (200 mg total) at bedtime for 3 days. 9 capsule 0   [START ON 04/10/2022] gabapentin (NEURONTIN) 300 MG capsule Take 1 capsule (300 mg total) by mouth at bedtime. Start after completing 200 mg at night for 3 days 30 capsule 1   Brexpiprazole (REXULTI) 3 MG TABS Take 1 tablet (3 mg total) by mouth daily. 90 tablet 1   fluocinonide ointment (LIDEX) 0.05 % Apply twice daily to back for 2 weeks, then daily for 2 weeks, then every other day for 2 weeks 60 g 3   Fluticasone Propionate (XHANCE)  93 MCG/ACT EXHU Place 1 spray into the nose in the morning and at bedtime. 16 mL 3   hydrocortisone 2.5 % cream APPLY TO FACE 2 TIMES DAILY FOR 3 WEEKS, THEN DAILY FOR 3 WEEKS, THEN EVERY OTHER DAY FOR 3 WEEKS THEN STOP. REPEAT AS NEEDED. 30 g 3   ketoconazole (NIZORAL) 2 % shampoo Apply to face twice weekly and back daily x 1 wk, then twice weekly. 120 mL 11   loratadine (CLARITIN) 10 MG tablet Take 1 tablet (10 mg total) by mouth daily. 90 tablet 3   meclizine (ANTIVERT) 25 MG tablet Take 1 tablet (25 mg total) by mouth 3 (three) times daily as needed for dizziness. 30 tablet 1   mirtazapine (REMERON) 30 MG tablet Take 1 tablet (30 mg total) by mouth at bedtime. 90 tablet 0   sodium chloride (OCEAN) 0.65 % nasal spray PLACE 1 SPRAY INTO THE NOSE AS NEEDED FOR CONGESTION. 30 mL 12   topiramate (TOPAMAX) 100 MG tablet Take 1 tablet (100 mg total) by mouth 2 (two) times daily. 180 tablet 1   traMADol (ULTRAM) 50 MG tablet Take 1-2 tablets (50-100 mg total) by mouth every 6 (six) hours as needed. 180 tablet 5   tretinoin (RETIN-A) 0.025 % cream Apply 1 application topically at bedtime.     venlafaxine XR (EFFEXOR-XR) 150 MG 24 hr capsule Take 1 capsule (150 mg total) by mouth daily. 90 capsule 1   venlafaxine XR (EFFEXOR-XR) 75 MG 24 hr capsule Take 1 capsule (75 mg total) by mouth daily. Total of 225 mg daily. Take along with 150 mg cap 90 capsule 1   No current facility-administered medications for this  visit.     Musculoskeletal: Strength & Muscle Tone:  N/A Gait & Station:  N/A Patient leans: N/A  Psychiatric Specialty Exam: Review of Systems  Psychiatric/Behavioral:  Positive for sleep disturbance. Negative for agitation, behavioral problems, confusion, decreased concentration, dysphoric mood, hallucinations, self-injury and suicidal ideas. The patient is nervous/anxious. The patient is not hyperactive.   All other systems reviewed and are negative.   There were no vitals taken for this visit.There is no height or weight on file to calculate BMI.  General Appearance: Fairly Groomed  Eye Contact:  Good  Speech:  Clear and Coherent  Volume:  Normal  Mood:   tired  Affect:  Appropriate, Congruent, and calm, fatigue  Thought Process:  Coherent  Orientation:  Full (Time, Place, and Person)  Thought Content: Logical   Suicidal Thoughts:  No  Homicidal Thoughts:  No  Memory:  Immediate;   Good  Judgement:  Good  Insight:  Good  Psychomotor Activity:  Normal  Concentration:  Concentration: Good and Attention Span: Good  Recall:  Good  Fund of Knowledge: Good  Language: Good  Akathisia:  No  Handed:  Right  AIMS (if indicated): not done  Assets:  Communication Skills Desire for Improvement  ADL's:  Intact  Cognition: WNL  Sleep:  Poor   Screenings: GAD-7    Flowsheet Row Counselor from 02/27/2022 in [place] Counselor from 01/30/2022 in [place] Counselor from 08/30/2021 in [place] Counselor from 08/17/2021 in [place] from 07/03/2021 in [place] [facility]  Total GAD-7 Score 11 12 10 10 14       PHQ2-9    [place] Office Visit from 02/12/2022 in [place] from 01/30/2022 in [place]  [facility] [facility] from [facility] [facility] Visit from 11/30/2021 in [facility] Office Visit from 08/31/2021 in [facility]  PHQ-2 Total Score 2 2 2 2 2   PHQ-9 Total Score 10 9 10 11 9       Flowsheet Row Counselor from 12/20/2021 in [facility] Counselor from 11/03/2020 in [facility] Video Visit from 10/26/2020 in [facility]  C-SSRS RISK CATEGORY No Risk No Risk No Risk        Assessment and Plan:  Gabriel [name] is a 46 y.o. year old male with a history of depression, bilateral carpel tunnel syndrome, who presents for follow up appointment for below.     1. MDD (major depressive disorder), single episode, mild ([facility]) 2. Anxiety He continues to report depressive symptoms with prominent fatigue and anxiety in the context of worsening in neuropathic pain in his hand. Psychosocial stressors includes unemployment, loss of his maternal aunt, migraine, and pain secondary to carpal tunnel syndrome.  Will add gabapentin to target anxiety, neuropathic pain and insomnia.  Discussed potential risk of drowsiness.  Will continue venlafaxine, mirtazapine and rexulti to target depression.  Noted that although it was discussed to consider TMS, he is not interested in this option at this time.  He will continue to see Ms. Bynum for therapy.   3. Insomnia, unspecified type Sleep evaluation was not consistent with sleep apnea.  Will try gabapentin as described above.     Plan  Continue venlafaxine 225 mg (150 mg + 75 mg) daily Continue mirtazapine 30 mg at night  Start gabapentin 100 mg at night for 3 days, then 200 mg at night for 3 days, then 300 mg at night Continue Rexulti 3 mg at night - monitor drowsiness  Next appointment: 1/3 at 1:40 , video [email or web address].   - sleep study, not consistent  with sleep apnea in 2023 - on tramadol , [place] 600 Mcg Film  - on topiramate for migraine   Past trials of medication: sertraline, fluoxetine, mirtazapine, nortriptyline for carpel tunnel syndrome, bupropion (drowsiness), quetiapine (drowsiness), Abilify ("off balance"), Trazodone, Ambien (limited benefit), lorazepam (worsening in anxiety, drowsiness) , temazepam, Lunesta    Collaboration of Care: Collaboration of Care: Other reviewed notes in Epic  Patient/Guardian was advised Release of Information must be obtained prior to any record release in order to collaborate their care with an outside provider. Patient/Guardian was advised if they have not already done so to contact the registration department to sign all necessary forms in order for 49 to release information regarding their care.   Consent: Patient/Guardian gives verbal consent for treatment and assignment of benefits for services provided during this visit. Patient/Guardian expressed understanding and agreed to proceed.    2024, MD 04/03/2022, 8:43 AM

## 2022-04-02 DIAGNOSIS — Z125 Encounter for screening for malignant neoplasm of prostate: Secondary | ICD-10-CM | POA: Diagnosis not present

## 2022-04-02 DIAGNOSIS — E785 Hyperlipidemia, unspecified: Secondary | ICD-10-CM | POA: Diagnosis not present

## 2022-04-02 DIAGNOSIS — R7302 Impaired glucose tolerance (oral): Secondary | ICD-10-CM | POA: Diagnosis not present

## 2022-04-03 ENCOUNTER — Encounter: Payer: Self-pay | Admitting: Psychiatry

## 2022-04-03 ENCOUNTER — Encounter: Payer: Self-pay | Admitting: Family Medicine

## 2022-04-03 ENCOUNTER — Other Ambulatory Visit (HOSPITAL_COMMUNITY): Payer: Self-pay

## 2022-04-03 ENCOUNTER — Telehealth (INDEPENDENT_AMBULATORY_CARE_PROVIDER_SITE_OTHER): Payer: 59 | Admitting: Psychiatry

## 2022-04-03 ENCOUNTER — Ambulatory Visit (INDEPENDENT_AMBULATORY_CARE_PROVIDER_SITE_OTHER): Payer: 59 | Admitting: Family Medicine

## 2022-04-03 VITALS — BP 131/78 | HR 70 | Ht 72.0 in | Wt 202.0 lb

## 2022-04-03 DIAGNOSIS — E559 Vitamin D deficiency, unspecified: Secondary | ICD-10-CM | POA: Diagnosis not present

## 2022-04-03 DIAGNOSIS — E785 Hyperlipidemia, unspecified: Secondary | ICD-10-CM | POA: Diagnosis not present

## 2022-04-03 DIAGNOSIS — Z0000 Encounter for general adult medical examination without abnormal findings: Secondary | ICD-10-CM

## 2022-04-03 DIAGNOSIS — G4700 Insomnia, unspecified: Secondary | ICD-10-CM

## 2022-04-03 DIAGNOSIS — Z23 Encounter for immunization: Secondary | ICD-10-CM | POA: Diagnosis not present

## 2022-04-03 DIAGNOSIS — F419 Anxiety disorder, unspecified: Secondary | ICD-10-CM | POA: Diagnosis not present

## 2022-04-03 DIAGNOSIS — R7303 Prediabetes: Secondary | ICD-10-CM | POA: Diagnosis not present

## 2022-04-03 DIAGNOSIS — F320 Major depressive disorder, single episode, mild: Secondary | ICD-10-CM | POA: Diagnosis not present

## 2022-04-03 LAB — CBC
Hematocrit: 45.3 % (ref 37.5–51.0)
Hemoglobin: 15.1 g/dL (ref 13.0–17.7)
MCH: 29.2 pg (ref 26.6–33.0)
MCHC: 33.3 g/dL (ref 31.5–35.7)
MCV: 88 fL (ref 79–97)
Platelets: 350 10*3/uL (ref 150–450)
RBC: 5.18 x10E6/uL (ref 4.14–5.80)
RDW: 11.9 % (ref 11.6–15.4)
WBC: 4.4 10*3/uL (ref 3.4–10.8)

## 2022-04-03 LAB — LIPID PANEL
Chol/HDL Ratio: 4 ratio (ref 0.0–5.0)
Cholesterol, Total: 170 mg/dL (ref 100–199)
HDL: 43 mg/dL (ref >39)
LDL Chol Calc (NIH): 113 mg/dL — ABNORMAL HIGH (ref 0–99)
Triglycerides: 74 mg/dL (ref 0–149)
VLDL Cholesterol Cal: 14 mg/dL (ref 5–40)

## 2022-04-03 LAB — HEMOGLOBIN A1C
Est. average glucose Bld gHb Est-mCnc: 131 mg/dL
Hgb A1c MFr Bld: 6.2 % — ABNORMAL HIGH (ref 4.8–5.6)

## 2022-04-03 LAB — PSA: Prostate Specific Ag, Serum: 0.6 ng/mL (ref 0.0–4.0)

## 2022-04-03 MED ORDER — GABAPENTIN 300 MG PO CAPS
300.0000 mg | ORAL_CAPSULE | Freq: Every day | ORAL | 1 refills | Status: DC
  Filled 2022-04-03: qty 30, 30d supply, fill #0

## 2022-04-03 MED ORDER — GABAPENTIN 100 MG PO CAPS
ORAL_CAPSULE | ORAL | 0 refills | Status: DC
Start: 2022-04-03 — End: 2022-05-10
  Filled 2022-04-03: qty 9, 6d supply, fill #0

## 2022-04-03 NOTE — Progress Notes (Signed)
   Gabriel [name]     MRN: [id number]      DOB: October 13, 1975   HPI: Patient is in for annual physical exam. No other health concerns are expressed or addressed at the visit. Recent labs, if available are reviewed. Immunization is reviewed , and  updated if needed.    PE; BP 131/78 (BP Location: Right Arm, Patient Position: Sitting, Cuff Size: Large)   Pulse 70   Ht 6' (1.829 m)   Wt 202 lb 0.6 oz (91.6 kg)   SpO2 96%   BMI 27.40 kg/m   Pleasant male, alert and oriented x 3, in no cardio-pulmonary distress. Afebrile. HEENT No facial trauma or asymetry. Sinuses non tender. EOMI External ears normal,  Neck: supple, no adenopathy,JVD or thyromegaly.No bruits.  Chest: Clear to ascultation bilaterally.No crackles or wheezes. Non tender to palpation  Cardiovascular system; Heart sounds normal,  S1 and  S2 ,no S3.  No murmur, or thrill. Apical beat not displaced Peripheral pulses normal.  Abdomen: Soft, non tender, ound.    Musculoskeletal exam: Full ROM of spine, hips , shoulders and knees. No deformity ,swelling or crepitus noted. No muscle wasting or atrophy.   Neurologic: Cranial nerves 2 to 12 intact. Power, tone ,sensation and reflexes normal throughout. No disturbance in gait. No tremor.  Skin: Intact, no ulceration, erythema , scaling or rash noted. Pigmentation normal throughout  Psych; Normal mood and flat affect.   Assessment & Plan:  Encounter for annual physical exam Annual exam as documented. Counseling done  re healthy lifestyle involving commitment to 150 minutes exercise per week, heart healthy diet, and attaining healthy weight.The importance of adequate sleep also discussed. Regular seat belt use and home safety, is also discussed. Changes in health habits are decided on by the patient with goals and time frames  set for achieving them. Immunization and cancer screening needs are specifically addressed at this visit.

## 2022-04-03 NOTE — Patient Instructions (Signed)
Continue venlafaxine 225 mg (150 mg + 75 mg) daily Continue mirtazapine 30 mg at night  Start gabapentin 100 mg at night for 3 days, then 200 mg at night for 3 days, then 300 mg at night Continue Rexulti 3 mg at night  Next appointment: 1/3 at 1:40

## 2022-04-03 NOTE — Patient Instructions (Addendum)
F/U in 5.5 months, call if you need me sooner. TdAP at visit  Flu vaccine today  Nurse please  add cmp and EGFr, to recent labs, you will get a message on these  Work on daily walking  Blood sugar and cholesterol have improved but are still a bit high  Increase vegetables, fruit, beans, and reduce cheese  Check out Christmas lights!  [place], [place]  Fasting lipid, hBA1c, TSH, Vit d, cmp and EGFr 5 dayas before follow up  Please get your colonoscopy  Thanks for choosing [facility], we consider it a privelige to serve you.

## 2022-04-05 ENCOUNTER — Encounter: Payer: Self-pay | Admitting: Family Medicine

## 2022-04-05 NOTE — Assessment & Plan Note (Signed)

## 2022-04-09 LAB — CMP14+EGFR
ALT: 41 IU/L (ref 0–44)
AST: 27 IU/L (ref 0–40)
Albumin/Globulin Ratio: 2 (ref 1.2–2.2)
Albumin: 4.6 g/dL (ref 4.1–5.1)
Alkaline Phosphatase: 89 IU/L (ref 44–121)
BUN/Creatinine Ratio: 10 (ref 9–20)
BUN: 11 mg/dL (ref 6–24)
Bilirubin Total: 0.4 mg/dL (ref 0.0–1.2)
CO2: 25 mmol/L (ref 20–29)
Calcium: 9.5 mg/dL (ref 8.7–10.2)
Chloride: 104 mmol/L (ref 96–106)
Creatinine, Ser: 1.15 mg/dL (ref 0.76–1.27)
Globulin, Total: 2.3 g/dL (ref 1.5–4.5)
Glucose: 108 mg/dL — ABNORMAL HIGH (ref 70–99)
Potassium: 4.4 mmol/L (ref 3.5–5.2)
Sodium: 143 mmol/L (ref 134–144)
Total Protein: 6.9 g/dL (ref 6.0–8.5)
eGFR: 79 mL/min/{1.73_m2} (ref >59)

## 2022-04-09 LAB — SPECIMEN STATUS REPORT

## 2022-04-10 ENCOUNTER — Ambulatory Visit (INDEPENDENT_AMBULATORY_CARE_PROVIDER_SITE_OTHER): Payer: 59 | Admitting: Psychiatry

## 2022-04-10 DIAGNOSIS — F320 Major depressive disorder, single episode, mild: Secondary | ICD-10-CM

## 2022-04-10 NOTE — Progress Notes (Signed)
Virtual Visit via Video Note  I connected with Gabriel [name] on 04/10/22 at 10:16 AM EST  by a video enabled telemedicine application and verified that I am speaking with the correct person using two identifiers.  Location: Patient: Home Provider: [facility] [place] office    I discussed the limitations of evaluation and management by telemedicine and the availability of in person appointments. The patient expressed understanding and agreed to proceed.  The patient was advised to call back or seek an in-person evaluation if the symptoms worsen or if the condition fails to improve as anticipated.  I provided 39 minutes of non-face-to-face time during this encounter.   [name], LCSW  THERAPIST PROGRESS NOTE    Session Time:  Wednesday 04/10/2022 10:16 AM - 10:55 AM   Participation Level: Active  Treatment Goals addressed:   Improve ability to manage stress and anxiety AEB reducing episodes of anxiety (worry & nervousness) from 4 x per week to 1 x per week for 60 days per pt's self-report  Report a decrease in anxiety symptoms as evidenced by an overall reduction in anxiety score by a minimum of 25% on the Generalized Anxiety Disorder Scale for 2 months.     Progress in treatment:  progressing    Interventions: CBT and Supportive  Summary: Gabriel [name] is a 46 y.o. male who is referred for services by PCP Dr. Syliva Overman due to patient experiencing symptoms of depression. He denies any psychiatric hospitalizations, He saw therapist Meredith Staggers S  wan in [place] for a few months. Patient reports symptoms began when he lost his job about a year ago. Per his report, he was fired after he complained about race discrimination. 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 He states now feeling as though he failed his family. He states not wanting  to be around anyone and having difficulty engaging with his wife and children. He fears losing his family.  He reports ruminating thoughts about job and the past, excessive worry depressed mood, tearfulness, anxiety, isolative behaviors, poor motivation, poor concentration, irritability, sleep difficulty, thoughts and feelings of hopelessness and worthlessness. He denies any suicidal ideations.  Patient last was seen via virtual visit about 6 weeks ago. He reports decreased intensity/frequency of symptoms of anxiety as reflected in the GAD-7.  Patient reports being more active and moving around more rather than staying in bed or sitting most of the day.  He reports decreased stress and anxiety regarding experiencing vertigo.  Per patient's report he used replacement statements developed in last session and says this was very helpful.  Patient reports enjoying celebrating Thanksgiving with his wife and children as they shared comments about being thankful for each other.  Per patient's report, this gave him reassurance about his relationship with his family especially his wife.  He is pleased with this but still struggles with thoughts of being a burden.      SI/HI .Nowithout intent/plan  Therapist Response:  reviewed symptoms, administered GAD-7, discussed results, praised and reinforced patient's increased movement/activity, discussed effects on thoughts/mood/behavior, praised and reinforced patient's use of replacement statements when experiencing vertigo, discussed effects on thoughts/mood/behavior, facilitated patient expressing thoughts and feelings about celebrating Thanksgiving with his family, discussed effects on his mood/thoughts/behavior, assisted patient identify replacement statements and things to do when having negative thoughts about self/being a  burden to family, developed plan with patient to use replacement statement/stay engaged with family/ask wife if there is something he can do to assist  her   Diagnosis: Axis I: MDD, single episode, severe    Axis II: No diagnosis  Collaboration of Care: Psychiatrist AEB patient working with psychiatrist Dr. Vanetta Shawl  Patient/Guardian was advised Release of Information must be obtained prior to any record release in order to collaborate their care with an outside provider. Patient/Guardian was advised if they have not already done so to contact the registration department to sign all necessary forms in order for [place] to release information regarding their care.   Consent: Patient/Guardian gives verbal consent for treatment and assignment of benefits for services provided during this visit. Patient/Guardian expressed understanding and agreed to proceed.   [name], LCSW 04/10/2022

## 2022-04-24 ENCOUNTER — Ambulatory Visit (INDEPENDENT_AMBULATORY_CARE_PROVIDER_SITE_OTHER): Payer: 59 | Admitting: Psychiatry

## 2022-04-24 DIAGNOSIS — F320 Major depressive disorder, single episode, mild: Secondary | ICD-10-CM

## 2022-04-24 DIAGNOSIS — F419 Anxiety disorder, unspecified: Secondary | ICD-10-CM | POA: Diagnosis not present

## 2022-04-24 NOTE — Progress Notes (Signed)
Virtual Visit via Video Note  I connected with Gabriel [name] on 04/24/22 at 10:12 AM EST  by a video enabled telemedicine application and verified that I am speaking with the correct person using two identifiers.  Location: Patient: Home Provider: [facility] [place] office    I discussed the limitations of evaluation and management by telemedicine and the availability of in person appointments. The patient expressed understanding and agreed to proceed.  I provided 42 minutes of non-face-to-face time during this encounter.   [name], LCSW THERAPIST PROGRESS NOTE    Session Time:  Wednesday 04/24/2022 10:12 AM - 10:56 AM   Participation Level: Active  Treatment Goals addressed:   Improve ability to manage stress and anxiety AEB reducing episodes of anxiety (worry & nervousness) from 4 x per week to 1 x per week for 60 days per pt's self-report  Report a decrease in anxiety symptoms as evidenced by an overall reduction in anxiety score by a minimum of 25% on the Generalized Anxiety Disorder Scale for 2 months.     Progress in treatment:  progressing    Interventions: CBT and Supportive  Summary: Gabriel [name] is a 46 y.o. male who is referred for services by PCP Dr. Syliva Overman due to patient experiencing symptoms of depression. He denies any psychiatric hospitalizations, He saw therapist Meredith Staggers S  [place] in [place] for a few months. Patient reports symptoms began when he lost his job about a year ago. Per his report, he was fired after he complained about race discrimination. 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 He states now feeling as though he failed his family. He states not wanting to be around anyone and having difficulty engaging with his wife and children. He fears losing his family.  He reports ruminating thoughts about job  and the past, excessive worry depressed mood, tearfulness, anxiety, isolative behaviors, poor motivation, poor concentration, irritability, sleep difficulty, thoughts and feelings of hopelessness and worthlessness. He denies any suicidal ideations.  Patient last was seen via virtual visit about 2 weeks ago. He reports continued low level of intensity/frequency of symptoms of anxiety as reflected in the GAD-7.  He reports having episodes of anxiety about 1-2 times per week since last session.  Patient remains involved in activity as well as engaged with his family.  However, he reports increased difficulty doing this due to increased pain related to carpal tunnel and neuropathy.  This has caused increased sleep difficulty along with irritability.  Patient reports he is taking medication as prescribed.  He also is trying to use relaxation techniques regularly.  Patient reports he has not experienced any thoughts of being a burden since last session.      SI/HI .Nowithout intent/plan  Therapist Response:  reviewed symptoms, administered GAD-7, discussed results, praised and reinforced patient's continued efforts to remain involved with family and participate in activity, reviewed connection between thoughts/mood/behavior, assisted patient identify triggers of negative thoughts about self/being a burden to family, reviewed use of replacement statements/identify ways to be supportive and stay engaged with wife when experiencing negative thoughts about being unable to perform tasks his wife may have to perform, developed plan with patient to implement steps discussed in session should he experience negative thoughts about self  Diagnosis: Axis I: MDD, single episode, severe    Axis II: No  diagnosis  Collaboration of Care: Psychiatrist AEB patient working with psychiatrist Dr. Vanetta Shawl  Patient/Guardian was advised Release of Information must be obtained prior to any record release in order to collaborate their  care with an outside provider. Patient/Guardian was advised if they have not already done so to contact the registration department to sign all necessary forms in order for [place] to release information regarding their care.   Consent: Patient/Guardian gives verbal consent for treatment and assignment of benefits for services provided during this visit. Patient/Guardian expressed understanding and agreed to proceed.   [name], LCSW 04/24/2022

## 2022-05-08 ENCOUNTER — Ambulatory Visit (INDEPENDENT_AMBULATORY_CARE_PROVIDER_SITE_OTHER): Payer: 59 | Admitting: Psychiatry

## 2022-05-08 DIAGNOSIS — F419 Anxiety disorder, unspecified: Secondary | ICD-10-CM

## 2022-05-08 DIAGNOSIS — F320 Major depressive disorder, single episode, mild: Secondary | ICD-10-CM | POA: Diagnosis not present

## 2022-05-08 NOTE — Progress Notes (Signed)
Virtual Visit via Video Note  I connected with Gabriel [name] on 05/08/22 at 10:10 AM EST  by a video enabled telemedicine application and verified that I am speaking with the correct person using two identifiers.  Location: Patient: Home Provider: [facility] [place] office    I discussed the limitations of evaluation and management by telemedicine and the availability of in person appointments. The patient expressed understanding and agreed to proceed.   I provided 36 minutes of non-face-to-face time during this encounter.   [name], LCSW  THERAPIST PROGRESS NOTE    Session Time:  Wednesday 05/08/2022 10:10 AM - 10:46 AM   Participation Level: Active  Treatment Goals addressed:   Improve ability to manage stress and anxiety AEB reducing episodes of anxiety (worry & nervousness) from 4 x per week to 1 x per week for 60 days per pt's self-report  Report a decrease in anxiety symptoms as evidenced by an overall reduction in anxiety score by a minimum of 25% on the Generalized Anxiety Disorder Scale for 2 months.     Progress in treatment:  progressing    Interventions: CBT and Supportive  Summary: Gabriel [name] is a 46 y.o. male who is referred for services by PCP Dr. Syliva Overman due to patient experiencing symptoms of depression. He denies any psychiatric hospitalizations, He saw therapist Meredith Staggers S  wan in [place] for a few months. Patient reports symptoms began when he lost his job about a year ago. Per his report, he was fired after he complained about race discrimination. 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 He states now feeling as though he failed his family. He states not wanting to be around anyone and having difficulty engaging with his wife and children. He fears losing his family.  He reports ruminating thoughts about  job and the past, excessive worry depressed mood, tearfulness, anxiety, isolative behaviors, poor motivation, poor concentration, irritability, sleep difficulty, thoughts and feelings of hopelessness and worthlessness. He denies any suicidal ideations.  Patient last was seen via virtual visit about 2 weeks ago. He reports continued low level of intensity/frequency of symptoms of anxiety as reflected in the GAD-7.  He reports having episodes of anxiety about 3-4 x since last session.  This was triggered by concerns about Christmas presents for his family.  Patient reports having thoughts of being unable to give them what they want.  He reports isolating for a little while but then resuming engagement with his family.  He reports enjoying celebrating Christmas with his family.  He reports he is going outside walking when the weather permits.  He also reports he has been sitting on the porch more frequently and enjoying nature.  Patient reports this is very relaxing.   SI/HI .Nowithout intent/plan  Therapist Response:  reviewed symptoms, administered GAD-7, discussed results, praised and reinforced patient's recognition of his thoughts and his efforts to resume involvement with his family, discussed the effects on his mood and thoughts, assisted patient identify the way he related to his thoughts and the effects on his mood and behavior, reviewed connection between thoughts/mood/behavior, began to assist patient identify perfectionistic thought patterns and effects, developed plan with patient to continue pursuing value congruent activity   Diagnosis: Axis I: MDD, single episode, severe    Axis II: No diagnosis  Collaboration of Care:  Psychiatrist AEB patient working with psychiatrist Dr. Vanetta Shawl  Patient/Guardian was advised Release of Information must be obtained prior to any record release in order to collaborate their care with an outside provider. Patient/Guardian was advised if they have not already  done so to contact the registration department to sign all necessary forms in order for [place] to release information regarding their care.   Consent: Patient/Guardian gives verbal consent for treatment and assignment of benefits for services provided during this visit. Patient/Guardian expressed understanding and agreed to proceed.   [name], LCSW 05/08/2022

## 2022-05-10 ENCOUNTER — Encounter: Payer: Self-pay | Admitting: Allergy & Immunology

## 2022-05-10 ENCOUNTER — Other Ambulatory Visit: Payer: Self-pay

## 2022-05-10 ENCOUNTER — Other Ambulatory Visit (HOSPITAL_COMMUNITY): Payer: Self-pay

## 2022-05-10 ENCOUNTER — Ambulatory Visit: Payer: 59 | Admitting: Allergy & Immunology

## 2022-05-10 VITALS — BP 124/80 | HR 78 | Temp 98.2°F | Resp 16 | Wt 204.2 lb

## 2022-05-10 DIAGNOSIS — K9049 Malabsorption due to intolerance, not elsewhere classified: Secondary | ICD-10-CM | POA: Diagnosis not present

## 2022-05-10 DIAGNOSIS — J3089 Other allergic rhinitis: Secondary | ICD-10-CM

## 2022-05-10 DIAGNOSIS — J302 Other seasonal allergic rhinitis: Secondary | ICD-10-CM

## 2022-05-10 DIAGNOSIS — L2089 Other atopic dermatitis: Secondary | ICD-10-CM

## 2022-05-10 MED ORDER — LEVOCETIRIZINE DIHYDROCHLORIDE 5 MG PO TABS
5.0000 mg | ORAL_TABLET | Freq: Every evening | ORAL | 2 refills | Status: DC
  Filled 2022-05-10: qty 90, 90d supply, fill #0

## 2022-05-10 MED ORDER — AZELASTINE HCL 0.1 % NA SOLN
1.0000 | Freq: Two times a day (BID) | NASAL | 5 refills | Status: DC
  Filled 2022-05-10: qty 30, 50d supply, fill #0

## 2022-05-10 MED ORDER — FLUTICASONE PROPIONATE 50 MCG/ACT NA SUSP
1.0000 | Freq: Every day | NASAL | 5 refills | Status: DC
  Filled 2022-05-10: qty 16, 30d supply, fill #0
  Filled 2022-11-26: qty 16, 30d supply, fill #1

## 2022-05-10 NOTE — Patient Instructions (Addendum)
1. Seasonal and perennial allergic rhinitis - Testing today showed: grasses, ragweed, weeds, trees, indoor molds, outdoor molds, dust mites, cat, and cockroach. - Copy of test results provided.  - Avoidance measures provided. - Stop taking: Claritin - Continue with: Flonase (fluticasone) one spray per nostril TWICE daily (AIM FOR EAR ON EACH SIDE) - Start taking: Xyzal (levocetirizine) 5mg  tablet once daily and Astelin (azelastine) 2 sprays per nostril TWICE daily as needed - You can use an extra dose of the antihistamine, if needed, for breakthrough symptoms.  - Consider nasal saline rinses 1-2 times daily to remove allergens from the nasal cavities as well as help with mucous clearance (this is especially helpful to do before the nasal sprays are given) - Consider allergy shots as a means of long-term control. - Allergy shots "re-train" and "reset" the immune system to ignore environmental allergens and decrease the resulting immune response to those allergens (sneezing, itchy watery eyes, runny nose, nasal congestion, etc).    - Allergy shots improve symptoms in 75-85% of patients.  - We can discuss more at the next appointment if the medications are not working for you.  2. Flexural atopic dermatitis - Continue with all of the creams per Dr. Kemper Durie. - Allergy shots will help with the eczema as well, so consider this. - We can send in refills if you would like.   3. Food intolerance - Testing was slightly reactive to soy and hazelnut. - I am not sure how relevant they are since they are so small and you have never reacted to them in the past. - You can certainly avoid them if you would like to, but I do not think that this is necessary.   4. Return in about 3 months (around 08/09/2022).    Please inform us of any Emergency Department visits, hospitalizations, or changes in symptoms. Call us before going to the ED for breathing or allergy symptoms since we might be able to fit you in  for a sick visit. Feel free to contact us anytime with any questions, problems, or concerns.  It was a pleasure to see you again today!  Websites that have reliable patient information: 1. American Academy of Asthma, Allergy, and Immunology: www.aaaai.org 2. Food Allergy Research and Education (FARE): foodallergy.org 3. Mothers of Asthmatics: http://www.asthmacommunitynetwork.org 4. American College of Allergy, Asthma, and Immunology: www.acaai.org   COVID-19 Vaccine Information can be found at: [email or web address] For questions related to vaccine distribution or appointments, please email [email or web address] or call [phone].   We realize that you might be concerned about having an allergic reaction to the COVID19 vaccines. To help with that concern, WE ARE OFFERING THE COVID19 VACCINES IN OUR OFFICE! Ask the front desk for dates!     "Like" [place] on Facebook and Instagram for our latest updates!      A healthy democracy works best when [employer] participate! Make sure you are registered to vote! If you have moved or changed any of your contact information, you will need to get this updated before voting!  In some cases, you MAY be able to register to vote online: [email or web address]     Airborne Adult Perc - 05/10/22 1520     Time Antigen Placed 1500    Allergen Manufacturer Lavella Hammock    Location Back    Number of Test 59    1. Control-Buffer 50% Glycerol Negative    2. Control-Histamine 1 mg/ml 2+    3. Albumin saline Negative    4. [place] Negative  5. [place] 3+    6. [place] Negative    7. [place] Blue Negative    8. Meadow Fescue 2+    9. Perennial Rye Negative    10. Sweet Vernal Negative    11. Timothy 3+    12. Cocklebur Negative    13. Burweed Marshelder Negative    14. Ragweed, short 4+    15. Ragweed, Giant Negative    16. Plantain,  English Negative    17. Lamb's  Quarters Negative    18. Sheep Sorrell Negative    19. Rough Pigweed Negative    20. Marsh Elder, Rough 2+    21. Mugwort, Common Negative    22. Ash mix Negative    23. Birch mix 4+    24. Beech American 4+    25. Box, Elder 4+    26. Cedar, red 3+    27. Cottonwood, [place] Negative    28. Elm mix Negative    29. Hickory Negative    30. Maple mix 3+    31. Oak, [place] mix 3+    32. Pecan Pollen 3+    33. Pine mix Negative    34. Sycamore Eastern Negative    35. [place], Black Pollen Negative    36. Alternaria alternata Negative    37. Cladosporium Herbarum Negative    38. Aspergillus mix Negative    39. Penicillium mix Negative    40. Bipolaris sorokiniana (Helminthosporium) Negative    41. Drechslera spicifera (Curvularia) Negative    42. Mucor plumbeus Negative    43. Fusarium moniliforme Negative    44. Aureobasidium pullulans (pullulara) Negative    45. Rhizopus oryzae Negative    46. Botrytis cinera Negative    47. Epicoccum nigrum Negative    48. Phoma betae Negative    49. Candida Albicans Negative    50. Trichophyton mentagrophytes Negative    51. Mite, D Farinae  5,000 AU/ml Negative    52. Mite, D Pteronyssinus  5,000 AU/ml Negative    53. Cat Hair 10,000 BAU/ml Negative    54.  Dog Epithelia Negative    55. Mixed Feathers Negative    56. Horse Epithelia Negative    57. Cockroach, German Negative    58. Mouse Negative    59. Tobacco Leaf Negative             Intradermal - 05/10/22 1523     Time Antigen Placed 1530    Allergen Manufacturer Lavella Hammock    Location Arm    Number of Test 8    Control Negative    Mold 1 4+    Mold 2 3+    Mold 3 2+    Mold 4 2+    Cat 2+    Dog Negative    Cockroach 2+    Mite mix 3+             Food Adult Perc - 05/10/22 1500     Time Antigen Placed 1500    Allergen Manufacturer Greer    Location Back    Number of allergen test 34     Control-buffer 50% Glycerol Negative    Control-Histamine 1 mg/ml 2+     1. Peanut Negative    2. Soybean --   5x7   3. Wheat Negative    4. Sesame Negative    5. Milk, cow Negative    6. Egg White, Chicken Negative    7. Casein Negative    8. Shellfish  Mix Negative    9. Fish Mix Negative    10. Cashew Negative    11. Pecan Food Negative    12. [place] Negative    13. Almond Negative    14. Hazelnut --   5x8   15. [place] nut Negative    16. Coconut Negative    17. Pistachio Negative    18. Catfish Negative    19. Bass Negative    20. Trout Negative    21. Tuna Negative    22. Salmon Negative    23. Flounder Negative    24. Codfish Negative    25. Shrimp Negative    26. Crab Negative    27. Lobster Negative    28. Oyster Negative    29. Scallops Negative    37. Pork Negative    38. [place] Meat Negative    39. Chicken Meat Negative    40. Beef Negative    41. Lamb Negative    64. Chocolate/Cacao bean Negative             Reducing Pollen Exposure  The American Academy of Allergy, Asthma and Immunology suggests the following steps to reduce your exposure to pollen during allergy seasons.    Do not hang sheets or clothing out to dry; pollen may collect on these items. Do not mow lawns or spend time around freshly cut grass; mowing stirs up pollen. Keep windows closed at night.  Keep car windows closed while driving. Minimize morning activities outdoors, a time when pollen counts are usually at their highest. Stay indoors as much as possible when pollen counts or humidity is high and on windy days when pollen tends to remain in the air longer. Use air conditioning when possible.  Many air conditioners have filters that trap the pollen spores. Use a HEPA room air filter to remove pollen form the indoor air you breathe.  Control of Mold Allergen   Mold and fungi can grow on a variety of surfaces provided certain temperature and moisture conditions exist.  Outdoor molds grow on plants, decaying vegetation and soil.  The major outdoor  mold, Alternaria and Cladosporium, are found in very high numbers during hot and dry conditions.  Generally, a late Summer - Fall peak is seen for common outdoor fungal spores.  Rain will temporarily lower outdoor mold spore count, but counts rise rapidly when the rainy period ends.  The most important indoor molds are Aspergillus and Penicillium.  Dark, humid and poorly ventilated basements are ideal sites for mold growth.  The next most common sites of mold growth are the bathroom and the kitchen.  Outdoor (Seasonal) Mold Control  Positive outdoor molds via skin testing: Alternaria, Cladosporium, Bipolaris (Helminthsporium), Drechslera (Curvalaria), and Mucor  Use air conditioning and keep windows closed Avoid exposure to decaying vegetation. Avoid leaf raking. Avoid grain handling. Consider wearing a face mask if working in moldy areas.    Indoor (Perennial) Mold Control   Positive indoor molds via skin testing: Aspergillus, Penicillium, Fusarium, Aureobasidium (Pullulara), and Rhizopus  Maintain humidity below 50%. Clean washable surfaces with 5% bleach solution. Remove sources e.g. contaminated carpets.    Control of Dust Mite Allergen    Dust mites play a major role in allergic asthma and rhinitis.  They occur in environments with high humidity wherever human skin is found.  Dust mites absorb humidity from the atmosphere (ie, they do not drink) and feed on organic matter (including shed human and animal skin).  Dust  mites are a microscopic type of insect that you cannot see with the naked eye.  High levels of dust mites have been detected from mattresses, pillows, carpets, upholstered furniture, bed covers, clothes, soft toys and any woven material.  The principal allergen of the dust mite is found in its feces.  A gram of dust may contain 1,000 mites and 250,000 fecal particles.  Mite antigen is easily measured in the air during house cleaning activities.  Dust mites do not bite and  do not cause harm to humans, other than by triggering allergies/asthma.    Ways to decrease your exposure to dust mites in your home:  Encase mattresses, box springs and pillows with a mite-impermeable barrier or cover   Wash sheets, blankets and drapes weekly in hot water (130 F) with detergent and dry them in a dryer on the hot setting.  Have the room cleaned frequently with a vacuum cleaner and a damp dust-mop.  For carpeting or rugs, vacuuming with a vacuum cleaner equipped with a high-efficiency particulate air (HEPA) filter.  The dust mite allergic individual should not be in a room which is being cleaned and should wait 1 hour after cleaning before going into the room. Do not sleep on upholstered furniture (eg, couches).   If possible removing carpeting, upholstered furniture and drapery from the home is ideal.  Horizontal blinds should be eliminated in the rooms where the person spends the most time (bedroom, study, television room).  Washable vinyl, roller-type shades are optimal. Remove all non-washable stuffed toys from the bedroom.  Wash stuffed toys weekly like sheets and blankets above.   Reduce indoor humidity to less than 50%.  Inexpensive humidity monitors can be purchased at most hardware stores.  Do not use a humidifier as can make the problem worse and are not recommended.  Control of Dog or Cat Allergen  Avoidance is the best way to manage a dog or cat allergy. If you have a dog or cat and are allergic to dog or cats, consider removing the dog or cat from the home. If you have a dog or cat but don't want to find it a new home, or if your family wants a pet even though someone in the household is allergic, here are some strategies that may help keep symptoms at bay:  Keep the pet out of your bedroom and restrict it to only a few rooms. Be advised that keeping the dog or cat in only one room will not limit the allergens to that room. Don't pet, hug or kiss the dog or cat; if you  do, wash your hands with soap and water. High-efficiency particulate air (HEPA) cleaners run continuously in a bedroom or living room can reduce allergen levels over time. Regular use of a high-efficiency vacuum cleaner or a central vacuum can reduce allergen levels. Giving your dog or cat a bath at least once a week can reduce airborne allergen.  Control of Cockroach Allergen  Cockroach allergen has been identified as an important cause of acute attacks of asthma, especially in urban settings.  There are fifty-five species of cockroach that exist in the [place], however only three, the [place], [place] species produce allergen that can affect patients with Asthma.  Allergens can be obtained from fecal particles, egg casings and secretions from cockroaches.    Remove food sources. Reduce access to water. Seal access and entry points. Spray runways with 0.5-1% Diazinon or Chlorpyrifos Blow boric acid power under stoves  and refrigerator. Place bait stations (hydramethylnon) at feeding sites.  Allergy Shots   Allergies are the result of a chain reaction that starts in the immune system. Your immune system controls how your body defends itself. For instance, if you have an allergy to pollen, your immune system identifies pollen as an invader or allergen. Your immune system overreacts by producing antibodies called Immunoglobulin E (IgE). These antibodies travel to cells that release chemicals, causing an allergic reaction.  The concept behind allergy immunotherapy, whether it is received in the form of shots or tablets, is that the immune system can be desensitized to specific allergens that trigger allergy symptoms. Although it requires time and patience, the payback can be long-term relief.  How Do Allergy Shots Work?  Allergy shots work much like a vaccine. Your body responds to injected amounts of a particular allergen given in increasing doses, eventually developing a  resistance and tolerance to it. Allergy shots can lead to decreased, minimal or no allergy symptoms.  There generally are two phases: build-up and maintenance. Build-up often ranges from three to six months and involves receiving injections with increasing amounts of the allergens. The shots are typically given once or twice a week, though more rapid build-up schedules are sometimes used.  The maintenance phase begins when the most effective dose is reached. This dose is different for each person, depending on how allergic you are and your response to the build-up injections. Once the maintenance dose is reached, there are longer periods between injections, typically two to four weeks.  Occasionally doctors give cortisone-type shots that can temporarily reduce allergy symptoms. These types of shots are different and should not be confused with allergy immunotherapy shots.  Who Can Be Treated with Allergy Shots?  Allergy shots may be a good treatment approach for people with allergic rhinitis (hay fever), allergic asthma, conjunctivitis (eye allergy) or stinging insect allergy.   Before deciding to begin allergy shots, you should consider:   The length of allergy season and the severity of your symptoms  Whether medications and/or changes to your environment can control your symptoms  Your desire to avoid long-term medication use  Time: allergy immunotherapy requires a major time commitment  Cost: may vary depending on your insurance coverage  Allergy shots for children age 9 and older are effective and often well tolerated. They might prevent the onset of new allergen sensitivities or the progression to asthma.  Allergy shots are not started on patients who are pregnant but can be continued on patients who become pregnant while receiving them. In some patients with other medical conditions or who take certain common medications, allergy shots may be of risk. It is important to mention other  medications you talk to your allergist.   When Will I Feel Better?  Some may experience decreased allergy symptoms during the build-up phase. For others, it may take as long as 12 months on the maintenance dose. If there is no improvement after a year of maintenance, your allergist will discuss other treatment options with you.  If you aren't responding to allergy shots, it may be because there is not enough dose of the allergen in your vaccine or there are missing allergens that were not identified during your allergy testing. Other reasons could be that there are high levels of the allergen in your environment or major exposure to non-allergic triggers like tobacco smoke.  What Is the Length of Treatment?  Once the maintenance dose is reached, allergy shots are generally  continued for three to five years. The decision to stop should be discussed with your allergist at that time. Some people may experience a permanent reduction of allergy symptoms. Others may relapse and a longer course of allergy shots can be considered.  What Are the Possible Reactions?  The two types of adverse reactions that can occur with allergy shots are local and systemic. Common local reactions include very mild redness and swelling at the injection site, which can happen immediately or several hours after. A systemic reaction, which is less common, affects the entire body or a particular body system. They are usually mild and typically respond quickly to medications. Signs include increased allergy symptoms such as sneezing, a stuffy nose or hives.  Rarely, a serious systemic reaction called anaphylaxis can develop. Symptoms include swelling in the throat, wheezing, a feeling of tightness in the chest, nausea or dizziness. Most serious systemic reactions develop within 30 minutes of allergy shots. This is why it is strongly recommended you wait in your doctor's office for 30 minutes after your injections. Your allergist is  trained to watch for reactions, and his or her staff is trained and equipped with the proper medications to identify and treat them.  Who Should Administer Allergy Shots?  The preferred location for receiving shots is your prescribing allergist's office. Injections can sometimes be given at another facility where the physician and staff are trained to recognize and treat reactions, and have received instructions by your prescribing allergist.

## 2022-05-10 NOTE — Progress Notes (Signed)
NEW PATIENT  Date of Service/Encounter:  05/10/22  Consult requested by: Fayrene Helper, MD   Assessment:   Seasonal and perennial allergic rhinitis (grasses, ragweed, weeds, trees, indoor molds, outdoor molds, dust mites, cat, and cockroach)  Flexural atopic dermatitis  Food intolerance   Complicated past medical history including depression and transaminitis  Plan/Recommendations:   1. Seasonal and perennial allergic rhinitis - Testing today showed: grasses, ragweed, weeds, trees, indoor molds, outdoor molds, dust mites, cat, and cockroach - Copy of test results provided.  - Avoidance measures provided. - Stop taking: Claritin - Continue with: Flonase (fluticasone) one spray per nostril TWICE daily (AIM FOR EAR ON EACH SIDE) - Start taking: Xyzal (levocetirizine) 5mg  tablet once daily and Astelin (azelastine) 2 sprays per nostril TWICE daily as needed - You can use an extra dose of the antihistamine, if needed, for breakthrough symptoms.  - Consider nasal saline rinses 1-2 times daily to remove allergens from the nasal cavities as well as help with mucous clearance (this is especially helpful to do before the nasal sprays are given) - Consider allergy shots as a means of long-term control. - Allergy shots "re-train" and "reset" the immune system to ignore environmental allergens and decrease the resulting immune response to those allergens (sneezing, itchy watery eyes, runny nose, nasal congestion, etc).    - Allergy shots improve symptoms in 75-85% of patients.  - We can discuss more at the next appointment if the medications are not working for you.  2. Flexural atopic dermatitis - Continue with all of the creams per Dr. Kemper Durie. - Allergy shots will help with the eczema as well, so consider this. - We can send in refills if you would like.   3. Food intolerance - Testing was slightly reactive to soy and hazelnut. - I am not sure how relevant they are since  they are so small and you have never reacted to them in the past. - You can certainly avoid them if you would like to, but I do not think that this is necessary.   4. Return in about 3 months (around 08/09/2022).    This note in its entirety was forwarded to the Provider who requested this consultation.  Subjective:   Gabriel [name] is a 46 y.o. male presenting today for evaluation of  Chief Complaint  Patient presents with   Allergy Testing    Environmental and Food     Gabriel [name] has a history of the following: Patient Active Problem List   Diagnosis Date Noted   Vertigo 02/12/2022   Fatigue 09/02/2021   Intractable migraine without aura and with status migrainosus 07/16/2020   Migraine 07/11/2020   Infection of toenail 03/15/2020   Tinea versicolor 03/15/2020   Prediabetes 01/31/2020   Dyslipidemia, goal LDL below 100 06/14/2019   Right lumbar radiculopathy 05/31/2019   Elevated LFTs 05/31/2019   Current moderate episode of major depressive disorder without prior episode ([place]) 05/12/2019   Discolored nails 11/01/2018   Anxiety 01/29/2018   Depression, major, single episode, severe ([place]) 01/29/2018   Vitamin D deficiency 01/29/2018   Acne vulgaris 01/15/2018   Seborrheic dermatitis 01/15/2018   Traumatic injury to skin or subcutaneous tissue 01/15/2018   Bilateral carpal tunnel syndrome 01/14/2018   Insomnia 07/11/2017   Chronic hand pain 07/10/2017   Encounter for annual physical exam 02/21/2016   Overweight (BMI 25.0-29.9) 07/03/2013   IGT (impaired glucose tolerance) 10/31/2012   Perennial allergic rhinitis 10/19/2012   Intrinsic atopic dermatitis 10/19/2012  History obtained from: chart review and patient.  Gabriel [name] was referred by Fayrene Helper, MD.     Gabriel [name] is a 46 y.o. male presenting for an evaluation of allergies and eczema .   Allergic Rhinitis Symptom History: He has not had sinus infections in a couple of years.  He  does have sneezing and congestion. He takes loratadine daily which seems to help. He otherwise does fine. He was getting sinus infections more often before COVID19. He was getting them once every few years or so before [place]. He also has meclizine which he uses as needed for vertigo episodes. He has never been tested in the past for allergies.   Food Allergy Symptom History: He has not had problems with any food allergens at all. He is good with peanuts, tree nuts, eggs, soy, sesame, and wheat. Marland Kitchen He does not like fish.  He has no problems with red meat. He avoids red meat now for health purposes. He did like fish as a child. He tolerates fruits and vegetables without a problems. He does not have problems with seasonings at all. He has two children with food allergens and did not think about these before that.   Skin Symptom History: He has an extensive history of asthma. He has a number of creams for that. He sees Dr. Leonie Green at [name].  He is on Lidex which he uses intermittently. This is mostly when he starts to itch. He has hydrocortisone which he uses more often. He has Retin-A for a "breakout". He sees Dr. Leonie Green once per year. He did have biopsy  to make sure that he had eczema.  He has been dealing with eczema for years.   He has mirtazapine which he uses nightly for sleeping. He has a history of neuropathy in his legs. He has carpal tunnel in both of his hands. He might have to have surgery. He has a history of elevated LFTs. They tend to fluctuate. He is going to have a colonoscopy next year. He is on Rexulti, which is an antidepressant. He is also on venlafaxine.   Otherwise, there is no history of other atopic diseases, including drug allergies, stinging insect allergies, or contact dermatitis. There is no significant infectious history. Vaccinations are up to date.    Past Medical History: Patient Active Problem List   Diagnosis Date Noted   Vertigo 02/12/2022   Fatigue  09/02/2021   Intractable migraine without aura and with status migrainosus 07/16/2020   Migraine 07/11/2020   Infection of toenail 03/15/2020   Tinea versicolor 03/15/2020   Prediabetes 01/31/2020   Dyslipidemia, goal LDL below 100 06/14/2019   Right lumbar radiculopathy 05/31/2019   Elevated LFTs 05/31/2019   Current moderate episode of major depressive disorder without prior episode ([place]) 05/12/2019   Discolored nails 11/01/2018   Anxiety 01/29/2018   Depression, major, single episode, severe ([place]) 01/29/2018   Vitamin D deficiency 01/29/2018   Acne vulgaris 01/15/2018   Seborrheic dermatitis 01/15/2018   Traumatic injury to skin or subcutaneous tissue 01/15/2018   Bilateral carpal tunnel syndrome 01/14/2018   Insomnia 07/11/2017   Chronic hand pain 07/10/2017   Encounter for annual physical exam 02/21/2016   Overweight (BMI 25.0-29.9) 07/03/2013   IGT (impaired glucose tolerance) 10/31/2012   Perennial allergic rhinitis 10/19/2012   Intrinsic atopic dermatitis 10/19/2012    Medication List:  Allergies as of 05/10/2022   No Known Allergies      Medication List  Accurate as of May 10, 2022  4:21 PM. If you have any questions, ask your nurse or doctor.          STOP taking these medications    Xhance 93 MCG/ACT Exhu Generic drug: Fluticasone Propionate Stopped by: Valentina Shaggy, MD       TAKE these medications    fluocinonide ointment 0.05 % Commonly known as: LIDEX Apply twice daily to back for 2 weeks, then daily for 2 weeks, then every other day for 2 weeks   gabapentin 100 MG capsule Commonly known as: NEURONTIN Take 1 capsule (100 mg total) by mouth at bedtime for 3 days, THEN 2 capsules (200 mg total) at bedtime for 3 days. Start taking on: April 03, 2022   gabapentin 300 MG capsule Commonly known as: NEURONTIN Take 1 capsule (300 mg total) by mouth at bedtime. Start after completing 200 mg at night for 3 days    hydrocortisone 2.5 % cream APPLY TO FACE 2 TIMES DAILY FOR 3 WEEKS, THEN DAILY FOR 3 WEEKS, THEN EVERY OTHER DAY FOR 3 WEEKS THEN STOP. REPEAT AS NEEDED.   ketoconazole 2 % shampoo Commonly known as: NIZORAL Apply to face twice weekly and back daily x 1 wk, then twice weekly.   loratadine 10 MG tablet Commonly known as: CLARITIN Take 1 tablet (10 mg total) by mouth daily.   meclizine 25 MG tablet Commonly known as: ANTIVERT Take 1 tablet (25 mg total) by mouth 3 (three) times daily as needed for dizziness.   mirtazapine 30 MG tablet Commonly known as: REMERON Take 1 tablet (30 mg total) by mouth at bedtime.   Rexulti 3 MG Tabs Generic drug: Brexpiprazole Take 1 tablet (3 mg total) by mouth daily.   sodium chloride 0.65 % nasal spray Commonly known as: OCEAN PLACE 1 SPRAY INTO THE NOSE AS NEEDED FOR CONGESTION.   topiramate 100 MG tablet Commonly known as: Topamax Take 1 tablet (100 mg total) by mouth 2 (two) times daily.   tretinoin 0.025 % cream Commonly known as: RETIN-A Apply 1 application topically at bedtime.   venlafaxine XR 150 MG 24 hr capsule Commonly known as: EFFEXOR-XR Take 1 capsule (150 mg total) by mouth daily.   venlafaxine XR 75 MG 24 hr capsule Commonly known as: EFFEXOR-XR Take 1 capsule (75 mg total) by mouth daily. Total of 225 mg daily. Take along with 150 mg cap        Birth History: non-contributory  Developmental History: non-contributory  Past Surgical History: Past Surgical History:  Procedure Laterality Date   NO PAST SURGERIES       Family History: Family History  Problem Relation Age of Onset   Allergic rhinitis Mother    Hypertension Mother    Diabetes Father    Hypertension Father    Hypertension Maternal Grandfather    Diabetes Maternal Grandfather    Cancer Paternal Grandmother        type unknown   Hypertension Paternal Grandmother    Cancer Paternal Grandfather        type unknown   Hypertension Paternal  Grandfather      Social History: Conlee lives at home with his family including his wife and sons.  They live in a house that is 64-year-old.  There is electric vinyl plank in the main living areas and carpeting in bedroom.  They have a heat pump for heating and cooling.  There are no indoor outdoor animals.  There are dust mite covers on  the back of the pillows.  There is no tobacco exposure.  He is not exposed to fumes, chemicals, or dust.  They do use a HEPA filter.  [place] interstate[place]   Review of Systems  Constitutional: Negative.  Negative for chills, fever, malaise/fatigue and weight loss.  HENT:  Positive for congestion. Negative for ear discharge and ear pain.        Positive for postnasal drip.   Eyes:  Negative for pain, discharge and redness.  Respiratory:  Negative for cough, sputum production, shortness of breath and wheezing.   Cardiovascular: Negative.  Negative for chest pain and palpitations.  Gastrointestinal:  Negative for abdominal pain, heartburn, nausea and vomiting.  Skin:  Positive for itching and rash.  Neurological:  Negative for dizziness and headaches.  Endo/Heme/Allergies:  Positive for environmental allergies. Does not bruise/bleed easily.       Objective:   Blood pressure 124/80, pulse 78, temperature 98.2 F (36.8 C), temperature source Temporal, resp. rate 16, weight 204 lb 3.2 oz (92.6 kg), SpO2 96 %. Body mass index is 27.69 kg/m.     Physical Exam Vitals reviewed.  Constitutional:      Appearance: He is well-developed.  HENT:     Head: Normocephalic and atraumatic.     Right Ear: Tympanic membrane, ear canal and external ear normal. No drainage, swelling or tenderness. Tympanic membrane is not injected, scarred, erythematous, retracted or bulging.     Left Ear: Tympanic membrane, ear canal and external ear normal. No drainage, swelling or tenderness. Tympanic membrane is not injected, scarred, erythematous, retracted or  bulging.     Nose: No nasal deformity, septal deviation, mucosal edema or rhinorrhea.     Right Turbinates: Enlarged, swollen and pale.     Left Turbinates: Enlarged, swollen and pale.     Right Sinus: No maxillary sinus tenderness or frontal sinus tenderness.     Left Sinus: No maxillary sinus tenderness or frontal sinus tenderness.     Comments: No nasal polyps noted.     Mouth/Throat:     Mouth: Mucous membranes are not pale and not dry.     Pharynx: Uvula midline.  Eyes:     General:        Right eye: No discharge.        Left eye: No discharge.     Conjunctiva/sclera: Conjunctivae normal.     Right eye: Right conjunctiva is not injected. No chemosis.    Left eye: Left conjunctiva is not injected. No chemosis.    Pupils: Pupils are equal, round, and reactive to light.  Cardiovascular:     Rate and Rhythm: Normal rate and regular rhythm.     Heart sounds: Normal heart sounds.  Pulmonary:     Effort: Pulmonary effort is normal. No tachypnea, accessory muscle usage or respiratory distress.     Breath sounds: Normal breath sounds. No wheezing, rhonchi or rales.  Chest:     Chest wall: No tenderness.  Abdominal:     Tenderness: There is no abdominal tenderness. There is no guarding or rebound.  Lymphadenopathy:     Head:     Right side of head: No submandibular, tonsillar or occipital adenopathy.     Left side of head: No submandibular, tonsillar or occipital adenopathy.     Cervical: No cervical adenopathy.  Skin:    General: Skin is warm.     Capillary Refill: Capillary refill takes less than 2 seconds.  Coloration: Skin is not pale.     Findings: Rash present. No abrasion, erythema or petechiae. Rash is not papular, urticarial or vesicular.     Comments: He does have several hyperpigmented lesions over his entire body, especially on his back. He has some scarring on his face secondary to either eczema or acne.   Neurological:     Mental Status: He is alert.       Diagnostic studies:   Allergy Studies:     Airborne Adult Perc - 05/10/22 1520     Time Antigen Placed 1500    Allergen Manufacturer Lavella Hammock    Location Back    Number of Test 59    1. Control-Buffer 50% Glycerol Negative    2. Control-Histamine 1 mg/ml 2+    3. Albumin saline Negative    4. [place] Negative    5. [place] 3+    6. [place] Negative    7. [place] Blue Negative    8. Meadow Fescue 2+    9. Perennial Rye Negative    10. Sweet Vernal Negative    11. Timothy 3+    12. Cocklebur Negative    13. Burweed Marshelder Negative    14. Ragweed, short 4+    15. Ragweed, Giant Negative    16. Plantain,  English Negative    17. Lamb's Quarters Negative    18. Sheep Sorrell Negative    19. Rough Pigweed Negative    20. Marsh Elder, Rough 2+    21. Mugwort, Common Negative    22. Ash mix Negative    23. Birch mix 4+    24. Beech American 4+    25. Box, Elder 4+    26. Cedar, red 3+    27. Cottonwood, [place] Negative    28. Elm mix Negative    29. Hickory Negative    30. Maple mix 3+    31. Oak, [place] mix 3+    32. Pecan Pollen 3+    33. Pine mix Negative    34. Sycamore Eastern Negative    35. [place], Black Pollen Negative    36. Alternaria alternata Negative    37. Cladosporium Herbarum Negative    38. Aspergillus mix Negative    39. Penicillium mix Negative    40. Bipolaris sorokiniana (Helminthosporium) Negative    41. Drechslera spicifera (Curvularia) Negative    42. Mucor plumbeus Negative    43. Fusarium moniliforme Negative    44. Aureobasidium pullulans (pullulara) Negative    45. Rhizopus oryzae Negative    46. Botrytis cinera Negative    47. Epicoccum nigrum Negative    48. Phoma betae Negative    49. Candida Albicans Negative    50. Trichophyton mentagrophytes Negative    51. Mite, D Farinae  5,000 AU/ml Negative    52. Mite, D Pteronyssinus  5,000 AU/ml Negative    53. Cat Hair 10,000 BAU/ml Negative    54.  Dog Epithelia Negative    55.  Mixed Feathers Negative    56. Horse Epithelia Negative    57. Cockroach, German Negative    58. Mouse Negative    59. Tobacco Leaf Negative             Intradermal - 05/10/22 1523     Time Antigen Placed 1530    Allergen Manufacturer Lavella Hammock    Location Arm    Number of Test 8    Control Negative    Mold 1 4+    Mold  2 3+    Mold 3 2+    Mold 4 2+    Cat 2+    Dog Negative    Cockroach 2+    Mite mix 3+             Food Adult Perc - 05/10/22 1500     Time Antigen Placed 1500    Allergen Manufacturer Greer    Location Back    Number of allergen test 34     Control-buffer 50% Glycerol Negative    Control-Histamine 1 mg/ml 2+    1. Peanut Negative    2. Soybean --   5x7   3. Wheat Negative    4. Sesame Negative    5. Milk, cow Negative    6. Egg White, Chicken Negative    7. Casein Negative    8. Shellfish Mix Negative    9. Fish Mix Negative    10. Cashew Negative    11. Pecan Food Negative    12. Walnut Food Negative    13. Almond Negative    14. Hazelnut --   5x8   15. [place] nut Negative    16. Coconut Negative    17. Pistachio Negative    18. Catfish Negative    19. Bass Negative    20. Trout Negative    21. Tuna Negative    22. Salmon Negative    23. Flounder Negative    24. Codfish Negative    25. Shrimp Negative    26. Crab Negative    27. Lobster Negative    28. Oyster Negative    29. Scallops Negative    37. Pork Negative    38. [place] Meat Negative    39. Chicken Meat Negative    40. Beef Negative    41. Lamb Negative    64. Chocolate/Cacao bean Negative             Allergy testing results were read and interpreted by myself, documented by clinical staff.         Malachi Bonds, MD [facility]

## 2022-05-13 NOTE — Progress Notes (Unsigned)
Virtual Visit via Video Note  I connected with Gabriel [name] on 05/15/22 at  1:40 PM EST by a video enabled telemedicine application and verified that I am speaking with the correct person using two identifiers.  Location: Patient: home Provider: office Persons participated in the visit- patient, provider    I discussed the limitations of evaluation and management by telemedicine and the availability of in person appointments. The patient expressed understanding and agreed to proceed.    I discussed the assessment and treatment plan with the patient. The patient was provided an opportunity to ask questions and all were answered. The patient agreed with the plan and demonstrated an understanding of the instructions.   The patient was advised to call back or seek an in-person evaluation if the symptoms worsen or if the condition fails to improve as anticipated.  I provided 15 minutes of non-face-to-face time during this encounter.   [name], MD    [facility] MD/PA/NP OP Progress Note  05/15/2022 2:08 PM Gabriel [name]  MRN:  [id number]  Chief Complaint:  Chief Complaint  Patient presents with   Follow-up   HPI:  This is a follow-up appointment for depression, anxiety and insomnia.  He states that he definitely thinks gabapentin has been helpful.  Although he still has numbness in his leg, it is not noticeable as much compared to before.  He has been going outside in the porch, listening to birds and saying sunshine.  He feels calm.  He had a good Christmas with his family.  He has been able to go out of his room more frequently.  When he is asked of him rocking back and forth during the visit, he states that it is a mixture of feeling anxious and pain.  He did not recognized this until it was mentioned.  He was able to sleep good a few days ago, although he continues to struggle with insomnia.  He denies change in appetite.  He denies feeling significantly down.  He denies SI.  He  denies alcohol use or drug use.  He feels a little drowsy when he wakes up in the morning since starting gabapentin; he agrees to change regimen at this time.    Wt Readings from Last 3 Encounters:  05/10/22 204 lb 3.2 oz (92.6 kg)  04/03/22 202 lb 0.6 oz (91.6 kg)  02/12/22 197 lb (89.4 kg)     Visit Diagnosis:    ICD-10-CM   1. Insomnia, unspecified type  G47.00     2. Anxiety  F41.9 venlafaxine XR (EFFEXOR-XR) 150 MG 24 hr capsule    3. MDD (major depressive disorder), single episode, mild (HCC)  F32.0 venlafaxine XR (EFFEXOR-XR) 150 MG 24 hr capsule      Past Psychiatric History: Please see initial evaluation for full details. I have reviewed the history. No updates at this time.     Past Medical History:  Past Medical History:  Diagnosis Date   Anxiety    Carpal tunnel syndrome 2018   Depression    Depression    Phreesia 05/15/2020   Eczema    Elevated LFTs    Heart murmur    Insomnia    Seasonal allergies    Vitamin D deficiency     Past Surgical History:  Procedure Laterality Date   NO PAST SURGERIES      Family Psychiatric History: Please see initial evaluation for full details. I have reviewed the history. No updates at this time.     Family  History:  Family History  Problem Relation Age of Onset   Allergic rhinitis Mother    Hypertension Mother    Diabetes Father    Hypertension Father    Hypertension Maternal Grandfather    Diabetes Maternal Grandfather    Cancer Paternal Grandmother        type unknown   Hypertension Paternal Grandmother    Cancer Paternal Grandfather        type unknown   Hypertension Paternal Grandfather     Social History:  Social History   Socioeconomic History   Marital status: Married    Spouse name: Not on file   Number of children: 2   Years of education: 12   Highest education level: High school graduate  Occupational History   Occupation: not employed  Tobacco Use   Smoking status: Never    Passive  exposure: Never   Smokeless tobacco: Never  Vaping Use   Vaping Use: Never used  Substance and Sexual Activity   Alcohol use: No    Alcohol/week: 0.0 standard drinks of alcohol   Drug use: No   Sexual activity: Yes    Birth control/protection: None  Other Topics Concern   Not on file  Social History Narrative   He lives with wife and two children.   Unemployed at this present time   Right handed   One story home   Highest level of education:  12th grade   Social Determinants of Health   Financial Resource Strain: Medium Risk (09/08/2017)   Overall Financial Resource Strain (CARDIA)    Difficulty of Paying Living Expenses: Somewhat hard  Food Insecurity: No Food Insecurity (09/08/2017)   Hunger Vital Sign    Worried About Running Out of Food in the Last Year: Never true    Ran Out of Food in the Last Year: Never true  Transportation Needs: No Transportation Needs (09/08/2017)   PRAPARE - Administrator, Civil Service (Medical): No    Lack of Transportation (Non-Medical): No  Physical Activity: Not on file  Stress: Stress Concern Present (09/08/2017)   [employer] of Occupational Health - Occupational Stress Questionnaire    Feeling of Stress : Very much  Social Connections: Not on file    Allergies: No Known Allergies  Metabolic Disorder Labs: Lab Results  Component Value Date   HGBA1C 6.2 (H) 04/02/2022   MPG 137 05/27/2019   MPG 137 12/29/2018   No results found for: "PROLACTIN" Lab Results  Component Value Date   CHOL 170 04/02/2022   TRIG 74 04/02/2022   HDL 43 04/02/2022   CHOLHDL 4.0 04/02/2022   VLDL 10 12/04/2016   LDLCALC 113 (H) 04/02/2022   LDLCALC 132 (H) 09/17/2021   Lab Results  Component Value Date   TSH 1.790 09/17/2021   TSH 2.450 07/20/2020    Therapeutic Level Labs: No results found for: "LITHIUM" No results found for: "VALPROATE" No results found for: "CBMZ"  Current Medications: Current Outpatient Medications   Medication Sig Dispense Refill   gabapentin (NEURONTIN) 100 MG capsule Take 1 capsule (100 mg total) by mouth 3 (three) times daily. 90 capsule 0   azelastine (ASTELIN) 0.1 % nasal spray Place 1 spray into both nostrils 2 (two) times daily. Use in each nostril as directed 30 mL 5   Brexpiprazole (REXULTI) 3 MG TABS Take 1 tablet (3 mg total) by mouth daily. 90 tablet 1   fluocinonide ointment (LIDEX) 0.05 % Apply twice daily to back for 2  weeks, then daily for 2 weeks, then every other day for 2 weeks 60 g 3   fluticasone (FLONASE) 50 MCG/ACT nasal spray Place 1 spray into both nostrils daily. 16 g 5   hydrocortisone 2.5 % cream APPLY TO FACE 2 TIMES DAILY FOR 3 WEEKS, THEN DAILY FOR 3 WEEKS, THEN EVERY OTHER DAY FOR 3 WEEKS THEN STOP. REPEAT AS NEEDED. 30 g 3   ketoconazole (NIZORAL) 2 % shampoo Apply to face twice weekly and back daily x 1 wk, then twice weekly. 120 mL 11   levocetirizine (XYZAL) 5 MG tablet Take 1 tablet (5 mg total) by mouth every evening. 90 tablet 2   meclizine (ANTIVERT) 25 MG tablet Take 1 tablet (25 mg total) by mouth 3 (three) times daily as needed for dizziness. 30 tablet 1   [START ON 05/29/2022] mirtazapine (REMERON) 30 MG tablet Take 1 tablet (30 mg total) by mouth at bedtime. 90 tablet 0   topiramate (TOPAMAX) 100 MG tablet Take 1 tablet (100 mg total) by mouth 2 (two) times daily. 180 tablet 1   tretinoin (RETIN-A) 0.025 % cream Apply 1 application topically at bedtime.     [START ON 05/28/2022] venlafaxine XR (EFFEXOR-XR) 150 MG 24 hr capsule Take 1 capsule (150 mg total) by mouth daily. 90 capsule 1   venlafaxine XR (EFFEXOR-XR) 75 MG 24 hr capsule Take 1 capsule (75 mg total) by mouth daily. Total of 225 mg daily. Take along with 150 mg cap 90 capsule 1   No current facility-administered medications for this visit.     Musculoskeletal: Strength & Muscle Tone:  N/A Gait & Station:  N/A Patient leans: N/A  Psychiatric Specialty Exam: Review of Systems   Psychiatric/Behavioral:  Positive for dysphoric mood and sleep disturbance. Negative for agitation, behavioral problems, confusion, decreased concentration, hallucinations, self-injury and suicidal ideas. The patient is nervous/anxious. The patient is not hyperactive.   All other systems reviewed and are negative.   There were no vitals taken for this visit.There is no height or weight on file to calculate BMI.  General Appearance: Fairly Groomed  Eye Contact:  Good  Speech:  Clear and Coherent  Volume:  Normal  Mood:   ok  Affect:  Appropriate, Congruent, and calm  Thought Process:  Coherent  Orientation:  Full (Time, Place, and Person)  Thought Content: Logical   Suicidal Thoughts:  No  Homicidal Thoughts:  No  Memory:  Immediate;   Good  Judgement:  Good  Insight:  Good  Psychomotor Activity:  Normal  Concentration:  Concentration: Good and Attention Span: Good  Recall:  Good  Fund of Knowledge: Good  Language: Good  Akathisia:  No  Handed:  Right  AIMS (if indicated): not done  Assets:  Communication Skills Desire for Improvement  ADL's:  Intact  Cognition: WNL  Sleep:  Fair   Screenings: GAD-7    Health and safety inspector from 05/08/2022 in [place] [facility] from 04/24/2022 in [place] [facility] from 04/10/2022 in [place] [facility] from 02/27/2022 in [place] from 01/30/2022 in [facility]  Total GAD-7 Score 7 7 6 11 12       PHQ2-9    [place] Office Visit from 04/03/2022 in [facility] Office Visit from 02/12/2022 in [place] from 01/30/2022 in [place] from 12/20/2021 in [place] Office Visit  from  11/30/2021 in [facility]  PHQ-2 Total Score 2 2 2 2 2   PHQ-9 Total Score 11 10 9 10 11       Flowsheet Row Counselor from [facility]/20/2021 in [facility] [facility] from 11/03/2020 in [facility] [place] Video Visit from 10/26/2020 in [facility]  C-SSRS RISK CATEGORY No Risk No Risk No Risk        Assessment and Plan:  Kadrian Partch is a 47 y.o. year old male with a history of depression, bilateral carpel tunnel syndrome, who presents for follow up appointment for below.   1. Anxiety 2. MDD (major depressive disorder), single episode, mild (HCC) There has been slow but steady improvement in depressive symptoms and anxiety as his neuropathic pain improves/after starting gabapentin. Psychosocial stressors includes unemployment, loss of his maternal aunt, migraine, and pain secondary to carpal tunnel syndrome.  Will change regimen of gabapentin to mitigate side effect of drowsiness.  This medication is started to target neuropathic pain, anxiety and insomnia.  Will continue venlafaxine, mirtazapine and rexulti to target depression. Noted that although it was discussed to consider TMS, he is not interested in this option at this time.  He will continue to see Ms. Bynum for therapy.   3. Insomnia, unspecified type Slightly improving. Sleep evaluation was not consistent with sleep apnea.  Will continue gabapentin as described above.      Plan  Continue venlafaxine 225 mg (150 mg + 75 mg) daily Continue mirtazapine 30 mg at night  Continue Rexulti 3 mg at night - monitor drowsiness  Change gabapentin 100 mg three times a day - changed from 300 mg at night Next appointment: 1/31 at 2:30 , video [email or web address].   - sleep study, not consistent with sleep apnea in 2023 - on tramadol   - on topiramate for migraine   Past trials of medication: sertraline, fluoxetine,  mirtazapine, nortriptyline for carpel tunnel syndrome, bupropion (drowsiness), quetiapine (drowsiness), Abilify ("off balance"), Trazodone, Ambien (limited benefit), lorazepam (worsening in anxiety, drowsiness) , temazepam, Lunesta        Collaboration of Care: Collaboration of Care: Other reviewed notes in Epic  Patient/Guardian was advised Release of Information must be obtained prior to any record release in order to collaborate their care with an outside provider. Patient/Guardian was advised if they have not already done so to contact the registration department to sign all necessary forms in order for 2/31 to release information regarding their care.   Consent: Patient/Guardian gives verbal consent for treatment and assignment of benefits for services provided during this visit. Patient/Guardian expressed understanding and agreed to proceed.    2024, MD 05/15/2022, 2:08 PM

## 2022-05-14 ENCOUNTER — Other Ambulatory Visit (HOSPITAL_COMMUNITY): Payer: Self-pay

## 2022-05-15 ENCOUNTER — Encounter: Payer: Self-pay | Admitting: Psychiatry

## 2022-05-15 ENCOUNTER — Other Ambulatory Visit (HOSPITAL_COMMUNITY): Payer: Self-pay

## 2022-05-15 ENCOUNTER — Telehealth (INDEPENDENT_AMBULATORY_CARE_PROVIDER_SITE_OTHER): Payer: Commercial Managed Care - PPO | Admitting: Psychiatry

## 2022-05-15 DIAGNOSIS — F320 Major depressive disorder, single episode, mild: Secondary | ICD-10-CM

## 2022-05-15 DIAGNOSIS — F419 Anxiety disorder, unspecified: Secondary | ICD-10-CM | POA: Diagnosis not present

## 2022-05-15 DIAGNOSIS — G4700 Insomnia, unspecified: Secondary | ICD-10-CM

## 2022-05-15 MED ORDER — MIRTAZAPINE 30 MG PO TABS
30.0000 mg | ORAL_TABLET | Freq: Every day | ORAL | 0 refills | Status: DC
  Filled 2022-05-15 – 2022-05-31 (×2): qty 90, 90d supply, fill #0

## 2022-05-15 MED ORDER — GABAPENTIN 100 MG PO CAPS
100.0000 mg | ORAL_CAPSULE | Freq: Three times a day (TID) | ORAL | 0 refills | Status: DC
  Filled 2022-05-15: qty 90, 30d supply, fill #0

## 2022-05-15 MED ORDER — VENLAFAXINE HCL ER 150 MG PO CP24
150.0000 mg | ORAL_CAPSULE | Freq: Every day | ORAL | 1 refills | Status: DC
  Filled 2022-05-31: qty 90, 90d supply, fill #0

## 2022-05-15 NOTE — Patient Instructions (Signed)
Continue venlafaxine 225 mg (150 mg + 75 mg) daily Continue mirtazapine 30 mg at night  Continue Rexulti 3 mg at night  Change gabapentin 100 mg three times a day  Next appointment: 1/31 at 2:30

## 2022-05-24 ENCOUNTER — Ambulatory Visit
Admission: EM | Admit: 2022-05-24 | Discharge: 2022-05-24 | Disposition: A | Discharge status: Home / Self Care | Payer: Commercial Managed Care - PPO | Attending: Internal Medicine | Admitting: Internal Medicine

## 2022-05-24 DIAGNOSIS — Z112 Encounter for screening for other bacterial diseases: Secondary | ICD-10-CM | POA: Diagnosis not present

## 2022-05-24 DIAGNOSIS — N390 Urinary tract infection, site not specified: Secondary | ICD-10-CM | POA: Diagnosis not present

## 2022-05-24 LAB — POCT URINALYSIS DIP (MANUAL ENTRY)
Bilirubin, UA: NEGATIVE
Glucose, UA: NEGATIVE mg/dL
Nitrite, UA: NEGATIVE
Protein Ur, POC: 100 mg/dL — AB
Spec Grav, UA: 1.025 (ref 1.010–1.025)
Urobilinogen, UA: 0.2 E.U./dL
pH, UA: 6 (ref 5.0–8.0)

## 2022-05-24 LAB — POCT RAPID STREP A (OFFICE): Rapid Strep A Screen: NEGATIVE

## 2022-05-24 MED ORDER — SULFAMETHOXAZOLE-TRIMETHOPRIM 800-160 MG PO TABS: 1.0000 | ORAL_TABLET | Freq: Two times a day (BID) | ORAL | 0 refills | Status: AC

## 2022-05-24 NOTE — ED Provider Notes (Signed)
[facility]    CSN: [id number] Arrival date & time: 05/24/22  1221      History   Chief Complaint No chief complaint on file.   HPI Gabriel [name] is a 47 y.o. male.   Patient presenting today with 3-day history of bilateral low back aching, suprapubic pain, urinary urgency and frequency and now low-grade fevers.  Denies nausea, vomiting, penile discharge, concern for STD, upper respiratory symptoms though does want to be checked for strep as he was recently exposed to strep throat.  So far not trying anything other than ibuprofen with mild relief.    Past Medical History:  Diagnosis Date   Anxiety    Carpal tunnel syndrome 2018   Depression    Depression    Phreesia 05/15/2020   Eczema    Elevated LFTs    Heart murmur    Insomnia    Seasonal allergies    Vitamin D deficiency     Patient Active Problem List   Diagnosis Date Noted   Vertigo 02/12/2022   Fatigue 09/02/2021   Intractable migraine without aura and with status migrainosus 07/16/2020   Migraine 07/11/2020   Infection of toenail 03/15/2020   Tinea versicolor 03/15/2020   Prediabetes 01/31/2020   Dyslipidemia, goal LDL below 100 06/14/2019   Right lumbar radiculopathy 05/31/2019   Elevated LFTs 05/31/2019   Current moderate episode of major depressive disorder without prior episode ([place]) 05/12/2019   Discolored nails 11/01/2018   Anxiety 01/29/2018   Depression, major, single episode, severe ([place]) 01/29/2018   Vitamin D deficiency 01/29/2018   Acne vulgaris 01/15/2018   Seborrheic dermatitis 01/15/2018   Traumatic injury to skin or subcutaneous tissue 01/15/2018   Bilateral carpal tunnel syndrome 01/14/2018   Insomnia 07/11/2017   Chronic hand pain 07/10/2017   Encounter for annual physical exam 02/21/2016   Overweight (BMI 25.0-29.9) 07/03/2013   IGT (impaired glucose tolerance) 10/31/2012   Perennial allergic rhinitis 10/19/2012   Intrinsic atopic dermatitis 10/19/2012     Past Surgical History:  Procedure Laterality Date   NO PAST SURGERIES         Home Medications    Prior to Admission medications   Medication Sig Start Date End Date Taking? Authorizing Provider  sulfamethoxazole-trimethoprim (BACTRIM DS) 800-160 MG tablet Take 1 tablet by mouth 2 (two) times daily for 7 days. 05/24/22 05/31/22 Yes Volney American, PA-C  azelastine (ASTELIN) 0.1 % nasal spray Place 1 spray into both nostrils 2 (two) times daily. Use in each nostril as directed 05/10/22 07/06/22  Valentina Shaggy, MD  Brexpiprazole (REXULTI) 3 MG TABS Take 1 tablet (3 mg total) by mouth daily. 02/18/22   [name], MD  fluocinonide ointment (LIDEX) 0.05 % Apply twice daily to back for 2 weeks, then daily for 2 weeks, then every other day for 2 weeks 09/28/20     fluticasone (FLONASE) 50 MCG/ACT nasal spray Place 1 spray into both nostrils daily. 05/10/22   Valentina Shaggy, MD  gabapentin (NEURONTIN) 100 MG capsule Take 1 capsule (100 mg total) by mouth 3 (three) times daily. 05/15/22 06/16/22  [name], MD  hydrocortisone 2.5 % cream APPLY TO FACE 2 TIMES DAILY FOR 3 WEEKS, THEN DAILY FOR 3 WEEKS, THEN EVERY OTHER DAY FOR 3 WEEKS THEN STOP. REPEAT AS NEEDED. 05/03/21     ketoconazole (NIZORAL) 2 % shampoo Apply to face twice weekly and back daily x 1 wk, then twice weekly. 09/28/20     levocetirizine (XYZAL) 5 MG  tablet Take 1 tablet (5 mg total) by mouth every evening. 05/10/22 08/15/22  Valentina Shaggy, MD  meclizine (ANTIVERT) 25 MG tablet Take 1 tablet (25 mg total) by mouth 3 (three) times daily as needed for dizziness. 02/12/22   Alvira Monday, FNP  mirtazapine (REMERON) 30 MG tablet Take 1 tablet (30 mg total) by mouth at bedtime. 05/29/22 08/27/22  [name], MD  topiramate (TOPAMAX) 100 MG tablet Take 1 tablet (100 mg total) by mouth 2 (two) times daily. 03/02/21   Fayrene Helper, MD  tretinoin (RETIN-A) 0.025 % cream Apply 1 application topically at  bedtime. 10/13/18   [provider]  venlafaxine XR (EFFEXOR-XR) 150 MG 24 hr capsule Take 1 capsule (150 mg total) by mouth daily. 05/28/22 11/24/22  [name], MD  venlafaxine XR (EFFEXOR-XR) 75 MG 24 hr capsule Take 1 capsule (75 mg total) by mouth daily. Total of 225 mg daily. Take along with 150 mg cap 02/22/22 08/21/22  [name], MD    Family History Family History  Problem Relation Age of Onset   Allergic rhinitis Mother    Hypertension Mother    Diabetes Father    Hypertension Father    Hypertension Maternal Grandfather    Diabetes Maternal Grandfather    Cancer Paternal Grandmother        type unknown   Hypertension Paternal Grandmother    Cancer Paternal Grandfather        type unknown   Hypertension Paternal Grandfather     Social History Social History   Tobacco Use   Smoking status: Never    Passive exposure: Never   Smokeless tobacco: Never  Vaping Use   Vaping Use: Never used  Substance Use Topics   Alcohol use: No    Alcohol/week: 0.0 standard drinks of alcohol   Drug use: No     Allergies   Patient has no known allergies.   Review of Systems Review of Systems Per HPI  Physical Exam Triage Vital Signs ED Triage Vitals  Enc Vitals Group     BP 05/24/22 1228 118/78     Pulse Rate 05/24/22 1228 83     Resp 05/24/22 1228 20     Temp 05/24/22 1228 98.2 F (36.8 C)     Temp Source 05/24/22 1228 Oral     SpO2 05/24/22 1228 95 %     Weight --      Height --      Head Circumference --      Peak Flow --      Pain Score 05/24/22 1229 5     Pain Loc --      Pain Edu? --      Excl. in [place]? --    No data found.  Updated Vital Signs BP 118/78 (BP Location: Right Arm)   Pulse 83   Temp 98.2 F (36.8 C) (Oral)   Resp 20   SpO2 95%   Visual Acuity Right Eye Distance:   Left Eye Distance:   Bilateral Distance:    Right Eye Near:   Left Eye Near:    Bilateral Near:     Physical Exam Vitals and nursing note reviewed.   Constitutional:      Appearance: Normal appearance.  HENT:     Head: Atraumatic.     Nose: Nose normal.     Mouth/Throat:     Mouth: Mucous membranes are moist.     Pharynx: Oropharynx is clear. No posterior oropharyngeal erythema.  Eyes:     Extraocular Movements: Extraocular movements intact.     Conjunctiva/sclera: Conjunctivae normal.  Cardiovascular:     Rate and Rhythm: Normal rate and regular rhythm.     Heart sounds: Normal heart sounds.  Pulmonary:     Effort: Pulmonary effort is normal.     Breath sounds: Normal breath sounds.  Abdominal:     General: Bowel sounds are normal. There is no distension.     Palpations: Abdomen is soft.     Tenderness: There is abdominal tenderness. There is no right CVA tenderness, left CVA tenderness or guarding.     Comments: Mild suprapubic tenderness to palpation without distention or guarding  Musculoskeletal:        General: Normal range of motion.     Cervical back: Normal range of motion and neck supple.  Skin:    General: Skin is warm and dry.  Neurological:     General: No focal deficit present.     Mental Status: He is oriented to person, place, and time.  Psychiatric:        Mood and Affect: Mood normal.        Thought Content: Thought content normal.        Judgment: Judgment normal.      UC Treatments / Results  Labs (all labs ordered are listed, but only abnormal results are displayed) Labs Reviewed  POCT URINALYSIS DIP (MANUAL ENTRY) - Abnormal; Notable for the following components:      Result Value   Clarity, UA cloudy (*)    Ketones, POC UA small (15) (*)    Blood, UA large (*)    Protein Ur, POC =100 (*)    Leukocytes, UA Small (1+) (*)    All other components within normal limits  URINE CULTURE  POCT RAPID STREP A (OFFICE)    EKG   Radiology No results found.  Procedures Procedures (including critical care time)  Medications Ordered in UC Medications - No data to display  Initial  Impression / Assessment and Plan / UC Course  I have reviewed the triage vital signs and the nursing notes.  Pertinent labs & imaging results that were available during my care of the patient were reviewed by me and considered in my medical decision making (see chart for details).     Vitals and exam overall very reassuring today, urinalysis with evidence of a possible urinary tract infection.  Treat with Bactrim while awaiting urine culture for further evaluation.  Continue ibuprofen and Tylenol as needed, push fluids.  Return for worsening symptoms. Final Clinical Impressions(s) / UC Diagnoses   Final diagnoses:  Acute lower UTI     Discharge Instructions      Treating you with antibiotics for a possible urinary tract infection today.  I have sent out a urine culture to get more information and someone will call you if anything needs to change with your plan.  Keep the fevers down, drink plenty of fluids and follow-up if your symptoms are not improving or worsening.    ED Prescriptions     Medication Sig Dispense Auth. Provider   sulfamethoxazole-trimethoprim (BACTRIM DS) 800-160 MG tablet Take 1 tablet by mouth 2 (two) times daily for 7 days. 14 tablet Particia Nearing, [place]      PDMP not reviewed this encounter.   Particia Nearing, [place] 05/24/22 1306

## 2022-05-24 NOTE — ED Triage Notes (Signed)
Pt reports low back pain, low abdominal pain, neck pain headache , not fully emptying his bladder, pressure on his bladder and frequency x 3 days. Took tylenol and ibuprofen which gave slight relief.   Had an exposure to strep throat . Not having an symptoms but wants to ensure he doesn't have it

## 2022-05-24 NOTE — Discharge Instructions (Signed)
Treating you with antibiotics for a possible urinary tract infection today.  I have sent out a urine culture to get more information and someone will call you if anything needs to change with your plan.  Keep the fevers down, drink plenty of fluids and follow-up if your symptoms are not improving or worsening.

## 2022-05-26 LAB — URINE CULTURE: Culture: 10000 — AB

## 2022-05-31 ENCOUNTER — Other Ambulatory Visit (HOSPITAL_COMMUNITY): Payer: Self-pay

## 2022-05-31 ENCOUNTER — Other Ambulatory Visit: Payer: Self-pay

## 2022-06-05 ENCOUNTER — Ambulatory Visit (INDEPENDENT_AMBULATORY_CARE_PROVIDER_SITE_OTHER): Payer: Commercial Managed Care - PPO | Admitting: Psychiatry

## 2022-06-05 DIAGNOSIS — F320 Major depressive disorder, single episode, mild: Secondary | ICD-10-CM

## 2022-06-05 NOTE — Progress Notes (Signed)
Virtual Visit via Video Note  I connected with Gabriel [name] on 06/05/22 at 11:11 AM EST by a video enabled telemedicine application and verified that I am speaking with the correct person using two identifiers.  Location: Patient: Home Provider: [place] office    I discussed the limitations of evaluation and management by telemedicine and the availability of in person appointments. The patient expressed understanding and agreed to proceed.  I provided 44 minutes of non-face-to-face time during this encounter.   Gabriel Smoker, LCSW      THERAPIST PROGRESS NOTE    Session Time:  Wednesday 06/05/2021 11:11 AM  11:55 AM   Participation Level: Active  Treatment Goals addressed:   Improve ability to manage stress and anxiety AEB reducing episodes of anxiety (worry & nervousness) from 4 x per week to 1 x per week for 60 days per pt's self-report  Report a decrease in anxiety symptoms as evidenced by an overall reduction in anxiety score by a minimum of 25% on the Generalized Anxiety Disorder Scale for 2 months.     Progress in treatment:  progressing    Interventions: CBT and Supportive  Summary: Gabriel [name] is a 47 y.o. male who is referred for services by PCP Dr. Tula Nakayama due to patient experiencing symptoms of depression. He denies any psychiatric hospitalizations, He saw therapist Youlanda Roys in [place] for a few months. Patient reports symptoms began when he lost his job about a year ago. Per his report, he was fired after he complained about race discrimination. 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 He states now feeling as though he failed his family. He states not wanting to be around anyone and having difficulty engaging with his wife and children. He fears losing his family.  He reports ruminating thoughts about  job and the past, excessive worry depressed mood, tearfulness, anxiety, isolative behaviors, poor motivation, poor concentration, irritability, sleep difficulty, thoughts and feelings of hopelessness and worthlessness. He denies any suicidal ideations.  Patient last was seen via virtual visit about 4 weeks ago. He reports continued low level of intensity/frequency of symptoms of anxiety as reflected in the GAD-7.  He reports having episodes of anxiety about 2 x since last session.  This was triggered by concerns about providing for his family.  He has what if thoughts about disability check being discontinued.  He used self talk and distracting activities to cope with worry thoughts.  He remains engaged with family through eating better and having conversations.  He continues walking outside as well as sitting on the porch frequently and enjoying nature to try to relax.  He reports little other involvement in activity during the day.      SI/HI .Nowithout intent/plan  Therapist Response:  reviewed symptoms, administered GAD-7, discussed results, praised and reinforced patient's recognition of his thoughts and his efforts to use replacement thoughts and distracting activities, assisted patient identify other activities to pursue to increase behavioral activation as well as distract patient from worry, developed plan with patient to listen to music from his favorite artists, also discussed rationale for and assisted patient practice a beach visualization to help cope with stress and anxiety, developed plan with patient to practice, checked out interactive audio activity to patient and provided with access code   Diagnosis: Axis I: MDD, single  episode, severe    Axis II: No diagnosis  Collaboration of Care: Psychiatrist AEB patient working with psychiatrist Dr. Modesta Messing  Patient/Guardian was advised Release of Information must be obtained prior to any record release in order to collaborate their care with  an outside provider. Patient/Guardian was advised if they have not already done so to contact the registration department to sign all necessary forms in order for [place] to release information regarding their care.   Consent: Patient/Guardian gives verbal consent for treatment and assignment of benefits for services provided during this visit. Patient/Guardian expressed understanding and agreed to proceed.   Gabriel Smoker, LCSW 06/05/2022

## 2022-06-11 NOTE — Progress Notes (Unsigned)
Virtual Visit via Video Note  I connected with Gabriel [name] on 06/12/22 at  2:30 PM EST by a video enabled telemedicine application and verified that I am speaking with the correct person using two identifiers.  Location: Patient: home Provider: office Persons participated in the visit- patient, provider    I discussed the limitations of evaluation and management by telemedicine and the availability of in person appointments. The patient expressed understanding and agreed to proceed.     I discussed the assessment and treatment plan with the patient. The patient was provided an opportunity to ask questions and all were answered. The patient agreed with the plan and demonstrated an understanding of the instructions.   The patient was advised to call back or seek an in-person evaluation if the symptoms worsen or if the condition fails to improve as anticipated.  I provided 15 minutes of non-face-to-face time during this encounter.   Gabriel Hotter, MD    [facility] MD/PA/NP OP Progress Note  06/12/2022 2:59 PM Gabriel [name]  MRN:  [id number]  Chief Complaint:  Chief Complaint  Patient presents with   Follow-up   HPI:  This is a follow-up appointment for depression and anxiety.  He states that the gabapentin has been helpful for pain.  It has been less discomfort.  Although he feels a little drowsy during the day, he prefers it to having pain.  He denies any fall.  He has been trying to relax and sit in the porch.  He feels more relaxed when he is with his family.  He tends to feel anxious with racing thoughts of not being able to take care of his family. He gets lost in thinking about this.  He is trying to do breathing exercises.  He also agrees to try taking care of some house chores such as trashes.  He has middle insomnia without any reason.  He has good appetite.  He denies SI. He denies alcohol use or drug use.  He would like to stay on the current medication regimen at this  time.   Visit Diagnosis:    ICD-10-CM   1. MDD (major depressive disorder), recurrent episode, mild (HCC)  F33.0     2. Anxiety  F41.9     3. Insomnia, unspecified type  G47.00       Past Psychiatric History: Please see initial evaluation for full details. I have reviewed the history. No updates at this time.     Past Medical History:  Past Medical History:  Diagnosis Date   Anxiety    Carpal tunnel syndrome 2018   Depression    Depression    Phreesia 05/15/2020   Eczema    Elevated LFTs    Heart murmur    Insomnia    Seasonal allergies    Vitamin D deficiency     Past Surgical History:  Procedure Laterality Date   NO PAST SURGERIES      Family Psychiatric History: Please see initial evaluation for full details. I have reviewed the history. No updates at this time.     Family History:  Family History  Problem Relation Age of Onset   Allergic rhinitis Mother    Hypertension Mother    Diabetes Father    Hypertension Father    Hypertension Maternal Grandfather    Diabetes Maternal Grandfather    Cancer Paternal Grandmother        type unknown   Hypertension Paternal Grandmother    Cancer Paternal Grandfather  type unknown   Hypertension Paternal Grandfather     Social History:  Social History   Socioeconomic History   Marital status: Married    Spouse name: Not on file   Number of children: 2   Years of education: 12   Highest education level: High school graduate  Occupational History   Occupation: not employed  Tobacco Use   Smoking status: Never    Passive exposure: Never   Smokeless tobacco: Never  Vaping Use   Vaping Use: Never used  Substance and Sexual Activity   Alcohol use: No    Alcohol/week: 0.0 standard drinks of alcohol   Drug use: No   Sexual activity: Yes    Birth control/protection: None  Other Topics Concern   Not on file  Social History Narrative   He lives with wife and two children.   Unemployed at this present  time   Right handed   One story home   Highest level of education:  12th grade   Social Determinants of Health   Financial Resource Strain: Medium Risk (09/08/2017)   Overall Financial Resource Strain (CARDIA)    Difficulty of Paying Living Expenses: Somewhat hard  Food Insecurity: No Food Insecurity (09/08/2017)   Hunger Vital Sign    Worried About Running Out of Food in the Last Year: Never true    Ran Out of Food in the Last Year: Never true  Transportation Needs: No Transportation Needs (09/08/2017)   PRAPARE - Hydrologist (Medical): No    Lack of Transportation (Non-Medical): No  Physical Activity: Not on file  Stress: Stress Concern Present (09/08/2017)   [place]    Feeling of Stress : Very much  Social Connections: Not on file    Allergies: No Known Allergies  Metabolic Disorder Labs: Lab Results  Component Value Date   HGBA1C 6.2 (H) 04/02/2022   MPG 137 05/27/2019   MPG 137 12/29/2018   No results found for: "PROLACTIN" Lab Results  Component Value Date   CHOL 170 04/02/2022   TRIG 74 04/02/2022   HDL 43 04/02/2022   CHOLHDL 4.0 04/02/2022   VLDL 10 12/04/2016   LDLCALC 113 (H) 04/02/2022   LDLCALC 132 (H) 09/17/2021   Lab Results  Component Value Date   TSH 1.790 09/17/2021   TSH 2.450 07/20/2020    Therapeutic Level Labs: No results found for: "LITHIUM" No results found for: "VALPROATE" No results found for: "CBMZ"  Current Medications: Current Outpatient Medications  Medication Sig Dispense Refill   azelastine (ASTELIN) 0.1 % nasal spray Place 1 spray into both nostrils 2 (two) times daily. Use in each nostril as directed 30 mL 5   Brexpiprazole (REXULTI) 3 MG TABS Take 1 tablet (3 mg total) by mouth daily. 90 tablet 1   fluocinonide ointment (LIDEX) 0.05 % Apply twice daily to back for 2 weeks, then daily for 2 weeks, then every other day for 2 weeks  60 g 3   fluticasone (FLONASE) 50 MCG/ACT nasal spray Place 1 spray into both nostrils daily. 16 g 5   [START ON 06/16/2022] gabapentin (NEURONTIN) 100 MG capsule Take 1 capsule (100 mg total) by mouth 3 (three) times daily. 270 capsule 0   hydrocortisone 2.5 % cream APPLY TO FACE 2 TIMES DAILY FOR 3 WEEKS, THEN DAILY FOR 3 WEEKS, THEN EVERY OTHER DAY FOR 3 WEEKS THEN STOP. REPEAT AS NEEDED. 30 g 3   ketoconazole (  NIZORAL) 2 % shampoo Apply to face twice weekly and back daily x 1 wk, then twice weekly. 120 mL 11   levocetirizine (XYZAL) 5 MG tablet Take 1 tablet (5 mg total) by mouth every evening. 90 tablet 2   meclizine (ANTIVERT) 25 MG tablet Take 1 tablet (25 mg total) by mouth 3 (three) times daily as needed for dizziness. 30 tablet 1   mirtazapine (REMERON) 30 MG tablet Take 1 tablet (30 mg total) by mouth at bedtime. 90 tablet 0   topiramate (TOPAMAX) 100 MG tablet Take 1 tablet (100 mg total) by mouth 2 (two) times daily. 180 tablet 1   tretinoin (RETIN-A) 0.025 % cream Apply 1 application topically at bedtime.     venlafaxine XR (EFFEXOR-XR) 150 MG 24 hr capsule Take 1 capsule (150 mg total) by mouth daily. 90 capsule 1   venlafaxine XR (EFFEXOR-XR) 75 MG 24 hr capsule Take 1 capsule (75 mg total) by mouth daily. Total of 225 mg daily. Take along with 150 mg cap 90 capsule 1   No current facility-administered medications for this visit.     Musculoskeletal: Strength & Muscle Tone:  N/.A Gait & Station:  N/A Patient leans: N/A  Psychiatric Specialty Exam: Review of Systems  Psychiatric/Behavioral:  Positive for sleep disturbance. Negative for agitation, behavioral problems, confusion, decreased concentration, dysphoric mood, hallucinations, self-injury and suicidal ideas. The patient is nervous/anxious. The patient is not hyperactive.   All other systems reviewed and are negative.   There were no vitals taken for this visit.There is no height or weight on file to calculate BMI.   General Appearance: Fairly Groomed  Eye Contact:  Good  Speech:  Clear and Coherent  Volume:  Normal  Mood:   better  Affect:  Appropriate, Congruent, and calm  Thought Process:  Coherent  Orientation:  Full (Time, Place, and Person)  Thought Content: Logical   Suicidal Thoughts:  No  Homicidal Thoughts:  No  Memory:  Immediate;   Good  Judgement:  Good  Insight:  Good  Psychomotor Activity:  Normal  Concentration:  Concentration: Good and Attention Span: Good  Recall:  Good  Fund of Knowledge: Good  Language: Good  Akathisia:  No  Handed:  Right  AIMS (if indicated): not done  Assets:  Communication Skills Desire for Improvement  ADL's:  Intact  Cognition: WNL  Sleep:  Poor   Screenings: GAD-7    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[place] at [place] from 05/08/2022 in [place] at [place] from 04/24/2022 in [place] at [place] from 04/10/2022 in [place] at [place] from 02/27/2022 in [place] at [place]  Total GAD-7 Score 7 7 7 6 11       PHQ2-9    Show Low Office Visit from 04/03/2022 in [facility] Visit from 02/12/2022 in [place] from 01/30/2022 in [place] at [place] from 12/20/2021 in [place] at [place] from 11/30/2021 in [facility]  PHQ-2 Total Score 2 2 2 2 2   PHQ-9 Total Score 11 10 9 10 11       Flowsheet Row ED from 05/24/2022 in [facility] at [facility] from 12/20/2021 in [place] at [place] from 11/03/2020 in [place]  at [place] No Risk  No Risk No Risk        Assessment and Plan:  Gabriel [name] is a 47 y.o. year old male with a history of depression, bilateral carpel tunnel syndrome, who presents for follow up appointment for below.   1. MDD (major depressive disorder), recurrent episode, mild ([place]) 2. Anxiety Acute stressors include:  Other stressors include: unemployment, carpel tunnel syndrome, lower back pain, headache, loss of his maternal aunt    History:   He reports improvement in his mood/activity after being started on gabapentin for anxiety.  Although he may benefit from further adjustment in his medications such as trying duloxetine instead of venlafaxine, he prefers to stay on the current medication regimen at this time.  Will continue venlafaxine, mirtazapine, Abilify to target depression.  During our previous conversation, he expressed no interest in [place].  3. Insomnia, unspecified type - sleep study was not consistent with sleep apnea in 2023 Unchanged.  Will continue mirtazapine, gabapentin as described above.    Plan  Continue venlafaxine 225 mg (150 mg + 75 mg) daily Continue mirtazapine 30 mg at night  Continue Rexulti 3 mg at night - monitor drowsiness  Change gabapentin 100 mg three times a day - changed from 300 mg at night Next appointment: 3/20 at 3:30 , video [email or web address].   -  - on tramadol   - on topiramate for migraine   Past trials of medication: sertraline, fluoxetine, mirtazapine, nortriptyline for carpel tunnel syndrome, bupropion (drowsiness), quetiapine (drowsiness), Abilify ("off balance"), Trazodone, Ambien (limited benefit), lorazepam (worsening in anxiety, drowsiness) , temazepam, Lunesta          Collaboration of Care: Collaboration of Care: Other reviewed notes in Epic  Patient/Guardian was advised Release of Information must be obtained prior to any record release in order to collaborate their care with an outside provider. Patient/Guardian was advised if  they have not already done so to contact the registration department to sign all necessary forms in order for [place] to release information regarding their care.   Consent: Patient/Guardian gives verbal consent for treatment and assignment of benefits for services provided during this visit. Patient/Guardian expressed understanding and agreed to proceed.    [name], MD 06/12/2022, 2:59 PM

## 2022-06-12 ENCOUNTER — Other Ambulatory Visit (HOSPITAL_COMMUNITY): Payer: Self-pay

## 2022-06-12 ENCOUNTER — Encounter: Payer: Self-pay | Admitting: Psychiatry

## 2022-06-12 ENCOUNTER — Telehealth (INDEPENDENT_AMBULATORY_CARE_PROVIDER_SITE_OTHER): Payer: Commercial Managed Care - PPO | Admitting: Psychiatry

## 2022-06-12 DIAGNOSIS — F330 Major depressive disorder, recurrent, mild: Secondary | ICD-10-CM | POA: Diagnosis not present

## 2022-06-12 DIAGNOSIS — G4700 Insomnia, unspecified: Secondary | ICD-10-CM

## 2022-06-12 DIAGNOSIS — F419 Anxiety disorder, unspecified: Secondary | ICD-10-CM | POA: Diagnosis not present

## 2022-06-12 MED ORDER — GABAPENTIN 100 MG PO CAPS
100.0000 mg | ORAL_CAPSULE | Freq: Three times a day (TID) | ORAL | 0 refills | Status: DC
  Filled 2022-06-12: qty 270, 90d supply, fill #0

## 2022-06-12 NOTE — Patient Instructions (Signed)
Continue venlafaxine 225 mg (150 mg + 75 mg) daily Continue mirtazapine 30 mg at night  Continue Rexulti 3 mg at night  Change gabapentin 100 mg three times a day  Next appointment: 3/20 at 3:30

## 2022-06-19 ENCOUNTER — Telehealth (HOSPITAL_COMMUNITY): Payer: Self-pay | Admitting: Psychiatry

## 2022-06-19 ENCOUNTER — Ambulatory Visit (HOSPITAL_COMMUNITY): Payer: Commercial Managed Care - PPO | Admitting: Psychiatry

## 2022-06-19 NOTE — Telephone Encounter (Signed)
Therapist contacted patient via text through [place] for scheduled appointment.  However, patient reports not feeling well.  Therapist and patient agreed to cancel today's appointment and reschedule.

## 2022-07-28 NOTE — Progress Notes (Unsigned)
Virtual Visit via Video Note  I connected with Gabriel [name] on 07/31/22 at  3:30 PM EDT by a video enabled telemedicine application and verified that I am speaking with the correct person using two identifiers.  Location: Patient: home Provider: office Persons participated in the visit- patient, provider    I discussed the limitations of evaluation and management by telemedicine and the availability of in person appointments. The patient expressed understanding and agreed to proceed.    I discussed the assessment and treatment plan with the patient. The patient was provided an opportunity to ask questions and all were answered. The patient agreed with the plan and demonstrated an understanding of the instructions.   The patient was advised to call back or seek an in-person evaluation if the symptoms worsen or if the condition fails to improve as anticipated.  I provided 15 minutes of non-face-to-face time during this encounter.   [name], MD   [facility] MD/PA/NP OP Progress Note  07/31/2022 4:05 PM Gabriel [name]  MRN:  [id number]  Chief Complaint:  Chief Complaint  Patient presents with   Follow-up   HPI:  This is a follow-up appointment for depression and anxiety.  He states that he is doing good.  He has been able to go outside and sit down in the porch at least a few times per week.  He likes it, listening to bird, and it is refreshing.  His wife has been helpful in that aspect, and they have been able to do it together.  He is more engaged with his family, which he thinks is a huge step for him.  He finds gabapentin to be very helpful for his pain.  Although he feels drowsiness at times, he prefers to stay on the medication as it has been helpful.  He has middle insomnia, although he denies it relation to pain.  He experiences fluctuations in his appetite.  He has an upcoming appointment with PCP, although he is unsure of his weight.  He denies SI.  He denies panic  attacks.  He denies alcohol use or drug use.  He is willing to try switching to duloxetine at this time to see if it is helpful for his pain.    Visit Diagnosis:    ICD-10-CM   1. MDD (major depressive disorder), recurrent episode, mild ([place])  F33.0     2. Anxiety  F41.9     3. Insomnia, unspecified type  G47.00       Past Psychiatric History: Please see initial evaluation for full details. I have reviewed the history. No updates at this time.     Past Medical History:  Past Medical History:  Diagnosis Date   Anxiety    Carpal tunnel syndrome 2018   Depression    Depression    Phreesia 05/15/2020   Eczema    Elevated LFTs    Heart murmur    Insomnia    Seasonal allergies    Vitamin D deficiency     Past Surgical History:  Procedure Laterality Date   NO PAST SURGERIES      Family Psychiatric History: Please see initial evaluation for full details. I have reviewed the history. No updates at this time.     Family History:  Family History  Problem Relation Age of Onset   Allergic rhinitis Mother    Hypertension Mother    Diabetes Father    Hypertension Father    Hypertension Maternal Grandfather    Diabetes Maternal Grandfather  Cancer Paternal Grandmother        type unknown   Hypertension Paternal Grandmother    Cancer Paternal Grandfather        type unknown   Hypertension Paternal Grandfather     Social History:  Social History   Socioeconomic History   Marital status: Married    Spouse name: Not on file   Number of children: 2   Years of education: 12   Highest education level: High school graduate  Occupational History   Occupation: not employed  Tobacco Use   Smoking status: Never    Passive exposure: Never   Smokeless tobacco: Never  Vaping Use   Vaping Use: Never used  Substance and Sexual Activity   Alcohol use: No    Alcohol/week: 0.0 standard drinks of alcohol   Drug use: No   Sexual activity: Yes    Birth control/protection:  None  Other Topics Concern   Not on file  Social History Narrative   He lives with wife and two children.   Unemployed at this present time   Right handed   One story home   Highest level of education:  12th grade   Social Determinants of Health   Financial Resource Strain: Medium Risk (09/08/2017)   Overall Financial Resource Strain (CARDIA)    Difficulty of Paying Living Expenses: Somewhat hard  Food Insecurity: No Food Insecurity (09/08/2017)   Hunger Vital Sign    Worried About Running Out of Food in the Last Year: Never true    Ran Out of Food in the Last Year: Never true  Transportation Needs: No Transportation Needs (09/08/2017)   PRAPARE - Hydrologist (Medical): No    Lack of Transportation (Non-Medical): No  Physical Activity: Not on file  Stress: Stress Concern Present (09/08/2017)   [place]    Feeling of Stress : Very much  Social Connections: Not on file    Allergies: No Known Allergies  Metabolic Disorder Labs: Lab Results  Component Value Date   HGBA1C 6.2 (H) 04/02/2022   MPG 137 05/27/2019   MPG 137 12/29/2018   No results found for: "PROLACTIN" Lab Results  Component Value Date   CHOL 170 04/02/2022   TRIG 74 04/02/2022   HDL 43 04/02/2022   CHOLHDL 4.0 04/02/2022   VLDL 10 12/04/2016   LDLCALC 113 (H) 04/02/2022   LDLCALC 132 (H) 09/17/2021   Lab Results  Component Value Date   TSH 1.790 09/17/2021   TSH 2.450 07/20/2020    Therapeutic Level Labs: No results found for: "LITHIUM" No results found for: "VALPROATE" No results found for: "CBMZ"  Current Medications: Current Outpatient Medications  Medication Sig Dispense Refill   DULoxetine (CYMBALTA) 30 MG capsule Take 1 capsule (30 mg total) by mouth daily for 7 days. 7 capsule 0   [START ON 08/07/2022] DULoxetine (CYMBALTA) 60 MG capsule Take 1 capsule (60 mg total) by mouth daily. Start after  completion of 30 mg daily for one week 30 capsule 0   azelastine (ASTELIN) 0.1 % nasal spray Place 1 spray into both nostrils 2 (two) times daily. Use in each nostril as directed 30 mL 5   Brexpiprazole (REXULTI) 3 MG TABS Take 1 tablet (3 mg total) by mouth daily. 90 tablet 1   fluocinonide ointment (LIDEX) 0.05 % Apply twice daily to back for 2 weeks, then daily for 2 weeks, then every other day for 2 weeks  60 g 3   fluticasone (FLONASE) 50 MCG/ACT nasal spray Place 1 spray into both nostrils daily. 16 g 5   gabapentin (NEURONTIN) 100 MG capsule Take 1 capsule (100 mg total) by mouth 3 (three) times daily. 270 capsule 0   hydrocortisone 2.5 % cream APPLY TO FACE 2 TIMES DAILY FOR 3 WEEKS, THEN DAILY FOR 3 WEEKS, THEN EVERY OTHER DAY FOR 3 WEEKS THEN STOP. REPEAT AS NEEDED. 30 g 3   ketoconazole (NIZORAL) 2 % shampoo Apply to face twice weekly and back daily x 1 wk, then twice weekly. 120 mL 11   levocetirizine (XYZAL) 5 MG tablet Take 1 tablet (5 mg total) by mouth every evening. 90 tablet 2   meclizine (ANTIVERT) 25 MG tablet Take 1 tablet (25 mg total) by mouth 3 (three) times daily as needed for dizziness. 30 tablet 1   mirtazapine (REMERON) 30 MG tablet Take 1 tablet (30 mg total) by mouth at bedtime. 90 tablet 0   topiramate (TOPAMAX) 100 MG tablet Take 1 tablet (100 mg total) by mouth 2 (two) times daily. 180 tablet 1   tretinoin (RETIN-A) 0.025 % cream Apply 1 application topically at bedtime.     No current facility-administered medications for this visit.     Musculoskeletal: Strength & Muscle Tone:  N/A Gait & Station:  N/A Patient leans: N/A  Psychiatric Specialty Exam: Review of Systems  Psychiatric/Behavioral:  Positive for sleep disturbance. Negative for agitation, behavioral problems, confusion, decreased concentration, dysphoric mood, hallucinations, self-injury and suicidal ideas. The patient is nervous/anxious. The patient is not hyperactive.   All other systems reviewed  and are negative.   There were no vitals taken for this visit.There is no height or weight on file to calculate BMI.  General Appearance: Fairly Groomed  Eye Contact:  Good  Speech:  Clear and Coherent  Volume:  Normal  Mood:   good  Affect:  Appropriate, Congruent, and calm  Thought Process:  Coherent  Orientation:  Full (Time, Place, and Person)  Thought Content: Logical   Suicidal Thoughts:  No  Homicidal Thoughts:  No  Memory:  Immediate;   Good  Judgement:  Good  Insight:  Good  Psychomotor Activity:  Normal  Concentration:  Concentration: Good and Attention Span: Good  Recall:  Good  Fund of Knowledge: Good  Language: Good  Akathisia:  No  Handed:  Right  AIMS (if indicated): not done  Assets:  Communication Skills Desire for Improvement  ADL's:  Intact  Cognition: WNL  Sleep:  Poor   Screenings: GAD-7    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[place] at [place] from 05/08/2022 in [place] at [place] from 04/24/2022 in [place] at [place] from 04/10/2022 in [place] at [place] from 02/27/2022 in [place] at [place]  Total GAD-7 Score 7 7 7 6 11       PHQ2-9    [place] Office Visit from 04/03/2022 in [facility] Office Visit from 02/12/2022 in [place] from 01/30/2022 in [place] at [place] from 12/20/2021 in [place] at [place] from 11/30/2021 in [facility]  PHQ-2 Total Score 2 2 2 2 2   PHQ-9 Total Score 11 10 9 10 11       Flowsheet Row ED from 05/24/2022 in  [facility] from 12/20/2021 in [place] at  [place] from 11/03/2020 in [place] at [place] No Risk No Risk No Risk        Assessment and Plan:  Gabriel [name] is a 47 y.o. year old male with a history of depression, bilateral carpel tunnel syndrome, who presents for follow up appointment for below.   1. MDD (major depressive disorder), recurrent episode, mild ([place]) 2. Anxiety Acute stressors include:  Other stressors include: unemployment, carpel tunnel syndrome, lower back pain, headache, loss of his maternal aunt    History:  not interested in [place]  There has been steady improvement in mood and activity since being started on gabapentin.  He is willing to try switching from venlafaxine to duloxetine to optimize pain management, and to target depression and anxiety.  Discussed potential risk of discontinuation symptoms, and serotonin syndrome.  Will continue mirtazapine and Abilify to target depression.  Will continue gabapentin for anxiety and pain.  Noted that although he reports mild drowsiness from the medication, he strongly prefers to stay on the medication given it has been quite beneficial for pain.   3. Insomnia, unspecified type - sleep study was not consistent with sleep apnea in 2023  Unchanged.  Will continue mirtazapine and gabapentin as above.    Plan  Change the medication regimen as follows:  Week 1, decrease venlafaxine to 150 mg daily and initiate duloxetine at 30 mg daily Week 2,  decrease venlafaxine to 75 mg daily and increase duloxetine 60 mg daily  Week 3, discontinue venlafaxine, continue duloxetine 60 mg daily  Week 4- continue duloxetine 60 mg daily  Continue mirtazapine 30 mg at night  Continue Rexulti 3 mg at night - monitor drowsiness  Change gabapentin 100 mg three times a day - monitor drowsiness Next appointment: 4/17 at 10 am , video [email or web address].   - on tramadol   - on topiramate for migraine   Past trials of  medication: sertraline, fluoxetine, mirtazapine, nortriptyline for carpel tunnel syndrome, bupropion (drowsiness), quetiapine (drowsiness), Abilify ("off balance"), Trazodone, Ambien (limited benefit), lorazepam (worsening in anxiety, drowsiness) , temazepam, Lunesta        Collaboration of Care: Collaboration of Care: Other reviewed notes in Epic  Patient/Guardian was advised Release of Information must be obtained prior to any record release in order to collaborate their care with an outside provider. Patient/Guardian was advised if they have not already done so to contact the registration department to sign all necessary forms in order for [place] to release information regarding their care.   Consent: Patient/Guardian gives verbal consent for treatment and assignment of benefits for services provided during this visit. Patient/Guardian expressed understanding and agreed to proceed.    [name], MD 07/31/2022, 4:05 PM

## 2022-07-30 ENCOUNTER — Telehealth: Payer: Self-pay | Admitting: Podiatry

## 2022-07-30 ENCOUNTER — Encounter: Payer: Self-pay | Admitting: Podiatry

## 2022-07-30 NOTE — Progress Notes (Signed)
Refill request for compound medication from [facility] received. Patient has not been seen since  02/20/2021. He will need an appt prior to see if this is helping or if we need to change medications.

## 2022-07-30 NOTE — Telephone Encounter (Signed)
Left message for pt that we did get the medication refill request from [facility] and the he has not been seen since 10.2022 and he would need an appt to get this refilled. Dr Jacqualyn Posey wants to see if the medication is working or if a change in medication is needed.

## 2022-07-31 ENCOUNTER — Telehealth (INDEPENDENT_AMBULATORY_CARE_PROVIDER_SITE_OTHER): Payer: Commercial Managed Care - PPO | Admitting: Psychiatry

## 2022-07-31 ENCOUNTER — Encounter: Payer: Self-pay | Admitting: Psychiatry

## 2022-07-31 ENCOUNTER — Other Ambulatory Visit (HOSPITAL_COMMUNITY): Payer: Self-pay

## 2022-07-31 DIAGNOSIS — F330 Major depressive disorder, recurrent, mild: Secondary | ICD-10-CM

## 2022-07-31 DIAGNOSIS — F419 Anxiety disorder, unspecified: Secondary | ICD-10-CM

## 2022-07-31 DIAGNOSIS — G4700 Insomnia, unspecified: Secondary | ICD-10-CM | POA: Diagnosis not present

## 2022-07-31 MED ORDER — DULOXETINE HCL 60 MG PO CPEP
60.0000 mg | ORAL_CAPSULE | Freq: Every day | ORAL | 0 refills | Status: DC
  Filled 2022-07-31: qty 30, 30d supply, fill #0

## 2022-07-31 MED ORDER — DULOXETINE HCL 30 MG PO CPEP
30.0000 mg | ORAL_CAPSULE | Freq: Every day | ORAL | 0 refills | Status: DC
  Filled 2022-07-31: qty 7, 7d supply, fill #0

## 2022-07-31 NOTE — Patient Instructions (Signed)
Change the medication regimen as follows:  Week 1, decrease venlafaxine to 150 mg daily and initiate duloxetine at 30 mg daily Week 2,  decrease venlafaxine to 75 mg daily and increase duloxetine 60 mg daily  Week 3, discontinue venlafaxine, continue duloxetine 60 mg daily  Week 4- continue duloxetine 60 mg daily  Continue mirtazapine 30 mg at night  Continue Rexulti 3 mg at night - monitor drowsiness  Change gabapentin 100 mg three times a day  Next appointment: 4/17 at 10 am

## 2022-08-01 ENCOUNTER — Ambulatory Visit (INDEPENDENT_AMBULATORY_CARE_PROVIDER_SITE_OTHER): Payer: Commercial Managed Care - PPO | Admitting: Psychiatry

## 2022-08-01 DIAGNOSIS — F330 Major depressive disorder, recurrent, mild: Secondary | ICD-10-CM | POA: Diagnosis not present

## 2022-08-01 NOTE — Progress Notes (Signed)
Virtual Visit via Video Note  I connected with Gabriel [name] on 08/01/22 at 11:08 AM EDT by a video enabled telemedicine application and verified that I am speaking with the correct person using two identifiers.  Location: Patient: Home Provider: [place] office    I discussed the limitations of evaluation and management by telemedicine and the availability of in person appointments. The patient expressed understanding and agreed to proceed.  I provided 53 minutes of non-face-to-face time during this encounter.   [name], LCSW      THERAPIST PROGRESS NOTE    Session Time:  Thursday 08/01/2022 11:08 AM - 12:01 PM  Participation Level: Active  Treatment Goals addressed:   Improve ability to manage stress and anxiety AEB reducing episodes of anxiety (worry & nervousness) from 4 x per week to 1 x per week for 60 days per pt's self-report  Report a decrease in anxiety symptoms as evidenced by an overall reduction in anxiety score by a minimum of 25% on the Generalized Anxiety Disorder Scale for 2 months.     Progress in treatment:  progressing    Interventions: CBT and Supportive  Summary: Gabriel [name] is a 47 y.o. male who is referred for services by PCP Dr. Tula Nakayama due to patient experiencing symptoms of depression. He denies any psychiatric hospitalizations, He saw therapist Youlanda Roys in [place] for a few months. Patient reports symptoms began when he lost his job about a year ago. Per his report, he was fired after he complained about race discrimination. 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 He states now feeling as though he failed his family. He states not wanting to be around anyone and having difficulty engaging with his wife and children. He fears losing his family.  He reports ruminating thoughts about  job and the past, excessive worry depressed mood, tearfulness, anxiety, isolative behaviors, poor motivation, poor concentration, irritability, sleep difficulty, thoughts and feelings of hopelessness and worthlessness. He denies any suicidal ideations.  Patient last was seen via virtual visit about 2 months ago. He reports continued low level of intensity/frequency of symptoms of anxiety as reflected in the GAD-7.  He reports overall improved mood but still experiencing thoughts about his past work history along with feelings of guilt when he just sits around.  Patient reports he has been continuing to go outside more and sit on the porch weather permitting.  He also reports remaining engaged with family and eating better.  Patient reports recent incident when his wife asked him to participate in some type of an event involving interaction with other people.  Patient states just not wanting to be around anyone except his family.  He expresses frustration about this and verbalized thoughts of people thinking of him as a loser due to not having a job.  He verbalizes anger, regret, and guilt as well as shame and embarrassment.    SI/HI .Nowithout intent/plan  Therapist Response:  reviewed symptoms, administered GAD-7, discussed results, assisted patient identify connection between behavioral activation and mood, assisted patient identify ways to improve routine and daily structure to maintain consistent behavioral activation through the use of daily planning, assisted patient develop a schedule and sent to patient via email, developed plan with patient to follow daily schedule, facilitated patient expressing thoughts and feelings about recent incident regarding  interaction with other people, validated feelings, assisted patient identify connection between thoughts/mood/and behavior regarding this particular incident, assisted patient examine his thought pattern and the effects on his interactions, discussed next  steps for treatment to include increasing social interaction beyond family and to address anger regarding past.  Diagnosis: Axis I: MDD, single episode, severe    Axis II: No diagnosis  Collaboration of Care: Psychiatrist AEB patient working with psychiatrist Dr. Modesta Messing  Patient/Guardian was advised Release of Information must be obtained prior to any record release in order to collaborate their care with an outside provider. Patient/Guardian was advised if they have not already done so to contact the registration department to sign all necessary forms in order for [place] to release information regarding their care.   Consent: Patient/Guardian gives verbal consent for treatment and assignment of benefits for services provided during this visit. Patient/Guardian expressed understanding and agreed to proceed.   [name], LCSW 08/01/2022

## 2022-08-14 ENCOUNTER — Other Ambulatory Visit: Payer: Self-pay

## 2022-08-14 ENCOUNTER — Ambulatory Visit (INDEPENDENT_AMBULATORY_CARE_PROVIDER_SITE_OTHER): Payer: Commercial Managed Care - PPO | Admitting: Allergy & Immunology

## 2022-08-14 ENCOUNTER — Encounter: Payer: Self-pay | Admitting: Allergy & Immunology

## 2022-08-14 VITALS — BP 120/78 | HR 78 | Temp 98.2°F | Resp 20 | Ht 72.0 in | Wt 206.0 lb

## 2022-08-14 DIAGNOSIS — J3089 Other allergic rhinitis: Secondary | ICD-10-CM | POA: Diagnosis not present

## 2022-08-14 DIAGNOSIS — J302 Other seasonal allergic rhinitis: Secondary | ICD-10-CM

## 2022-08-14 DIAGNOSIS — K9049 Malabsorption due to intolerance, not elsewhere classified: Secondary | ICD-10-CM

## 2022-08-14 DIAGNOSIS — L2089 Other atopic dermatitis: Secondary | ICD-10-CM

## 2022-08-14 NOTE — Progress Notes (Signed)
FOLLOW UP  Date of Service/Encounter:  08/14/22   Assessment:   Seasonal and perennial allergic rhinitis (grasses, ragweed, weeds, trees, indoor molds, outdoor molds, dust mites, cat, and cockroach)   Flexural atopic dermatitis - followed by Dr. 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 Seasonal and perennial allergic rhinitis - Testing at the last visit showed: grasses, ragweed, weeds, trees, indoor molds, outdoor molds, dust mites, cat, and cockroach. - Continue with: Flonase (fluticasone) one spray per nostril TWICE daily (AIM FOR EAR ON EACH SIDE) - Continue taking: Xyzal (levocetirizine) 5mg  tablet once daily and Astelin (azelastine) 2 sprays per nostril TWICE daily as needed - You can use an extra dose of the antihistamine, if needed, for breakthrough symptoms.  - Consider nasal saline rinses 1-2 times daily to remove allergens from the nasal cavities as well as help with mucous clearance (this is especially helpful to do before the nasal sprays are given) - We will hold off on allergy shots at this time.   2. Flexural atopic dermatitis - Continue with all of the creams per Dr. Kemper Durie. - I will send my note to them.   3. Food intolerance - Testing was slightly reactive to soy and hazelnut. - There is no need to avoid these right now.   4. Return in about 6 months (around 02/13/2023).    Subjective:   Gabriel [name] is a 47 y.o. male presenting today for follow up of  Chief Complaint  Patient presents with   Follow-up    Pt states he has ben doing good with eczema.    Gabriel [name] has a history of the following: Patient Active Problem List   Diagnosis Date Noted   Vertigo 02/12/2022   Fatigue 09/02/2021   Intractable migraine without aura and with status migrainosus  07/16/2020   Migraine 07/11/2020   Infection of toenail 03/15/2020   Tinea versicolor 03/15/2020   Prediabetes 01/31/2020   Dyslipidemia, goal LDL below 100 06/14/2019   Right lumbar radiculopathy 05/31/2019   Elevated LFTs 05/31/2019   Current moderate episode of major depressive disorder without prior episode 05/12/2019   Discolored nails 11/01/2018   Anxiety 01/29/2018   Depression, major, single episode, severe 01/29/2018   Vitamin D deficiency 01/29/2018   Acne vulgaris 01/15/2018   Seborrheic dermatitis 01/15/2018   Traumatic injury to skin or subcutaneous tissue 01/15/2018   Bilateral carpal tunnel syndrome 01/14/2018   Insomnia 07/11/2017   Chronic hand pain 07/10/2017   Encounter for annual physical exam 02/21/2016   Overweight (BMI 25.0-29.9) 07/03/2013   IGT (impaired glucose tolerance) 10/31/2012   Perennial allergic rhinitis 10/19/2012   Intrinsic atopic dermatitis 10/19/2012    History obtained from: chart review and patient.  Gabriel [name] is a 47 y.o. male presenting for a follow up visit.  He was last seen in December 2023.  At that time, he had testing that was positive to grasses, ragweed, weeds, trees, indoor and outdoor molds, dust mite, cat, and cockroach.  We stopped the Claritin and continue with Flonase.  We did start him on Xyzal 5 mg and Astelin.  For his atopic dermatitis, we continued him on all of the creams that were prescribed by Dr. Ike Bene.  He was slightly reactive to soy and hazelnut on skin testing, although the clinical relevance was unclear.  Since last visit, he has done  very well.  He is joyful and smiling as always.  Allergic Rhinitis Symptom History: He is on Xyzal 5mg  twice daily. He is using the Astelin daily. He does not appreciate the taste. He has not had any antibiotics since the last visit at all.  Xyzal seems to have made the most difference.  He is good about using a nose spray every day.  He does not feel that allergy shots are  indicated at this time.  He feels quite good compared to when I last saw him.  Food Allergy Symptom History: He continues to eat slowly and he was alert.  He was not giving any clinical reactivity to them, so he was not too concerned.  He does not have an EpiPen.  Skin Symptom History: He sees Dr. Leonie Green once annually. He has an appt set up although he is not sure when in this.  He remains on the Retin-A applied at night.  He also has ketoconazole shampoo as well as hydrocortisone and fluocinonide ointment.  He sees Dr. Jacqualyn Posey due to an issue with his toe nail. He did not need foot surgery. He started what sounds like an antifungal.   He has mirtazapine which he uses nightly for sleeping. He has a history of neuropathy in his legs. He has carpal tunnel in both of his hands. He might have to have surgery but this not scheduled right now. He  needs an appointment to follow up on that. He has a history of elevated LFTs. They tend to fluctuate. He sees Dr. Moshe Cipro about that later this month about it. He is going to have a colonoscopy next year. He is on Rexulti, which is an antidepressant. He is also on venlafaxine.   They did not go anywhere for spring break.  His kids are active in multiple sports, so that ties them up even during school vacations.  Otherwise, there have been no changes to his past medical history, surgical history, family history, or social history.    Review of Systems  Constitutional: Negative.  Negative for chills, fever, malaise/fatigue and weight loss.  HENT:  Positive for congestion. Negative for ear discharge and ear pain.        Positive for postnasal drip.   Eyes:  Negative for pain, discharge and redness.  Respiratory:  Negative for cough, sputum production, shortness of breath and wheezing.   Cardiovascular: Negative.  Negative for chest pain and palpitations.  Gastrointestinal:  Negative for abdominal pain, heartburn, nausea and vomiting.  Skin:  Negative for  itching and rash.  Neurological:  Negative for dizziness and headaches.  Endo/Heme/Allergies:  Positive for environmental allergies. Does not bruise/bleed easily.       Objective:   Blood pressure 120/78, pulse 78, temperature 98.2 F (36.8 C), resp. rate 20, height 6' (1.829 m), weight 206 lb (93.4 kg), SpO2 95 %. Body mass index is 27.94 kg/m.    Physical Exam Vitals reviewed.  Constitutional:      Appearance: He is well-developed.  HENT:     Head: Normocephalic and atraumatic.     Right Ear: Tympanic membrane, ear canal and external ear normal. No drainage, swelling or tenderness. Tympanic membrane is not injected, scarred, erythematous, retracted or bulging.     Left Ear: Tympanic membrane, ear canal and external ear normal. No drainage, swelling or tenderness. Tympanic membrane is not injected, scarred, erythematous, retracted or bulging.     Nose: No nasal deformity, septal deviation, mucosal edema or rhinorrhea.  Right Turbinates: Enlarged. Not swollen or pale.     Left Turbinates: Enlarged. Not swollen or pale.     Right Sinus: No maxillary sinus tenderness or frontal sinus tenderness.     Left Sinus: No maxillary sinus tenderness or frontal sinus tenderness.     Comments: No nasal polyps noted.     Mouth/Throat:     Mouth: Mucous membranes are not pale and not dry.     Pharynx: Uvula midline.  Eyes:     General: Allergic shiner present.        Right eye: No discharge.        Left eye: No discharge.     Conjunctiva/sclera: Conjunctivae normal.     Right eye: Right conjunctiva is not injected. No chemosis.    Left eye: Left conjunctiva is not injected. No chemosis.    Pupils: Pupils are equal, round, and reactive to light.  Cardiovascular:     Rate and Rhythm: Normal rate and regular rhythm.     Heart sounds: Normal heart sounds.  Pulmonary:     Effort: Pulmonary effort is normal. No tachypnea, accessory muscle usage or respiratory distress.     Breath sounds:  Normal breath sounds. No wheezing, rhonchi or rales.  Chest:     Chest wall: No tenderness.  Lymphadenopathy:     Head:     Right side of head: No submandibular, tonsillar or occipital adenopathy.     Left side of head: No submandibular, tonsillar or occipital adenopathy.     Cervical: No cervical adenopathy.  Skin:    General: Skin is warm.     Capillary Refill: Capillary refill takes less than 2 seconds.     Coloration: Skin is not pale.     Findings: No abrasion, erythema, petechiae or rash. Rash is not papular, urticarial or vesicular.     Comments: He does have several hyperpigmented lesions over his entire body, especially on his back. He has some scarring on his face secondary to either eczema or acne.   Neurological:     Mental Status: He is alert.      Diagnostic studies: none     Salvatore Marvel, MD  [facility]

## 2022-08-14 NOTE — Patient Instructions (Addendum)
1. Seasonal and perennial allergic rhinitis - Testing at the last visit showed: grasses, ragweed, weeds, trees, indoor molds, outdoor molds, dust mites, cat, and cockroach. - Continue with: Flonase (fluticasone) one spray per nostril TWICE daily (AIM FOR EAR ON EACH SIDE) - Continue taking: Xyzal (levocetirizine) 5mg  tablet once daily and Astelin (azelastine) 2 sprays per nostril TWICE daily as needed - You can use an extra dose of the antihistamine, if needed, for breakthrough symptoms.  - Consider nasal saline rinses 1-2 times daily to remove allergens from the nasal cavities as well as help with mucous clearance (this is especially helpful to do before the nasal sprays are given) - We will hold off on allergy shots at this time.   2. Flexural atopic dermatitis - Continue with all of the creams per Dr. Kemper Durie. - I will send my note to them.   3. Food intolerance - Testing was slightly reactive to soy and hazelnut. - There is no need to avoid these right now.   4. Return in about 6 months (around 02/13/2023).    Please inform us of any Emergency Department visits, hospitalizations, or changes in symptoms. Call us before going to the ED for breathing or allergy symptoms since we might be able to fit you in for a sick visit. Feel free to contact us anytime with any questions, problems, or concerns.  It was a pleasure to see you again today!  Websites that have reliable patient information: 1. American Academy of Asthma, Allergy, and Immunology: www.aaaai.org 2. Food Allergy Research and Education (FARE): foodallergy.org 3. Mothers of Asthmatics: http://www.asthmacommunitynetwork.org 4. American College of Allergy, Asthma, and Immunology: www.acaai.org   COVID-19 Vaccine Information can be found at: ShippingScam.co.uk For questions related to vaccine distribution or appointments, please email [email or web address] or call  [phone].   We realize that you might be concerned about having an allergic reaction to the COVID19 vaccines. To help with that concern, WE ARE OFFERING THE COVID19 VACCINES IN OUR OFFICE! Ask the front desk for dates!     "Like" [place] on Facebook and Instagram for our latest updates!      A healthy democracy works best when [employer] participate! Make sure you are registered to vote! If you have moved or changed any of your contact information, you will need to get this updated before voting!  In some cases, you MAY be able to register to vote online: [email or web address]

## 2022-08-15 ENCOUNTER — Ambulatory Visit (HOSPITAL_COMMUNITY): Payer: Commercial Managed Care - PPO | Admitting: Psychiatry

## 2022-08-24 NOTE — Progress Notes (Unsigned)
Virtual Visit via Video Note  I connected with Gabriel [name] on 08/28/22 at 10:00 AM EDT by a video enabled telemedicine application and verified that I am speaking with the correct person using two identifiers.  Location: Patient: home Provider: office Persons participated in the visit- patient, provider    I discussed the limitations of evaluation and management by telemedicine and the availability of in person appointments. The patient expressed understanding and agreed to proceed.    I discussed the assessment and treatment plan with the patient. The patient was provided an opportunity to ask questions and all were answered. The patient agreed with the plan and demonstrated an understanding of the instructions.   The patient was advised to call back or seek an in-person evaluation if the symptoms worsen or if the condition fails to improve as anticipated.  I provided 10 minutes of non-face-to-face time during this encounter.   [name], MD     [facility] MD/PA/NP OP Progress Note  08/28/2022 10:26 AM Gabriel [name]  MRN:  [id number]  Chief Complaint:  Chief Complaint  Patient presents with   Follow-up   HPI:  This is a follow-up appointment for depression, anxiety and insomnia.  He reports experiencing a migraine headache at the time of this visit.  It happens a few times a week.  He denies any worsening since starting duloxetine.  He continues to experience leg pain.  He is just trying to deal with it.  He has not been able to go outside as much due to pollen allergy.  He still tries to have time with his family when they eat.  He thinks his depression has been manageable.  He has fair sleep.  He denies SI.  He denies any side effects since starting duloxetine.  He feels comfortable to stay on the medication as it is at this time.     Wt Readings from Last 3 Encounters:  08/14/22 206 lb (93.4 kg)  05/10/22 204 lb 3.2 oz (92.6 kg)  04/03/22 202 lb 0.6 oz (91.6 kg)      Visit Diagnosis:    ICD-10-CM   1. MDD (major depressive disorder), recurrent episode, mild  F33.0     2. Anxiety  F41.9     3. Insomnia, unspecified type  G47.00       Past Psychiatric History: Please see initial evaluation for full details. I have reviewed the history. No updates at this time.     Past Medical History:  Past Medical History:  Diagnosis Date   Anxiety    Carpal tunnel syndrome 2018   Depression    Depression    Phreesia 05/15/2020   Eczema    Elevated LFTs    Heart murmur    Insomnia    Seasonal allergies    Vitamin D deficiency     Past Surgical History:  Procedure Laterality Date   NO PAST SURGERIES      Family Psychiatric History: Please see initial evaluation for full details. I have reviewed the history. No updates at this time.     Family History:  Family History  Problem Relation Age of Onset   Allergic rhinitis Mother    Hypertension Mother    Diabetes Father    Hypertension Father    Hypertension Maternal Grandfather    Diabetes Maternal Grandfather    Cancer Paternal Grandmother        type unknown   Hypertension Paternal Grandmother    Cancer Paternal Grandfather  type unknown   Hypertension Paternal Grandfather     Social History:  Social History   Socioeconomic History   Marital status: Married    Spouse name: Not on file   Number of children: 2   Years of education: 12   Highest education level: High school graduate  Occupational History   Occupation: not employed  Tobacco Use   Smoking status: Never    Passive exposure: Never   Smokeless tobacco: Never  Vaping Use   Vaping Use: Never used  Substance and Sexual Activity   Alcohol use: No    Alcohol/week: 0.0 standard drinks of alcohol   Drug use: No   Sexual activity: Yes    Birth control/protection: None  Other Topics Concern   Not on file  Social History Narrative   He lives with wife and two children.   Unemployed at this present time   Right  handed   One story home   Highest level of education:  12th grade   Social Determinants of Health   Financial Resource Strain: Medium Risk (09/08/2017)   Overall Financial Resource Strain (CARDIA)    Difficulty of Paying Living Expenses: Somewhat hard  Food Insecurity: No Food Insecurity (09/08/2017)   Hunger Vital Sign    Worried About Running Out of Food in the Last Year: Never true    Ran Out of Food in the Last Year: Never true  Transportation Needs: No Transportation Needs (09/08/2017)   PRAPARE - Administrator, Civil Service (Medical): No    Lack of Transportation (Non-Medical): No  Physical Activity: Not on file  Stress: Stress Concern Present (09/08/2017)   [employer] of Occupational Health - Occupational Stress Questionnaire    Feeling of Stress : Very much  Social Connections: Not on file    Allergies: No Known Allergies  Metabolic Disorder Labs: Lab Results  Component Value Date   HGBA1C 6.2 (H) 04/02/2022   MPG 137 05/27/2019   MPG 137 12/29/2018   No results found for: "PROLACTIN" Lab Results  Component Value Date   CHOL 170 04/02/2022   TRIG 74 04/02/2022   HDL 43 04/02/2022   CHOLHDL 4.0 04/02/2022   VLDL 10 12/04/2016   LDLCALC 113 (H) 04/02/2022   LDLCALC 132 (H) 09/17/2021   Lab Results  Component Value Date   TSH 1.790 09/17/2021   TSH 2.450 07/20/2020    Therapeutic Level Labs: No results found for: "LITHIUM" No results found for: "VALPROATE" No results found for: "CBMZ"  Current Medications: Current Outpatient Medications  Medication Sig Dispense Refill   azelastine (ASTELIN) 0.1 % nasal spray Place 1 spray into both nostrils 2 (two) times daily. Use in each nostril as directed 30 mL 5   Brexpiprazole (REXULTI) 3 MG TABS Take 1 tablet (3 mg total) by mouth daily. 90 tablet 1   DULoxetine (CYMBALTA) 30 MG capsule Take 1 capsule (30 mg total) by mouth daily for 7 days. 7 capsule 0   DULoxetine (CYMBALTA) 60 MG capsule Take  1 capsule (60 mg total) by mouth daily. Start after completion of 30 mg daily for one week 30 capsule 0   fluocinonide ointment (LIDEX) 0.05 % Apply twice daily to back for 2 weeks, then daily for 2 weeks, then every other day for 2 weeks 60 g 3   fluticasone (FLONASE) 50 MCG/ACT nasal spray Place 1 spray into both nostrils daily. 16 g 5   gabapentin (NEURONTIN) 100 MG capsule Take 1 capsule (100 mg  total) by mouth 3 (three) times daily. 270 capsule 0   gabapentin (NEURONTIN) 300 MG capsule      hydrocortisone 2.5 % cream APPLY TO FACE 2 TIMES DAILY FOR 3 WEEKS, THEN DAILY FOR 3 WEEKS, THEN EVERY OTHER DAY FOR 3 WEEKS THEN STOP. REPEAT AS NEEDED. 30 g 3   ketoconazole (NIZORAL) 2 % shampoo Apply to face twice weekly and back daily x 1 wk, then twice weekly. 120 mL 11   levocetirizine (XYZAL) 5 MG tablet Take 1 tablet (5 mg total) by mouth every evening. 90 tablet 2   meclizine (ANTIVERT) 25 MG tablet Take 1 tablet (25 mg total) by mouth 3 (three) times daily as needed for dizziness. 30 tablet 1   mirtazapine (REMERON) 30 MG tablet Take 1 tablet (30 mg total) by mouth at bedtime. 90 tablet 0   topiramate (TOPAMAX) 100 MG tablet Take 1 tablet (100 mg total) by mouth 2 (two) times daily. 180 tablet 1   tretinoin (RETIN-A) 0.025 % cream Apply 1 application topically at bedtime.     No current facility-administered medications for this visit.     Musculoskeletal: Strength & Muscle Tone:  N/A Gait & Station:  N/A Patient leans: N/A  Psychiatric Specialty Exam: Review of Systems  Psychiatric/Behavioral:  Positive for dysphoric mood and sleep disturbance. Negative for agitation, behavioral problems, confusion, decreased concentration, hallucinations, self-injury and suicidal ideas. The patient is nervous/anxious. The patient is not hyperactive.   All other systems reviewed and are negative.   There were no vitals taken for this visit.There is no height or weight on file to calculate BMI.  General  Appearance: Fairly Groomed  Eye Contact:  Good  Speech:  Clear and Coherent  Volume:  Normal  Mood:   fine  Affect:  Appropriate, Congruent, and grimace , fatigue  Thought Process:  Coherent  Orientation:  Full (Time, Place, and Person)  Thought Content: Logical   Suicidal Thoughts:  No  Homicidal Thoughts:  No  Memory:  Immediate;   Good  Judgement:  Good  Insight:  Good  Psychomotor Activity:  Normal  Concentration:  Concentration: Good and Attention Span: Good  Recall:  Good  Fund of Knowledge: Good  Language: Good  Akathisia:  No  Handed:  Right  AIMS (if indicated): not done  Assets:  Communication Skills Desire for Improvement  ADL's:  Intact  Cognition: WNL  Sleep:  Fair   Screenings: GAD-7    Advertising copywriter from 08/01/2022 in [facility] [place] Counselor from 06/05/2022 in [facility] [place] Counselor from 05/08/2022 in [facility] at [place] Counselor from 04/24/2022 in [facility] at [place] Counselor from 04/10/2022 in [facility] at [place]  Total GAD-7 Score PHQ2-9    Flowsheet Row Office Visit from 04/03/2022 in [facility] Office Visit from 02/12/2022 in [facility] Counselor from 01/30/2022 in [facility] [place] Counselor from 12/20/2021 in [facility] at [place] Office Visit from 11/30/2021 in [facility] [place] [facility]  PHQ-2 Total Score PHQ-9 Total Score Flowsheet Row ED from 05/24/2022 in [facility] from 12/20/2021 in [facility] at [place] Counselor from 11/03/2020  in [facility] at [place]  C-SSRS RISK CATEGORY No Risk  No Risk No Risk        Assessment and Plan:  Gabriel [name] is a 47 y.o. year old male with a history of depression, bilateral carpel tunnel syndrome, who presents for follow up appointment for below.   1. MDD (major depressive disorder), recurrent episode, mild 2. Anxiety Acute stressors include:  Other stressors include: unemployment, carpel tunnel syndrome, lower back pain, headache, loss of his maternal aunt    History:  not interested in TMS  Although exam is limited due to him experiencing migraine at the time of the visit, he has been tolerating well with cross tapering from venlafaxine to duloxetine.  Will continue current dose of duloxetine to see if it exerts its benefit for depression, anxiety, neuropathic pain.  Will continue mirtazapine and Abilify as adjunctive treatment for depression.  Will continue gabapentin for anxiety and pain. Noted that although he reports mild drowsiness from the medication, he strongly prefers to continue with it, considering its significant benefits for pain relief.  3. Insomnia, unspecified type - sleep study was not consistent with sleep apnea in 2023   Unchanged.  Will continue mirtazapine and gabapentin for insomnia.   Plan  Continue duloxetine 60 mg daily  Continue mirtazapine 30 mg at night  Continue Rexulti 3 mg at night - monitor drowsiness  Change gabapentin 100 mg three times a day - monitor drowsiness Next appointment: 6/5 at 11 am , video [email or web address].    - on tramadol   - on topiramate for migraine   Past trials of medication: sertraline, fluoxetine, mirtazapine, nortriptyline for carpel tunnel syndrome, bupropion (drowsiness), quetiapine (drowsiness), Abilify ("off balance"), Trazodone, Ambien (limited benefit), lorazepam (worsening in anxiety, drowsiness) , temazepam, Lunesta      Collaboration of Care: Collaboration of Care: Other reviewed notes in Epic  Patient/Guardian was advised Release of Information must  be obtained prior to any record release in order to collaborate their care with an outside provider. Patient/Guardian was advised if they have not already done so to contact the registration department to sign all necessary forms in order for [place] to release information regarding their care.   Consent: Patient/Guardian gives verbal consent for treatment and assignment of benefits for services provided during this visit. Patient/Guardian expressed understanding and agreed to proceed.    [name], MD 08/28/2022, 10:26 AM

## 2022-08-28 ENCOUNTER — Telehealth (INDEPENDENT_AMBULATORY_CARE_PROVIDER_SITE_OTHER): Payer: Commercial Managed Care - PPO | Admitting: Psychiatry

## 2022-08-28 ENCOUNTER — Other Ambulatory Visit (HOSPITAL_COMMUNITY): Payer: Self-pay

## 2022-08-28 ENCOUNTER — Encounter: Payer: Self-pay | Admitting: Psychiatry

## 2022-08-28 DIAGNOSIS — F419 Anxiety disorder, unspecified: Secondary | ICD-10-CM | POA: Diagnosis not present

## 2022-08-28 DIAGNOSIS — G4700 Insomnia, unspecified: Secondary | ICD-10-CM | POA: Diagnosis not present

## 2022-08-28 DIAGNOSIS — F330 Major depressive disorder, recurrent, mild: Secondary | ICD-10-CM | POA: Diagnosis not present

## 2022-08-28 MED ORDER — DULOXETINE HCL 60 MG PO CPEP
60.0000 mg | ORAL_CAPSULE | Freq: Every day | ORAL | 1 refills | Status: DC
  Filled 2022-08-28 – 2022-11-26 (×2): qty 30, 30d supply, fill #0

## 2022-08-28 MED ORDER — MIRTAZAPINE 30 MG PO TABS
30.0000 mg | ORAL_TABLET | Freq: Every day | ORAL | 1 refills | Status: DC
  Filled 2022-08-28 – 2022-11-26 (×2): qty 90, 90d supply, fill #0
  Filled 2023-02-26: qty 90, 90d supply, fill #1

## 2022-08-28 NOTE — Patient Instructions (Signed)
Continue duloxetine 60 mg daily  Continue mirtazapine 30 mg at night  Continue Rexulti 3 mg at night   Change gabapentin 100 mg three times a day  Next appointment: 6/5 at 11 am

## 2022-08-29 ENCOUNTER — Ambulatory Visit (INDEPENDENT_AMBULATORY_CARE_PROVIDER_SITE_OTHER): Payer: Commercial Managed Care - PPO | Admitting: Psychiatry

## 2022-08-29 DIAGNOSIS — F330 Major depressive disorder, recurrent, mild: Secondary | ICD-10-CM | POA: Diagnosis not present

## 2022-08-29 NOTE — Progress Notes (Signed)
Virtual Visit via Video Note  I connected with Gabriel [name] on 08/29/22 at 11:16 AM EDT  by a video enabled telemedicine application and verified that I am speaking with the correct person using two identifiers.  Location: Patient: Home Provider: [facility] [place] office    I discussed the limitations of evaluation and management by telemedicine and the availability of in person appointments. The patient expressed understanding and agreed to proceed.   I provided 40 minutes of non-face-to-face time during this encounter.   [name], LCSW      THERAPIST PROGRESS NOTE    Session Time:  Thursday 08/29/2022 11:16 AM - 11:56 AM   Participation Level: Active  Treatment Goals addressed:   Improve ability to manage stress and anxiety AEB reducing episodes of anxiety (worry & nervousness) from 4 x per week to 1 x per week for 60 days per pt's self-report  Report a decrease in anxiety symptoms as evidenced by an overall reduction in anxiety score by a minimum of 25% on the Generalized Anxiety Disorder Scale for 2 months.     Progress in treatment:  progressing    Interventions: CBT and Supportive  Summary: Gabriel [name] is a 47 y.o. male who is referred for services by PCP Dr. Syliva Overman due to patient experiencing symptoms of depression. He denies any psychiatric hospitalizations, He saw therapist Dorann Lodge in [place] for a few months. Patient reports symptoms began when he lost his job about a year ago. Per his report, he was fired after he complained about race discrimination. 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 He states now feeling as though he failed his family. He states not wanting to be around anyone and having difficulty engaging with his wife and children. He fears losing his family.  He reports ruminating thoughts  about job and the past, excessive worry depressed mood, tearfulness, anxiety, isolative behaviors, poor motivation, poor concentration, irritability, sleep difficulty, thoughts and feelings of hopelessness and worthlessness. He denies any suicidal ideations.  Patient last was seen via virtual visit about 2-3 weeks ago. He reports not feeling well since last session due to pain and increased frequency of migraines since last session.  He states trying to follow schedule developed in last session but says he has not being going outside as much due to seasonal allergies.  Patient reports he really has not pursued alternative activities discussed in last session as he forgot.  He also reports difficulty implementing plan due to poor motivation and verbalizes "thoughts of not feeling like it".  He reports continued interaction with his children and his wife.  He reports ambivalent feelings about possibly increasing social interaction beyond family stating sometimes he wants to do this and sometimes he does not.  SI/HI .Nowithout intent/plan  Therapist Response:  reviewed symptoms, assisted patient identify/address thoughts and processes causing difficulty implementing schedule, developed plan with patient to asked wife to [place] 2 copies of schedule so patient came post schedules to remind him of activities to pursue, assisted patient identify benefits of implementing schedule an consequences of not implementing schedule, used cognitive diffusion to help patient relate differently to thoughts inhibiting implementation of schedule, began to assist patient identify ways to increase social interaction beyond family, developed plan with patient to repair a list of ways he would like to increase  social interaction in preparation for next session. Diagnosis: Axis I: MDD, single episode, severe    Axis II: No diagnosis  Collaboration of Care: Psychiatrist AEB patient working with psychiatrist Dr.  Vanetta Shawl  Patient/Guardian was advised Release of Information must be obtained prior to any record release in order to collaborate their care with an outside provider. Patient/Guardian was advised if they have not already done so to contact the registration department to sign all necessary forms in order for [place] to release information regarding their care.   Consent: Patient/Guardian gives verbal consent for treatment and assignment of benefits for services provided during this visit. Patient/Guardian expressed understanding and agreed to proceed.   [name], LCSW 08/29/2022

## 2022-09-03 DIAGNOSIS — E785 Hyperlipidemia, unspecified: Secondary | ICD-10-CM | POA: Diagnosis not present

## 2022-09-03 DIAGNOSIS — E559 Vitamin D deficiency, unspecified: Secondary | ICD-10-CM | POA: Diagnosis not present

## 2022-09-03 DIAGNOSIS — R7303 Prediabetes: Secondary | ICD-10-CM | POA: Diagnosis not present

## 2022-09-04 ENCOUNTER — Other Ambulatory Visit: Payer: Self-pay

## 2022-09-04 ENCOUNTER — Ambulatory Visit: Payer: Commercial Managed Care - PPO | Admitting: Family Medicine

## 2022-09-04 ENCOUNTER — Other Ambulatory Visit (HOSPITAL_COMMUNITY): Payer: Self-pay

## 2022-09-04 ENCOUNTER — Encounter: Payer: Self-pay | Admitting: Family Medicine

## 2022-09-04 VITALS — BP 124/78 | HR 59 | Resp 16 | Ht 72.0 in | Wt 206.1 lb

## 2022-09-04 DIAGNOSIS — E663 Overweight: Secondary | ICD-10-CM

## 2022-09-04 DIAGNOSIS — Z23 Encounter for immunization: Secondary | ICD-10-CM

## 2022-09-04 DIAGNOSIS — J3089 Other allergic rhinitis: Secondary | ICD-10-CM | POA: Diagnosis not present

## 2022-09-04 DIAGNOSIS — E785 Hyperlipidemia, unspecified: Secondary | ICD-10-CM

## 2022-09-04 DIAGNOSIS — Z1211 Encounter for screening for malignant neoplasm of colon: Secondary | ICD-10-CM

## 2022-09-04 DIAGNOSIS — G43011 Migraine without aura, intractable, with status migrainosus: Secondary | ICD-10-CM | POA: Diagnosis not present

## 2022-09-04 DIAGNOSIS — F322 Major depressive disorder, single episode, severe without psychotic features: Secondary | ICD-10-CM

## 2022-09-04 DIAGNOSIS — R7302 Impaired glucose tolerance (oral): Secondary | ICD-10-CM

## 2022-09-04 LAB — LIPID PANEL
Chol/HDL Ratio: 3.4 ratio (ref 0.0–5.0)
Cholesterol, Total: 155 mg/dL (ref 100–199)
HDL: 45 mg/dL (ref >39)
LDL Chol Calc (NIH): 97 mg/dL (ref 0–99)
Triglycerides: 64 mg/dL (ref 0–149)
VLDL Cholesterol Cal: 13 mg/dL (ref 5–40)

## 2022-09-04 LAB — CMP14+EGFR
ALT: 35 IU/L (ref 0–44)
AST: 26 IU/L (ref 0–40)
Albumin/Globulin Ratio: 1.8 (ref 1.2–2.2)
Albumin: 4.4 g/dL (ref 4.1–5.1)
Alkaline Phosphatase: 106 IU/L (ref 44–121)
BUN/Creatinine Ratio: 12 (ref 9–20)
BUN: 14 mg/dL (ref 6–24)
Bilirubin Total: 0.4 mg/dL (ref 0.0–1.2)
CO2: 24 mmol/L (ref 20–29)
Calcium: 9.2 mg/dL (ref 8.7–10.2)
Chloride: 104 mmol/L (ref 96–106)
Creatinine, Ser: 1.14 mg/dL (ref 0.76–1.27)
Globulin, Total: 2.5 g/dL (ref 1.5–4.5)
Glucose: 111 mg/dL — ABNORMAL HIGH (ref 70–99)
Potassium: 4.4 mmol/L (ref 3.5–5.2)
Sodium: 142 mmol/L (ref 134–144)
Total Protein: 6.9 g/dL (ref 6.0–8.5)
eGFR: 80 mL/min/{1.73_m2} (ref >59)

## 2022-09-04 LAB — HEMOGLOBIN A1C
Est. average glucose Bld gHb Est-mCnc: 134 mg/dL
Hgb A1c MFr Bld: 6.3 % — ABNORMAL HIGH (ref 4.8–5.6)

## 2022-09-04 LAB — VITAMIN D 25 HYDROXY (VIT D DEFICIENCY, FRACTURES): Vit D, 25-Hydroxy: 21.6 ng/mL — ABNORMAL LOW (ref 30.0–100.0)

## 2022-09-04 LAB — TSH: TSH: 1.44 u[IU]/mL (ref 0.450–4.500)

## 2022-09-04 MED ORDER — VITAMIN D (ERGOCALCIFEROL) 1.25 MG (50000 UNIT) PO CAPS
50000.0000 [IU] | ORAL_CAPSULE | ORAL | 1 refills | Status: DC
  Filled 2022-09-04: qty 12, 84d supply, fill #0
  Filled 2022-11-26: qty 12, 84d supply, fill #1

## 2022-09-04 NOTE — Assessment & Plan Note (Signed)
Improved since last visit, continue both lifestyle changes, therapy and med management

## 2022-09-04 NOTE — Assessment & Plan Note (Signed)
Improved and controlled on current regime

## 2022-09-04 NOTE — Patient Instructions (Addendum)
Annual physical exam early October,, call if you need me sooner  TdAP today  You are referred for colonoscopy in [place] per your request, that office will call you with appt info  You are improving  Trade white foods like pasta and bread and rice for colored foods , vegetables and fruit   Pls commit to 64 ounces water daily  Driveway walking excellent , increase to 5 to 7 days as able  Vit D is low, medication new at once weekly is sent in, also chamnging food choice and walking will help this  GREAT you ARE improved, keep keeping on!  Thanks for choosing [facility], we consider it a privelige to serve you.

## 2022-09-04 NOTE — Assessment & Plan Note (Signed)
Hyperlipidemia:Low fat diet discussed and encouraged.   Lipid Panel  Lab Results  Component Value Date   CHOL 155 09/03/2022   HDL 45 09/03/2022   LDLCALC 97 09/03/2022   TRIG 64 09/03/2022   CHOLHDL 3.4 09/03/2022     Controlled, no change in medication

## 2022-09-04 NOTE — Progress Notes (Signed)
Gabriel [name]     MRN: [id number]      DOB: 03/03/1976   HPI Gabriel [name] is here for follow up and re-evaluation of chronic medical conditions, medication management and review of any available recent lab and radiology data.  Preventive health is updated, specifically  Cancer screening and Immunization.   Questions or concerns regarding consultations or procedures which the PT has had in the interim are  addressed. The PT denies any adverse reactions to current medications since the last visit.  There are no new concerns.  There are no specific complaints   ROS Denies recent fever or chills. Denies sinus pressure, nasal congestion, ear pain or sore throat. Denies chest congestion, productive cough or wheezing. Denies chest pains, palpitations and leg swelling Denies abdominal pain, nausea, vomiting,diarrhea or constipation.   Denies dysuria, frequency, hesitancy or incontinence. Denies joint pain, swelling and limitation in mobility. Denies headaches, seizures, numbness, or tingling. C/o depression, anxiety and insomnia.States improving Denies skin break down or rash.   PE  BP 124/78   Pulse (!) 59   Resp 16   Ht 6' (1.829 m)   Wt 206 lb 1.9 oz (93.5 kg)   SpO2 98%   BMI 27.95 kg/m   Patient alert and oriented and in no cardiopulmonary distress.  HEENT: No facial asymmetry, EOMI,     Neck supple .  Chest: Clear to auscultation bilaterally.  CVS: S1, S2 no murmurs, no S3.Regular rate.  ABD: Soft non tender.   Ext: No edema  MS: Adequate ROM spine, shoulders, hips and knees.  Skin: Intact, no ulcerations or rash noted.  Psych: Good eye contact, normal affect. Memory intact not anxious or depressed appearing.  CNS: CN 2-12 intact, power,  normal throughout.no focal deficits noted.   Assessment & Plan  IGT (impaired glucose tolerance) Patient educated about the importance of limiting  Carbohydrate intake , the need to commit to daily physical activity for  a minimum of 30 minutes , and to commit weight loss. The fact that changes in all these areas will reduce or eliminate all together the development of diabetes is stressed.      Latest Ref Rng & Units 09/03/2022    8:28 AM 04/02/2022    8:35 AM 09/17/2021   10:24 AM 03/02/2021    9:59 AM 07/20/2020   10:13 AM  Diabetic Labs  HbA1c 4.8 - 5.6 % 6.3  6.2  6.3  6.0    Chol 100 - 199 mg/dL 409  811  914   782   HDL >39 mg/dL 45  43  45   45   Calc LDL 0 - 99 mg/dL 97  956  213   086   Triglycerides 0 - 149 mg/dL 64  74  68   54   Creatinine 0.76 - 1.27 mg/dL 5.78  4.69  6.29   5.28       09/04/2022    9:01 AM 08/14/2022    9:59 AM 05/24/2022   12:28 PM 05/10/2022    2:23 PM 04/03/2022    9:04 AM 02/12/2022   11:24 AM 11/30/2021    9:59 AM  BP/Weight  Systolic BP 124 120 118 124 131 117 130  Diastolic BP 78 78 78 80 78 76 80  Wt. (Lbs) 206.12 206  204.2 202.04 197   BMI 27.95 kg/m2 27.94 kg/m2  27.69 kg/m2 27.4 kg/m2 26.72 kg/m2        No data to display  Deteriorrated, needs to lower starhes and processed sugar/ swets  Intractable migraine without aura and with status migrainosus Improved and controlled on current regime  Perennial allergic rhinitis Controlled, managed by Allergist  Overweight (BMI 25.0-29.9)  Patient re-educated about  the importance of commitment to a  minimum of 150 minutes of exercise per week as able.  The importance of healthy food choices with portion control discussed, as well as eating regularly and within a 12 hour window most days. The need to choose "clean , green" food 50 to 75% of the time is discussed, as well as to make water the primary drink and set a goal of 64 ounces water daily.       09/04/2022    9:01 AM 08/14/2022    9:59 AM 05/10/2022    2:23 PM  Weight /BMI  Weight 206 lb 1.9 oz 206 lb 204 lb 3.2 oz  Height 6' (1.829 m) 6' (1.829 m)   BMI 27.95 kg/m2 27.94 kg/m2 27.69 kg/m2    Unchnaged  Dyslipidemia, goal LDL below  100 Hyperlipidemia:Low fat diet discussed and encouraged.   Lipid Panel  Lab Results  Component Value Date   CHOL 155 09/03/2022   HDL 45 09/03/2022   LDLCALC 97 09/03/2022   TRIG 64 09/03/2022   CHOLHDL 3.4 09/03/2022     Controlled, no change in medication   Depression, major, single episode, severe (HCC) Improved since last visit, continue both lifestyle changes, therapy and med management

## 2022-09-04 NOTE — Assessment & Plan Note (Signed)
  Patient re-educated about  the importance of commitment to a  minimum of 150 minutes of exercise per week as able.  The importance of healthy food choices with portion control discussed, as well as eating regularly and within a 12 hour window most days. The need to choose "clean , green" food 50 to 75% of the time is discussed, as well as to make water the primary drink and set a goal of 64 ounces water daily.       09/04/2022    9:01 AM 08/14/2022    9:59 AM 05/10/2022    2:23 PM  Weight /BMI  Weight 206 lb 1.9 oz 206 lb 204 lb 3.2 oz  Height 6' (1.829 m) 6' (1.829 m)   BMI 27.95 kg/m2 27.94 kg/m2 27.69 kg/m2    Unchnaged

## 2022-09-04 NOTE — Assessment & Plan Note (Signed)
Controlled, managed by Allergist

## 2022-09-04 NOTE — Assessment & Plan Note (Signed)
Patient educated about the importance of limiting  Carbohydrate intake , the need to commit to daily physical activity for a minimum of 30 minutes , and to commit weight loss. The fact that changes in all these areas will reduce or eliminate all together the development of diabetes is stressed.      Latest Ref Rng & Units 09/03/2022    8:28 AM 04/02/2022    8:35 AM 09/17/2021   10:24 AM 03/02/2021    9:59 AM 07/20/2020   10:13 AM  Diabetic Labs  HbA1c 4.8 - 5.6 % 6.3  6.2  6.3  6.0    Chol 100 - 199 mg/dL 962  952  841   324   HDL >39 mg/dL 45  43  45   45   Calc LDL 0 - 99 mg/dL 97  401  027   253   Triglycerides 0 - 149 mg/dL 64  74  68   54   Creatinine 0.76 - 1.27 mg/dL 6.64  4.03  4.74   2.59       09/04/2022    9:01 AM 08/14/2022    9:59 AM 05/24/2022   12:28 PM 05/10/2022    2:23 PM 04/03/2022    9:04 AM 02/12/2022   11:24 AM 11/30/2021    9:59 AM  BP/Weight  Systolic BP 124 120 118 124 131 117 130  Diastolic BP 78 78 78 80 78 76 80  Wt. (Lbs) 206.12 206  204.2 202.04 197   BMI 27.95 kg/m2 27.94 kg/m2  27.69 kg/m2 27.4 kg/m2 26.72 kg/m2        No data to display          Deteriorrated, needs to lower starhes and processed sugar/ swets

## 2022-09-10 ENCOUNTER — Ambulatory Visit: Payer: Commercial Managed Care - PPO | Admitting: Podiatry

## 2022-09-10 DIAGNOSIS — B351 Tinea unguium: Secondary | ICD-10-CM

## 2022-09-10 MED ORDER — NONFORMULARY OR COMPOUNDED ITEM: Status: AC

## 2022-09-10 NOTE — Patient Instructions (Signed)
I have ordered a medication for you that will come from [facility] in [place]. They should be calling you to verify insurance and will mail the medication to you. If you live close by then you can go by their pharmacy to pick up the medication. Their phone number is [phone]. If you do not hear from them in the next few days, please give us a call at [phone].   

## 2022-09-16 NOTE — Progress Notes (Signed)
Subjective: Chief Complaint  Patient presents with   Nail Problem    Rm 13  Pt is in office today requesting new compound Rx for nail fungus. Pt states Rx worked well. No new concerns or questions.    47 year old male presents the office with above concerns.  He states that he did well on the topical medication, from [place].  Nails are starting to become thickened and discolored  again.  No pain in the nails, swelling, redness or drainage.  Objective: AAO x3, NAD DP/PT pulses palpable bilaterally, CRT less than 3 seconds Nails are hypertrophic, dystrophic with yellow, brown discoloration.  No edema, erythema or signs of infection.  No open lesions. No pain with calf compression, swelling, warmth, erythema  Assessment: Onychomycosis  Plan: -All treatment options discussed with the patient including all alternatives, risks, complications.  -We discussed treatment options including oral, topical as well as alternative treatments.  He wants to proceed with the topical compound medication through [facility].  I refaxed this for him. -Patient encouraged to call the office with any questions, concerns, change in symptoms.   Vivi Barrack DPM

## 2022-09-17 ENCOUNTER — Encounter: Payer: Self-pay | Admitting: Internal Medicine

## 2022-09-27 ENCOUNTER — Ambulatory Visit (INDEPENDENT_AMBULATORY_CARE_PROVIDER_SITE_OTHER): Payer: Commercial Managed Care - PPO | Admitting: Psychiatry

## 2022-09-27 DIAGNOSIS — F322 Major depressive disorder, single episode, severe without psychotic features: Secondary | ICD-10-CM

## 2022-09-27 DIAGNOSIS — F330 Major depressive disorder, recurrent, mild: Secondary | ICD-10-CM

## 2022-09-27 NOTE — Progress Notes (Signed)
Virtual Visit via Video Note  I connected with Gabriel [name] on 09/27/22 at 10:15 AM EDT  by a video enabled telemedicine application and verified that I am speaking with the correct person using two identifiers.  Location: Patient: Home Provider: [facility] [place] office    I discussed the limitations of evaluation and management by telemedicine and the availability of in person appointments. The patient expressed understanding and agreed to proceed.   I provided 45 minutes of non-face-to-face time during this encounter.   [name], LCSW       THERAPIST PROGRESS NOTE    Session Time:  Friday 5/172024 10:15 AM - 11:00 AM   Participation Level: Active  Treatment Goals addressed:   Improve ability to manage stress and anxiety AEB reducing episodes of anxiety (worry & nervousness) from 4 x per week to 1 x per week for 60 days per pt's self-report  Report a decrease in anxiety symptoms as evidenced by an overall reduction in anxiety score by a minimum of 25% on the Generalized Anxiety Disorder Scale for 2 months.     Progress in treatment:  progressing    Interventions: CBT and Supportive  Summary: Gabriel [name] is a 47 y.o. male who is referred for services by PCP Dr. Syliva Overman due to patient experiencing symptoms of depression. He denies any psychiatric hospitalizations, He saw therapist Dorann Lodge in [place] for a few months. Patient reports symptoms began when he lost his job about a year ago. Per his report, he was fired after he complained about race discrimination. 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 He states now feeling as though he failed his family. He states not wanting to be around anyone and having difficulty engaging with his wife and children. He fears losing his family.  He reports ruminating thoughts  about job and the past, excessive worry depressed mood, tearfulness, anxiety, isolative behaviors, poor motivation, poor concentration, irritability, sleep difficulty, thoughts and feelings of hopelessness and worthlessness. He denies any suicidal ideations.  Patient last was seen via virtual visit about 4 weeks ago. He reports feeling better physically since last session as migraines have decreased to 1-2 times per month.  He has been more active in the past week and a half.  Per his report, he has been walking daily and also has been listening to music.  He reports continued engagement with his children when they arrive home from school.  He also reports eating dinner with his family 3-4 times per week.  He reports he forgot to asked wife to print schedule to remind him of activities to pursue.  He also reports forgetting to prepare a list of ways he wants to increase social interaction.  Patient continues to express ambivalent feelings about doing this as he reports fearing rejection and hurt due to losing his job.     SI/HI .Nowithout intent/plan  Therapist Response:  reviewed symptoms, praised and reinforced patient's increased behavioral activation, assisted patient identify the effects of increased behavioral activation on his mood/thoughts/behavior, assisted patient identify his thought patterns regarding increasing social interaction beyond his immediate family, assisted patient identify a person he would like to contact, developed plan with patient to call a friend with whom he used to work, assisted patient identify thoughts and processes that may inhibit implementation of plan, used  cognitive diffusion to help patient relate differently to negative thoughts about calling friend, assisted patient identify his values of friendship/relationships to align with his goal of calling his friend, assisted patient identify benefits of implementing plan Diagnosis: Axis I: MDD, single episode,  severe    Axis II: No diagnosis  Collaboration of Care: Psychiatrist AEB patient working with psychiatrist Dr. Vanetta Shawl  Patient/Guardian was advised Release of Information must be obtained prior to any record release in order to collaborate their care with an outside provider. Patient/Guardian was advised if they have not already done so to contact the registration department to sign all necessary forms in order for [place] to release information regarding their care.   Consent: Patient/Guardian gives verbal consent for treatment and assignment of benefits for services provided during this visit. Patient/Guardian expressed understanding and agreed to proceed.   [name], LCSW 09/27/2022

## 2022-10-08 NOTE — Progress Notes (Signed)
Virtual Visit via Video Note  I connected with Gabriel [name] on 10/16/22 at 11:00 AM EDT by a video enabled telemedicine application and verified that I am speaking with the correct person using two identifiers.  Location: Patient: home Provider: office Persons participated in the visit- patient, provider    I discussed the limitations of evaluation and management by telemedicine and the availability of in person appointments. The patient expressed understanding and agreed to proceed.   I discussed the assessment and treatment plan with the patient. The patient was provided an opportunity to ask questions and all were answered. The patient agreed with the plan and demonstrated an understanding of the instructions.   The patient was advised to call back or seek an in-person evaluation if the symptoms worsen or if the condition fails to improve as anticipated.  I provided 15 minutes of non-face-to-face time during this encounter.   [name], MD       [facility] MD/PA/NP OP Progress Note  10/16/2022 11:32 AM Gabriel [name]  MRN:  [id number]  Chief Complaint:  Chief Complaint  Patient presents with   Follow-up   HPI:  This is a follow-up appointment for depression, anxiety.  He states that he has been more active.  He goes out for fresh air, takes a walk, and sits out in the porch.  He has been doing those a few times a week.  It is a stress relief, and he likes it.  Although his mood is okay, he occasionally has trouble with anxiety.  He feels jittery, and his mind is racing. He agrees that he is rocking during the visit due to some anxiety, although he also has pain and numbness.  He continues to struggle with migraine.  He has middle insomnia.  He denies change in appetite.  He denies SI.  He has not drink any caffeine.  He denies alcohol use or drug use.  Although he feels a little drowsy from gabapentin, it has been very helpful for his neuropathic pain.  He prefers to stay on  this medication.    Visit Diagnosis:    ICD-10-CM   1. MDD (major depressive disorder), recurrent episode, mild (HCC)  F33.0     2. Anxiety  F41.9     3. Insomnia, unspecified type  G47.00       Past Psychiatric History: Please see initial evaluation for full details. I have reviewed the history. No updates at this time.     Past Medical History:  Past Medical History:  Diagnosis Date   Anxiety    Carpal tunnel syndrome 2018   Depression    Depression    Phreesia 05/15/2020   Eczema    Elevated LFTs    Heart murmur    Insomnia    Seasonal allergies    Vitamin D deficiency     Past Surgical History:  Procedure Laterality Date   NO PAST SURGERIES      Family Psychiatric History: Please see initial evaluation for full details. I have reviewed the history. No updates at this time.     Family History:  Family History  Problem Relation Age of Onset   Allergic rhinitis Mother    Hypertension Mother    Diabetes Father    Hypertension Father    Hypertension Maternal Grandfather    Diabetes Maternal Grandfather    Cancer Paternal Grandmother        type unknown   Hypertension Paternal Grandmother    Cancer Paternal Grandfather  type unknown   Hypertension Paternal Grandfather     Social History:  Social History   Socioeconomic History   Marital status: Married    Spouse name: Not on file   Number of children: 2   Years of education: 12   Highest education level: High school graduate  Occupational History   Occupation: not employed  Tobacco Use   Smoking status: Never    Passive exposure: Never   Smokeless tobacco: Never  Vaping Use   Vaping Use: Never used  Substance and Sexual Activity   Alcohol use: No    Alcohol/week: 0.0 standard drinks of alcohol   Drug use: No   Sexual activity: Yes    Birth control/protection: None  Other Topics Concern   Not on file  Social History Narrative   He lives with wife and two children.   Unemployed at  this present time   Right handed   One story home   Highest level of education:  12th grade   Social Determinants of Health   Financial Resource Strain: Medium Risk (09/08/2017)   Overall Financial Resource Strain (CARDIA)    Difficulty of Paying Living Expenses: Somewhat hard  Food Insecurity: No Food Insecurity (09/08/2017)   Hunger Vital Sign    Worried About Running Out of Food in the Last Year: Never true    Ran Out of Food in the Last Year: Never true  Transportation Needs: No Transportation Needs (09/08/2017)   PRAPARE - Administrator, Civil Service (Medical): No    Lack of Transportation (Non-Medical): No  Physical Activity: Not on file  Stress: Stress Concern Present (09/08/2017)   [employer] of Occupational Health - Occupational Stress Questionnaire    Feeling of Stress : Very much  Social Connections: Not on file    Allergies: No Known Allergies  Metabolic Disorder Labs: Lab Results  Component Value Date   HGBA1C 6.3 (H) 09/03/2022   MPG 137 05/27/2019   MPG 137 12/29/2018   No results found for: "PROLACTIN" Lab Results  Component Value Date   CHOL 155 09/03/2022   TRIG 64 09/03/2022   HDL 45 09/03/2022   CHOLHDL 3.4 09/03/2022   VLDL 10 12/04/2016   LDLCALC 97 09/03/2022   LDLCALC 113 (H) 04/02/2022   Lab Results  Component Value Date   TSH 1.440 09/03/2022   TSH 1.790 09/17/2021    Therapeutic Level Labs: No results found for: "LITHIUM" No results found for: "VALPROATE" No results found for: "CBMZ"  Current Medications: Current Outpatient Medications  Medication Sig Dispense Refill   DULoxetine (CYMBALTA) 30 MG capsule Take 1 capsule (30 mg total) by mouth daily. Total of 90 mg daily. Take along with 60 mg cap 30 capsule 1   ARIPiprazole (ABILIFY) 2 MG tablet      azelastine (ASTELIN) 0.1 % nasal spray Place 1 spray into both nostrils 2 (two) times daily. Use in each nostril as directed 30 mL 5   Brexpiprazole (REXULTI) 3 MG  TABS Take 1 tablet (3 mg total) by mouth daily. 90 tablet 1   DULoxetine (CYMBALTA) 60 MG capsule Take 1 capsule (60 mg total) by mouth daily. 30 capsule 1   fluticasone (FLONASE) 50 MCG/ACT nasal spray Place 1 spray into both nostrils daily. 16 g 5   gabapentin (NEURONTIN) 100 MG capsule Take 1 capsule (100 mg total) by mouth 3 (three) times daily. 270 capsule 0   hydrocortisone 2.5 % cream APPLY TO FACE 2 TIMES DAILY FOR  3 WEEKS, THEN DAILY FOR 3 WEEKS, THEN EVERY OTHER DAY FOR 3 WEEKS THEN STOP. REPEAT AS NEEDED. 30 g 3   ketoconazole (NIZORAL) 2 % shampoo Apply to face twice weekly and back daily x 1 wk, then twice weekly. 120 mL 11   levocetirizine (XYZAL) 5 MG tablet Take 1 tablet (5 mg total) by mouth every evening. 90 tablet 2   meclizine (ANTIVERT) 25 MG tablet Take 1 tablet (25 mg total) by mouth 3 (three) times daily as needed for dizziness. 30 tablet 1   mirtazapine (REMERON) 30 MG tablet Take 1 tablet (30 mg total) by mouth at bedtime. 90 tablet 1   NONFORMULARY OR COMPOUNDED ITEM Terbinafine 3% Fluconazole 2% Tea Tree Oil 5% Urea 10% Ibuprofen 2% in DMSO Suspension #37ml Apply to affected nails once at bedtime or twice daily     topiramate (TOPAMAX) 100 MG tablet Take 1 tablet (100 mg total) by mouth 2 (two) times daily. 180 tablet 1   tretinoin (RETIN-A) 0.025 % cream Apply 1 application topically at bedtime.     Vitamin D, Ergocalciferol, (DRISDOL) 1.25 MG (50000 UNIT) CAPS capsule Take 1 capsule (50,000 Units total) by mouth every 7 (seven) days. 12 capsule 1   No current facility-administered medications for this visit.     Musculoskeletal: Strength & Muscle Tone:  N/A Gait & Station:  N/A Patient leans: N/A  Psychiatric Specialty Exam: Review of Systems  Psychiatric/Behavioral:  Positive for dysphoric mood and sleep disturbance. Negative for agitation, behavioral problems, confusion, decreased concentration, hallucinations, self-injury and suicidal ideas. The patient is  nervous/anxious. The patient is not hyperactive.   All other systems reviewed and are negative.   There were no vitals taken for this visit.There is no height or weight on file to calculate BMI.  General Appearance: Fairly Groomed  Eye Contact:  Good  Speech:  Clear and Coherent  Volume:  Normal  Mood:   okay  Affect:  Appropriate, Congruent, and calm  Thought Process:  Coherent  Orientation:  Full (Time, Place, and Person)  Thought Content: Logical   Suicidal Thoughts:  No  Homicidal Thoughts:  No  Memory:  Immediate;   Good  Judgement:  Good  Insight:  Good  Psychomotor Activity:  Normal  Concentration:  Concentration: Good and Attention Span: Good  Recall:  Good  Fund of Knowledge: Good  Language: Good  Akathisia:  No  Handed:  Right  AIMS (if indicated): not done  Assets:  Communication Skills Desire for Improvement  ADL's:  Intact  Cognition: WNL  Sleep:  Poor   Screenings: GAD-7    Advertising copywriter from 08/01/2022 in [facility] at [place] Counselor from 06/05/2022 in [facility] at [place] Counselor from 05/08/2022 in [facility] at [place] Counselor from 04/24/2022 in [facility] at [place] Counselor from 04/10/2022 in [facility] at [place]  Total GAD-7 Score 7 7 7 7 6       PHQ2-9    Flowsheet Row Office Visit from 09/04/2022 in [facility] Office Visit from 04/03/2022 in [facility] Office Visit from 02/12/2022 in [facility] Counselor from 01/30/2022 in [facility] at [place] Counselor from 12/20/2021 in [facility] at [facility] Total Score 2 2 2 2 2   PHQ-9 Total Score 9 11 10 9  10  Flowsheet Row ED from 05/24/2022 in [facility] at [facility] from 12/20/2021 in [facility] at [place] Counselor from 11/03/2020 in [facility] Out[facility] Behavior[facility] Health at   C-SSRS RISK CATEGORY No Risk No Risk No Risk        Assessment and Plan:  Gabriel [name] is a 47 y.o. year old male with a history of depression, bilateral carpel tunnel syndrome, who presents for follow up appointment for below.   1. MDD (major depressive disorder), recurrent episode, mild (HCC) 2. Anxiety Acute stressors include:  Other stressors include: unemployment, carpel tunnel syndrome, lower back pain, headache, loss of his maternal aunt    History:  not interested in TMS  Although there has been overall improvement in depressive symptoms since switching from venlafaxine to duloxetine, he continues to experience anxiety with neuropathic pain.  Will uptitrate duloxetine to optimize treatment for depression, anxiety and neuropathic pain.  Will continue mirtazapine and Abilify as adjunctive treatment for depression.  Will continue gabapentin for anxiety and neuropathic pain. Noted that although he reports mild drowsiness from the medication, he strongly prefers to continue with it, considering its significant benefits for pain relief.  3. Insomnia, unspecified type - sleep study was not consistent with sleep apnea in 2023    Unstable.  Will continue current dose of mirtazapine and gabapentin for insomnia at this time.    Plan  Increase duloxetine 90 mg daily  Continue mirtazapine 30 mg at night  Continue Rexulti 3 mg at night - monitor drowsiness  Change gabapentin 100 mg three times a day - monitor drowsiness Next appointment: 7/24 at 10 30 for 30 mins , video [email or web address].    - on tramadol   - on topiramate for migraine   Past trials of medication: sertraline, fluoxetine, mirtazapine, nortriptyline for carpel tunnel syndrome, bupropion (drowsiness), quetiapine (drowsiness), Abilify ("off balance"),  Trazodone, Ambien (limited benefit), lorazepam (worsening in anxiety, drowsiness) , temazepam, Lunesta      Collaboration of Care: Collaboration of Care: Other reviewed notes in Epic  Patient/Guardian was advised Release of Information must be obtained prior to any record release in order to collaborate their care with an outside provider. Patient/Guardian was advised if they have not already done so to contact the registration department to sign all necessary forms in order for [place] to release information regarding their care.   Consent: Patient/Guardian gives verbal consent for treatment and assignment of benefits for services provided during this visit. Patient/Guardian expressed understanding and agreed to proceed.    [name], MD 10/16/2022, 11:32 AM

## 2022-10-11 ENCOUNTER — Ambulatory Visit (INDEPENDENT_AMBULATORY_CARE_PROVIDER_SITE_OTHER): Payer: Commercial Managed Care - PPO | Admitting: Psychiatry

## 2022-10-11 DIAGNOSIS — F330 Major depressive disorder, recurrent, mild: Secondary | ICD-10-CM

## 2022-10-11 NOTE — Progress Notes (Signed)
Virtual Visit via Video Note  I connected with [name] on 10/11/22 at 10:11 AM EDT  by a video enabled telemedicine application and verified that I am speaking with the correct person using two identifiers.  Location: Patient: Home Provider: [facility] Outpatient [place] office    I discussed the limitations of evaluation and management by telemedicine and the availability of in person appointments. The patient expressed understanding and agreed to proceed.    I provided 44 minutes of non-face-to-face time during this encounter.   [name], LCSW       THERAPIST PROGRESS NOTE    Session Time:  Friday 5/172024 10:11 AM - 10:55 AM   Participation Level: Active  Treatment Goals addressed:   Improve ability to manage stress and anxiety AEB reducing episodes of anxiety (worry & nervousness) from 4 x per week to 1 x per week for 60 days per pt's self-report . Increase social interaction beyond immediate family to 3-4 x per week per pt's report.  Report a decrease in anxiety symptoms as evidenced by an overall reduction in anxiety score by a minimum of 25% on the Generalized Anxiety Disorder Scale for 2 months.     Progress in treatment:  progressing    Interventions: CBT and Supportive  Summary: Nil Amirian is a 47 y.o. male who is referred for services by PCP Dr. Syliva Overman due to patient experiencing symptoms of depression. He denies any psychiatric hospitalizations, He saw therapist Dorann Lodge in [place] for a few months. Patient reports symptoms began when he lost his job about a year ago. Per his report, he was fired after he complained about race discrimination. 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 He states now feeling as though he failed his family. He states not wanting to be around anyone and having difficulty engaging  with his wife and children. He fears losing his family.  He reports ruminating thoughts about job and the past, excessive worry depressed mood, tearfulness, anxiety, isolative behaviors, poor motivation, poor concentration, irritability, sleep difficulty, thoughts and feelings of hopelessness and worthlessness. He denies any suicidal ideations.  Patient last was seen via virtual visit about 2 weeks ago. He reports feeling particularly good today as he states waking up feeling good. He can't identify any possible triggers. He states feeling more motivated and going for a walk up and down his driveway this morning. He then enjoyed sitting on the porch and appreciating nature. He reports maintain an inconsistent social involvement with family eating dinner and conversing with family 3-4 times per week.  He also reports implementing plan to contact his friend.  However, he reports receiving a voicemail recording but was unable to leave a message.     SI/HI .Nowithout intent/plan  Therapist Response:  reviewed symptoms, praised and reinforced patient's increased behavioral activation, assisted patient identify the effects of increased behavioral activation on his mood/thoughts/behavior, developed plan with patient to continue walking up and down his driveway in the mornings, assisted patient try to identify factors that may have affected his mood and motivation this morning, discussed patient's sleep pattern as he still is having sleep difficulty waking up several times per night, discussed possible ways to improve sleep hygiene, also encouraged patient to talk with Dr. Vanetta Shawl regarding sleep issues during next medication management appointment, praised and reinforced patient's  efforts to contact his friend, discussed effects of attempt to contact friend on patient's thoughts/mood/behavior, developed plan with patient to attempt to contact friend again, also discussed possible resources to use to determine if he  has the right number, assisted patient identify other people he would like to contact them patient identifies 2 cousins, developed plan with patient to try to contact all 3 people by the next session, reviewed patient's value of friendship/relationships to align with his goal of calling his friend/cousins, reviewed treatment plan, obtained patient's permission to electronically signed plan for patient as this was a virtual visit, sent patient copy of plan via MyChart  Diagnosis: Axis I: MDD, single episode, severe    Axis II: No diagnosis  Collaboration of Care: Psychiatrist AEB patient working with psychiatrist Dr. Vanetta Shawl  Patient/Guardian was advised Release of Information must be obtained prior to any record release in order to collaborate their care with an outside provider. Patient/Guardian was advised if they have not already done so to contact the registration department to sign all necessary forms in order for [place] to release information regarding their care.   Consent: Patient/Guardian gives verbal consent for treatment and assignment of benefits for services provided during this visit. Patient/Guardian expressed understanding and agreed to proceed.   [name], LCSW 10/11/2022

## 2022-10-16 ENCOUNTER — Other Ambulatory Visit (HOSPITAL_COMMUNITY): Payer: Self-pay

## 2022-10-16 ENCOUNTER — Telehealth (INDEPENDENT_AMBULATORY_CARE_PROVIDER_SITE_OTHER): Payer: Commercial Managed Care - PPO | Admitting: Psychiatry

## 2022-10-16 ENCOUNTER — Encounter: Payer: Self-pay | Admitting: Psychiatry

## 2022-10-16 DIAGNOSIS — F419 Anxiety disorder, unspecified: Secondary | ICD-10-CM | POA: Diagnosis not present

## 2022-10-16 DIAGNOSIS — G4700 Insomnia, unspecified: Secondary | ICD-10-CM | POA: Diagnosis not present

## 2022-10-16 DIAGNOSIS — F330 Major depressive disorder, recurrent, mild: Secondary | ICD-10-CM

## 2022-10-16 MED ORDER — DULOXETINE HCL 30 MG PO CPEP
30.0000 mg | ORAL_CAPSULE | Freq: Every day | ORAL | 1 refills | Status: DC
  Filled 2022-10-16: qty 30, 30d supply, fill #0

## 2022-10-21 ENCOUNTER — Other Ambulatory Visit: Payer: Self-pay | Admitting: Family Medicine

## 2022-10-21 ENCOUNTER — Other Ambulatory Visit (HOSPITAL_COMMUNITY): Payer: Self-pay

## 2022-10-21 MED ORDER — TOPIRAMATE 100 MG PO TABS
100.0000 mg | ORAL_TABLET | Freq: Two times a day (BID) | ORAL | 1 refills | Status: DC
  Filled 2022-10-21: qty 180, 90d supply, fill #0
  Filled 2023-02-26: qty 180, 90d supply, fill #1

## 2022-10-25 ENCOUNTER — Ambulatory Visit (INDEPENDENT_AMBULATORY_CARE_PROVIDER_SITE_OTHER): Payer: Commercial Managed Care - PPO | Admitting: Psychiatry

## 2022-10-25 DIAGNOSIS — F330 Major depressive disorder, recurrent, mild: Secondary | ICD-10-CM | POA: Diagnosis not present

## 2022-10-25 NOTE — Progress Notes (Signed)
Virtual Visit via Video Note  I connected with Gabriel [name] on 10/25/22 at 10:05 AM EDT  by a video enabled telemedicine application and verified that I am speaking with the correct person using two identifiers.  Location: Patient: Home Provider: [facility] [place] office    I discussed the limitations of evaluation and management by telemedicine and the availability of in person appointments. The patient expressed understanding and agreed to proceed.  .  I provided 35 minutes of non-face-to-face time during this encounter.   [name], LCSW        THERAPIST PROGRESS NOTE    Session Time:  Friday 10/25/2022 10:05 AM -10:40 AM   Participation Level: Active  Treatment Goals addressed:   Improve ability to manage stress and anxiety AEB reducing episodes of anxiety (worry & nervousness) from 4 x per week to 1 x per week for 60 days per pt's self-report . Increase social interaction beyond immediate family to 3-4 x per week per pt's report.  Report a decrease in anxiety symptoms as evidenced by an overall reduction in anxiety score by a minimum of 25% on the Generalized Anxiety Disorder Scale for 2 months.     Progress in treatment:  progressing    Interventions: CBT and Supportive  Summary: Gabriel [name] is a 47 y.o. male who is referred for services by PCP Dr. Syliva Overman due to patient experiencing symptoms of depression. He denies any psychiatric hospitalizations, He saw therapist Dorann Lodge in [place] for a few months. Patient reports symptoms began when he lost his job about a year ago. Per his report, he was fired after he complained about race discrimination. 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 He states now feeling as though he failed his family. He states not wanting to be around anyone and having difficulty  engaging with his wife and children. He fears losing his family.  He reports ruminating thoughts about job and the past, excessive worry depressed mood, tearfulness, anxiety, isolative behaviors, poor motivation, poor concentration, irritability, sleep difficulty, thoughts and feelings of hopelessness and worthlessness. He denies any suicidal ideations.  Patient last was seen via virtual visit about 2 weeks ago. He reports decreased involvement in activity and increased restlessness/irritability due to back pain for the past 2 weeks.  He reports the pain disrupted his schedule and states mainly sitting around to try to relieve the pain.  He has not talked with his doctor regarding this yet as he hopes the pain will subside.  Per his report, he is feeling better today.  He is considering contacting doctor if it does not continue to subside.  Patient's report, he did not implement plan discussed in session as he was in pain.   SI/HI .Nowithout intent/plan  Therapist Response:  reviewed symptoms, discussed stressors, facilitated expression of thoughts and feelings, validated feelings, discussed rationale for and assisted patient develop pain/activity level chart to improve patient's skills in coping with pain as well as increase behavioral activation within his capability, assisted patient identify and address thoughts and processes that inhibited completion of plan to contact his friend and 2 cousins, developed plan with patient to try to contact all 3 people by the next session when, reviewed patient's value of friendship/relationships to align with his goals of calling his friend/cousins Diagnosis: Axis I: MDD, single episode,  severe    Axis II: No diagnosis  Collaboration of Care: Psychiatrist AEB patient working with psychiatrist Dr. Vanetta Shawl  Patient/Guardian was advised Release of Information must be obtained prior to any record release in order to collaborate their care with an outside provider.  Patient/Guardian was advised if they have not already done so to contact the registration department to sign all necessary forms in order for [place] to release information regarding their care.   Consent: Patient/Guardian gives verbal consent for treatment and assignment of benefits for services provided during this visit. Patient/Guardian expressed understanding and agreed to proceed.   [name], LCSW 10/25/2022

## 2022-11-21 ENCOUNTER — Ambulatory Visit (AMBULATORY_SURGERY_CENTER): Payer: Commercial Managed Care - PPO

## 2022-11-21 VITALS — Ht 72.0 in | Wt 215.0 lb

## 2022-11-21 DIAGNOSIS — Z1211 Encounter for screening for malignant neoplasm of colon: Secondary | ICD-10-CM

## 2022-11-21 MED ORDER — NA SULFATE-K SULFATE-MG SULF 17.5-3.13-1.6 GM/177ML PO SOLN: 1.0000 | Freq: Once | ORAL | 0 refills | Status: AC

## 2022-11-21 NOTE — Progress Notes (Signed)
No egg or soy allergy known to patient  No issues known to pt with past sedation with any surgeries or procedures Patient denies ever being told they had issues or difficulty with intubation  No FH of Malignant Hyperthermia Pt is not on diet pills Pt is not on  home 02  Pt is not on blood thinners  Pt denies issues with constipation  No A fib or A flutter Have any cardiac testing pending--no Ambulates independently. Pt instructed to use Singlecare.com or GoodRx for a price reduction on prep   

## 2022-11-25 NOTE — Progress Notes (Signed)
Virtual Visit via Video Note  I connected with Gabriel [name] on 12/04/22 at 10:30 AM EDT by a video enabled telemedicine application and verified that I am speaking with the correct person using two identifiers.  Location: Patient: home Provider: office Persons participated in the visit- patient, provider    I discussed the limitations of evaluation and management by telemedicine and the availability of in person appointments. The patient expressed understanding and agreed to proceed.    I discussed the assessment and treatment plan with the patient. The patient was provided an opportunity to ask questions and all were answered. The patient agreed with the plan and demonstrated an understanding of the instructions.   The patient was advised to call back or seek an in-person evaluation if the symptoms worsen or if the condition fails to improve as anticipated.  I provided 15 minutes of non-face-to-face time during this encounter.   [name], MD    [facility] MD/PA/NP OP Progress Note  12/04/2022 11:04 AM Gabriel [name]  MRN:  [id number]  Chief Complaint:  Chief Complaint  Patient presents with   Follow-up   HPI:  This is a follow-up appointment for depression, anxiety and insomnia.  He states that he has been doing well.  He sits on the porch during the day, which has been calm and relaxing.  He enjoys the time, spending with his family.  Although he feels down at times, it is not as it used to.  Although he used to feel locked in, he is able to come out of it.  He tries not to think about things when he feels anxious, and he thinks it has been better.  He continues to experience initial and middle insomnia without any reason.  Although sometimes the pain bothers him, there is no reason on other occasion.  He does not recall he tried a higher dose of gabapentin, and is willing to try higher dose at night.  He denies change in appetite.  He denies SI.  He denies alcohol use or drug  use.  He has not noticed any change in his mood or pain since uptitration of duloxetine.   Visit Diagnosis:    ICD-10-CM   1. MDD (major depressive disorder), recurrent episode, mild (HCC)  F33.0     2. Anxiety  F41.9     3. Insomnia, unspecified type  G47.00       Past Psychiatric History: Please see initial evaluation for full details. I have reviewed the history. No updates at this time.     Past Medical History:  Past Medical History:  Diagnosis Date   Allergy    Anxiety    Carpal tunnel syndrome 2018   Depression    Depression    Phreesia 05/15/2020   Eczema    Elevated LFTs    Heart murmur    Insomnia    Seasonal allergies    Vitamin D deficiency     Past Surgical History:  Procedure Laterality Date   NO PAST SURGERIES      Family Psychiatric History: Please see initial evaluation for full details. I have reviewed the history. No updates at this time.     Family History:  Family History  Problem Relation Age of Onset   Allergic rhinitis Mother    Hypertension Mother    Colon polyps Father    Diabetes Father    Hypertension Father    Hypertension Maternal Grandfather    Diabetes Maternal Grandfather    Cancer Paternal  Grandmother        type unknown   Hypertension Paternal Grandmother    Cancer Paternal Grandfather        type unknown   Hypertension Paternal Grandfather    Colon cancer Neg Hx    Esophageal cancer Neg Hx    Rectal cancer Neg Hx    Stomach cancer Neg Hx     Social History:  Social History   Socioeconomic History   Marital status: Married    Spouse name: Not on file   Number of children: 2   Years of education: 12   Highest education level: High school graduate  Occupational History   Occupation: not employed  Tobacco Use   Smoking status: Never    Passive exposure: Never   Smokeless tobacco: Never  Vaping Use   Vaping status: Never Used  Substance and Sexual Activity   Alcohol use: No    Alcohol/week: 0.0 standard  drinks of alcohol   Drug use: No   Sexual activity: Yes    Birth control/protection: None  Other Topics Concern   Not on file  Social History Narrative   He lives with wife and two children.   Unemployed at this present time   Right handed   One story home   Highest level of education:  12th grade   Social Determinants of Health   Financial Resource Strain: Medium Risk (09/08/2017)   Overall Financial Resource Strain (CARDIA)    Difficulty of Paying Living Expenses: Somewhat hard  Food Insecurity: No Food Insecurity (09/08/2017)   Hunger Vital Sign    Worried About Running Out of Food in the Last Year: Never true    Ran Out of Food in the Last Year: Never true  Transportation Needs: No Transportation Needs (09/08/2017)   PRAPARE - Administrator, Civil Service (Medical): No    Lack of Transportation (Non-Medical): No  Physical Activity: Not on file  Stress: Stress Concern Present (09/08/2017)   [employer] of Occupational Health - Occupational Stress Questionnaire    Feeling of Stress : Very much  Social Connections: Not on file    Allergies: No Known Allergies  Metabolic Disorder Labs: Lab Results  Component Value Date   HGBA1C 6.3 (H) 09/03/2022   MPG 137 05/27/2019   MPG 137 12/29/2018   No results found for: "PROLACTIN" Lab Results  Component Value Date   CHOL 155 09/03/2022   TRIG 64 09/03/2022   HDL 45 09/03/2022   CHOLHDL 3.4 09/03/2022   VLDL 10 12/04/2016   LDLCALC 97 09/03/2022   LDLCALC 113 (H) 04/02/2022   Lab Results  Component Value Date   TSH 1.440 09/03/2022   TSH 1.790 09/17/2021    Therapeutic Level Labs: No results found for: "LITHIUM" No results found for: "VALPROATE" No results found for: "CBMZ"  Current Medications: Current Outpatient Medications  Medication Sig Dispense Refill   azelastine (ASTELIN) 0.1 % nasal spray Place 1 spray into both nostrils 2 (two) times daily. Use in each nostril as directed 30 mL 5    Brexpiprazole (REXULTI) 3 MG TABS Take 1 tablet (3 mg total) by mouth daily. 90 tablet 1   [START ON 12/27/2022] DULoxetine (CYMBALTA) 60 MG capsule Take 1 capsule (60 mg total) by mouth daily. 90 capsule 0   fluticasone (FLONASE) 50 MCG/ACT nasal spray Place 1 spray into both nostrils daily. 16 g 5   gabapentin (NEURONTIN) 100 MG capsule Take 1 capsule (100 mg total) by mouth 2 (  two) times daily AND 2 capsules (200 mg total) at bedtime. 360 capsule 0   hydrocortisone 2.5 % cream APPLY TO FACE 2 TIMES DAILY FOR 3 WEEKS, THEN DAILY FOR 3 WEEKS, THEN EVERY OTHER DAY FOR 3 WEEKS THEN STOP. REPEAT AS NEEDED. 30 g 3   ketoconazole (NIZORAL) 2 % shampoo Apply to face twice weekly and back daily x 1 wk, then twice weekly. 120 mL 11   levocetirizine (XYZAL) 5 MG tablet Take 1 tablet (5 mg total) by mouth every evening. 90 tablet 2   loratadine (CLARITIN) 10 MG tablet Take 10 mg by mouth daily as needed.     meclizine (ANTIVERT) 25 MG tablet Take 1 tablet (25 mg total) by mouth 3 (three) times daily as needed for dizziness. 30 tablet 1   mirtazapine (REMERON) 30 MG tablet Take 1 tablet (30 mg total) by mouth at bedtime. 90 tablet 1   NONFORMULARY OR COMPOUNDED ITEM Terbinafine 3% Fluconazole 2% Tea Tree Oil 5% Urea 10% Ibuprofen 2% in DMSO Suspension #63ml Apply to affected nails once at bedtime or twice daily     topiramate (TOPAMAX) 100 MG tablet Take 1 tablet (100 mg total) by mouth 2 (two) times daily. 180 tablet 1   tretinoin (RETIN-A) 0.025 % cream Apply 1 application topically at bedtime.     Vitamin D, Ergocalciferol, (DRISDOL) 1.25 MG (50000 UNIT) CAPS capsule Take 1 capsule (50,000 Units total) by mouth every 7 (seven) days. 12 capsule 1   No current facility-administered medications for this visit.     Musculoskeletal: Strength & Muscle Tone:  N/A Gait & Station:  N/A Patient leans: N/A  Psychiatric Specialty Exam: Review of Systems  Psychiatric/Behavioral:  Positive for dysphoric mood  and sleep disturbance. Negative for agitation, behavioral problems, confusion, decreased concentration, hallucinations, self-injury and suicidal ideas. The patient is not nervous/anxious and is not hyperactive.   All other systems reviewed and are negative.   There were no vitals taken for this visit.There is no height or weight on file to calculate BMI.  General Appearance: Fairly Groomed  Eye Contact:  Good  Speech:  Clear and Coherent  Volume:  Normal  Mood:   good  Affect:  Appropriate, Congruent, and calm  Thought Process:  Coherent  Orientation:  Full (Time, Place, and Person)  Thought Content: Logical   Suicidal Thoughts:  No  Homicidal Thoughts:  No  Memory:  Immediate;   Good  Judgement:  Good  Insight:  Good  Psychomotor Activity:  Normal  Concentration:  Concentration: Good and Attention Span: Good  Recall:  Good  Fund of Knowledge: Good  Language: Good  Akathisia:  No  Handed:  Right  AIMS (if indicated): not done  Assets:  Communication Skills Desire for Improvement  ADL's:  Intact  Cognition: WNL  Sleep:  Poor   Screenings: GAD-7    Advertising copywriter from 10/25/2022 in [facility] [place] Counselor from 08/01/2022 in [facility] [place] Counselor from 06/05/2022 in [facility] at [place] Counsel[facility] from 05/08/2022 in [facility] at [place] Counselor from 04/24/2022 in [facility] at [place]  Total GAD-7 Score 10 7 7 7 7       PHQ2-9    Flowsheet Row Office Visit from 09/04/2022 in [facility] Office Visit from 04/03/2022 in [facility] Office Visit from 02/12/2022 in [facility] Counselor  from 01/30/2022 in [facility] [place] Counselor from 12/20/2021 in [facility]  at [facility] Total Score 2 2 2 2 2   PHQ-9 Total Score 9 11 10 9 10       Flowsheet Row ED from 05/24/2022 in [facility] at [facility] from 12/20/2021 in [facility] at [place] Counselor from 11/03/2020 in [facility] at [place]  C-SSRS RISK CATEGORY No Risk No Risk No Risk        Assessment and Plan:  Gabriel [name] is a 47 y.o. year old male with a history of depression, bilateral carpel tunnel syndrome, who presents for follow up appointment for below.   1. MDD (major depressive disorder), recurrent episode, mild (HCC) 2. Anxiety Acute stressors include:  Other stressors include: unemployment, carpel tunnel syndrome, lower back pain, headache, loss of his maternal aunt    History:  not interested in TMS  There has been overall improved mood and depressive symptoms and anxiety since switching from venlafaxine to duloxetine, although he has limited benefit from higher dose at this time.  Will lower the dose of duloxetine to avoid polypharmacy.  Will continue mirtazapine and Abilify adjunctive treatment for depression.  Will gabapentin for neuropathic pain and anxiety.   3. Insomnia, unspecified type - sleep study was not consistent with sleep apnea in 2023    He continues to struggle with insomnia.  Will try higher dose of gabapentin to see if it is effective for both neurological and insomnia.  Discussed potential risk of drowsiness.   Plan  Decrease duloxetine 60 mg daily (limited benefit from higher dose)  Continue mirtazapine 30 mg at night  Continue Rexulti 3 mg at night - monitor drowsiness  Change gabapentin 100 mg three times a day - monitor drowsiness Next appointment: 9/4 at 11 am for 30 mins , video [email or web address].    - on tramadol   - on topiramate for migraine   Past trials of medication: sertraline, fluoxetine, mirtazapine, nortriptyline for carpel tunnel syndrome,  bupropion (drowsiness), quetiapine (drowsiness), Abilify ("off balance"), Trazodone, Ambien (limited benefit), lorazepam (worsening in anxiety, drowsiness) , temazepam, Lunesta      Collaboration of Care: Collaboration of Care: Other reviewed notes in Epic  Patient/Guardian was advised Release of Information must be obtained prior to any record release in order to collaborate their care with an outside provider. Patient/Guardian was advised if they have not already done so to contact the registration department to sign all necessary forms in order for [place] to release information regarding their care.   Consent: Patient/Guardian gives verbal consent for treatment and assignment of benefits for services provided during this visit. Patient/Guardian expressed understanding and agreed to proceed.    [name], MD 12/04/2022, 11:04 AM

## 2022-11-26 ENCOUNTER — Other Ambulatory Visit (HOSPITAL_COMMUNITY): Payer: Self-pay

## 2022-11-26 ENCOUNTER — Other Ambulatory Visit: Payer: Self-pay

## 2022-11-27 ENCOUNTER — Other Ambulatory Visit (HOSPITAL_COMMUNITY): Payer: Self-pay

## 2022-12-04 ENCOUNTER — Encounter: Payer: Self-pay | Admitting: Psychiatry

## 2022-12-04 ENCOUNTER — Telehealth (INDEPENDENT_AMBULATORY_CARE_PROVIDER_SITE_OTHER): Payer: Commercial Managed Care - PPO | Admitting: Psychiatry

## 2022-12-04 ENCOUNTER — Other Ambulatory Visit (HOSPITAL_COMMUNITY): Payer: Self-pay

## 2022-12-04 DIAGNOSIS — F330 Major depressive disorder, recurrent, mild: Secondary | ICD-10-CM

## 2022-12-04 DIAGNOSIS — F419 Anxiety disorder, unspecified: Secondary | ICD-10-CM | POA: Diagnosis not present

## 2022-12-04 DIAGNOSIS — G4700 Insomnia, unspecified: Secondary | ICD-10-CM

## 2022-12-04 MED ORDER — REXULTI 3 MG PO TABS
3.0000 mg | ORAL_TABLET | Freq: Every day | ORAL | 1 refills | Status: DC
  Filled 2022-12-04: qty 90, 90d supply, fill #0
  Filled 2023-05-09: qty 90, 90d supply, fill #1

## 2022-12-04 MED ORDER — GABAPENTIN 100 MG PO CAPS
ORAL_CAPSULE | ORAL | 0 refills | Status: DC
Start: 2022-12-04 — End: 2023-02-26
  Filled 2022-12-04: qty 360, 90d supply, fill #0

## 2022-12-04 MED ORDER — DULOXETINE HCL 60 MG PO CPEP
60.0000 mg | ORAL_CAPSULE | Freq: Every day | ORAL | 0 refills | Status: DC
  Filled 2022-12-04 – 2023-02-26 (×2): qty 90, 90d supply, fill #0

## 2022-12-04 NOTE — Patient Instructions (Signed)
Decrease duloxetine 60 mg daily  Continue mirtazapine 30 mg at night  Continue Rexulti 3 mg at night  Change gabapentin 100 mg three times a day  Next appointment: 9/4 at 11 am

## 2022-12-09 ENCOUNTER — Encounter: Payer: Self-pay | Admitting: Internal Medicine

## 2022-12-19 ENCOUNTER — Ambulatory Visit (AMBULATORY_SURGERY_CENTER): Payer: Commercial Managed Care - PPO | Admitting: Internal Medicine

## 2022-12-19 ENCOUNTER — Encounter: Payer: Self-pay | Admitting: Internal Medicine

## 2022-12-19 VITALS — BP 114/75 | HR 71 | Temp 97.8°F | Resp 16 | Ht 72.0 in | Wt 215.0 lb

## 2022-12-19 DIAGNOSIS — D123 Benign neoplasm of transverse colon: Secondary | ICD-10-CM | POA: Diagnosis not present

## 2022-12-19 DIAGNOSIS — Z1211 Encounter for screening for malignant neoplasm of colon: Secondary | ICD-10-CM

## 2022-12-19 DIAGNOSIS — K635 Polyp of colon: Secondary | ICD-10-CM | POA: Diagnosis not present

## 2022-12-19 DIAGNOSIS — F329 Major depressive disorder, single episode, unspecified: Secondary | ICD-10-CM | POA: Diagnosis not present

## 2022-12-19 DIAGNOSIS — F419 Anxiety disorder, unspecified: Secondary | ICD-10-CM | POA: Diagnosis not present

## 2022-12-19 DIAGNOSIS — E669 Obesity, unspecified: Secondary | ICD-10-CM | POA: Diagnosis not present

## 2022-12-19 MED ORDER — SODIUM CHLORIDE 0.9 % IV SOLN: 500.0000 mL | Freq: Once | INTRAVENOUS | Status: DC

## 2022-12-19 NOTE — Patient Instructions (Signed)
 Handout on polyps given to patient. Await pathology results. Resume previous diet and continue present medications.  Repeat colonoscopy for surveillance will be determined based off of pathology results.   YOU HAD AN ENDOSCOPIC PROCEDURE TODAY AT [facility]:   Refer to the procedure report that was given to you for any specific questions about what was found during the examination.  If the procedure report does not answer your questions, please call your gastroenterologist to clarify.  If you requested that your care partner not be given the details of your procedure findings, then the procedure report has been included in a sealed envelope for you to review at your convenience later.  YOU SHOULD EXPECT: Some feelings of bloating in the abdomen. Passage of more gas than usual.  Walking can help get rid of the air that was put into your GI tract during the procedure and reduce the bloating. If you had a lower endoscopy (such as a colonoscopy or flexible sigmoidoscopy) you may notice spotting of blood in your stool or on the toilet paper. If you underwent a bowel prep for your procedure, you may not have a normal bowel movement for a few days.  Please Note:  You might notice some irritation and congestion in your nose or some drainage.  This is from the oxygen used during your procedure.  There is no need for concern and it should clear up in a day or so.  SYMPTOMS TO REPORT IMMEDIATELY:  Following lower endoscopy (colonoscopy or flexible sigmoidoscopy):  Excessive amounts of blood in the stool  Significant tenderness or worsening of abdominal pains  Swelling of the abdomen that is new, acute  Fever of 100F or higher  For urgent or emergent issues, a gastroenterologist can be reached at any hour by calling [phone]. Do not use MyChart messaging for urgent concerns.    DIET:  We do recommend a small meal at first, but then you may proceed to your regular diet.  Drink  plenty of fluids but you should avoid alcoholic beverages for 24 hours.  ACTIVITY:  You should plan to take it easy for the rest of today and you should NOT DRIVE or use heavy machinery until tomorrow (because of the sedation medicines used during the test).    FOLLOW UP: Our staff will call the number listed on your records the next business day following your procedure.  We will call around 7:15- 8:00 am to check on you and address any questions or concerns that you may have regarding the information given to you following your procedure. If we do not reach you, we will leave a message.     If any biopsies were taken you will be contacted by phone or by letter within the next 1-3 weeks.  Please call us at [phone] if you have not heard about the biopsies in 3 weeks.    SIGNATURES/CONFIDENTIALITY: You and/or your care partner have signed paperwork which will be entered into your electronic medical record.  These signatures attest to the fact that that the information above on your After Visit Summary has been reviewed and is understood.  Full responsibility of the confidentiality of this discharge information lies with you and/or your care-partner.

## 2022-12-19 NOTE — Progress Notes (Signed)
GASTROENTEROLOGY PROCEDURE H&P NOTE   Primary Care Physician: Kerri Perches, MD    Reason for Procedure:  Colon cancer screening  Plan:    Colonoscopy  Patient is appropriate for endoscopic procedure(s) in the ambulatory ([facility]) setting.  The nature of the procedure, as well as the risks, benefits, and alternatives were carefully and thoroughly reviewed with the patient. Ample time for discussion and questions allowed. The patient understood, was satisfied, and agreed to proceed.     HPI: Gabriel [name] is a 47 y.o. male who presents for colonoscopy.  Medical history as below.  Tolerated the prep.  No recent chest pain or shortness of breath.  No abdominal pain today.  Past Medical History:  Diagnosis Date   Allergy    Anxiety    Carpal tunnel syndrome 2018   Depression    Depression    Phreesia 05/15/2020   Eczema    Elevated LFTs    Heart murmur    Insomnia    Seasonal allergies    Vitamin D deficiency     Past Surgical History:  Procedure Laterality Date   NO PAST SURGERIES      Prior to Admission medications   Medication Sig Start Date End Date Taking? Authorizing Provider  Brexpiprazole (REXULTI) 3 MG TABS Take 1 tablet (3 mg total) by mouth daily. 12/04/22  Yes Hisada, Barbee Cough, MD  DULoxetine (CYMBALTA) 60 MG capsule Take 1 capsule (60 mg total) by mouth daily. 12/27/22  Yes Hisada, Barbee Cough, MD  fluticasone (FLONASE) 50 MCG/ACT nasal spray Place 1 spray into both nostrils daily. 05/10/22  Yes Alfonse Spruce, MD  gabapentin (NEURONTIN) 100 MG capsule Take 1 capsule (100 mg total) by mouth 2 (two) times daily AND 2 capsules (200 mg total) at bedtime. 12/04/22 03/05/23 Yes Hisada, Barbee Cough, MD  loratadine (CLARITIN) 10 MG tablet Take 10 mg by mouth daily as needed. 10/19/12  Yes [provider]  meclizine (ANTIVERT) 25 MG tablet Take 1 tablet (25 mg total) by mouth 3 (three) times daily as needed for dizziness. 02/12/22  Yes Gilmore Laroche, FNP   mirtazapine (REMERON) 30 MG tablet Take 1 tablet (30 mg total) by mouth at bedtime. 09/02/22 03/01/23 Yes [name], MD  NONFORMULARY OR COMPOUNDED ITEM Terbinafine 3% Fluconazole 2% Tea Tree Oil 5% Urea 10% Ibuprofen 2% in DMSO Suspension #68ml Apply to affected nails once at bedtime or twice daily 09/10/22  Yes Vivi Barrack, DPM  azelastine (ASTELIN) 0.1 % nasal spray Place 1 spray into both nostrils 2 (two) times daily. Use in each nostril as directed 05/10/22 11/21/22  Alfonse Spruce, MD  hydrocortisone 2.5 % cream APPLY TO FACE 2 TIMES DAILY FOR 3 WEEKS, THEN DAILY FOR 3 WEEKS, THEN EVERY OTHER DAY FOR 3 WEEKS THEN STOP. REPEAT AS NEEDED. 05/03/21     ketoconazole (NIZORAL) 2 % shampoo Apply to face twice weekly and back daily x 1 wk, then twice weekly. 09/28/20     levocetirizine (XYZAL) 5 MG tablet Take 1 tablet (5 mg total) by mouth every evening. 05/10/22 11/21/22  Alfonse Spruce, MD  topiramate (TOPAMAX) 100 MG tablet Take 1 tablet (100 mg total) by mouth 2 (two) times daily. 10/21/22   Kerri Perches, MD  tretinoin (RETIN-A) 0.025 % cream Apply 1 application topically at bedtime. 10/13/18   [provider]  Vitamin D, Ergocalciferol, (DRISDOL) 1.25 MG (50000 UNIT) CAPS capsule Take 1 capsule (50,000 Units total) by mouth every 7 (seven) days. 09/04/22  Kerri Perches, MD    Current Outpatient Medications  Medication Sig Dispense Refill   Brexpiprazole (REXULTI) 3 MG TABS Take 1 tablet (3 mg total) by mouth daily. 90 tablet 1   [START ON 12/27/2022] DULoxetine (CYMBALTA) 60 MG capsule Take 1 capsule (60 mg total) by mouth daily. 90 capsule 0   fluticasone (FLONASE) 50 MCG/ACT nasal spray Place 1 spray into both nostrils daily. 16 g 5   gabapentin (NEURONTIN) 100 MG capsule Take 1 capsule (100 mg total) by mouth 2 (two) times daily AND 2 capsules (200 mg total) at bedtime. 360 capsule 0   loratadine (CLARITIN) 10 MG tablet Take 10 mg by mouth daily as  needed.     meclizine (ANTIVERT) 25 MG tablet Take 1 tablet (25 mg total) by mouth 3 (three) times daily as needed for dizziness. 30 tablet 1   mirtazapine (REMERON) 30 MG tablet Take 1 tablet (30 mg total) by mouth at bedtime. 90 tablet 1   NONFORMULARY OR COMPOUNDED ITEM Terbinafine 3% Fluconazole 2% Tea Tree Oil 5% Urea 10% Ibuprofen 2% in DMSO Suspension #74ml Apply to affected nails once at bedtime or twice daily     azelastine (ASTELIN) 0.1 % nasal spray Place 1 spray into both nostrils 2 (two) times daily. Use in each nostril as directed 30 mL 5   hydrocortisone 2.5 % cream APPLY TO FACE 2 TIMES DAILY FOR 3 WEEKS, THEN DAILY FOR 3 WEEKS, THEN EVERY OTHER DAY FOR 3 WEEKS THEN STOP. REPEAT AS NEEDED. 30 g 3   ketoconazole (NIZORAL) 2 % shampoo Apply to face twice weekly and back daily x 1 wk, then twice weekly. 120 mL 11   levocetirizine (XYZAL) 5 MG tablet Take 1 tablet (5 mg total) by mouth every evening. 90 tablet 2   topiramate (TOPAMAX) 100 MG tablet Take 1 tablet (100 mg total) by mouth 2 (two) times daily. 180 tablet 1   tretinoin (RETIN-A) 0.025 % cream Apply 1 application topically at bedtime.     Vitamin D, Ergocalciferol, (DRISDOL) 1.25 MG (50000 UNIT) CAPS capsule Take 1 capsule (50,000 Units total) by mouth every 7 (seven) days. 12 capsule 1   Current Facility-Administered Medications  Medication Dose Route Frequency Provider Last Rate Last Admin   0.9 %  sodium chloride infusion  500 mL Intravenous Once , Carie Caddy, MD        Allergies as of 12/19/2022   (No Known Allergies)    Family History  Problem Relation Age of Onset   Allergic rhinitis Mother    Hypertension Mother    Colon polyps Father    Diabetes Father    Hypertension Father    Hypertension Maternal Grandfather    Diabetes Maternal Grandfather    Cancer Paternal Grandmother        type unknown   Hypertension Paternal Grandmother    Cancer Paternal Grandfather        type unknown   Hypertension  Paternal Grandfather    Colon cancer Neg Hx    Esophageal cancer Neg Hx    Rectal cancer Neg Hx    Stomach cancer Neg Hx     Social History   Socioeconomic History   Marital status: Married    Spouse name: Not on file   Number of children: 2   Years of education: 12   Highest education level: High school graduate  Occupational History   Occupation: not employed  Tobacco Use   Smoking status: Never  Passive exposure: Never   Smokeless tobacco: Never  Vaping Use   Vaping status: Never Used  Substance and Sexual Activity   Alcohol use: No    Alcohol/week: 0.0 standard drinks of alcohol   Drug use: No   Sexual activity: Yes    Birth control/protection: None  Other Topics Concern   Not on file  Social History Narrative   He lives with wife and two children.   Unemployed at this present time   Right handed   One story home   Highest level of education:  12th grade   Social Determinants of Health   Financial Resource Strain: Medium Risk (09/08/2017)   Overall Financial Resource Strain (CARDIA)    Difficulty of Paying Living Expenses: Somewhat hard  Food Insecurity: No Food Insecurity (09/08/2017)   Hunger Vital Sign    Worried About Running Out of Food in the Last Year: Never true    Ran Out of Food in the Last Year: Never true  Transportation Needs: No Transportation Needs (09/08/2017)   PRAPARE - Administrator, Civil Service (Medical): No    Lack of Transportation (Non-Medical): No  Physical Activity: Not on file  Stress: Stress Concern Present (09/08/2017)   [employer] of Occupational Health - Occupational Stress Questionnaire    Feeling of Stress : Very much  Social Connections: Not on file  Intimate Partner Violence: Not on file    Physical Exam: Vital signs in last 24 hours: @BP  120/70   Pulse 77   Temp 97.8 F (36.6 C) (Temporal)   Ht 6' (1.829 m)   Wt 215 lb (97.5 kg)   SpO2 99%   BMI 29.16 kg/m  GEN: NAD EYE: Sclerae  anicteric ENT: MMM CV: Non-tachycardic Pulm: CTA b/l GI: Soft, NT/ND NEURO:  Alert & Oriented x 3   Erick Blinks, MD [facility]  12/19/2022 9:14 AM

## 2022-12-19 NOTE — Op Note (Signed)
[facility] Patient Name: Gabriel [name] Procedure Date: 12/19/2022 9:17 AM MRN: [id number] Endoscopist: Beverley Fiedler , MD, [id number] Age: 47 Referring MD:  Date of Birth: 1975-05-23 Gender: Male Account #: [id number] Procedure:                Colonoscopy Indications:              Screening for colorectal malignant neoplasm, This                            is the patient's first colonoscopy Medicines:                Monitored Anesthesia Care Procedure:                Pre-Anesthesia Assessment:                           - Prior to the procedure, a History and Physical                            was performed, and patient medications and                            allergies were reviewed. The patient's tolerance of                            previous anesthesia was also reviewed. The risks                            and benefits of the procedure and the sedation                            options and risks were discussed with the patient.                            All questions were answered, and informed consent                            was obtained. Prior Anticoagulants: The patient has                            taken no anticoagulant or antiplatelet agents. ASA                            Grade Assessment: II - A patient with mild systemic                            disease. After reviewing the risks and benefits,                            the patient was deemed in satisfactory condition to                            undergo the procedure.  After obtaining informed consent, the colonoscope                            was passed under direct vision. Throughout the                            procedure, the patient's blood pressure, pulse, and                            oxygen saturations were monitored continuously. The                            Olympus Scope SN: [id number] was introduced through                            the anus and advanced to  the cecum, identified by                            appendiceal orifice and ileocecal valve. The                            colonoscopy was performed without difficulty. The                            patient tolerated the procedure well. The quality                            of the bowel preparation was good. The ileocecal                            valve, appendiceal orifice, and rectum were                            photographed. Scope In: 9:21:23 AM Scope Out: 9:34:12 AM Scope Withdrawal Time: 0 hours 11 minutes 14 seconds  Total Procedure Duration: 0 hours 12 minutes 49 seconds  Findings:                 The digital rectal exam was normal.                           Two sessile polyps were found in the transverse                            colon. The polyps were 3 to 5 mm in size. These                            polyps were removed with a cold snare. Resection                            and retrieval were complete.                           The exam was otherwise without abnormality on  direct and retroflexion views. Complications:            No immediate complications. Estimated Blood Loss:     Estimated blood loss: none. Impression:               - Two 3 to 5 mm polyps in the transverse colon,                            removed with a cold snare. Resected and retrieved.                           - The examination was otherwise normal on direct                            and retroflexion views. Recommendation:           - Patient has a contact number available for                            emergencies. The signs and symptoms of potential                            delayed complications were discussed with the                            patient. Return to normal activities tomorrow.                            Written discharge instructions were provided to the                            patient.                           - Resume previous diet.                            - Continue present medications.                           - Await pathology results.                           - Repeat colonoscopy is recommended. The                            colonoscopy date will be determined after pathology                            results from today's exam become available for                            review. Beverley Fiedler, MD 12/19/2022 9:37:23 AM This report has been signed electronically.

## 2022-12-19 NOTE — Progress Notes (Signed)
Pt's states no medical or surgical changes since previsit or office visit. 

## 2022-12-19 NOTE — Progress Notes (Signed)
Report to PACU, RN, vss, BBS= Clear.  

## 2022-12-19 NOTE — Progress Notes (Signed)
Called to room to assist during endoscopic procedure.  Patient ID and intended procedure confirmed with present staff. Received instructions for my participation in the procedure from the performing physician.  

## 2022-12-20 ENCOUNTER — Telehealth: Payer: Self-pay | Admitting: *Deleted

## 2022-12-20 NOTE — Telephone Encounter (Signed)
  Follow up Call-     12/19/2022    8:56 AM  Call back number  Post procedure Call Back phone  # [phone]  Permission to leave phone message Yes     Patient questions:   Message left to call us if  necessary.

## 2022-12-23 ENCOUNTER — Encounter: Payer: Self-pay | Admitting: Internal Medicine

## 2023-01-11 NOTE — Progress Notes (Unsigned)
Virtual Visit via Video Note  I connected with Gabriel [name] on 01/15/23 at 11:00 AM EDT by a video enabled telemedicine application and verified that I am speaking with the correct person using two identifiers.  Location: Patient: home Provider: office Persons participated in the visit- patient, provider    I discussed the limitations of evaluation and management by telemedicine and the availability of in person appointments. The patient expressed understanding and agreed to proceed.    I discussed the assessment and treatment plan with the patient. The patient was provided an opportunity to ask questions and all were answered. The patient agreed with the plan and demonstrated an understanding of the instructions.   The patient was advised to call back or seek an in-person evaluation if the symptoms worsen or if the condition fails to improve as anticipated.  I provided 25 minutes of non-face-to-face time during this encounter.   [name], MD     [facility] MD/PA/NP OP Progress Note  01/15/2023 11:39 AM Gabriel [name]  MRN:  [id number]  Chief Complaint:  Chief Complaint  Patient presents with   Follow-up   HPI:  This is a follow-up appointment for depression, anxiety and insomnia.  He states that he has been doing the same.  He tries to take a walk every morning, which has been helping for his mood and his leg.  He does not feel depressed, although he continues to have some struggles.  He feels fatigue mentally and physically, and has not been able to do things.  When he is asked if he is interested in trying part-time job, he states that he is not sure if he will be productive.  Although he had thought about it, it reminded him what he went through at his previous job.  He does not think the outlook will be good.  He is concerned that he is not treated fairly.  He agrees that he feels scared.  Explored the way he can make progress in line with his value.  He has middle  insomnia.  He takes gabapentin as it says in the bottle, and reports some drowsiness during the day.  He denies change in appetite.  He denies SI.  He denies alcohol use or drug use.   Wt Readings from Last 3 Encounters:  12/19/22 215 lb (97.5 kg)  11/21/22 215 lb (97.5 kg)  09/04/22 206 lb 1.9 oz (93.5 kg)     Visit Diagnosis:    ICD-10-CM   1. MDD (major depressive disorder), recurrent episode, mild (HCC)  F33.0     2. Anxiety  F41.9     3. Insomnia, unspecified type  G47.00       Past Psychiatric History: Please see initial evaluation for full details. I have reviewed the history. No updates at this time.     Past Medical History:  Past Medical History:  Diagnosis Date   Allergy    Anxiety    Carpal tunnel syndrome 2018   Depression    Depression    Phreesia 05/15/2020   Eczema    Elevated LFTs    Heart murmur    Insomnia    Seasonal allergies    Vitamin D deficiency     Past Surgical History:  Procedure Laterality Date   NO PAST SURGERIES      Family Psychiatric History: Please see initial evaluation for full details. I have reviewed the history. No updates at this time.     Family History:  Family History  Problem Relation Age of Onset   Allergic rhinitis Mother    Hypertension Mother    Colon polyps Father    Diabetes Father    Hypertension Father    Hypertension Maternal Grandfather    Diabetes Maternal Grandfather    Cancer Paternal Grandmother        type unknown   Hypertension Paternal Grandmother    Cancer Paternal Grandfather        type unknown   Hypertension Paternal Grandfather    Colon cancer Neg Hx    Esophageal cancer Neg Hx    Rectal cancer Neg Hx    Stomach cancer Neg Hx     Social History:  Social History   Socioeconomic History   Marital status: Married    Spouse name: Not on file   Number of children: 2   Years of education: 12   Highest education level: High school graduate  Occupational History   Occupation: not  employed  Tobacco Use   Smoking status: Never    Passive exposure: Never   Smokeless tobacco: Never  Vaping Use   Vaping status: Never Used  Substance and Sexual Activity   Alcohol use: No    Alcohol/week: 0.0 standard drinks of alcohol   Drug use: No   Sexual activity: Yes    Birth control/protection: None  Other Topics Concern   Not on file  Social History Narrative   He lives with wife and two children.   Unemployed at this present time   Right handed   One story home   Highest level of education:  12th grade   Social Determinants of Health   Financial Resource Strain: Medium Risk (09/08/2017)   Overall Financial Resource Strain (CARDIA)    Difficulty of Paying Living Expenses: Somewhat hard  Food Insecurity: No Food Insecurity (09/08/2017)   Hunger Vital Sign    Worried About Running Out of Food in the Last Year: Never true    Ran Out of Food in the Last Year: Never true  Transportation Needs: No Transportation Needs (09/08/2017)   PRAPARE - Administrator, Civil Service (Medical): No    Lack of Transportation (Non-Medical): No  Physical Activity: Not on file  Stress: Stress Concern Present (09/08/2017)   [employer] of Occupational Health - Occupational Stress Questionnaire    Feeling of Stress : Very much  Social Connections: Not on file    Allergies: No Known Allergies  Metabolic Disorder Labs: Lab Results  Component Value Date   HGBA1C 6.3 (H) 09/03/2022   MPG 137 05/27/2019   MPG 137 12/29/2018   No results found for: "PROLACTIN" Lab Results  Component Value Date   CHOL 155 09/03/2022   TRIG 64 09/03/2022   HDL 45 09/03/2022   CHOLHDL 3.4 09/03/2022   VLDL 10 12/04/2016   LDLCALC 97 09/03/2022   LDLCALC 113 (H) 04/02/2022   Lab Results  Component Value Date   TSH 1.440 09/03/2022   TSH 1.790 09/17/2021    Therapeutic Level Labs: No results found for: "LITHIUM" No results found for: "VALPROATE" No results found for:  "CBMZ"  Current Medications: Current Outpatient Medications  Medication Sig Dispense Refill   azelastine (ASTELIN) 0.1 % nasal spray Place 1 spray into both nostrils 2 (two) times daily. Use in each nostril as directed 30 mL 5   Brexpiprazole (REXULTI) 3 MG TABS Take 1 tablet (3 mg total) by mouth daily. 90 tablet 1   DULoxetine (CYMBALTA) 60 MG capsule Take  1 capsule (60 mg total) by mouth daily. 90 capsule 0   fluticasone (FLONASE) 50 MCG/ACT nasal spray Place 1 spray into both nostrils daily. 16 g 5   gabapentin (NEURONTIN) 100 MG capsule Take 1 capsule (100 mg total) by mouth 2 (two) times daily AND 2 capsules (200 mg total) at bedtime. (Patient taking differently: 100 mg daily, 300 mg at night) 360 capsule 0   hydrocortisone 2.5 % cream APPLY TO FACE 2 TIMES DAILY FOR 3 WEEKS, THEN DAILY FOR 3 WEEKS, THEN EVERY OTHER DAY FOR 3 WEEKS THEN STOP. REPEAT AS NEEDED. 30 g 3   ketoconazole (NIZORAL) 2 % shampoo Apply to face twice weekly and back daily x 1 wk, then twice weekly. 120 mL 11   levocetirizine (XYZAL) 5 MG tablet Take 1 tablet (5 mg total) by mouth every evening. 90 tablet 2   loratadine (CLARITIN) 10 MG tablet Take 10 mg by mouth daily as needed.     meclizine (ANTIVERT) 25 MG tablet Take 1 tablet (25 mg total) by mouth 3 (three) times daily as needed for dizziness. 30 tablet 1   mirtazapine (REMERON) 30 MG tablet Take 1 tablet (30 mg total) by mouth at bedtime. 90 tablet 1   NONFORMULARY OR COMPOUNDED ITEM Terbinafine 3% Fluconazole 2% Tea Tree Oil 5% Urea 10% Ibuprofen 2% in DMSO Suspension #43ml Apply to affected nails once at bedtime or twice daily     topiramate (TOPAMAX) 100 MG tablet Take 1 tablet (100 mg total) by mouth 2 (two) times daily. 180 tablet 1   tretinoin (RETIN-A) 0.025 % cream Apply 1 application topically at bedtime.     Vitamin D, Ergocalciferol, (DRISDOL) 1.25 MG (50000 UNIT) CAPS capsule Take 1 capsule (50,000 Units total) by mouth every 7 (seven) days. 12  capsule 1   No current facility-administered medications for this visit.     Musculoskeletal: Strength & Muscle Tone:  N/A Gait & Station:  N/A Patient leans: N/A  Psychiatric Specialty Exam: Review of Systems  Psychiatric/Behavioral:  Positive for decreased concentration and sleep disturbance. Negative for agitation, behavioral problems, confusion, dysphoric mood, hallucinations, self-injury and suicidal ideas. The patient is nervous/anxious. The patient is not hyperactive.   All other systems reviewed and are negative.   There were no vitals taken for this visit.There is no height or weight on file to calculate BMI.  General Appearance: Fairly Groomed  Eye Contact:  Good  Speech:  Clear and Coherent  Volume:  Normal  Mood:   okay  Affect:  Appropriate, Congruent, and fatigue  Thought Process:  Coherent  Orientation:  Full (Time, Place, and Person)  Thought Content: Logical   Suicidal Thoughts:  No  Homicidal Thoughts:  No  Memory:  Immediate;   Good  Judgement:  Good  Insight:  Good  Psychomotor Activity:  Normal  Concentration:  Concentration: Good and Attention Span: Good  Recall:  Good  Fund of Knowledge: Good  Language: Good  Akathisia:  No  Handed:  Right  AIMS (if indicated): not done  Assets:  Communication Skills Desire for Improvement  ADL's:  Intact  Cognition: WNL  Sleep:  Poor   Screenings: GAD-7    Advertising copywriter from 10/25/2022 in [facility] at [place] Counselor from 08/01/2022 in [facility] at [place] Counselor from 06/05/2022 in [facility] at [place] Counselor from 05/08/2022 in [facility] Outpatient Behavioral Health at [place] Counselor from 04/24/2022 in [facility] Outpatient  Behavioral Health at [facility]  Total GAD-7 Score 10 7 7 7 7       PHQ2-9    Flowsheet Row Office Visit from 09/04/2022 in [facility]  Office Visit from 04/03/2022 in [facility] Primary Care Office Visit from 02/12/2022 in [facility] Primary [facility] Counselor from 01/30/2022 in [facility] at [place] Counselor from 12/20/2021 in [facility] at [facility] Total Score 2 2 2 2 2   PHQ-9 Total Score 9 11 10 9 10       Flowsheet Row ED from 05/24/2022 in [facility] at [facility] from 12/20/2021 in [facility] Out[facility]al [facility] at [place] Counselor from 11/03/2020 in [facility] Outpatient Behavioral Health at [place]  C-SSRS RISK CATEGORY No Risk No Risk No Risk        Assessment and Plan:  Gabriel [name] is a 47 y.o. year old male with a history of depression, bilateral carpel tunnel syndrome, who presents for follow up appointment for below.   1. MDD (major depressive disorder), recurrent episode, mild (HCC) 2. Anxiety Acute stressors include:  Other stressors include: unemployment, carpel tunnel syndrome, lower back pain, headache, loss of his maternal aunt    History:  not interested in TMS  Although there has been overall improvement in depressive symptoms and anxiety, he reports heightened stress, which hinders him from trying to work.  Will continue current medication regimen at this time while he works through therapy.  Will continue duloxetine, mirtazapine and Abilify to target depression.  Will continue gabapentin for neuropathic pain and anxiety.   3. Insomnia, unspecified type - sleep study was not consistent with sleep apnea in 2023     He continues to experience middle insomnia, and reports some drowsiness from gabapentin.  He was advised to adjust the dose of gabapentin to see if it mitigates the adverse reaction, and to optimize treatment for insomnia.  Discussed risk of drowsiness.   Plan  Continue duloxetine 60 mg daily (limited benefit from higher dose)  Continue mirtazapine 30 mg at  night  Continue Rexulti 3 mg at night - monitor drowsiness  Change gabapentin 100 mg, 300 mg at night - was taking 200 mg BID  - monitor drowsiness Next appointment: 11/1 at 8 30 am for 30 mins , video [email or web address].  - on tramadol   - on topiramate for migraine   Past trials of medication: sertraline, fluoxetine, mirtazapine, nortriptyline for carpel tunnel syndrome, bupropion (drowsiness), quetiapine (drowsiness), Abilify ("off balance"), Trazodone, Ambien (limited benefit), lorazepam (worsening in anxiety, drowsiness) , temazepam, Lunesta      Collaboration of Care: Collaboration of Care: Other reviewed notes in Epic  Patient/Guardian was advised Release of Information must be obtained prior to any record release in order to collaborate their care with an outside provider. Patient/Guardian was advised if they have not already done so to contact the registration department to sign all necessary forms in order for [place] to release information regarding their care.   Consent: Patient/Guardian gives verbal consent for treatment and assignment of benefits for services provided during this visit. Patient/Guardian expressed understanding and agreed to proceed.    [name], MD 01/15/2023, 11:39 AM

## 2023-01-15 ENCOUNTER — Telehealth: Payer: Commercial Managed Care - PPO | Admitting: Psychiatry

## 2023-01-15 ENCOUNTER — Encounter: Payer: Self-pay | Admitting: Psychiatry

## 2023-01-15 DIAGNOSIS — F330 Major depressive disorder, recurrent, mild: Secondary | ICD-10-CM | POA: Diagnosis not present

## 2023-01-15 DIAGNOSIS — G4700 Insomnia, unspecified: Secondary | ICD-10-CM

## 2023-01-15 DIAGNOSIS — F419 Anxiety disorder, unspecified: Secondary | ICD-10-CM | POA: Diagnosis not present

## 2023-01-15 NOTE — Patient Instructions (Addendum)
Continue duloxetine 60 mg daily  Continue mirtazapine 30 mg at night  Continue Rexulti 3 mg at night  Change gabapentin 100 mg, 300 mg at night Next appointment: 11/1 at 8 30

## 2023-01-17 ENCOUNTER — Ambulatory Visit (HOSPITAL_COMMUNITY): Payer: Commercial Managed Care - PPO | Admitting: Psychiatry

## 2023-01-31 ENCOUNTER — Ambulatory Visit (INDEPENDENT_AMBULATORY_CARE_PROVIDER_SITE_OTHER): Payer: Commercial Managed Care - PPO | Admitting: Psychiatry

## 2023-01-31 DIAGNOSIS — F322 Major depressive disorder, single episode, severe without psychotic features: Secondary | ICD-10-CM | POA: Diagnosis not present

## 2023-01-31 DIAGNOSIS — F330 Major depressive disorder, recurrent, mild: Secondary | ICD-10-CM

## 2023-01-31 NOTE — Progress Notes (Unsigned)
Virtual Visit via Video Note  I connected with [name] on 01/31/23 at 10:07 AM EDT  by a video enabled telemedicine application and verified that I am speaking with the correct person using two identifiers.  Location: Patient: Home Provider: [facility] [place] office    I discussed the limitations of evaluation and management by telemedicine and the availability of in person appointments. The patient expressed understanding and agreed to proceed.   I provided 30 minutes of non-face-to-face time during this encounter.   [name], LCSW        THERAPIST PROGRESS NOTE    Session Time:  Friday 01/31/2023 10:07 AM - 10:37 AM   Participation Level: Active  Treatment Goals addressed:   Improve ability to manage stress and anxiety AEB reducing episodes of anxiety (worry & nervousness) from 4 x per week to 1 x per week for 60 days per pt's self-report . Increase social interaction beyond immediate family to 3-4 x per week per pt's report.  Report a decrease in anxiety symptoms as evidenced by an overall reduction in anxiety score by a minimum of 25% on the Generalized Anxiety Disorder Scale for 2 months.     Progress in treatment:  progressing    Interventions: CBT and Supportive  Summary: Arbie Lish is a 47 y.o. male who is referred for services by PCP Dr. Syliva Overman due to patient experiencing symptoms of depression. He denies any psychiatric hospitalizations, He saw therapist Dorann Lodge in [place] for a few months. Patient reports symptoms began when he lost his job about a year ago. Per his report, he was fired after he complained about race discrimination. 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 He states now feeling as though he failed his family. He states not wanting to be around anyone and having difficulty engaging  with his wife and children. He fears losing his family.  He reports ruminating thoughts about job and the past, excessive worry depressed mood, tearfulness, anxiety, isolative behaviors, poor motivation, poor concentration, irritability, sleep difficulty, thoughts and feelings of hopelessness and worthlessness. He denies any suicidal ideations.  Patient last was seen via virtual visit about 3 months ago. He reports increased involvement in activity and says he has been listening to music, sitting on the porch,  and going for a walk daily. He reports misplacing and and forgetting to use pain/activity level chart developed last session. He states feeling like he can manage the pain. He says it is overwhelming at times but also knowing what to do when it gets to that point. He reports continued involvement with  family including having dinner and conversing about 4 x per week. He reports attempts to contact 3 people discussed in last session but says the phone numbers must have been wrong. SI/HI .Nowithout intent/plan  Therapist Response:  reviewed symptoms, discussed stressors, facilitated expression of thoughts and feelings, validated feelings, praised and reinforced pt's increased involvement in activity, discussed effects, began to discuss pt's feelings about being  in treatment, will discuss more at next session.   Diagnosis: Axis I: MDD, single episode, severe    Axis II: No diagnosis  Collaboration of Care: Psychiatrist AEB patient working with psychiatrist Dr. Vanetta Shawl  Patient/Guardian was advised Release of Information must be obtained prior to any record release in order to collaborate their  care with an outside provider. Patient/Guardian was advised if they have not already done so to contact the registration department to sign all necessary forms in order for [place] to release information regarding their care.   Consent: Patient/Guardian gives verbal consent for treatment and assignment of  benefits for services provided during this visit. Patient/Guardian expressed understanding and agreed to proceed.   [name], LCSW 01/31/2023

## 2023-02-14 ENCOUNTER — Ambulatory Visit (INDEPENDENT_AMBULATORY_CARE_PROVIDER_SITE_OTHER): Payer: Commercial Managed Care - PPO | Admitting: Psychiatry

## 2023-02-14 DIAGNOSIS — F322 Major depressive disorder, single episode, severe without psychotic features: Secondary | ICD-10-CM | POA: Diagnosis not present

## 2023-02-14 DIAGNOSIS — F419 Anxiety disorder, unspecified: Secondary | ICD-10-CM

## 2023-02-14 DIAGNOSIS — F330 Major depressive disorder, recurrent, mild: Secondary | ICD-10-CM

## 2023-02-14 NOTE — Progress Notes (Signed)
Virtual Visit via Video Note  I connected with Gabriel [name] on 02/14/23 at 10:10 AM EDT  by a video enabled telemedicine application and verified that I am speaking with the correct person using two identifiers.  Location: Patient: Home Provider: [facility] [place] [facility]    I discussed the limitations of evaluation and management by telemedicine and the availability of in person appointments. The patient expressed understanding and agreed to proceed.   I provided 45 minutes of non-face-to-face time during this encounter.   [name], LCSW       THERAPIST PROGRESS NOTE    Session Time:  Friday 02/14/2023 10:10 AM - 10:55 AM   Participation Level: Active  Treatment Goals addressed:   Improve ability to manage stress and anxiety AEB reducing episodes of anxiety (worry & nervousness) from 4 x per week to 1 x per week for 60 days per pt's self-report . Increase social interaction beyond immediate family to 3-4 x per week per pt's report.  Report a decrease in anxiety symptoms as evidenced by an overall reduction in anxiety score by a minimum of 25% on the Generalized Anxiety Disorder Scale for 2 months.     Progress in treatment:  progressing    Interventions: CBT and Supportive  Summary: Gabriel [name] is a 47 y.o. male who is referred for services by PCP Dr. Syliva Overman due to patient experiencing symptoms of depression. He denies any psychiatric hospitalizations, He saw therapist Dorann Lodge in [place] for a few months. Patient reports symptoms began when he lost his job about a year ago. Per his report, he was fired after he complained about race discrimination. 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 He states now feeling as though he failed his family. He states not wanting to be around anyone and having difficulty engaging  with his wife and children. He fears losing his family.  He reports ruminating thoughts about job and the past, excessive worry depressed mood, tearfulness, anxiety, isolative behaviors, poor motivation, poor concentration, irritability, sleep difficulty, thoughts and feelings of hopelessness and worthlessness. He denies any suicidal ideations.  Patient last was seen via virtual visit about 2-3 weeks ago. He reports increased irritability in the past 4 days triggered by increased pain due to carpal tunnel syndrome.  Per his report, this results in unintentionally lashing out at his wife and isolating self.  He also reports experiencing increased rumination about losing his job.  He states has not been able to get past losing his job and things he had planned to do like being the provider for his family and being there for his sons.  He shares more information regarding his father not being there during his childhood.  He states feeling unfulfilled and verbalizes thoughts of being a failure. He states "having to focus on self and pain" instead of doing things and enjoying life with his family like he had planned prior to job loss.  He also verbalizes fears of wife possibly being frustrated with him as he is not the energetic person he used to be. He has continued to listen to music, sit on the porch,  and go for a walk daily but not doing much else during the day.    SI/HI .Nowithout intent/plan  Therapist Response:  reviewed symptoms, discussed stressors, facilitated expression of thoughts  and feelings, validated feelings, assisted patient began to identify effects of job loss including other losses, began to discuss grieving non death losses, discussed rationale for and developed plan with pt to begin jounaling daily to express his thoughts and feelings, discussed doing reassessment next session   Diagnosis: Axis I: MDD, single episode, severe    Axis II: No diagnosis  Collaboration of Care:  Psychiatrist AEB patient working with psychiatrist Dr. Vanetta Shawl  Patient/Guardian was advised Release of Information must be obtained prior to any record release in order to collaborate their care with an outside provider. Patient/Guardian was advised if they have not already done so to contact the registration department to sign all necessary forms in order for [place] to release information regarding their care.   Consent: Patient/Guardian gives verbal consent for treatment and assignment of benefits for services provided during this visit. Patient/Guardian expressed understanding and agreed to proceed.   [name], LCSW 02/14/2023

## 2023-02-20 ENCOUNTER — Other Ambulatory Visit: Payer: Self-pay

## 2023-02-20 ENCOUNTER — Ambulatory Visit
Admission: EM | Admit: 2023-02-20 | Discharge: 2023-02-20 | Disposition: A | Discharge status: Home / Self Care | Payer: Commercial Managed Care - PPO | Attending: Nurse Practitioner | Admitting: Nurse Practitioner

## 2023-02-20 ENCOUNTER — Encounter: Payer: Self-pay | Admitting: Emergency Medicine

## 2023-02-20 DIAGNOSIS — N3000 Acute cystitis without hematuria: Secondary | ICD-10-CM

## 2023-02-20 LAB — POCT URINALYSIS DIP (MANUAL ENTRY)
Bilirubin, UA: NEGATIVE
Glucose, UA: NEGATIVE mg/dL
Ketones, POC UA: NEGATIVE mg/dL
Nitrite, UA: POSITIVE — AB
Protein Ur, POC: 100 mg/dL — AB
Spec Grav, UA: >=1.03 — AB (ref 1.010–1.025)
Urobilinogen, UA: 0.2 U/dL
pH, UA: 6.5 (ref 5.0–8.0)

## 2023-02-20 MED ORDER — SULFAMETHOXAZOLE-TRIMETHOPRIM 800-160 MG PO TABS: 1.0000 | ORAL_TABLET | Freq: Two times a day (BID) | ORAL | 0 refills | Status: AC

## 2023-02-20 NOTE — Discharge Instructions (Signed)
The urinalysis shows you do have a urinary tract infection.  Urine culture is pending.  You will be contacted if the results of the culture show that the medication you are currently taking needs to be changed. May take over-the-counter Tylenol or ibuprofen as needed for pain, fever, or general discomfort. Increase fluids.  Try to drink at least 8-10 8 ounce glasses of water while symptoms persist. Make sure you are developing a toileting schedule that will allow you to urinate every 2 hours. Avoid caffeine such as tea, soda, coffee while symptoms persist. If you experience worsening urinary symptoms with new symptoms of fever, chills, pain around your kidneys, or other concerns, please go to the emergency department immediately for further evaluation. As discussed, I would like for you to follow-up with your primary care physician to discuss referral to urology. Follow-up as needed.

## 2023-02-20 NOTE — ED Provider Notes (Signed)
[facility]    CSN: [id number] Arrival date & time: 02/20/23  1535      History   Chief Complaint Chief Complaint  Patient presents with   Dysuria    HPI Tison Leibold is a 47 y.o. male.   The history is provided by the patient.   Patient presents for complaints of pain with urination, pelvic pressure, and feeling like his bladder is not fully emptied for the past 2 days.  Patient denies fever, chills, chest pain, abdominal pain, nausea, vomiting, diarrhea, flank pain, decreased urine stream, low back pain, hematuria, or penile discharge.  Patient is wondering if this may be due to the recent switch in lubrication products.  He reports that he is married, has no concern for STI/STD.  Patient reports his last UTI was in January of this year.  Denies prior history of kidney stones, prostate issues, or kidney infection.  Per review of the chart, patient's most recent PSA was in November 2023, which was normal.  Past Medical History:  Diagnosis Date   Allergy    Anxiety    Carpal tunnel syndrome 2018   Depression    Depression    Phreesia 05/15/2020   Eczema    Elevated LFTs    Heart murmur    Insomnia    Seasonal allergies    Vitamin D deficiency     Patient Active Problem List   Diagnosis Date Noted   Vertigo 02/12/2022   Fatigue 09/02/2021   Intractable migraine without aura and with status migrainosus 07/16/2020   Migraine 07/11/2020   Infection of toenail 03/15/2020   Tinea versicolor 03/15/2020   Prediabetes 01/31/2020   Dyslipidemia, goal LDL below 100 06/14/2019   Right lumbar radiculopathy 05/31/2019   Elevated LFTs 05/31/2019   Current moderate episode of major depressive disorder without prior episode (HCC) 05/12/2019   Discolored nails 11/01/2018   Anxiety 01/29/2018   Depression, major, single episode, severe (HCC) 01/29/2018   Vitamin D deficiency 01/29/2018   Acne vulgaris 01/15/2018   Seborrheic dermatitis 01/15/2018   Traumatic  injury to skin or subcutaneous tissue 01/15/2018   Bilateral carpal tunnel syndrome 01/14/2018   Insomnia 07/11/2017   Chronic hand pain 07/10/2017   Encounter for annual physical exam 02/21/2016   Overweight (BMI 25.0-29.9) 07/03/2013   IGT (impaired glucose tolerance) 10/31/2012   Perennial allergic rhinitis 10/19/2012   Intrinsic atopic dermatitis 10/19/2012    Past Surgical History:  Procedure Laterality Date   COLONOSCOPY     2024   NO PAST SURGERIES         Home Medications    Prior to Admission medications   Medication Sig Start Date End Date Taking? Authorizing Provider  azelastine (ASTELIN) 0.1 % nasal spray Place 1 spray into both nostrils 2 (two) times daily. Use in each nostril as directed 05/10/22 11/21/22  Alfonse Spruce, MD  Brexpiprazole (REXULTI) 3 MG TABS Take 1 tablet (3 mg total) by mouth daily. 12/04/22   [name], MD  DULoxetine (CYMBALTA) 60 MG capsule Take 1 capsule (60 mg total) by mouth daily. 12/27/22   [name], MD  fluticasone (FLONASE) 50 MCG/ACT nasal spray Place 1 spray into both nostrils daily. 05/10/22   Alfonse Spruce, MD  gabapentin (NEURONTIN) 100 MG capsule Take 1 capsule (100 mg total) by mouth 2 (two) times daily AND 2 capsules (200 mg total) at bedtime. Patient taking differently: 100 mg daily, 300 mg at night 12/04/22 03/05/23  [name], MD  hydrocortisone 2.5 % cream APPLY TO FACE 2 TIMES DAILY FOR 3 WEEKS, THEN DAILY FOR 3 WEEKS, THEN EVERY OTHER DAY FOR 3 WEEKS THEN STOP. REPEAT AS NEEDED. 05/03/21     ketoconazole (NIZORAL) 2 % shampoo Apply to face twice weekly and back daily x 1 wk, then twice weekly. 09/28/20     levocetirizine (XYZAL) 5 MG tablet Take 1 tablet (5 mg total) by mouth every evening. 05/10/22 11/21/22  Alfonse Spruce, MD  loratadine (CLARITIN) 10 MG tablet Take 10 mg by mouth daily as needed. 10/19/12   [provider]  meclizine (ANTIVERT) 25 MG tablet Take 1 tablet (25 mg total) by  mouth 3 (three) times daily as needed for dizziness. 02/12/22   Gilmore Laroche, FNP  mirtazapine (REMERON) 30 MG tablet Take 1 tablet (30 mg total) by mouth at bedtime. 09/02/22 03/01/23  [name], MD  NONFORMULARY OR COMPOUNDED ITEM Terbinafine 3% Fluconazole 2% Tea Tree Oil 5% Urea 10% Ibuprofen 2% in DMSO Suspension #53ml Apply to affected nails once at bedtime or twice daily 09/10/22   Vivi Barrack, DPM  topiramate (TOPAMAX) 100 MG tablet Take 1 tablet (100 mg total) by mouth 2 (two) times daily. 10/21/22   Kerri Perches, MD  tretinoin (RETIN-A) 0.025 % cream Apply 1 application topically at bedtime. 10/13/18   [provider]  Vitamin D, Ergocalciferol, (DRISDOL) 1.25 MG (50000 UNIT) CAPS capsule Take 1 capsule (50,000 Units total) by mouth every 7 (seven) days. 09/04/22   Kerri Perches, MD    Family History Family History  Problem Relation Age of Onset   Allergic rhinitis Mother    Hypertension Mother    Colon polyps Father    Diabetes Father    Hypertension Father    Hypertension Maternal Grandfather    Diabetes Maternal Grandfather    Cancer Paternal Grandmother        type unknown   Hypertension Paternal Grandmother    Cancer Paternal Grandfather        type unknown   Hypertension Paternal Grandfather    Colon cancer Neg Hx    Esophageal cancer Neg Hx    Rectal cancer Neg Hx    Stomach cancer Neg Hx     Social History Social History   Tobacco Use   Smoking status: Never    Passive exposure: Never   Smokeless tobacco: Never  Vaping Use   Vaping status: Never Used  Substance Use Topics   Alcohol use: No    Alcohol/week: 0.0 standard drinks of alcohol   Drug use: No     Allergies   Patient has no known allergies.   Review of Systems Review of Systems Per HPI  Physical Exam Triage Vital Signs ED Triage Vitals  Encounter Vitals Group     BP 02/20/23 1549 131/85     Systolic BP Percentile --      Diastolic BP Percentile --       Pulse Rate 02/20/23 1549 70     Resp 02/20/23 1549 20     Temp 02/20/23 1549 98.6 F (37 C)     Temp Source 02/20/23 1549 Oral     SpO2 02/20/23 1549 98 %     Weight --      Height --      Head Circumference --      Peak Flow --      Pain Score 02/20/23 1547 0     Pain Loc --  Pain Education --      Exclude from Growth Chart --    No data found.  Updated Vital Signs BP 131/85 (BP Location: Right Arm)   Pulse 70   Temp 98.6 F (37 C) (Oral)   Resp 20   SpO2 98%   Visual Acuity Right Eye Distance:   Left Eye Distance:   Bilateral Distance:    Right Eye Near:   Left Eye Near:    Bilateral Near:     Physical Exam Vitals and nursing note reviewed.  Constitutional:      General: He is not in acute distress.    Appearance: Normal appearance.  HENT:     Head: Normocephalic.  Eyes:     Extraocular Movements: Extraocular movements intact.     Pupils: Pupils are equal, round, and reactive to light.  Cardiovascular:     Rate and Rhythm: Normal rate and regular rhythm.     Pulses: Normal pulses.     Heart sounds: Normal heart sounds.  Pulmonary:     Effort: Pulmonary effort is normal. No respiratory distress.     Breath sounds: Normal breath sounds. No stridor. No wheezing, rhonchi or rales.  Abdominal:     General: Bowel sounds are normal.     Palpations: Abdomen is soft.     Tenderness: There is abdominal tenderness in the suprapubic area. There is no right CVA tenderness or left CVA tenderness.  Musculoskeletal:     Cervical back: Normal range of motion.  Lymphadenopathy:     Cervical: No cervical adenopathy.  Skin:    General: Skin is warm and dry.  Neurological:     General: No focal deficit present.     Mental Status: He is alert and oriented to person, place, and time.  Psychiatric:        Mood and Affect: Mood normal.        Behavior: Behavior normal.      UC Treatments / Results  Labs (all labs ordered are listed, but only abnormal results are  displayed) Labs Reviewed  POCT URINALYSIS DIP (MANUAL ENTRY) - Abnormal; Notable for the following components:      Result Value   Clarity, UA cloudy (*)    Spec Grav, UA >=1.030 (*)    Blood, UA moderate (*)    Protein Ur, POC =100 (*)    Nitrite, UA Positive (*)    Leukocytes, UA Moderate (2+) (*)    All other components within normal limits    EKG   Radiology No results found.  Procedures Procedures (including critical care time)  Medications Ordered in UC Medications - No data to display  Initial Impression / Assessment and Plan / UC Course  I have reviewed the triage vital signs and the nursing notes.  Pertinent labs & imaging results that were available during my care of the patient were reviewed by me and considered in my medical decision making (see chart for details).  The patient is well-appearing, he is in no acute distress, vital signs are stable.  Urinalysis is positive for UTI.  Urine culture is pending.  Will treat empirically with Bactrim DS 800/160 mg tablets.  Supportive care conditions were provided and discussed with the patient to include increasing his fluid intake, developing a toileting schedule, and avoiding caffeine.  Discussion with patient regarding recurrence of UTI and how he will need to follow-up with urology. Patient was also given strict ER follow-up precautions.  Patient states that he does plan  to follow-up with his PCP.  Patient was in agreement with this plan of care and verbalizes understanding.  All questions were answered.  Patient stable for discharge.  Final Clinical Impressions(s) / UC Diagnoses   Final diagnoses:  None   Discharge Instructions   None    ED Prescriptions   None    PDMP not reviewed this encounter.   Abran Cantor, NP 02/20/23 [phone]

## 2023-02-20 NOTE — ED Triage Notes (Addendum)
Pt reports dysuria and "not emptying bladder fully" x2 days. Pt denies any abd pain, nausea, fevers. Pt reports recently switched lubrication products around the time symptoms started. Pt reports has one partner and is married.

## 2023-02-22 LAB — URINE CULTURE: Culture: >=100000 — AB

## 2023-02-26 ENCOUNTER — Other Ambulatory Visit: Payer: Self-pay | Admitting: Psychiatry

## 2023-02-26 ENCOUNTER — Other Ambulatory Visit (HOSPITAL_COMMUNITY): Payer: Self-pay

## 2023-02-26 MED ORDER — GABAPENTIN 100 MG PO CAPS
ORAL_CAPSULE | ORAL | 0 refills | Status: DC
  Filled 2023-02-26: qty 360, 90d supply, fill #0

## 2023-02-27 ENCOUNTER — Other Ambulatory Visit: Payer: Self-pay

## 2023-03-07 NOTE — Progress Notes (Signed)
Virtual Visit via Video Note  I connected with Gabriel [name] on 03/14/23 at  8:30 AM EDT by a video enabled telemedicine application and verified that I am speaking with the correct person using two identifiers.  Location: Patient: home Provider: office Persons participated in the visit- patient, provider    I discussed the limitations of evaluation and management by telemedicine and the availability of in person appointments. The patient expressed understanding and agreed to proceed.     I discussed the assessment and treatment plan with the patient. The patient was provided an opportunity to ask questions and all were answered. The patient agreed with the plan and demonstrated an understanding of the instructions.   The patient was advised to call back or seek an in-person evaluation if the symptoms worsen or if the condition fails to improve as anticipated.  I provided 25 minutes of non-face-to-face time during this encounter.   [name], MD    [facility] MD/PA/NP OP Progress Note  03/14/2023 9:02 AM Gabriel [name]  MRN:  [id number]  Chief Complaint:  Chief Complaint  Patient presents with   Follow-up   HPI:  This is a follow-up appointment for depression, anxiety and insomnia.  He states that he has been doing okay, nothing is new.  He has been taking a walk.  He has to be able to take out the trash consistently.  It has been good.  He is also not in the room as much compared to before.  He has some communication with his children, and spends time together for dinner.  He does not think he feels depressed.  However, he can get irritable especially when he experiences pain.  He just wants to be alone.  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  Although moving helps, he cannot do rigorous exercise.  He continues to have middle insomnia.  He is unsure whether this is due to pain or anxiety.  He does  not have particular thought when he wakes up in the morning.  He feels drowsy during the day.  Although he may doze off, it does not happen so often.  He drinks that he sometimes during the lunch time, and sometimes at dinner.  He agrees to avoid taking caffeine later in the day.  He denies SI.  He agrees with the medication adjustment as outlined below.   Visit Diagnosis:    ICD-10-CM   1. MDD (major depressive disorder), recurrent, in partial remission (HCC)  F33.41     2. Anxiety  F41.9     3. Insomnia, unspecified type  G47.00       Past Psychiatric History: Please see initial evaluation for full details. I have reviewed the history. No updates at this time.     Past Medical History:  Past Medical History:  Diagnosis Date   Allergy    Anxiety    Carpal tunnel syndrome 2018   Depression    Depression    Phreesia 05/15/2020   Eczema    Elevated LFTs    Heart murmur    Insomnia    Seasonal allergies    Vitamin D deficiency     Past Surgical History:  Procedure Laterality Date   COLONOSCOPY     2024   NO PAST SURGERIES      Family Psychiatric History: Please see initial evaluation for full details. I have reviewed the history. No updates at this time.  Family History:  Family History  Problem Relation Age of Onset   Allergic rhinitis Mother    Hypertension Mother    Colon polyps Father    Diabetes Father    Hypertension Father    Hypertension Maternal Grandfather    Diabetes Maternal Grandfather    Cancer Paternal Grandmother        type unknown   Hypertension Paternal Grandmother    Cancer Paternal Grandfather        type unknown   Hypertension Paternal Grandfather    Colon cancer Neg Hx    Esophageal cancer Neg Hx    Rectal cancer Neg Hx    Stomach cancer Neg Hx     Social History:  Social History   Socioeconomic History   Marital status: Married    Spouse name: Not on file   Number of children: 2   Years of education: 12   Highest education  level: High school graduate  Occupational History   Occupation: not employed  Tobacco Use   Smoking status: Never    Passive exposure: Never   Smokeless tobacco: Never  Vaping Use   Vaping status: Never Used  Substance and Sexual Activity   Alcohol use: No    Alcohol/week: 0.0 standard drinks of alcohol   Drug use: No   Sexual activity: Yes    Birth control/protection: None  Other Topics Concern   Not on file  Social History Narrative   He lives with wife and two children.   Unemployed at this present time   Right handed   One story home   Highest level of education:  12th grade   Social Determinants of Health   Financial Resource Strain: Medium Risk (09/08/2017)   Overall Financial Resource Strain (CARDIA)    Difficulty of Paying Living Expenses: Somewhat hard  Food Insecurity: No Food Insecurity (09/08/2017)   Hunger Vital Sign    Worried About Running Out of Food in the Last Year: Never true    Ran Out of Food in the Last Year: Never true  Transportation Needs: No Transportation Needs (09/08/2017)   PRAPARE - Administrator, Civil Service (Medical): No    Lack of Transportation (Non-Medical): No  Physical Activity: Not on file  Stress: Stress Concern Present (09/08/2017)   [employer] of Occupational Health - Occupational Stress Questionnaire    Feeling of Stress : Very much  Social Connections: Not on file    Allergies: No Known Allergies  Metabolic Disorder Labs: Lab Results  Component Value Date   HGBA1C 6.3 (H) 09/03/2022   MPG 137 05/27/2019   MPG 137 12/29/2018   No results found for: "PROLACTIN" Lab Results  Component Value Date   CHOL 155 09/03/2022   TRIG 64 09/03/2022   HDL 45 09/03/2022   CHOLHDL 3.4 09/03/2022   VLDL 10 12/04/2016   LDLCALC 97 09/03/2022   LDLCALC 113 (H) 04/02/2022   Lab Results  Component Value Date   TSH 1.440 09/03/2022   TSH 1.790 09/17/2021    Therapeutic Level Labs: No results found for:  "LITHIUM" No results found for: "VALPROATE" No results found for: "CBMZ"  Current Medications: Current Outpatient Medications  Medication Sig Dispense Refill   azelastine (ASTELIN) 0.1 % nasal spray Place 1 spray into both nostrils 2 (two) times daily. Use in each nostril as directed 30 mL 5   Brexpiprazole (REXULTI) 3 MG TABS Take 1 tablet (3 mg total) by mouth daily. 90 tablet 1  DULoxetine (CYMBALTA) 60 MG capsule Take 1 capsule (60 mg total) by mouth daily. 90 capsule 0   fluticasone (FLONASE) 50 MCG/ACT nasal spray Place 1 spray into both nostrils daily. 16 g 5   gabapentin (NEURONTIN) 100 MG capsule Take 1 capsule (100 mg total) by mouth daily AND 3 capsules (300 mg total) at bedtime. (Patient not taking: Reported on 03/14/2023) 360 capsule 0   hydrocortisone 2.5 % cream APPLY TO FACE 2 TIMES DAILY FOR 3 WEEKS, THEN DAILY FOR 3 WEEKS, THEN EVERY OTHER DAY FOR 3 WEEKS THEN STOP. REPEAT AS NEEDED. 30 g 3   ketoconazole (NIZORAL) 2 % shampoo Apply to face twice weekly and back daily x 1 wk, then twice weekly. 120 mL 11   levocetirizine (XYZAL) 5 MG tablet Take 1 tablet (5 mg total) by mouth every evening. 90 tablet 2   loratadine (CLARITIN) 10 MG tablet Take 10 mg by mouth daily as needed.     meclizine (ANTIVERT) 25 MG tablet Take 1 tablet (25 mg total) by mouth 3 (three) times daily as needed for dizziness. 30 tablet 1   mirtazapine (REMERON) 30 MG tablet Take 1 tablet (30 mg total) by mouth at bedtime. 90 tablet 1   NONFORMULARY OR COMPOUNDED ITEM Terbinafine 3% Fluconazole 2% Tea Tree Oil 5% Urea 10% Ibuprofen 2% in DMSO Suspension #15ml Apply to affected nails once at bedtime or twice daily     topiramate (TOPAMAX) 100 MG tablet Take 1 tablet (100 mg total) by mouth 2 (two) times daily. 180 tablet 1   tretinoin (RETIN-A) 0.025 % cream Apply 1 application topically at bedtime.     Vitamin D, Ergocalciferol, (DRISDOL) 1.25 MG (50000 UNIT) CAPS capsule Take 1 capsule (50,000 Units total)  by mouth every 7 (seven) days. 12 capsule 1   No current facility-administered medications for this visit.     Musculoskeletal: Strength & Muscle Tone:  N/A Gait & Station:  N/A Patient leans: N/A  Psychiatric Specialty Exam: Review of Systems  Psychiatric/Behavioral:  Positive for sleep disturbance. Negative for agitation, behavioral problems, confusion, decreased concentration, dysphoric mood, hallucinations, self-injury and suicidal ideas. The patient is nervous/anxious. The patient is not hyperactive.   All other systems reviewed and are negative.   There were no vitals taken for this visit.There is no height or weight on file to calculate BMI.  General Appearance: Well Groomed  Eye Contact:  Good  Speech:  Clear and Coherent  Volume:  Normal  Mood:   same  Affect:  Appropriate, Congruent, and slightly tense, but calm  Thought Process:  Coherent  Orientation:  Full (Time, Place, and Person)  Thought Content: Logical   Suicidal Thoughts:  No  Homicidal Thoughts:  No  Memory:  Immediate;   Good  Judgement:  Good  Insight:  Good  Psychomotor Activity:  Normal  Concentration:  Concentration: Good and Attention Span: Good  Recall:  Good  Fund of Knowledge: Good  Language: Good  Akathisia:  No  Handed:  Right  AIMS (if indicated): not done  Assets:  Communication Skills Desire for Improvement  ADL's:  Intact  Cognition: WNL  Sleep:  Poor   Screenings: GAD-7    Advertising copywriter from 01/31/2023 in [facility] [place] Counselor from 10/25/2022 in [facility] [place] Counselor from 08/01/2022 in [facility] [place] Counselor from 06/05/2022 in [facility] at [place] Counselor from 05/08/2022 in [facility]  Total GAD-7 Score 7 10 7 7 7       PHQ2-9    Flowsheet Row Office Visit from  09/04/2022 in [facility] Office Visit from 04/03/2022 in [facility] Office Visit from 02/12/2022 in [facility] Counselor from 01/30/2022 in [facility] at [place] Counselor from 12/20/2021 in [facility] at [facility] Total Score 2 2 2 2 2   PHQ-9 Total Score 9 11 10 9 10       Flowsheet Row ED from 02/20/2023 in [facility] ED from 05/24/2022 in [facility] from 12[facility]20/2021 in [facility] at [place]  C-SSRS RISK CATEGORY No Risk No Risk No Risk        Assessment and Plan:  Kasten Leveque is a 47 y.o. year old male with a history of depression, bilateral carpel tunnel syndrome, who presents for follow up appointment for below.    1. MDD (major depressive disorder), recurrent, in partial remission (HCC) 2. Anxiety Acute stressors include:  Other stressors include: unemployment, carpel tunnel syndrome, lower back pain, headache, loss of his maternal aunt    History:  not interested in TMS  Although there has been steady improvement in depressive symptoms, he continues to experience anxiety since the last visit.  Will try switching from gabapentin to pregabalin to see if it is more effective with less oversedation given he reports drowsiness from gabapentin, although this medication has been very effective for anxiety and pain.  Discussed potential risk of drowsiness.  Will continue current dose of duloxetine, mirtazapine, rexulti to target depression.   3. Insomnia, unspecified type - sleep study was not consistent with sleep apnea in 2023     He continues to experience middle insomnia, although he is unsure whether it is attributable to pain and/or anxiety. Will make an intervention as outlned above.     Plan  Continue duloxetine 60 mg daily (limited benefit  from higher dose)  Continue mirtazapine 30 mg at night  Continue Rexulti 3 mg at night - monitor drowsiness  Start pregabalin 25 mg twice a day. Start from morning dose  Discontinue gabapentin- previously on 100 mg, 300 mg at night  Next appointment: 12/18 at 2 30 am for 30 mins , video [email or web address].  - on tramadol   - on topiramate for migraine   Past trials of medication: sertraline, fluoxetine, venlafaxine, mirtazapine, nortriptyline for carpel tunnel syndrome, bupropion (drowsiness), quetiapine (drowsiness), Abilify ("off balance"), Trazodone, Ambien (limited benefit), lorazepam (worsening in anxiety, drowsiness) , temazepam, Lunesta       Collaboration of Care: Collaboration of Care: Other reviewed notes in Epic  Patient/Guardian was advised Release of Information must be obtained prior to any record release in order to collaborate their care with an outside provider. Patient/Guardian was advised if they have not already done so to contact the registration department to sign all necessary forms in order for [place] to release information regarding their care.   Consent: Patient/Guardian gives verbal consent for treatment and assignment of benefits for services provided during this visit. Patient/Guardian expressed understanding and agreed to proceed.    [name], MD 03/14/2023, 9:02 AM

## 2023-03-14 ENCOUNTER — Other Ambulatory Visit (HOSPITAL_COMMUNITY): Payer: Self-pay

## 2023-03-14 ENCOUNTER — Encounter: Payer: Self-pay | Admitting: Psychiatry

## 2023-03-14 ENCOUNTER — Telehealth (INDEPENDENT_AMBULATORY_CARE_PROVIDER_SITE_OTHER): Payer: Commercial Managed Care - PPO | Admitting: Psychiatry

## 2023-03-14 DIAGNOSIS — F3341 Major depressive disorder, recurrent, in partial remission: Secondary | ICD-10-CM | POA: Diagnosis not present

## 2023-03-14 DIAGNOSIS — F419 Anxiety disorder, unspecified: Secondary | ICD-10-CM

## 2023-03-14 DIAGNOSIS — G4700 Insomnia, unspecified: Secondary | ICD-10-CM

## 2023-03-14 MED ORDER — PREGABALIN 25 MG PO CAPS
25.0000 mg | ORAL_CAPSULE | Freq: Two times a day (BID) | ORAL | 1 refills | Status: DC
Start: 2023-03-14 — End: 2023-06-18
  Filled 2023-03-14: qty 60, 30d supply, fill #0

## 2023-03-14 NOTE — Patient Instructions (Addendum)
Continue duloxetine 60 mg daily  Continue mirtazapine 30 mg at night  Continue Rexulti 3 mg at night  Start pregabalin 25 mg twice a day.  Discontinue gabapentin Next appointment: 12/18 at 2 30 pm

## 2023-03-24 ENCOUNTER — Ambulatory Visit (INDEPENDENT_AMBULATORY_CARE_PROVIDER_SITE_OTHER): Payer: Commercial Managed Care - PPO | Admitting: Psychiatry

## 2023-03-24 DIAGNOSIS — F3341 Major depressive disorder, recurrent, in partial remission: Secondary | ICD-10-CM | POA: Diagnosis not present
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
## 2023-04-07 ENCOUNTER — Ambulatory Visit (HOSPITAL_COMMUNITY): Payer: Commercial Managed Care - PPO | Admitting: Psychiatry

## 2023-04-07 DIAGNOSIS — F3341 Major depressive disorder, recurrent, in partial remission: Secondary | ICD-10-CM | POA: Diagnosis not present

## 2023-04-07 NOTE — Progress Notes (Signed)
Virtual Visit via Video Note  I connected with Gabriel [name] on 04/07/23 at 11:10 AM EST by a video enabled telemedicine application and verified that I am speaking with the correct person using two identifiers.  Location: Patient: Home Provider: [facility] [place] [facility]    I discussed the limitations of evaluation and management by telemedicine and the availability of in person appointments. The patient expressed understanding and agreed to proceed.  I provided 50 minutes of non-face-to-face time during this encounter.   [name], LCSW        THERAPIST PROGRESS NOTE    Session Time:  Monday 04/07/2023 11:10 AM - 12:00 PM   Participation Level: Active  Treatment Goals addressed:   Improve ability to manage stress and anxiety AEB reducing episodes of anxiety (worry & nervousness) from 4 x per week to 1 x per week for 60 days per pt's self-report . Increase social interaction beyond immediate family to 3-4 x per week per pt's report.  Report a decrease in anxiety symptoms as evidenced by an overall reduction in anxiety score by a minimum of 25% on the Generalized Anxiety Disorder Scale for 2 months.     Progress in treatment:  progressing    Interventions: CBT and Supportive  Summary: Gabriel [name] is a 47 y.o. male who is referred for services by PCP Dr. Syliva Overman due to patient experiencing symptoms of depression. He denies any psychiatric hospitalizations, He saw therapist Dorann Lodge in [place] for a few months. Patient reports symptoms began when he lost his job about a year ago. Per his report, he was fired after he complained about race discrimination. 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 He states now feeling as though he failed his family. He states not wanting to be around anyone and having difficulty engaging  with his wife and children. He fears losing his family.  He reports ruminating thoughts about job and the past, excessive worry depressed mood, tearfulness, anxiety, isolative behaviors, poor motivation, poor concentration, irritability, sleep difficulty, thoughts and feelings of hopelessness and worthlessness. He denies any suicidal ideations.  Patient last was seen via virtual visit about 2-3 weeks ago. He reports increased stress and symptoms of anxiety including worry, heavy breathing, insomnia, heart beating fast, nervousness, and restlessness since last session.  He reports this was triggered by a commercial where a politician talked about people with mental l health issues just need to get out and work.  This triggered fear and thoughts he may lose his disability income and be unable to support his family.  This also triggered thoughts about when he lost his job and had to inform his wife.  Patient reports also beginning to have more negative thoughts about his about self and the way he has been treated due to his ethnicity/race.  He still reports trust issues.  SI/HI .Nowithout intent/plan  Therapist Response:  reviewed symptoms, discussed stressors, facilitated expression of thoughts and feelings, validated feelings, assisted patient began to identify catastrophizing thoughts and replace with more helpful thoughts, provided psychoeducation on anxiety and the stress response, discussed rationale for and provided instructions on how to practice progressive muscle relaxation to cope with stress and anxiety, checked out interactive audio activity to patient, developed plan with patient to practice daily, facilitated patient sharing more information about childhood and previous  experiences related to ethnic stress, began to assist patient identify possible effects on current functioning   Diagnosis: Axis I: MDD, single episode, severe    Axis II: No diagnosis  Collaboration of Care: Psychiatrist AEB  patient working with psychiatrist Dr. Vanetta Shawl  Patient/Guardian was advised Release of Information must be obtained prior to any record release in order to collaborate their care with an outside provider. Patient/Guardian was advised if they have not already done so to contact the registration department to sign all necessary forms in order for [place] to release information regarding their care.   Consent: Patient/Guardian gives verbal consent for treatment and assignment of benefits for services provided during this visit. Patient/Guardian expressed understanding and agreed to proceed.   [name], LCSW 04/07/2023

## 2023-04-08 ENCOUNTER — Other Ambulatory Visit (HOSPITAL_COMMUNITY): Payer: Self-pay

## 2023-04-08 ENCOUNTER — Ambulatory Visit (INDEPENDENT_AMBULATORY_CARE_PROVIDER_SITE_OTHER): Payer: Commercial Managed Care - PPO | Admitting: Family Medicine

## 2023-04-08 ENCOUNTER — Encounter: Payer: Self-pay | Admitting: Family Medicine

## 2023-04-08 VITALS — BP 114/78 | HR 71 | Ht 72.0 in | Wt 202.0 lb

## 2023-04-08 DIAGNOSIS — R7302 Impaired glucose tolerance (oral): Secondary | ICD-10-CM

## 2023-04-08 DIAGNOSIS — E785 Hyperlipidemia, unspecified: Secondary | ICD-10-CM | POA: Diagnosis not present

## 2023-04-08 DIAGNOSIS — E663 Overweight: Secondary | ICD-10-CM | POA: Diagnosis not present

## 2023-04-08 DIAGNOSIS — N390 Urinary tract infection, site not specified: Secondary | ICD-10-CM | POA: Insufficient documentation

## 2023-04-08 DIAGNOSIS — R42 Dizziness and giddiness: Secondary | ICD-10-CM

## 2023-04-08 DIAGNOSIS — R109 Unspecified abdominal pain: Secondary | ICD-10-CM

## 2023-04-08 DIAGNOSIS — G43011 Migraine without aura, intractable, with status migrainosus: Secondary | ICD-10-CM | POA: Diagnosis not present

## 2023-04-08 DIAGNOSIS — E559 Vitamin D deficiency, unspecified: Secondary | ICD-10-CM

## 2023-04-08 DIAGNOSIS — Z0001 Encounter for general adult medical examination with abnormal findings: Secondary | ICD-10-CM | POA: Diagnosis not present

## 2023-04-08 DIAGNOSIS — Z0000 Encounter for general adult medical examination without abnormal findings: Secondary | ICD-10-CM

## 2023-04-08 DIAGNOSIS — G8929 Other chronic pain: Secondary | ICD-10-CM | POA: Insufficient documentation

## 2023-04-08 DIAGNOSIS — Z8042 Family history of malignant neoplasm of prostate: Secondary | ICD-10-CM | POA: Insufficient documentation

## 2023-04-08 DIAGNOSIS — Z23 Encounter for immunization: Secondary | ICD-10-CM | POA: Insufficient documentation

## 2023-04-08 MED ORDER — MECLIZINE HCL 25 MG PO TABS
25.0000 mg | ORAL_TABLET | Freq: Three times a day (TID) | ORAL | 1 refills | Status: DC | PRN
Start: 2023-04-08 — End: 2024-01-21
  Filled 2023-04-08: qty 30, 10d supply, fill #0
  Filled 2023-09-08: qty 30, 10d supply, fill #1

## 2023-04-08 MED ORDER — TOPIRAMATE 100 MG PO TABS
100.0000 mg | ORAL_TABLET | Freq: Two times a day (BID) | ORAL | 1 refills | Status: DC
  Filled 2023-04-08 – 2023-10-09 (×2): qty 180, 90d supply, fill #0

## 2023-04-08 MED ORDER — KETOCONAZOLE 2 % EX SHAM
1.0000 | MEDICATED_SHAMPOO | CUTANEOUS | 11 refills | Status: DC
  Filled 2023-04-08: qty 120, 30d supply, fill #0
  Filled 2023-05-09: qty 120, 30d supply, fill #1
  Filled 2024-01-21: qty 120, 30d supply, fill #2

## 2023-04-08 NOTE — Patient Instructions (Addendum)
F/U in 4 months, call if you need me sooner  Flu vaccine today  Covid vaccine is recommended and is at your  pharmacy  Labs today, cBC, pSA, cmp and eGFr, hBA1C, vit D  You are referred to Urology and for [place] of your kidneys  Best to you and your family for the Season and 2025!   Thanks for choosing [facility], we consider it a privelige to serve you.

## 2023-04-08 NOTE — Progress Notes (Signed)
   Gabriel [name]     MRN: [id number]      DOB: [birth year]  Chief Complaint  Patient presents with   Annual Exam    CPE flu shot     HPI: Patient is in for annual physical exam. recurrentUTI , 2 since 2024, needs Urology eval Father  diagnosed  with prostate ca at around age 47 Intermittent right flnk pain x approx 4 months  Immunization is reviewed , and  updated   PE; BP 114/78 (BP Location: Right Arm, Patient Position: Sitting, Cuff Size: Large)   Pulse 71   Ht 6' (1.829 m)   Wt 202 lb (91.6 kg)   SpO2 97%   BMI 27.40 kg/m   Pleasant male, alert and oriented x 3, in no cardio-pulmonary distress. Afebrile. HEENT No facial trauma or asymetry. Sinuses non tender. EOMI External ears normal,  Neck: supple, no adenopathy,JVD or thyromegaly.No bruits.  Chest: Clear to ascultation bilaterally.No crackles or wheezes. Non tender to palpation  Cardiovascular system; Heart sounds normal,  S1 and  S2 ,no S3.  No murmur, or thrill. Apical beat not displaced Peripheral pulses normal.  Abdomen: Soft, no organomegaly or masses.Right flamk pain to deep palaption No bruits. Bowel sounds normal. No guarding, tenderness or rebound.    Musculoskeletal exam: Full ROM of spine, hips , shoulders and knees. No deformity ,swelling or crepitus noted. No muscle wasting or atrophy.   Neurologic: Cranial nerves 2 to 12 intact. Power, tone ,sensation and reflexes normal throughout. No disturbance in gait. No tremor.  Skin: Intact, no ulceration, erythema , scaling or rash noted. Pigmentation normal throughout  Psych; Depressed  mood and  flat affect.Anxious  Judgement and concentration normal   Assessment & Plan:  Recurrent UTI 2 documented UTI's between January 2024 and present,Refer urology  Encounter for immunization After obtaining informed consent, the vaccine is  administered , with no adverse effect noted at the time of administration.   Encounter for annual  physical exam Annual exam as documented. Immunization and cancer screening needs are specifically addressed at this visit.   Intractable migraine without aura and with status migrainosus Needs to commit to twice daily topamax, rx sent  FH: prostate cancer Refer Urology, has had normal PSA values t odate  Flank pain, chronic Obtain rnal [place] to evaluate

## 2023-04-08 NOTE — Assessment & Plan Note (Signed)
After obtaining informed consent, the vaccine is  administered , with no adverse effect noted at the time of administration.  

## 2023-04-08 NOTE — Assessment & Plan Note (Signed)
Refer Urology, has had normal PSA values t odate

## 2023-04-08 NOTE — Assessment & Plan Note (Addendum)
2 documented UTI's between January 2024 and present,Refer urology

## 2023-04-08 NOTE — Assessment & Plan Note (Signed)
Obtain rnal [place] to evaluate

## 2023-04-08 NOTE — Assessment & Plan Note (Signed)
Needs to commit to twice daily topamax, rx sent

## 2023-04-08 NOTE — Assessment & Plan Note (Signed)
Annual exam as documented. . Immunization and cancer screening needs are specifically addressed at this visit.  

## 2023-04-09 LAB — CBC
Hematocrit: 45.6 % (ref 37.5–51.0)
Hemoglobin: 15 g/dL (ref 13.0–17.7)
MCH: 29.3 pg (ref 26.6–33.0)
MCHC: 32.9 g/dL (ref 31.5–35.7)
MCV: 89 fL (ref 79–97)
Platelets: 376 10*3/uL (ref 150–450)
RBC: 5.12 x10E6/uL (ref 4.14–5.80)
RDW: 12.2 % (ref 11.6–15.4)
WBC: 3.6 10*3/uL (ref 3.4–10.8)

## 2023-04-09 LAB — CMP14+EGFR
ALT: 29 [IU]/L (ref 0–44)
AST: 27 [IU]/L (ref 0–40)
Albumin: 4.6 g/dL (ref 4.1–5.1)
Alkaline Phosphatase: 102 [IU]/L (ref 44–121)
BUN/Creatinine Ratio: 11 (ref 9–20)
BUN: 12 mg/dL (ref 6–24)
Bilirubin Total: 0.4 mg/dL (ref 0.0–1.2)
CO2: 26 mmol/L (ref 20–29)
Calcium: 9.3 mg/dL (ref 8.7–10.2)
Chloride: 103 mmol/L (ref 96–106)
Creatinine, Ser: 1.11 mg/dL (ref 0.76–1.27)
Globulin, Total: 2.4 g/dL (ref 1.5–4.5)
Glucose: 93 mg/dL (ref 70–99)
Potassium: 4.7 mmol/L (ref 3.5–5.2)
Sodium: 142 mmol/L (ref 134–144)
Total Protein: 7 g/dL (ref 6.0–8.5)
eGFR: 82 mL/min/{1.73_m2} (ref >59)

## 2023-04-09 LAB — PSA: Prostate Specific Ag, Serum: 0.8 ng/mL (ref 0.0–4.0)

## 2023-04-09 LAB — HEMOGLOBIN A1C
Est. average glucose Bld gHb Est-mCnc: 134 mg/dL
Hgb A1c MFr Bld: 6.3 % — ABNORMAL HIGH (ref 4.8–5.6)

## 2023-04-09 LAB — VITAMIN D 25 HYDROXY (VIT D DEFICIENCY, FRACTURES): Vit D, 25-Hydroxy: 45.9 ng/mL (ref 30.0–100.0)

## 2023-04-11 ENCOUNTER — Ambulatory Visit (HOSPITAL_COMMUNITY): Payer: Commercial Managed Care - PPO

## 2023-04-14 ENCOUNTER — Ambulatory Visit (HOSPITAL_COMMUNITY)
Admission: RE | Admit: 2023-04-14 | Discharge: 2023-04-14 | Disposition: A | Discharge status: Home / Self Care | Payer: Commercial Managed Care - PPO | Source: Ambulatory Visit | Attending: Family Medicine | Admitting: Family Medicine

## 2023-04-14 DIAGNOSIS — G8929 Other chronic pain: Secondary | ICD-10-CM | POA: Insufficient documentation

## 2023-04-14 DIAGNOSIS — N390 Urinary tract infection, site not specified: Secondary | ICD-10-CM | POA: Insufficient documentation

## 2023-04-14 DIAGNOSIS — R109 Unspecified abdominal pain: Secondary | ICD-10-CM | POA: Diagnosis not present

## 2023-04-14 DIAGNOSIS — N1330 Unspecified hydronephrosis: Secondary | ICD-10-CM | POA: Diagnosis not present

## 2023-04-14 DIAGNOSIS — N2889 Other specified disorders of kidney and ureter: Secondary | ICD-10-CM | POA: Diagnosis not present

## 2023-04-16 ENCOUNTER — Other Ambulatory Visit: Payer: Self-pay | Admitting: Family Medicine

## 2023-04-16 DIAGNOSIS — N2889 Other specified disorders of kidney and ureter: Secondary | ICD-10-CM

## 2023-04-18 ENCOUNTER — Ambulatory Visit (HOSPITAL_COMMUNITY)
Admission: RE | Admit: 2023-04-18 | Discharge: 2023-04-18 | Disposition: A | Discharge status: Home / Self Care | Payer: Commercial Managed Care - PPO | Source: Ambulatory Visit | Attending: Family Medicine | Admitting: Family Medicine

## 2023-04-18 DIAGNOSIS — N2889 Other specified disorders of kidney and ureter: Secondary | ICD-10-CM | POA: Diagnosis not present

## 2023-04-18 DIAGNOSIS — N281 Cyst of kidney, acquired: Secondary | ICD-10-CM | POA: Diagnosis not present

## 2023-04-18 MED ORDER — GADOBUTROL 1 MMOL/ML IV SOLN
9.0000 mL | Freq: Once | INTRAVENOUS | Status: AC | PRN
  Administered 2023-04-18: 9 mL via INTRAVENOUS

## 2023-04-23 ENCOUNTER — Ambulatory Visit: Payer: Commercial Managed Care - PPO | Admitting: Urology

## 2023-04-23 VITALS — BP 119/71 | HR 74

## 2023-04-23 DIAGNOSIS — Z87440 Personal history of urinary (tract) infections: Secondary | ICD-10-CM | POA: Diagnosis not present

## 2023-04-23 DIAGNOSIS — N2889 Other specified disorders of kidney and ureter: Secondary | ICD-10-CM | POA: Diagnosis not present

## 2023-04-23 DIAGNOSIS — N390 Urinary tract infection, site not specified: Secondary | ICD-10-CM

## 2023-04-23 LAB — URINALYSIS, ROUTINE W REFLEX MICROSCOPIC
Bilirubin, UA: NEGATIVE
Glucose, UA: NEGATIVE
Ketones, UA: NEGATIVE
Leukocytes,UA: NEGATIVE
Nitrite, UA: NEGATIVE
Protein,UA: NEGATIVE
Specific Gravity, UA: 1.025 (ref 1.005–1.030)
Urobilinogen, Ur: 0.2 mg/dL (ref 0.2–1.0)
pH, UA: 6 (ref 5.0–7.5)

## 2023-04-23 LAB — MICROSCOPIC EXAMINATION
Bacteria, UA: NONE SEEN
WBC, UA: NONE SEEN /[HPF] (ref 0–5)

## 2023-04-23 LAB — BLADDER SCAN AMB NON-IMAGING: Scan Result: 3

## 2023-04-23 NOTE — Progress Notes (Signed)
 post void residual=3

## 2023-04-23 NOTE — Progress Notes (Signed)
04/23/2023 10:32 AM   [name] 1975-06-20 [id number]  Referring provider: Kerri Perches, MD [address], [address] [place],  [place] [address]  Recurrent UTI and renal mass   HPI: Mr Plasencia is a 47yo here for evaluation of recurrent UTI and right renal mass. He has had 2 UTIs in the past year. Both have been ecoli. He does not urinate after intercourse. IPSS 5 QOL 2. NO dysuria or gross hematuria. Father was diagnosed with Prostate cancer in his 23s. Last PSA was 0.8 in 03/2023.  He was found to a 1cm right renal mass that on MRI showed to be a simple    PMH: Past Medical History:  Diagnosis Date   Allergy    Anxiety    Carpal tunnel syndrome 2018   Depression    Depression    [employer] 05/15/2020   Eczema    Elevated LFTs    Heart murmur    Insomnia    Seasonal allergies    Vitamin D deficiency     Surgical History: Past Surgical History:  Procedure Laterality Date   COLONOSCOPY     2024   NO PAST SURGERIES      Home Medications:  Allergies as of 04/23/2023   No Known Allergies      Medication List        Accurate as of April 23, 2023 10:32 AM. If you have any questions, ask your nurse or doctor.          Azelastine HCl 137 MCG/SPRAY Soln Place 1 spray into both nostrils 2 (two) times daily. Use in each nostril as directed   DULoxetine 60 MG capsule Commonly known as: CYMBALTA Take 1 capsule (60 mg total) by mouth daily.   fluticasone 50 MCG/ACT nasal spray Commonly known as: FLONASE Place 1 spray into both nostrils daily.   hydrocortisone 2.5 % cream APPLY TO FACE 2 TIMES DAILY FOR 3 WEEKS, THEN DAILY FOR 3 WEEKS, THEN EVERY OTHER DAY FOR 3 WEEKS THEN STOP. REPEAT AS NEEDED.   ketoconazole 2 % shampoo Commonly known as: NIZORAL Apply to face twice weekly and back daily for 1 week, then twice weekly thereafter.   loratadine 10 MG tablet Commonly known as: CLARITIN Take 10 mg by mouth daily as needed.   meclizine 25 MG  tablet Commonly known as: ANTIVERT Take 1 tablet (25 mg total) by mouth 3 (three) times daily as needed for dizziness.   mirtazapine 30 MG tablet Commonly known as: REMERON Take 1 tablet (30 mg total) by mouth at bedtime.   NONFORMULARY OR COMPOUNDED ITEM Terbinafine 3% Fluconazole 2% Tea Tree Oil 5% Urea 10% Ibuprofen 2% in DMSO Suspension #77ml Apply to affected nails once at bedtime or twice daily   pregabalin 25 MG capsule Commonly known as: LYRICA Take 1 capsule (25 mg total) by mouth 2 (two) times daily.   Rexulti 3 MG Tabs Generic drug: Brexpiprazole Take 1 tablet (3 mg total) by mouth daily.   topiramate 100 MG tablet Commonly known as: Topamax Take 1 tablet (100 mg total) by mouth 2 (two) times daily.   tretinoin 0.025 % cream Commonly known as: RETIN-A Apply 1 application topically at bedtime.   Vitamin D (Ergocalciferol) 1.25 MG (50000 UNIT) Caps capsule Commonly known as: DRISDOL Take 1 capsule (50,000 Units total) by mouth every 7 (seven) days.        Allergies: No Known Allergies  Family History: Family History  Problem Relation Age of Onset  Allergic rhinitis Mother    Hypertension Mother    Colon polyps Father    Diabetes Father    Hypertension Father    Hypertension Maternal Grandfather    Diabetes Maternal Grandfather    Cancer Paternal Grandmother        type unknown   Hypertension Paternal Grandmother    Cancer Paternal Grandfather        type unknown   Hypertension Paternal Grandfather    Colon cancer Neg Hx    Esophageal cancer Neg Hx    Rectal cancer Neg Hx    Stomach cancer Neg Hx     Social History:  reports that he has never smoked. He has never been exposed to tobacco smoke. He has never used smokeless tobacco. He reports that he does not drink alcohol and does not use drugs.  ROS: All other review of systems were reviewed and are negative except what is noted above in HPI  Physical Exam: BP 119/71   Pulse 74    Constitutional:  Alert and oriented, No acute distress. HEENT: [place] AT, moist mucus membranes.  Trachea midline, no masses. Cardiovascular: No clubbing, cyanosis, or edema. Respiratory: Normal respiratory effort, no increased work of breathing. GI: Abdomen is soft, nontender, nondistended, no abdominal masses GU: No CVA tenderness.  Lymph: No cervical or inguinal lymphadenopathy. Skin: No rashes, bruises or suspicious lesions. Neurologic: Grossly intact, no focal deficits, moving all 4 extremities. Psychiatric: Normal mood and affect.  Laboratory Data: Lab Results  Component Value Date   WBC 3.6 04/08/2023   HGB 15.0 04/08/2023   HCT 45.6 04/08/2023   MCV 89 04/08/2023   PLT 376 04/08/2023    Lab Results  Component Value Date   CREATININE 1.11 04/08/2023    Lab Results  Component Value Date   PSA 0.5 02/18/2018   PSA 0.3 12/04/2016    No results found for: "TESTOSTERONE"  Lab Results  Component Value Date   HGBA1C 6.3 (H) 04/08/2023    Urinalysis    Component Value Date/Time   BILIRUBINUR negative 02/20/2023 1541   KETONESUR negative 02/20/2023 1541   PROTEINUR =100 (A) 02/20/2023 1541   UROBILINOGEN 0.2 02/20/2023 1541   NITRITE Positive (A) 02/20/2023 1541   LEUKOCYTESUR Moderate (2+) (A) 02/20/2023 1541    No results found for: "LABMICR", "WBCUA", "RBCUA", "LABEPIT", "MUCUS", "BACTERIA"  Pertinent Imaging: MRI 12/6/20245: Images reviewed and discussed with the patient  No results found for this or any previous visit.  No results found for this or any previous visit.  No results found for this or any previous visit.  No results found for this or any previous visit.  Results for orders placed during the hospital encounter of 04/14/23  US Renal  Narrative CLINICAL DATA:  Recurrent urinary tract infection  EXAM: RENAL / URINARY TRACT ULTRASOUND COMPLETE  COMPARISON:  Ultrasound abdomen 06/02/2019  FINDINGS: Right Kidney:  Renal measurements:  11.9 x 5.1 x 5.3 cm = volume: 170 mL. Normal renal cortical thickness and echogenicity. There is a 1.8 x 1.6 cm hypoechoic mass within the right kidney. There is a 0.8 x 0.7 cm exophytic hypoechoic mass midpole right kidney, too small to characterize.  Left Kidney:  Renal measurements: 12.4 x 6.4 x 5.6 cm = volume: 234.0 mL. Normal renal cortical thickness and echogenicity. No mass. Mild pelviectasis.  Bladder:  Appears normal for degree of bladder distention.  Other:  None.  IMPRESSION: 1. There is a 1.8 cm hypoechoic mass within the right kidney. This  may represent a complex cyst. Recommend further evaluation with pre and post contrast-enhanced abdominal MRI. 2. Mild left pelviectasis.   Electronically Signed By: Annia Belt M.D. On: 04/14/2023 09:50  No valid procedures specified. No results found for this or any previous visit.  No results found for this or any previous visit.   Assessment & Plan:    1. Kidney mass -we discussed the benign nature of these cysts. No followup needed - Urinalysis, Routine w reflex microscopic  2. Recurrent UTI --We discussed the natural hx of recurrent UTIs and the various causes. We discussed the treatment options including post coital prophylaxis, daily prophylaxis, and behaivoral modification. We will trial post coital urination and I will see him back in 3 months - BLADDER SCAN AMB NON-IMAGING   No follow-ups on file.  Wilkie Aye, MD  [facility] [place]

## 2023-04-27 NOTE — Progress Notes (Deleted)
BH MD/PA/NP OP Progress Note  04/27/2023 9:23 AM Gabriel [name]  MRN:  [id number]  Chief Complaint: No chief complaint on file.  HPI: *** Visit Diagnosis: No diagnosis found.  Past Psychiatric History: Please see initial evaluation for full details. I have reviewed the history. No updates at this time.     Past Medical History:  Past Medical History:  Diagnosis Date   Allergy    Anxiety    Carpal tunnel syndrome 2018   Depression    Depression    Phreesia 05/15/2020   Eczema    Elevated LFTs    Heart murmur    Insomnia    Seasonal allergies    Vitamin D deficiency     Past Surgical History:  Procedure Laterality Date   COLONOSCOPY     2024   NO PAST SURGERIES      Family Psychiatric History: Please see initial evaluation for full details. I have reviewed the history. No updates at this time.     Family History:  Family History  Problem Relation Age of Onset   Allergic rhinitis Mother    Hypertension Mother    Colon polyps Father    Diabetes Father    Hypertension Father    Hypertension Maternal Grandfather    Diabetes Maternal Grandfather    Cancer Paternal Grandmother        type unknown   Hypertension Paternal Grandmother    Cancer Paternal Grandfather        type unknown   Hypertension Paternal Grandfather    Colon cancer Neg Hx    Esophageal cancer Neg Hx    Rectal cancer Neg Hx    Stomach cancer Neg Hx     Social History:  Social History   Socioeconomic History   Marital status: Married    Spouse name: Not on file   Number of children: 2   Years of education: 12   Highest education level: High school graduate  Occupational History   Occupation: not employed  Tobacco Use   Smoking status: Never    Passive exposure: Never   Smokeless tobacco: Never  Vaping Use   Vaping status: Never Used  Substance and Sexual Activity   Alcohol use: No    Alcohol/week: 0.0 standard drinks of alcohol   Drug use: No   Sexual activity: Yes    Birth  control/protection: None  Other Topics Concern   Not on file  Social History Narrative   He lives with wife and two children.   Unemployed at this present time   Right handed   One story home   Highest level of education:  12th grade   Social Drivers of Health   Financial Resource Strain: Medium Risk (09/08/2017)   Overall Financial Resource Strain (CARDIA)    Difficulty of Paying Living Expenses: Somewhat hard  Food Insecurity: No Food Insecurity (09/08/2017)   Hunger Vital Sign    Worried About Running Out of Food in the Last Year: Never true    Ran Out of Food in the Last Year: Never true  Transportation Needs: No Transportation Needs (09/08/2017)   PRAPARE - Administrator, Civil Service (Medical): No    Lack of Transportation (Non-Medical): No  Physical Activity: Not on file  Stress: Stress Concern Present (09/08/2017)   [employer] of Occupational Health - Occupational Stress Questionnaire    Feeling of Stress : Very much  Social Connections: Not on file    Allergies: No Known Allergies  Metabolic Disorder Labs: Lab Results  Component Value Date   HGBA1C 6.3 (H) 04/08/2023   MPG 137 05/27/2019   MPG 137 12/29/2018   No results found for: "PROLACTIN" Lab Results  Component Value Date   CHOL 155 09/03/2022   TRIG 64 09/03/2022   HDL 45 09/03/2022   CHOLHDL 3.4 09/03/2022   VLDL 10 12/04/2016   LDLCALC 97 09/03/2022   LDLCALC 113 (H) 04/02/2022   Lab Results  Component Value Date   TSH 1.440 09/03/2022   TSH 1.790 09/17/2021    Therapeutic Level Labs: No results found for: "LITHIUM" No results found for: "VALPROATE" No results found for: "CBMZ"  Current Medications: Current Outpatient Medications  Medication Sig Dispense Refill   azelastine (ASTELIN) 0.1 % nasal spray Place 1 spray into both nostrils 2 (two) times daily. Use in each nostril as directed 30 mL 5   Brexpiprazole (REXULTI) 3 MG TABS Take 1 tablet (3 mg total) by mouth  daily. 90 tablet 1   DULoxetine (CYMBALTA) 60 MG capsule Take 1 capsule (60 mg total) by mouth daily. 90 capsule 0   fluticasone (FLONASE) 50 MCG/ACT nasal spray Place 1 spray into both nostrils daily. 16 g 5   hydrocortisone 2.5 % cream APPLY TO FACE 2 TIMES DAILY FOR 3 WEEKS, THEN DAILY FOR 3 WEEKS, THEN EVERY OTHER DAY FOR 3 WEEKS THEN STOP. REPEAT AS NEEDED. 30 g 3   ketoconazole (NIZORAL) 2 % shampoo Apply to face twice weekly and back daily for 1 week, then twice weekly thereafter. 120 mL 11   loratadine (CLARITIN) 10 MG tablet Take 10 mg by mouth daily as needed.     meclizine (ANTIVERT) 25 MG tablet Take 1 tablet (25 mg total) by mouth 3 (three) times daily as needed for dizziness. 30 tablet 1   mirtazapine (REMERON) 30 MG tablet Take 1 tablet (30 mg total) by mouth at bedtime. 90 tablet 1   NONFORMULARY OR COMPOUNDED ITEM Terbinafine 3% Fluconazole 2% Tea Tree Oil 5% Urea 10% Ibuprofen 2% in DMSO Suspension #41ml Apply to affected nails once at bedtime or twice daily     pregabalin (LYRICA) 25 MG capsule Take 1 capsule (25 mg total) by mouth 2 (two) times daily. 60 capsule 1   topiramate (TOPAMAX) 100 MG tablet Take 1 tablet (100 mg total) by mouth 2 (two) times daily. 180 tablet 1   tretinoin (RETIN-A) 0.025 % cream Apply 1 application topically at bedtime.     Vitamin D, Ergocalciferol, (DRISDOL) 1.25 MG (50000 UNIT) CAPS capsule Take 1 capsule (50,000 Units total) by mouth every 7 (seven) days. 12 capsule 1   No current facility-administered medications for this visit.     Musculoskeletal: Strength & Muscle Tone:  N/A Gait & Station: N/A Patient leans: N/A  Psychiatric Specialty Exam: Review of Systems  There were no vitals taken for this visit.There is no height or weight on file to calculate BMI.  General Appearance: {Appearance:22683}  Eye Contact:  {BHH EYE CONTACT:22684}  Speech:  Clear and Coherent  Volume:  Normal  Mood:  {BHH MOOD:22306}  Affect:  {Affect  (PAA):22687}  Thought Process:  Coherent  Orientation:  Full (Time, Place, and Person)  Thought Content: Logical   Suicidal Thoughts:  {ST/HT (PAA):22692}  Homicidal Thoughts:  {ST/HT (PAA):22692}  Memory:  Immediate;   Good  Judgement:  {Judgement (PAA):22694}  Insight:  {Insight (PAA):22695}  Psychomotor Activity:  Normal  Concentration:  Concentration: Good and Attention Span: Good  Recall:  Good  Fund of Knowledge: Good  Language: Good  Akathisia:  No  Handed:  Right  AIMS (if indicated): not done  Assets:  Communication Skills Desire for Improvement  ADL's:  Intact  Cognition: WNL  Sleep:  {BHH GOOD/FAIR/POOR:22877}   Screenings: GAD-7    Flowsheet Row Office Visit from 04/08/2023 in [facility] Counselor from 03/24/2023 in [facility] [place] Counselor from 01/31/2023 in [facility] at [place] Counselor from 10/25/2022 in [facility] at [place] Counselor from 08/01/2022 in [facility] at [place]  Total GAD-7 Score 0 8 7 10 7       PHQ2-9    Flowsheet Row Office Visit from 04/08/2023 in [facility] Counselor from 03/24/2023 in [facility] at [place] Office Visit from 09/04/2022 in [facility] Office Visit from 04/03/2022 in [facility] Office Visit from 02/12/2022 in [facility] [place] [facility]  PHQ-2 Total Score 0 2 2 2 2   PHQ-9 Total Score -- 9 9 11 10       Flowsheet Row Counselor from 03/24/2023 in [facility] at [place] [facility] from 02/20/2023 in [facility] [place] [facility] from 05/24/2022 in [facility] [place]  C-SSRS RISK CATEGORY No Risk No Risk No Risk        Assessment and Plan:  Gabriel [name] is a 47 y.o. year old male with a history of  depression, bilateral carpel tunnel syndrome, who presents for follow up appointment for below.    1. MDD (major depressive disorder), recurrent, in partial remission (HCC) 2. Anxiety Acute stressors include:  Other stressors include: unemployment, carpel tunnel syndrome, lower back pain, headache, loss of his maternal aunt    History:  not interested in TMS  Although there has been steady improvement in depressive symptoms, he continues to experience anxiety since the last visit.  Will try switching from gabapentin to pregabalin to see if it is more effective with less oversedation given he reports drowsiness from gabapentin, although this medication has been very effective for anxiety and pain.  Discussed potential risk of drowsiness.  Will continue current dose of duloxetine, mirtazapine, rexulti to target depression.    3. Insomnia, unspecified type - sleep study was not consistent with sleep apnea in 2023     He continues to experience middle insomnia, although he is unsure whether it is attributable to pain and/or anxiety. Will make an intervention as outlned above.      Plan  Continue duloxetine 60 mg daily (limited benefit from higher dose)  Continue mirtazapine 30 mg at night  Continue Rexulti 3 mg at night - monitor drowsiness  Start pregabalin 25 mg twice a day. Start from morning dose  Discontinue gabapentin- previously on 100 mg, 300 mg at night  Next appointment: 12/18 at 2 30 am for 30 mins , video [email or web address].  - on tramadol   - on topiramate for migraine   Past trials of medication: sertraline, fluoxetine, venlafaxine, mirtazapine, nortriptyline for carpel tunnel syndrome, bupropion (drowsiness), quetiapine (drowsiness), Abilify ("off balance"), Trazodone, Ambien (limited benefit), lorazepam (worsening in anxiety, drowsiness) , temazepam, Lunesta      Collaboration of Care: Collaboration of Care: {BH OP Collaboration of Care:21014065}  Patient/Guardian was  advised Release of Information must be obtained prior to any record release in order to collaborate their care with an  outside provider. Patient/Guardian was advised if they have not already done so to contact the registration department to sign all necessary forms in order for [place] to release information regarding their care.   Consent: Patient/Guardian gives verbal consent for treatment and assignment of benefits for services provided during this visit. Patient/Guardian expressed understanding and agreed to proceed.    [name], MD 04/27/2023, 9:23 AM

## 2023-04-28 ENCOUNTER — Encounter: Payer: Self-pay | Admitting: Urology

## 2023-04-28 NOTE — Patient Instructions (Signed)
Urinary Tract Infection, Adult  A urinary tract infection (UTI) is an infection of any part of the urinary tract. The urinary tract includes the kidneys, ureters, bladder, and urethra. These organs make, store, and get rid of urine in the body. An upper UTI affects the ureters and kidneys. A lower UTI affects the bladder and urethra. What are the causes? Most urinary tract infections are caused by bacteria in your genital area around your urethra, where urine leaves your body. These bacteria grow and cause inflammation of your urinary tract. What increases the risk? You are more likely to develop this condition if: You have a urinary catheter that stays in place. You are not able to control when you urinate or have a bowel movement (incontinence). You are male and you: Use a spermicide or diaphragm for birth control. Have low estrogen levels. Are pregnant. You have certain genes that increase your risk. You are sexually active. You take antibiotic medicines. You have a condition that causes your flow of urine to slow down, such as: An enlarged prostate, if you are male. Blockage in your urethra. A kidney stone. A nerve condition that affects your bladder control (neurogenic bladder). Not getting enough to drink, or not urinating often. You have certain medical conditions, such as: Diabetes. A weak disease-fighting system (immunesystem). Sickle cell disease. Gout. Spinal cord injury. What are the signs or symptoms? Symptoms of this condition include: Needing to urinate right away (urgency). Frequent urination. This may include small amounts of urine each time you urinate. Pain or burning with urination. Blood in the urine. Urine that smells bad or unusual. Trouble urinating. Cloudy urine. Vaginal discharge, if you are male. Pain in the abdomen or the lower back. You may also have: Vomiting or a decreased appetite. Confusion. Irritability or tiredness. A fever or  chills. Diarrhea. The first symptom in older adults may be confusion. In some cases, they may not have any symptoms until the infection has worsened. How is this diagnosed? This condition is diagnosed based on your medical history and a physical exam. You may also have other tests, including: Urine tests. Blood tests. Tests for STIs (sexually transmitted infections). If you have had more than one UTI, a cystoscopy or imaging studies may be done to determine the cause of the infections. How is this treated? Treatment for this condition includes: Antibiotic medicine. Over-the-counter medicines to treat discomfort. Drinking enough water to stay hydrated. If you have frequent infections or have other conditions such as a kidney stone, you may need to see a health care provider who specializes in the urinary tract (urologist). In rare cases, urinary tract infections can cause sepsis. Sepsis is a life-threatening condition that occurs when the body responds to an infection. Sepsis is treated in the hospital with IV antibiotics, fluids, and other medicines. Follow these instructions at home:  Medicines Take over-the-counter and prescription medicines only as told by your health care provider. If you were prescribed an antibiotic medicine, take it as told by your health care provider. Do not stop using the antibiotic even if you start to feel better. General instructions Make sure you: Empty your bladder often and completely. Do not hold urine for long periods of time. Empty your bladder after sex. Wipe from front to back after urinating or having a bowel movement if you are male. Use each tissue only one time when you wipe. Drink enough fluid to keep your urine pale yellow. Keep all follow-up visits. This is important. Contact a health   care provider if: Your symptoms do not get better after 1-2 days. Your symptoms go away and then return. Get help right away if: You have severe pain in  your back or your lower abdomen. You have a fever or chills. You have nausea or vomiting. Summary A urinary tract infection (UTI) is an infection of any part of the urinary tract, which includes the kidneys, ureters, bladder, and urethra. Most urinary tract infections are caused by bacteria in your genital area. Treatment for this condition often includes antibiotic medicines. If you were prescribed an antibiotic medicine, take it as told by your health care provider. Do not stop using the antibiotic even if you start to feel better. Keep all follow-up visits. This is important. This information is not intended to replace advice given to you by your health care provider. Make sure you discuss any questions you have with your health care provider. Document Revised: 12/05/2019 Document Reviewed: 12/10/2019 Elsevier Patient Education  2024 Elsevier Inc.  

## 2023-04-30 ENCOUNTER — Telehealth: Payer: Commercial Managed Care - PPO | Admitting: Psychiatry

## 2023-05-02 ENCOUNTER — Ambulatory Visit: Payer: Commercial Managed Care - PPO | Admitting: Allergy & Immunology

## 2023-05-02 ENCOUNTER — Other Ambulatory Visit (HOSPITAL_COMMUNITY): Payer: Self-pay

## 2023-05-02 ENCOUNTER — Encounter: Payer: Self-pay | Admitting: Allergy & Immunology

## 2023-05-02 ENCOUNTER — Other Ambulatory Visit: Payer: Self-pay

## 2023-05-02 VITALS — BP 120/88 | HR 94 | Temp 98.7°F | Ht 72.0 in | Wt 199.2 lb

## 2023-05-02 DIAGNOSIS — L2089 Other atopic dermatitis: Secondary | ICD-10-CM

## 2023-05-02 DIAGNOSIS — J3089 Other allergic rhinitis: Secondary | ICD-10-CM

## 2023-05-02 DIAGNOSIS — J302 Other seasonal allergic rhinitis: Secondary | ICD-10-CM | POA: Diagnosis not present

## 2023-05-02 MED ORDER — HYDROCORTISONE 2.5 % EX CREA
1.0000 | TOPICAL_CREAM | Freq: Two times a day (BID) | CUTANEOUS | 3 refills | Status: DC
  Filled 2023-05-02: qty 30, 30d supply, fill #0

## 2023-05-02 NOTE — Patient Instructions (Addendum)
1. Seasonal and perennial allergic rhinitis - Testing in the past showed: grasses, ragweed, weeds, trees, indoor molds, outdoor molds, dust mites, cat, and cockroach. - Continue with: Flonase (fluticasone) one spray per nostril TWICE daily  - You can use an extra dose of the antihistamine, if needed, for breakthrough symptoms.  - Consider nasal saline rinses 1-2 times daily to remove allergens from the nasal cavities as well as help with mucous clearance (this is especially helpful to do before the nasal sprays are given) - We will hold off on allergy shots at this time.   2. Flexural atopic dermatitis - Continue with hydrocortisone as needed.   3. Food intolerance - Testing was slightly reactive to soy and hazelnut. - There is no need to avoid these right now.   4. Return in about 1 year (around 05/01/2024). You can have the follow up appointment with Dr. Dellis Anes or a Nurse Practicioner (our Nurse Practitioners are excellent and always have Physician oversight!).    Please inform us of any Emergency Department visits, hospitalizations, or changes in symptoms. Call us before going to the ED for breathing or allergy symptoms since we might be able to fit you in for a sick visit. Feel free to contact us anytime with any questions, problems, or concerns.  It was a pleasure to see you guys again today!  Websites that have reliable patient information: 1. American Academy of Asthma, Allergy, and Immunology: www.aaaai.org 2. Food Allergy Research and Education (FARE): foodallergy.org 3. Mothers of Asthmatics: http://www.asthmacommunitynetwork.org 4. American College of Allergy, Asthma, and Immunology: www.acaai.org      "Like" [place] on Facebook and Instagram for our latest updates!      A healthy democracy works best when [employer] participate! Make sure you are registered to vote! If you have moved or changed any of your contact information, you will need to get this updated before  voting! Scan the QR codes below to learn more!

## 2023-05-02 NOTE — Progress Notes (Signed)
FOLLOW UP  Date of Service/Encounter:  05/02/23   Assessment:   Seasonal and perennial allergic rhinitis (grasses, ragweed, weeds, trees, indoor molds, outdoor molds, dust mites, cat, and cockroach)   Flexural atopic dermatitis - followed by Dr. 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 Seasonal and perennial allergic rhinitis - Testing in the past showed: grasses, ragweed, weeds, trees, indoor molds, outdoor molds, dust mites, cat, and cockroach. - Continue with: Flonase (fluticasone) one spray per nostril TWICE daily  - You can use an extra dose of the antihistamine, if needed, for breakthrough symptoms.  - Consider nasal saline rinses 1-2 times daily to remove allergens from the nasal cavities as well as help with mucous clearance (this is especially helpful to do before the nasal sprays are given) - We will hold off on allergy shots at this time.   2. Flexural atopic dermatitis - Continue with hydrocortisone as needed.   3. Food intolerance - Testing was slightly reactive to soy and hazelnut. - There is no need to avoid these right now.   4. Return in about 1 year (around 05/01/2024). You can have the follow up appointment with Dr. Dellis Anes or a Nurse Practicioner (our Nurse Practitioners are excellent and always have Physician oversight!).   Subjective:   Gabriel [name] is a 47 y.o. male presenting today for follow up of  Chief Complaint  Patient presents with   Allergic Rhinitis     No concerns    Gabriel [name] has a history of the following: Patient Active Problem List   Diagnosis Date Noted   Annual visit for general adult medical examination with abnormal findings 04/08/2023   Recurrent UTI 04/08/2023   FH: prostate cancer 04/08/2023   Encounter for immunization 04/08/2023    Flank pain, chronic 04/08/2023   Vertigo 02/12/2022   Fatigue 09/02/2021   Intractable migraine without aura and with status migrainosus 07/16/2020   Migraine 07/11/2020   Infection of toenail 03/15/2020   Tinea versicolor 03/15/2020   Prediabetes 01/31/2020   Dyslipidemia, goal LDL below 100 06/14/2019   Right lumbar radiculopathy 05/31/2019   Elevated LFTs 05/31/2019   Current moderate episode of major depressive disorder without prior episode (HCC) 05/12/2019   Discolored nails 11/01/2018   Anxiety 01/29/2018   Depression, major, single episode, severe (HCC) 01/29/2018   Vitamin D deficiency 01/29/2018   Acne vulgaris 01/15/2018   Seborrheic dermatitis 01/15/2018   Traumatic injury to skin or subcutaneous tissue 01/15/2018   Bilateral carpal tunnel syndrome 01/14/2018   Insomnia 07/11/2017   Chronic hand pain 07/10/2017   Encounter for annual physical exam 02/21/2016   Overweight (BMI 25.0-29.9) 07/03/2013   IGT (impaired glucose tolerance) 10/31/2012   Perennial allergic rhinitis 10/19/2012   Intrinsic atopic dermatitis 10/19/2012    History obtained from: chart review and patient.  Discussed the use of AI scribe software for clinical note transcription with the patient and/or guardian, who gave verbal consent to proceed.  [name] is a 47 y.o. male presenting for a follow up visit.  We last saw him in April 2024.  At that time, he continued on the Flonase as well as the Xyzal and Astelin.  For his atopic dermatitis, he continued with all of his creams prescribed by Dr. Christin Bach.  For his history of food intolerance, he had testing that was slightly  reactive to soy and hazelnut.  We recommended continued ingestion of these since the reaction was so small.    Since last visit, he has done very well.  Allergic Rhinitis Symptom History: He has been managing his allergies with daily Claritin and Flonase, although he mentions a brief period of increased allergy symptoms  about a week prior. He also tried azelastine but found the taste unpleasant. He denies any recent sinus infections.  Skin Symptom History: His eczema is managed with hydrocortisone cream, and he reports having an adequate supply. He also uses ketoconazole shampoo and Retin-A, but did not request refills for these medications at this time.  He was seeing Dr. Randye Lobo, but he has not been back to see her in a few years.  The patient denies any history of asthma or recent respiratory infections. He also denies any history of smoking. He reports no new symptoms or significant changes in his health since the last visit.  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 He has been under the care of a urologist for this issue. He has not had any further urinary symptoms since the last visit.  The kids is doing well.  His boys are age 21 and 58.  They are very active in basketball.  All they asked for for Christmas were shoes and clothing.    Otherwise, there have been no changes to his past medical history, surgical history, family history, or social history.    Review of systems otherwise negative other than that mentioned in the HPI.    Objective:   Blood pressure 120/88, pulse 94, temperature 98.7 F (37.1 C), height 6' (1.829 m), weight 199 lb 3.2 oz (90.4 kg), SpO2 97%. Body mass index is 27.02 kg/m.    Physical Exam Vitals reviewed.  Constitutional:      Appearance: He is well-developed.     Comments: Very pleasant. Cooperative with the exam.   HENT:     Head: Normocephalic and atraumatic.     Right Ear: Tympanic membrane, ear canal and external ear normal. No drainage, swelling or tenderness. Tympanic membrane is not injected, scarred, erythematous, retracted or bulging.     Left Ear: Tympanic membrane, ear canal and external ear normal. No drainage, swelling or tenderness. Tympanic membrane is not injected, scarred,  erythematous, retracted or bulging.     Nose: No nasal deformity, septal deviation, mucosal edema or rhinorrhea.     Right Turbinates: Enlarged, swollen and pale.     Left Turbinates: Enlarged, swollen and pale.     Right Sinus: No maxillary sinus tenderness or frontal sinus tenderness.     Left Sinus: No maxillary sinus tenderness or frontal sinus tenderness.     Comments: No nasal polyps noted.     Mouth/Throat:     Mouth: Mucous membranes are not pale and not dry.     Pharynx: Uvula midline.  Eyes:     General: Allergic shiner present.        Right eye: No discharge.        Left eye: No discharge.     Conjunctiva/sclera: Conjunctivae normal.     Right eye: Right conjunctiva is not injected. No chemosis.    Left eye: Left conjunctiva is not injected. No chemosis.    Pupils: Pupils are equal, round, and reactive to light.  Cardiovascular:     Rate and Rhythm: Normal rate and regular rhythm.     Heart  sounds: Normal heart sounds.  Pulmonary:     Effort: Pulmonary effort is normal. No tachypnea, accessory muscle usage or respiratory distress.     Breath sounds: Normal breath sounds. No wheezing, rhonchi or rales.  Chest:     Chest wall: No tenderness.  Lymphadenopathy:     Head:     Right side of head: No submandibular, tonsillar or occipital adenopathy.     Left side of head: No submandibular, tonsillar or occipital adenopathy.     Cervical: No cervical adenopathy.  Skin:    General: Skin is warm.     Capillary Refill: Capillary refill takes less than 2 seconds.     Coloration: Skin is not pale.     Findings: No abrasion, erythema, petechiae or rash. Rash is not papular, urticarial or vesicular.     Comments: He does have several hyperpigmented lesions over his entire body, especially on his back. He has some scarring on his face secondary to either eczema or acne.   Neurological:     Mental Status: He is alert.  Psychiatric:        Behavior: Behavior is cooperative.       Diagnostic studies: none      Malachi Bonds, MD  [facility]

## 2023-05-16 ENCOUNTER — Ambulatory Visit (HOSPITAL_COMMUNITY): Payer: Commercial Managed Care - PPO | Admitting: Psychiatry

## 2023-05-16 DIAGNOSIS — F322 Major depressive disorder, single episode, severe without psychotic features: Secondary | ICD-10-CM | POA: Diagnosis not present

## 2023-05-16 DIAGNOSIS — F3341 Major depressive disorder, recurrent, in partial remission: Secondary | ICD-10-CM

## 2023-05-16 NOTE — Progress Notes (Signed)
 Virtual Visit via Video Note  I connected with Gabriel [name] on 05/16/23 at 10:08 AM EST  by a video enabled telemedicine application and verified that I am speaking with the correct person using two identifiers.  Location: Patient: Home Provider: [facility] [place] [facility]    I discussed the limitations of evaluation and management by telemedicine and the availability of in person appointments. The patient expressed understanding and agreed to proceed.   I provided 48 minutes of non-face-to-face time during this encounter.   Gabriel FORBES Rubinstein, LCSW        THERAPIST PROGRESS NOTE    Session Time:  Friday  05/16/2023 10:08 AM - 10:52 AM   Participation Level: Active  Treatment Goals addressed:   Improve ability to manage stress and anxiety AEB reducing episodes of anxiety (worry & nervousness) from 4 x per week to 1 x per week for 60 days per pt's self-report . Increase social interaction beyond immediate family to 3-4 x per week per pt's report.  Report a decrease in anxiety symptoms as evidenced by an overall reduction in anxiety score by a minimum of 25% on the Generalized Anxiety Disorder Scale for 2 months.     Progress in treatment:  progressing    Interventions: CBT and Supportive  Summary: Gabriel [name] is a 48 y.o. male who is referred for services by PCP Dr. Rollene Pesa due to patient experiencing symptoms of depression. He denies any psychiatric hospitalizations, He saw therapist Toney Shelter in [place] for a few months. Patient reports symptoms began when he lost his job about a year ago. Per his report, he was fired after he complained about race discrimination. 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 He states now feeling as though he failed his family. He states not wanting to be around anyone and having difficulty engaging  with his wife and children. He fears losing his family.  He reports ruminating thoughts about job and the past, excessive worry depressed mood, tearfulness, anxiety, isolative behaviors, poor motivation, poor concentration, irritability, sleep difficulty, thoughts and feelings of hopelessness and worthlessness. He denies any suicidal ideations.  Patient last was seen via virtual visit about 4-5 weeks ago. He reports less depressed mood and increased involvement in activity since last session. He reports going with his wife to his in-laws home for Thanksgiving with nudging and encouragement from wife. He admits he was nervous and having negative thoughts about self and possible outcome. However, he reports actually enjoying the event and states it was much better than he thought it would be. He is pleased he went. He reports decreased anxiety about political issues but still reports periods of anxiety and worry about losing his wife and family. 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 He now has thoughts of anything can happen and has frequent what if thoughts.     SI/HI .Nowithout intent/plan  Therapist Response:  reviewed symptoms, praised and reinforce pt's efforts to attend Thanksgiving dinner at his in-laws home, assisted patient identify and examine his thoughts and behaviors as well as his feelings prior, during, and after attendance, assisted patient identify connection between thoughts/mood/behavior, assisted patient identify how he related to negative thoughts regarding attending the event and identify his values that caused him to attend  the event, assisted patient identify his worry thoughts about his family and began to identify ways to relate to the thoughts differently, used cognitive diffusion to assist patient cope with ruminating thoughts, developed plan with patient to use strategies discussed in session  Diagnosis: Axis I:  MDD, single episode, severe    Axis II: No diagnosis  Collaboration of Care: Psychiatrist AEB patient working with psychiatrist Dr. Vickey  Patient/Guardian was advised Release of Information must be obtained prior to any record release in order to collaborate their care with an outside provider. Patient/Guardian was advised if they have not already done so to contact the registration department to sign all necessary forms in order for us  to release information regarding their care.   Consent: Patient/Guardian gives verbal consent for treatment and assignment of benefits for services provided during this visit. Patient/Guardian expressed understanding and agreed to proceed.   Gabriel FORBES Rubinstein, LCSW 05/16/2023

## 2023-05-30 ENCOUNTER — Ambulatory Visit (INDEPENDENT_AMBULATORY_CARE_PROVIDER_SITE_OTHER): Payer: Commercial Managed Care - PPO | Admitting: Psychiatry

## 2023-05-30 DIAGNOSIS — F3341 Major depressive disorder, recurrent, in partial remission: Secondary | ICD-10-CM

## 2023-05-30 NOTE — Progress Notes (Signed)
Virtual Visit via Video Note  I connected with Gabriel [name] on 05/30/23 at 11:11 AM EST  by a video enabled telemedicine application and verified that I am speaking with the correct person using two identifiers.  Location: Patient: Home Provider: [facility] [place] office    I discussed the limitations of evaluation and management by telemedicine and the availability of in person appointments. The patient expressed understanding and agreed to proceed.  I provided 34 minutes of non-face-to-face time during this encounter.   [name], LCSW         THERAPIST PROGRESS NOTE    Session Time:  Friday  05/30/2023 11:11 AM - 11:45 AM   Participation Level: Active  Treatment Goals addressed:   Improve ability to manage stress and anxiety AEB reducing episodes of anxiety (worry & nervousness) from 4 x per week to 1 x per week for 60 days per pt's self-report . Increase social interaction beyond immediate family to 3-4 x per week per pt's report.  Report a decrease in anxiety symptoms as evidenced by an overall reduction in anxiety score by a minimum of 25% on the Generalized Anxiety Disorder Scale for 2 months.     Progress in treatment:  progressing    Interventions: CBT and Supportive  Summary: Gabriel [name] is a 48 y.o. male who is referred for services by PCP Dr. Syliva Overman due to patient experiencing symptoms of depression. He denies any psychiatric hospitalizations, He saw therapist Dorann Lodge in [place] for a few months. Patient reports symptoms began when he lost his job about a year ago. Per his report, he was fired after he complained about race discrimination. 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 He states now feeling as though he failed his family. He states not wanting to be around anyone and having difficulty engaging  with his wife and children. He fears losing his family.  He reports ruminating thoughts about job and the past, excessive worry depressed mood, tearfulness, anxiety, isolative behaviors, poor motivation, poor concentration, irritability, sleep difficulty, thoughts and feelings of hopelessness and worthlessness. He denies any suicidal ideations.  Patient last was seen via virtual visit about 2 weeks  ago. He reports minimal to no symptoms of depression sine last session. He also reports decreased nervousness and worry to just a few times per week. Per pt's report, he has been using strategies discussed in last session to cope with worry about losing his wife and family. He reports letting thought go and shifting his attention to something else as well as being in the moment. Per his report, he didn't get stuck in his thoughts. He also did not isolate like he normally would do. He also states feeling less tense.   SI/HI .Nowithout intent/plan  Therapist Response:  reviewed symptoms, praised and reinforce pt's efforts to use strategies discussed last session to cope with worry thoughts, discussed effects, assisted pt identify how to practice this with other thoughts, continued to discuss pt's values and other behaviors he would like to pursue consistent with his value, developed plan to go with wife for a drive, assisted pt identify thoughts that may inhibit implementation of plan and used cognitive diffusion to address   Diagnosis: Axis I: MDD, single episode, severe    Axis II: No diagnosis  Collaboration of Care: Psychiatrist AEB  patient working with psychiatrist Dr. Vanetta Shawl  Patient/Guardian was advised Release of Information must be obtained prior to any record release in order to collaborate their care with an outside provider. Patient/Guardian was advised if they have not already done so to contact the registration department to sign all necessary forms in order for [place] to release information  regarding their care.   Consent: Patient/Guardian gives verbal consent for treatment and assignment of benefits for services provided during this visit. Patient/Guardian expressed understanding and agreed to proceed.   [name], LCSW 05/30/2023

## 2023-06-13 NOTE — Progress Notes (Signed)
 Virtual Visit via Video Note  I connected with Gabriel [name] on 06/18/23 at 10:30 AM EST by a video enabled telemedicine application and verified that I am speaking with the correct person using two identifiers.  Location: Patient: home Provider: office Persons participated in the visit- patient, provider    I discussed the limitations of evaluation and management by telemedicine and the availability of in person appointments. The patient expressed understanding and agreed to proceed.  I discussed the assessment and treatment plan with the patient. The patient was provided an opportunity to ask questions and all were answered. The patient agreed with the plan and demonstrated an understanding of the instructions.   The patient was advised to call back or seek an in-person evaluation if the symptoms worsen or if the condition fails to improve as anticipated.    [name], MD    [facility] MD/PA/NP OP Progress Note  06/18/2023 11:01 AM Rand Etchison  MRN:  [id number]  Chief Complaint:  Chief Complaint  Patient presents with   Follow-up   HPI:  This is a follow-up appointment for depression and anxiety.  He states that he had a good holiday.  They visit his mother-in-law.  He is trying to go out to get fresh air, and deep breathing.  He is trying to relax.  He states that he tends to think about certain things, although he tries not to think about.  He tends to think about the job where he was mistreated horribly.  He was triggered when he heard the president comment about people with mental health.  He feels people do not care and evil. He does not go outside often as he feels more comfortable at home. However, he agrees to take small steps to step out of his comfort zone, recognizing that he may be missing opportunities for connection with others.  He does not notice much difference since switching from pregabalin  to gabapentin .  He denies drowsiness except that he feels a little  drowsy, which he attributes to insomnia.  He denies SI. He denies panic attacks.    Visit Diagnosis:    ICD-10-CM   1. MDD (major depressive disorder), recurrent, in partial remission (HCC)  F33.41     2. Anxiety  F41.9     3. Insomnia, unspecified type  G47.00       Past Psychiatric History: Please see initial evaluation for full details. I have reviewed the history. No updates at this time.     Past Medical History:  Past Medical History:  Diagnosis Date   Allergy     Anxiety    Carpal tunnel syndrome 2018   Depression    Depression    [employer] 05/15/2020   Eczema    Elevated LFTs    Heart murmur    Insomnia    Seasonal allergies    Vitamin D  deficiency     Past Surgical History:  Procedure Laterality Date   COLONOSCOPY     2024   NO PAST SURGERIES      Family Psychiatric History: Please see initial evaluation for full details. I have reviewed the history. No updates at this time.     Family History:  Family History  Problem Relation Age of Onset   Allergic rhinitis Mother    Hypertension Mother    Colon polyps Father    Diabetes Father    Hypertension Father    Hypertension Maternal Grandfather    Diabetes Maternal Grandfather    Cancer Paternal Grandmother  type unknown   Hypertension Paternal Grandmother    Cancer Paternal Grandfather        type unknown   Hypertension Paternal Grandfather    Colon cancer Neg Hx    Esophageal cancer Neg Hx    Rectal cancer Neg Hx    Stomach cancer Neg Hx     Social History:  Social History   Socioeconomic History   Marital status: Married    Spouse name: Not on file   Number of children: 2   Years of education: 12   Highest education level: High school graduate  Occupational History   Occupation: not employed  Tobacco Use   Smoking status: Never    Passive exposure: Never   Smokeless tobacco: Never  Vaping Use   Vaping status: Never Used  Substance and Sexual Activity   Alcohol use: No     Alcohol/week: 0.0 standard drinks of alcohol   Drug use: No   Sexual activity: Yes    Birth control/protection: None  Other Topics Concern   Not on file  Social History Narrative   He lives with wife and two children.   Unemployed at this present time   Right handed   One story home   Highest level of education:  12th grade   Social Drivers of Health   Financial Resource Strain: Medium Risk (09/08/2017)   Overall Financial Resource Strain (CARDIA)    Difficulty of Paying Living Expenses: Somewhat hard  Food Insecurity: No Food Insecurity (09/08/2017)   Hunger Vital Sign    Worried About Running Out of Food in the Last Year: Never true    Ran Out of Food in the Last Year: Never true  Transportation Needs: No Transportation Needs (09/08/2017)   PRAPARE - Administrator, Civil Service (Medical): No    Lack of Transportation (Non-Medical): No  Physical Activity: Not on file  Stress: Stress Concern Present (09/08/2017)   [employer] of Occupational Health - Occupational Stress Questionnaire    Feeling of Stress : Very much  Social Connections: Not on file    Allergies: No Known Allergies  Metabolic Disorder Labs: Lab Results  Component Value Date   HGBA1C 6.3 (H) 04/08/2023   MPG 137 05/27/2019   MPG 137 12/29/2018   No results found for: PROLACTIN Lab Results  Component Value Date   CHOL 155 09/03/2022   TRIG 64 09/03/2022   HDL 45 09/03/2022   CHOLHDL 3.4 09/03/2022   VLDL 10 12/04/2016   LDLCALC 97 09/03/2022   LDLCALC 113 (H) 04/02/2022   Lab Results  Component Value Date   TSH 1.440 09/03/2022   TSH 1.790 09/17/2021    Therapeutic Level Labs: No results found for: LITHIUM No results found for: VALPROATE No results found for: CBMZ  Current Medications: Current Outpatient Medications  Medication Sig Dispense Refill   pregabalin  (LYRICA ) 50 MG capsule Take 1 capsule (50 mg total) by mouth 2 (two) times daily. 60 capsule 1    azelastine  (ASTELIN ) 0.1 % nasal spray Place 1 spray into both nostrils 2 (two) times daily. Use in each nostril as directed 30 mL 5   Brexpiprazole  (REXULTI ) 3 MG TABS Take 1 tablet (3 mg total) by mouth daily. 90 tablet 1   DULoxetine  (CYMBALTA ) 60 MG capsule Take 1 capsule (60 mg total) by mouth daily. 90 capsule 1   fluticasone  (FLONASE ) 50 MCG/ACT nasal spray Place 1 spray into both nostrils daily. 16 g 5   hydrocortisone  2.5 %  cream Apply to face 2 times daily for 3 weeks, then daily for 3 weeks, then every other daily for 3 weeks, then stop. Repeat cycle as needed. 30 g 3   ketoconazole  (NIZORAL ) 2 % shampoo Apply to face twice weekly and back daily for 1 week, then twice weekly thereafter. 120 mL 11   loratadine  (CLARITIN ) 10 MG tablet Take 10 mg by mouth daily as needed.     meclizine  (ANTIVERT ) 25 MG tablet Take 1 tablet (25 mg total) by mouth 3 (three) times daily as needed for dizziness. 30 tablet 1   mirtazapine  (REMERON ) 30 MG tablet Take 1 tablet (30 mg total) by mouth at bedtime. 90 tablet 1   NONFORMULARY OR COMPOUNDED ITEM Terbinafine 3% Fluconazole 2% Tea Tree Oil 5% Urea 10% Ibuprofen  2% in DMSO Suspension #30ml Apply to affected nails once at bedtime or twice daily     topiramate  (TOPAMAX ) 100 MG tablet Take 1 tablet (100 mg total) by mouth 2 (two) times daily. 180 tablet 1   tretinoin (RETIN-A) 0.025 % cream Apply 1 application topically at bedtime.     Vitamin D , Ergocalciferol , (DRISDOL ) 1.25 MG (50000 UNIT) CAPS capsule Take 1 capsule (50,000 Units total) by mouth every 7 (seven) days. 12 capsule 1   No current facility-administered medications for this visit.     Musculoskeletal: Strength & Muscle Tone:  N/A Gait & Station:  N/A Patient leans: N/A  Psychiatric Specialty Exam: Review of Systems  Psychiatric/Behavioral:  Positive for sleep disturbance. Negative for agitation, behavioral problems, confusion, decreased concentration, dysphoric mood, hallucinations,  self-injury and suicidal ideas. The patient is nervous/anxious. The patient is not hyperactive.   All other systems reviewed and are negative.   There were no vitals taken for this visit.There is no height or weight on file to calculate BMI.  General Appearance: Well Groomed  Eye Contact:  Good  Speech:  Clear and Coherent  Volume:  Normal  Mood:  Anxious  Affect:  Appropriate, Congruent, and slightly down  Thought Process:  Coherent  Orientation:  Full (Time, Place, and Person)  Thought Content: Logical   Suicidal Thoughts:  No  Homicidal Thoughts:  No  Memory:  Immediate;   Good  Judgement:  Good  Insight:  Good  Psychomotor Activity:  Normal  Concentration:  Concentration: Good and Attention Span: Good  Recall:  Good  Fund of Knowledge: Good  Language: Good  Akathisia:  No  Handed:  Right  AIMS (if indicated): not done  Assets:  Communication Skills Desire for Improvement  ADL's:  Intact  Cognition: WNL  Sleep:  Poor   Screenings: GAD-7    Flowsheet Row Office Visit from 04/08/2023 in [facility] Primary [facility] Counselor from 03/24/2023 in [facility] at [place] Counselor from 01/31/2023 in [facility] at [place] Counselor from 10/25/2022 in [facility] at [place] Counselor from 08/01/2022 in [facility] at [place]  Total GAD-7 Score 0 8 7 10 7       PHQ2-9    Flowsheet Row Office Visit from 04/08/2023 in [facility] Primary [facility] Counselor from 03/24/2023 in [facility] at [place] Office Visit from 09/04/2022 in [facility] Primary Care Office Visit from 04/03/2022 in [facility] Office Visit from 02/12/2022 in [facility]  PHQ-2 Total Score 0 2 2 2 2   PHQ-9 Total Score -- 9 9 11  10  Flowsheet Row Counselor from 03/24/2023 in [facility] at [place] ED from 02/20/2023 in [facility] from 05/24/2022 in [facility] at [place]  C-SSRS RISK CATEGORY No Risk No Risk No Risk        Assessment and Plan:  Gabriel [name] is a 48 y.o. year old male with a history of depression, bilateral carpel tunnel syndrome, who presents for follow up appointment for below.   1. MDD (major depressive disorder), recurrent, in partial remission (HCC) 2. Anxiety Acute stressors include:  Other stressors include: unemployment, mistreatment at his previous work, carpel tunnel syndrome, lower back pain, headache, loss of his maternal aunt    History:  not interested in TMS  He reports slight worsening in anxiety, ruminative thoughts about the previous work where he received mistreatment in the context of current administration.  Will uptitrate pregabalin  to optimize treatment for anxiety, neuropathic pain while monitoring any drowsiness.  Will continue current dose of duloxetine , mirtazapine , and rexulti  to target depression.   3. Insomnia, unspecified type - sleep study was not consistent with sleep apnea in 2023      He continues to experience middle insomnia.  We will titrate pregabalin  as above to improve pain/anxiety, which could contribute to insomnia.    Plan  Continue duloxetine  60 mg daily (limited benefit from higher dose)  Continue mirtazapine  30 mg at night  Continue Rexulti  3 mg at night - monitor drowsiness  Increase pregabalin  50 mg twice a day.  Next appointment: 4/2 at 9 30 am for 30 mins , video [email or web address].  - on tramadol    - on topiramate  for migraine   Past trials of medication: sertraline , fluoxetine , venlafaxine , mirtazapine , nortriptyline  for carpel tunnel syndrome, bupropion  (drowsiness), quetiapine  (drowsiness), Abilify  (off balance), Trazodone , Ambien  (limited benefit), lorazepam  (worsening in anxiety, drowsiness) , temazepam ,  Lunesta       Collaboration of Care: Collaboration of Care: Other reviewed notes in Epic  Patient/Guardian was advised Release of Information must be obtained prior to any record release in order to collaborate their care with an outside provider. Patient/Guardian was advised if they have not already done so to contact the registration department to sign all necessary forms in order for us  to release information regarding their care.   Consent: Patient/Guardian gives verbal consent for treatment and assignment of benefits for services provided during this visit. Patient/Guardian expressed understanding and agreed to proceed.    [name], MD 06/18/2023, 11:01 AM

## 2023-06-18 ENCOUNTER — Other Ambulatory Visit (HOSPITAL_COMMUNITY): Payer: Self-pay

## 2023-06-18 ENCOUNTER — Telehealth: Payer: Commercial Managed Care - PPO | Admitting: Psychiatry

## 2023-06-18 ENCOUNTER — Encounter: Payer: Self-pay | Admitting: Psychiatry

## 2023-06-18 DIAGNOSIS — F419 Anxiety disorder, unspecified: Secondary | ICD-10-CM

## 2023-06-18 DIAGNOSIS — G4700 Insomnia, unspecified: Secondary | ICD-10-CM

## 2023-06-18 DIAGNOSIS — F3341 Major depressive disorder, recurrent, in partial remission: Secondary | ICD-10-CM

## 2023-06-18 MED ORDER — MIRTAZAPINE 30 MG PO TABS
30.0000 mg | ORAL_TABLET | Freq: Every day | ORAL | 1 refills | Status: DC
  Filled 2023-06-18: qty 90, 90d supply, fill #0
  Filled 2023-10-09: qty 90, 90d supply, fill #1

## 2023-06-18 MED ORDER — DULOXETINE HCL 60 MG PO CPEP
60.0000 mg | ORAL_CAPSULE | Freq: Every day | ORAL | 1 refills | Status: DC
  Filled 2023-06-18: qty 90, 90d supply, fill #0
  Filled 2023-10-09: qty 90, 90d supply, fill #1

## 2023-06-18 MED ORDER — PREGABALIN 50 MG PO CAPS
50.0000 mg | ORAL_CAPSULE | Freq: Two times a day (BID) | ORAL | 1 refills | Status: DC
  Filled 2023-06-18: qty 60, 30d supply, fill #0

## 2023-06-18 NOTE — Patient Instructions (Signed)
 Continue duloxetine  60 mg daily  Continue mirtazapine  30 mg at night  Continue Rexulti  3 mg at night - monitor drowsiness  Increase pregabalin  50 mg twice a day.  Next appointment: 4/2 at 9 30

## 2023-07-01 DIAGNOSIS — H52223 Regular astigmatism, bilateral: Secondary | ICD-10-CM | POA: Diagnosis not present

## 2023-07-16 ENCOUNTER — Other Ambulatory Visit: Payer: Self-pay | Admitting: Allergy & Immunology

## 2023-07-16 ENCOUNTER — Other Ambulatory Visit (HOSPITAL_COMMUNITY): Payer: Self-pay

## 2023-07-16 MED ORDER — FLUTICASONE PROPIONATE 50 MCG/ACT NA SUSP
1.0000 | Freq: Every day | NASAL | 5 refills | Status: DC
  Filled 2023-07-16: qty 16, 60d supply, fill #0

## 2023-08-06 ENCOUNTER — Ambulatory Visit: Payer: Commercial Managed Care - PPO | Admitting: Family Medicine

## 2023-08-06 ENCOUNTER — Encounter: Payer: Self-pay | Admitting: Family Medicine

## 2023-08-06 VITALS — BP 123/75 | HR 71 | Resp 16 | Ht 72.0 in | Wt 206.0 lb

## 2023-08-06 DIAGNOSIS — L2084 Intrinsic (allergic) eczema: Secondary | ICD-10-CM | POA: Diagnosis not present

## 2023-08-06 DIAGNOSIS — E663 Overweight: Secondary | ICD-10-CM | POA: Diagnosis not present

## 2023-08-06 DIAGNOSIS — F322 Major depressive disorder, single episode, severe without psychotic features: Secondary | ICD-10-CM

## 2023-08-06 DIAGNOSIS — R7302 Impaired glucose tolerance (oral): Secondary | ICD-10-CM

## 2023-08-06 DIAGNOSIS — E785 Hyperlipidemia, unspecified: Secondary | ICD-10-CM | POA: Diagnosis not present

## 2023-08-06 DIAGNOSIS — F419 Anxiety disorder, unspecified: Secondary | ICD-10-CM

## 2023-08-06 NOTE — Assessment & Plan Note (Signed)
 Patient educated about the importance of limiting  Carbohydrate intake , the need to commit to daily physical activity for a minimum of 30 minutes , and to commit weight loss. The fact that changes in all these areas will reduce or eliminate all together the development of diabetes is stressed.      Latest Ref Rng & Units 04/08/2023   11:22 AM 09/03/2022    8:28 AM 04/02/2022    8:35 AM 09/17/2021   10:24 AM 03/02/2021    9:59 AM  Diabetic Labs  HbA1c 4.8 - 5.6 % 6.3  6.3  6.2  6.3  6.0   Chol 100 - 199 mg/dL  147  829  562    HDL >13 mg/dL  45  43  45    Calc LDL 0 - 99 mg/dL  97  086  578    Triglycerides 0 - 149 mg/dL  64  74  68    Creatinine 0.76 - 1.27 mg/dL 4.69  6.29  5.28  4.13        08/06/2023    9:29 AM 05/02/2023   10:09 AM 04/23/2023   10:14 AM 04/08/2023    9:58 AM 02/20/2023    3:49 PM 12/19/2022    9:56 AM 12/19/2022    9:46 AM  BP/Weight  Systolic BP 123 120 119 114 131 114 113  Diastolic BP 75 88 71 78 85 75 69  Wt. (Lbs) 206 199.2  202     BMI 27.94 kg/m2 27.02 kg/m2  27.4 kg/m2          No data to display          Updated lab needed at/ before next visit.

## 2023-08-06 NOTE — Patient Instructions (Addendum)
 Follow-up in 3 months, call if you need me sooner.  Labs today and message will be sent to you.  Start 20 to 30-minute sessions 5 days a week with your sons with music time where everybody gets to choose music to enjoy together like we discussed.  Continue exercise commitment of walking in your driveway every day.  Reduce sugar that you are drinking as in  tea and sodas and juices and drink water.  Continue working on controlling what you are able to and accepting the fact that there are many things that are outside of our control   Continue to be intentional and seeking out health care that you need, you are doing extremely well and have  the support of the wonderful family.  Thanks for choosing [facility], we consider it a privelige to serve you.

## 2023-08-06 NOTE — Assessment & Plan Note (Addendum)
 Hyperlipidemia:Low fat diet discussed and encouraged.   Lipid Panel  Lab Results  Component Value Date   CHOL 155 09/03/2022   HDL 45 09/03/2022   LDLCALC 97 09/03/2022   TRIG 64 09/03/2022   CHOLHDL 3.4 09/03/2022     Updated lab needed at/ before next visit.

## 2023-08-06 NOTE — Assessment & Plan Note (Addendum)
 Score of 18 check tSH and message psych, worsened and uncontrolled

## 2023-08-07 ENCOUNTER — Encounter: Payer: Self-pay | Admitting: Family Medicine

## 2023-08-07 LAB — CMP14+EGFR
ALT: 56 IU/L — ABNORMAL HIGH (ref 0–44)
AST: 33 IU/L (ref 0–40)
Albumin: 4.7 g/dL (ref 4.1–5.1)
Alkaline Phosphatase: 125 IU/L — ABNORMAL HIGH (ref 44–121)
BUN/Creatinine Ratio: 12 (ref 9–20)
BUN: 12 mg/dL (ref 6–24)
Bilirubin Total: 0.4 mg/dL (ref 0.0–1.2)
CO2: 25 mmol/L (ref 20–29)
Calcium: 9.7 mg/dL (ref 8.7–10.2)
Chloride: 103 mmol/L (ref 96–106)
Creatinine, Ser: 1.02 mg/dL (ref 0.76–1.27)
Globulin, Total: 2.2 g/dL (ref 1.5–4.5)
Glucose: 109 mg/dL — ABNORMAL HIGH (ref 70–99)
Potassium: 4.9 mmol/L (ref 3.5–5.2)
Sodium: 144 mmol/L (ref 134–144)
Total Protein: 6.9 g/dL (ref 6.0–8.5)
eGFR: 91 mL/min/1.73

## 2023-08-07 LAB — HEMOGLOBIN A1C
Est. average glucose Bld gHb Est-mCnc: 140 mg/dL
Hgb A1c MFr Bld: 6.5 % — ABNORMAL HIGH (ref 4.8–5.6)

## 2023-08-07 LAB — TSH+FREE T4
Free T4: 1.03 ng/dL (ref 0.82–1.77)
TSH: 1.8 u[IU]/mL (ref 0.450–4.500)

## 2023-08-09 NOTE — Progress Notes (Unsigned)
 Virtual Visit via Video Note  I connected with Gabriel [name] on 08/13/23 at  9:30 AM EDT by a video enabled telemedicine application and verified that I am speaking with the correct person using two identifiers.  Location: Patient: home Provider: office Persons participated in the visit- patient, provider    I discussed the limitations of evaluation and management by telemedicine and the availability of in person appointments. The patient expressed understanding and agreed to proceed.    I discussed the assessment and treatment plan with the patient. The patient was provided an opportunity to ask questions and all were answered. The patient agreed with the plan and demonstrated an understanding of the instructions.   The patient was advised to call back or seek an in-person evaluation if the symptoms worsen or if the condition fails to improve as anticipated.   [name], MD    [facility] MD/PA/NP OP Progress Note  08/13/2023 10:00 AM Gabriel [name]  MRN:  [id number]  Chief Complaint:  Chief Complaint  Patient presents with   Follow-up   HPI:  This is a follow-up appointment for depression and anxiety.  He states that his anxiety is up.  He feels uneasy about the current.  He states that programs are taking away from people.  He does not know what to expect.  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  He experiences racism, which comes with it.  It has been triggering pain.  It takes a toll on him, although he has been trying to relax.  Things are still happening.  He finds breathing exercise to be helpful.  He is trying to relax as much as he can.  He spends time, watching his kids playing basketball.  This gives him some peace.  He sleeps up to 5 hours with middle insomnia.  Although he denies feeling depressed, he feels on edge all the time.  He denies SI.  He denies alcohol use or drug use.  He denies any drowsiness since uptitration of pregabalin.   Visit Diagnosis:     ICD-10-CM   1. MDD (major depressive disorder), recurrent, in partial remission (HCC)  F33.41     2. Anxiety  F41.9     3. Insomnia, unspecified type  G47.00       Past Psychiatric History: Please see initial evaluation for full details. I have reviewed the history. No updates at this time.     Past Medical History:  Past Medical History:  Diagnosis Date   Allergy    Anxiety    Carpal tunnel syndrome 2018   Depression    Depression    Phreesia 05/15/2020   Eczema    Elevated LFTs    Heart murmur    Insomnia    Seasonal allergies    Vitamin D deficiency     Past Surgical History:  Procedure Laterality Date   COLONOSCOPY     2024   NO PAST SURGERIES      Family Psychiatric History: Please see initial evaluation for full details. I have reviewed the history. No updates at this time.     Family History:  Family History  Problem Relation Age of Onset   Allergic rhinitis Mother    Hypertension Mother    Colon polyps Father    Diabetes Father    Hypertension Father    Hypertension Maternal Grandfather    Diabetes Maternal Grandfather    Cancer Paternal Grandmother        type unknown  Hypertension Paternal Grandmother    Cancer Paternal Grandfather        type unknown   Hypertension Paternal Grandfather    Colon cancer Neg Hx    Esophageal cancer Neg Hx    Rectal cancer Neg Hx    Stomach cancer Neg Hx     Social History:  Social History   Socioeconomic History   Marital status: Married    Spouse name: Not on file   Number of children: 2   Years of education: 12   Highest education level: High school graduate  Occupational History   Occupation: not employed  Tobacco Use   Smoking status: Never    Passive exposure: Never   Smokeless tobacco: Never  Vaping Use   Vaping status: Never Used  Substance and Sexual Activity   Alcohol use: No    Alcohol/week: 0.0 standard drinks of alcohol   Drug use: No   Sexual activity: Yes    Birth  control/protection: None  Other Topics Concern   Not on file  Social History Narrative   He lives with wife and two children.   Unemployed at this present time   Right handed   One story home   Highest level of education:  12th grade   Social Drivers of Health   Financial Resource Strain: Medium Risk (09/08/2017)   Overall Financial Resource Strain (CARDIA)    Difficulty of Paying Living Expenses: Somewhat hard  Food Insecurity: No Food Insecurity (09/08/2017)   Hunger Vital Sign    Worried About Running Out of Food in the Last Year: Never true    Ran Out of Food in the Last Year: Never true  Transportation Needs: No Transportation Needs (09/08/2017)   PRAPARE - Administrator, Civil Service (Medical): No    Lack of Transportation (Non-Medical): No  Physical Activity: Not on file  Stress: Stress Concern Present (09/08/2017)   [employer] of Occupational Health - Occupational Stress Questionnaire    Feeling of Stress : Very much  Social Connections: Not on file    Allergies: No Known Allergies  Metabolic Disorder Labs: Lab Results  Component Value Date   HGBA1C 6.5 (H) 08/06/2023   MPG 137 05/27/2019   MPG 137 12/29/2018   No results found for: "PROLACTIN" Lab Results  Component Value Date   CHOL 155 09/03/2022   TRIG 64 09/03/2022   HDL 45 09/03/2022   CHOLHDL 3.4 09/03/2022   VLDL 10 12/04/2016   LDLCALC 97 09/03/2022   LDLCALC 113 (H) 04/02/2022   Lab Results  Component Value Date   TSH 1.800 08/06/2023   TSH 1.440 09/03/2022    Therapeutic Level Labs: No results found for: "LITHIUM" No results found for: "VALPROATE" No results found for: "CBMZ"  Current Medications: Current Outpatient Medications  Medication Sig Dispense Refill   azelastine (ASTELIN) 0.1 % nasal spray Place 1 spray into both nostrils 2 (two) times daily. Use in each nostril as directed 30 mL 5   Brexpiprazole (REXULTI) 3 MG TABS Take 1 tablet (3 mg total) by mouth  daily. 90 tablet 1   DULoxetine (CYMBALTA) 60 MG capsule Take 1 capsule (60 mg total) by mouth daily. 90 capsule 1   fluticasone (FLONASE) 50 MCG/ACT nasal spray Place 1 spray into both nostrils daily. 16 g 5   hydrocortisone 2.5 % cream Apply to face 2 times daily for 3 weeks, then daily for 3 weeks, then every other daily for 3 weeks, then stop. Repeat cycle  as needed. 30 g 3   ketoconazole (NIZORAL) 2 % shampoo Apply to face twice weekly and back daily for 1 week, then twice weekly thereafter. 120 mL 11   loratadine (CLARITIN) 10 MG tablet Take 10 mg by mouth daily as needed.     meclizine (ANTIVERT) 25 MG tablet Take 1 tablet (25 mg total) by mouth 3 (three) times daily as needed for dizziness. 30 tablet 1   mirtazapine (REMERON) 30 MG tablet Take 1 tablet (30 mg total) by mouth at bedtime. 90 tablet 1   NONFORMULARY OR COMPOUNDED ITEM Terbinafine 3% Fluconazole 2% Tea Tree Oil 5% Urea 10% Ibuprofen 2% in DMSO Suspension #37ml Apply to affected nails once at bedtime or twice daily     pregabalin (LYRICA) 75 MG capsule Take 1 capsule (75 mg total) by mouth 2 (two) times daily. 60 capsule 1   topiramate (TOPAMAX) 100 MG tablet Take 1 tablet (100 mg total) by mouth 2 (two) times daily. 180 tablet 1   tretinoin (RETIN-A) 0.025 % cream Apply 1 application topically at bedtime.     Vitamin D, Ergocalciferol, (DRISDOL) 1.25 MG (50000 UNIT) CAPS capsule Take 1 capsule (50,000 Units total) by mouth every 7 (seven) days. (Patient not taking: Reported on 08/06/2023) 12 capsule 1   No current facility-administered medications for this visit.     Musculoskeletal: Strength & Muscle Tone:  N/A Gait & Station:  N/A Patient leans: N/A  Psychiatric Specialty Exam: Review of Systems  Psychiatric/Behavioral:  Positive for sleep disturbance. Negative for agitation, behavioral problems, confusion, decreased concentration, dysphoric mood, hallucinations, self-injury and suicidal ideas. The patient is  nervous/anxious. The patient is not hyperactive.   All other systems reviewed and are negative.   There were no vitals taken for this visit.There is no height or weight on file to calculate BMI.  General Appearance: Well Groomed  Eye Contact:  Good  Speech:  Clear and Coherent  Volume:  Normal  Mood:  Anxious  Affect:  Appropriate, Congruent, and anxious  Thought Process:  Coherent  Orientation:  Full (Time, Place, and Person)  Thought Content: Logical   Suicidal Thoughts:  No  Homicidal Thoughts:  No  Memory:  Immediate;   Good  Judgement:  Good  Insight:  Good  Psychomotor Activity:  Normal  Concentration:  Concentration: Good and Attention Span: Good  Recall:  Good  Fund of Knowledge: Good  Language: Good  Akathisia:  No  Handed:  Right  AIMS (if indicated): not done  Assets:  Communication Skills Desire for Improvement  ADL's:  Intact  Cognition: WNL  Sleep:  Poor   Screenings: GAD-7    Flowsheet Row Office Visit from 08/06/2023 in [facility] Office Visit from 04/08/2023 in [facility] Primary Care Counselor from 03/24/2023 in [facility] [place] Counselor from 01/31/2023 in [facility] at [place] Counselor from 10/25/2022 in [facility] at [place]  Total GAD-7 Score 17 0 8 7 10       PHQ2-9    Flowsheet Row Office Visit from 08/06/2023 in [facility] Care Office Visit from 04/08/2023 in [facility] Primary Care Counselor from 03/24/2023 in [facility] at [place] Office Visit from 09/04/2022 in [facility] Primary Care Office Visit from 04/03/2022 in [facility] Primary Care  PHQ-2 Total Score 2 0 2 2 2   PHQ-9 Total Score 12 -- 9 9 11  Flowsheet Row Counselor from 03/24/2023 in [facility] at [place] ED from 02/20/2023  in [facility] ED from 05/24/2022 in [facility] at [place]  C-SSRS RISK CATEGORY No Risk No Risk No Risk        Assessment and Plan:  Gabriel [name] is a 48 y.o. year old male with a history of depression, bilateral carpel tunnel syndrome, who presents for follow up appointment for below.   1. MDD (major depressive disorder), recurrent, in partial remission (HCC) 2. Anxiety Acute stressors include:  Other stressors include: unemployment, mistreatment at his previous work, carpel tunnel syndrome, lower back pain, headache, loss of his maternal aunt    History:  not interested in TMS  There is significant worsening in anxiety in the context of stress related to current administration.  He also experiences recurrent distress related to a prior job termination, which occurred during the first administration.  Will uptitrate pregabalin to optimize treatment for anxiety, neuropathic pain while monitoring any drowsiness.  Will continue current dose of duloxetine, mirtazapine, and rexulti to target depression.   3. Insomnia, unspecified type - sleep study was not consistent with sleep apnea in 2023       He continues to experience middle insomnia.  Will make intervention as above to see if it helps for insomnia.    Plan  Continue duloxetine 60 mg daily (limited benefit from higher dose)  Continue mirtazapine 30 mg at night  Continue Rexulti 3 mg at night - monitor drowsiness  Increase pregabalin 75 mg twice a day.  Next appointment: 5/14 at 4 pm for 30 mins , video [email or web address].  - on tramadol   - on topiramate for migraine   Past trials of medication: sertraline, fluoxetine, venlafaxine, mirtazapine, nortriptyline for carpel tunnel syndrome, bupropion (drowsiness), quetiapine (drowsiness), Abilify ("off balance"), Trazodone, Ambien (limited benefit), lorazepam (worsening in anxiety, drowsiness) , temazepam, Lunesta      Collaboration of  Care: Collaboration of Care: Other reviewed notes in Epic  Patient/Guardian was advised Release of Information must be obtained prior to any record release in order to collaborate their care with an outside provider. Patient/Guardian was advised if they have not already done so to contact the registration department to sign all necessary forms in order for [place] to release information regarding their care.   Consent: Patient/Guardian gives verbal consent for treatment and assignment of benefits for services provided during this visit. Patient/Guardian expressed understanding and agreed to proceed.    [name], MD 08/13/2023, 10:00 AM

## 2023-08-11 ENCOUNTER — Encounter: Payer: Self-pay | Admitting: Family Medicine

## 2023-08-11 NOTE — Progress Notes (Signed)
 Gabriel [name]     MRN: [id number]      DOB: 09/28/1975  Chief Complaint  Patient presents with   Depression    Follow up visit     HPI Gabriel [name] is here for follow up and re-evaluation of chronic medical conditions, medication management and review of any available recent lab and radiology data.  Preventive health is updated, specifically  Cancer screening and Immunization.   C/o worsened anxiety and uncontrolled depression, not suicidal or homicidal, treated by Psych ROS Denies recent fever or chills. Denies sinus pressure, nasal congestion, ear pain or sore throat. Denies chest congestion, productive cough or wheezing. Denies chest pains, palpitations and leg swelling Denies abdominal pain, nausea, vomiting,diarrhea or constipation.   Denies dysuria, frequency, hesitancy or incontinence. Denies skin break down or rash.   PE  BP 123/75   Pulse 71   Resp 16   Ht 6' (1.829 m)   Wt 206 lb (93.4 kg)   SpO2 96%   BMI 27.94 kg/m   Patient alert and oriented and in no cardiopulmonary distress.Anxious throughout visit with uncontrollable shaking of hands and feet   HEENT: No facial asymmetry, EOMI,     Neck supple .  Chest: Clear to auscultation bilaterally.  CVS: S1, S2 no murmurs, no S3.Regular rate.  ABD: Soft non tender.   Ext: No edema  MS: Adequate ROM spine, shoulders, hips and knees.  Skin: Intact, no ulcerations or rash noted.  Psych: Good eye contact, extremely  anxious oand mildly depressed appearingdepressed appearing.  CNS: CN 2-12 intact, power,  normal throughout.no focal deficits noted.   Assessment & Plan  Dyslipidemia, goal LDL below 100 Hyperlipidemia:Low fat diet discussed and encouraged.   Lipid Panel  Lab Results  Component Value Date   CHOL 155 09/03/2022   HDL 45 09/03/2022   LDLCALC 97 09/03/2022   TRIG 64 09/03/2022   CHOLHDL 3.4 09/03/2022     Updated lab needed at/ before next visit.       Anxiety Score of 18  check tSH and message psych, worsened and uncontrolled  IGT (impaired glucose tolerance) Patient educated about the importance of limiting  Carbohydrate intake , the need to commit to daily physical activity for a minimum of 30 minutes , and to commit weight loss. The fact that changes in all these areas will reduce or eliminate all together the development of diabetes is stressed.      Latest Ref Rng & Units 04/08/2023   11:22 AM 09/03/2022    8:28 AM 04/02/2022    8:35 AM 09/17/2021   10:24 AM 03/02/2021    9:59 AM  Diabetic Labs  HbA1c 4.8 - 5.6 % 6.3  6.3  6.2  6.3  6.0   Chol 100 - 199 mg/dL  782  956  213    HDL >08 mg/dL  45  43  45    Calc LDL 0 - 99 mg/dL  97  657  846    Triglycerides 0 - 149 mg/dL  64  74  68    Creatinine 0.76 - 1.27 mg/dL 9.62  9.52  8.41  3.24        08/06/2023    9:29 AM 05/02/2023   10:09 AM 04/23/2023   10:14 AM 04/08/2023    9:58 AM 02/20/2023    3:49 PM 12/19/2022    9:56 AM 12/19/2022    9:46 AM  BP/Weight  Systolic BP 123 120 119 114 131 114 113  Diastolic BP 75 88 71 78 85 75 69  Wt. (Lbs) 206 199.2  202     BMI 27.94 kg/m2 27.02 kg/m2  27.4 kg/m2          No data to display          Updated lab needed at/ before next visit.   Depression, major, single episode, severe (HCC) Score of 12 07/2023 Not suicidal or homicidal trreated by Psych will message  Overweight (BMI 25.0-29.9)  Patient re-educated about  the importance of commitment to a  minimum of 150 minutes of exercise per week as able.  The importance of healthy food choices with portion control discussed, as well as eating regularly and within a 12 hour window most days. The need to choose "clean , green" food 50 to 75% of the time is discussed, as well as to make water the primary drink and set a goal of 64 ounces water daily.       08/06/2023    9:29 AM 05/02/2023   10:09 AM 04/08/2023    9:58 AM  Weight /BMI  Weight 206 lb 199 lb 3.2 oz 202 lb  Height 6' (1.829 m)  6' (1.829 m) 6' (1.829 m)  BMI 27.94 kg/m2 27.02 kg/m2 27.4 kg/m2    Deteriorated , needs to reduce suagr intake and commit to regular exercise  Intrinsic atopic dermatitis Controlled, no change in medication

## 2023-08-11 NOTE — Assessment & Plan Note (Signed)
 Score of 12 07/2023 Not suicidal or homicidal trreated by Psych will message

## 2023-08-11 NOTE — Assessment & Plan Note (Signed)
  Patient re-educated about  the importance of commitment to a  minimum of 150 minutes of exercise per week as able.  The importance of healthy food choices with portion control discussed, as well as eating regularly and within a 12 hour window most days. The need to choose "clean , green" food 50 to 75% of the time is discussed, as well as to make water the primary drink and set a goal of 64 ounces water daily.       08/06/2023    9:29 AM 05/02/2023   10:09 AM 04/08/2023    9:58 AM  Weight /BMI  Weight 206 lb 199 lb 3.2 oz 202 lb  Height 6' (1.829 m) 6' (1.829 m) 6' (1.829 m)  BMI 27.94 kg/m2 27.02 kg/m2 27.4 kg/m2    Deteriorated , needs to reduce suagr intake and commit to regular exercise

## 2023-08-11 NOTE — Assessment & Plan Note (Signed)
 Controlled, no change in medication

## 2023-08-13 ENCOUNTER — Telehealth (INDEPENDENT_AMBULATORY_CARE_PROVIDER_SITE_OTHER): Payer: Commercial Managed Care - PPO | Admitting: Psychiatry

## 2023-08-13 ENCOUNTER — Encounter: Payer: Self-pay | Admitting: Psychiatry

## 2023-08-13 ENCOUNTER — Other Ambulatory Visit (HOSPITAL_COMMUNITY): Payer: Self-pay

## 2023-08-13 DIAGNOSIS — F3341 Major depressive disorder, recurrent, in partial remission: Secondary | ICD-10-CM

## 2023-08-13 DIAGNOSIS — F419 Anxiety disorder, unspecified: Secondary | ICD-10-CM | POA: Diagnosis not present

## 2023-08-13 DIAGNOSIS — G4700 Insomnia, unspecified: Secondary | ICD-10-CM | POA: Diagnosis not present

## 2023-08-13 MED ORDER — PREGABALIN 75 MG PO CAPS
75.0000 mg | ORAL_CAPSULE | Freq: Two times a day (BID) | ORAL | 1 refills | Status: DC
Start: 2023-08-13 — End: 2023-11-03
  Filled 2023-08-13: qty 60, 30d supply, fill #0
  Filled 2023-10-09: qty 60, 30d supply, fill #1

## 2023-08-13 NOTE — Patient Instructions (Signed)
 Continue duloxetine 60 mg daily Continue mirtazapine 30 mg at night  Continue Rexulti 3 mg at night Increase pregabalin 75 mg twice a day.  Next appointment: 5/14 at 4 pm

## 2023-08-28 ENCOUNTER — Ambulatory Visit (INDEPENDENT_AMBULATORY_CARE_PROVIDER_SITE_OTHER): Payer: Commercial Managed Care - PPO | Admitting: Psychiatry

## 2023-08-28 DIAGNOSIS — F3341 Major depressive disorder, recurrent, in partial remission: Secondary | ICD-10-CM | POA: Diagnosis not present

## 2023-08-28 NOTE — Progress Notes (Signed)
 Virtual Visit via Video Note  I connected with Gabriel [name] on 08/28/23 at 9:10 AM EDT  by a video enabled telemedicine application and verified that I am speaking with the correct person using two identifiers.  Location: Patient: Home Provider: [facility] Outpatient [place] office    I discussed the limitations of evaluation and management by telemedicine and the availability of in person appointments. The patient expressed understanding and agreed to proceed.  I provided  50  minutes of non-face-to-face time during this encounter.   Dicie Foster, LCSW         THERAPIST PROGRESS NOTE    Session Time:  Thursday 08/28/2023 9:10 AM - 10:00 AM  Participation Level: Active  Treatment Goals addressed:   Improve ability to manage stress and anxiety AEB reducing episodes of anxiety (worry & nervousness) from 4 x per week to 1 x per week for 60 days per pt's self-report . Increase social interaction beyond immediate family to 3-4 x per week per pt's report.  Report a decrease in anxiety symptoms as evidenced by an overall reduction in anxiety score by a minimum of 25% on the Generalized Anxiety Disorder Scale for 2 months.     Progress in treatment:  progressing    Interventions: CBT and Supportive  Summary: Gabriel [name] is a 48 y.o. male who is referred for services by PCP Dr. Alberteen Huge due to patient experiencing symptoms of depression. He denies any psychiatric hospitalizations, He saw therapist Alaine Alken in [place] for a few months. Patient reports symptoms began when he lost his job about a year ago. Per his report, he was fired after he complained about race discrimination. 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 He states now feeling as though he failed his family. He states not wanting to be around anyone and having difficulty engaging  with his wife and children. He fears losing his family.  He reports ruminating thoughts about job and the past, excessive worry depressed mood, tearfulness, anxiety, isolative behaviors, poor motivation, poor concentration, irritability, sleep difficulty, thoughts and feelings of hopelessness and worthlessness. He denies any suicidal ideations.  Patient last was seen via virtual visit about 3 months ago. He reports increased terms of anxiety as reflected in the GAD-7.  Triggers include current political climate, fear of losing benefits, and a recent statement made by patient's wife.  Patient reports this triggered thoughts and feelings related to the loss of his job and the effects on him and his family.  He reported spiraling thoughts and fear of losing his family.  Patient reports he has been trying to use deep breathing, listening to music, and having time for self to calm self as coping techniques.  He still tries to maintain involvement with his family but reports ruminating thoughts.  SI/HI .Nowithout intent/plan  Therapist Response:  reviewed symptoms, minister GAD-7, discussed results, assisted patient identify triggers of increased symptoms of anxiety, praised and reinforced patient's efforts to use helpful coping strategies, encouraged continued use, developed plan with patient to limit his exposure to the news, assisted patient identify connection between thoughts/feelings/behavior, used cognitive diffusion to help patient cope with ruminating thoughts about the past, assisted patient identify and examine his thought patterns regarding recent interaction with wife, identified patterns of emotional reasoning/jumping to conclusions/and mind reading, assisted patient challenge and replace with  more rational thought patterns, discussed his value regarding interaction and relationship with wife and ways to pursue value congruent action,  assisted patient identify ways to improve communication with wife,  developed plan with patient to implement communication strategies and script developed in session to express thoughts and feelings to wife.  Diagnosis: Axis I: MDD, single episode, severe    Axis II: No diagnosis  Collaboration of Care: Psychiatrist AEB patient working with psychiatrist Dr. Edda Goo  Patient/Guardian was advised Release of Information must be obtained prior to any record release in order to collaborate their care with an outside provider. Patient/Guardian was advised if they have not already done so to contact the registration department to sign all necessary forms in order for us  to release information regarding their care.   Consent: Patient/Guardian gives verbal consent for treatment and assignment of benefits for services provided during this visit. Patient/Guardian expressed understanding and agreed to proceed.   Dicie Foster, LCSW 08/28/2023

## 2023-09-08 ENCOUNTER — Other Ambulatory Visit (HOSPITAL_COMMUNITY): Payer: Self-pay

## 2023-09-11 ENCOUNTER — Ambulatory Visit (HOSPITAL_COMMUNITY): Payer: Commercial Managed Care - PPO | Admitting: Psychiatry

## 2023-09-19 NOTE — Progress Notes (Unsigned)
 Virtual Visit via Video Note  I connected with Gabriel [name] on 09/24/23 at  4:00 PM EDT by a video enabled telemedicine application and verified that I am speaking with the correct person using two identifiers.  Location: Patient: home Provider: home office Persons participated in the visit- patient, provider    I discussed the limitations of evaluation and management by telemedicine and the availability of in person appointments. The patient expressed understanding and agreed to proceed.     I discussed the assessment and treatment plan with the patient. The patient was provided an opportunity to ask questions and all were answered. The patient agreed with the plan and demonstrated an understanding of the instructions.   The patient was advised to call back or seek an in-person evaluation if the symptoms worsen or if the condition fails to improve as anticipated.   Todd Fossa, MD    [facility] MD/PA/NP OP Progress Note  09/24/2023 4:44 PM Gabriel [name]  MRN:  [id number]  Chief Complaint:  Chief Complaint  Patient presents with   Follow-up   HPI:  This is a follow-up appointment for depression, anxiety and insomnia.  He excuses himself as he feels tired.  He is having a little more migraine.  He also has vertigo/spinning sensation with nausea when he stands up.  Although he has not noticed much difference since taking higher dose of pregabalin , he states that his anxiety has come down.  Although he continues to think about different things, he has been able to relax to some extent, doing breathing exercise.  Although he agrees that he thinks about things happening the past/the time he lost his job, it has been more manageable.  While he feels down at times, he tries not to stay there.  He tries to think about something else.  He reports good relationship with his family.  He has been working on breathing exercise, which has been helpful.  He tends to have middle insomnia,  sometimes due to pain, and sometimes with no reason.  He denies change in appetite.  He denies SI.  He agrees with the plans as outlined below.   Visit Diagnosis:    ICD-10-CM   1. MDD (major depressive disorder), recurrent, in partial remission (HCC)  F33.41     2. Anxiety  F41.9     3. Insomnia, unspecified type  G47.00       Past Psychiatric History: Please see initial evaluation for full details. I have reviewed the history. No updates at this time.     Past Medical History:  Past Medical History:  Diagnosis Date   Allergy     Anxiety    Carpal tunnel syndrome 2018   Depression    Depression    Phreesia 05/15/2020   Eczema    Elevated LFTs    Heart murmur    Insomnia    Seasonal allergies    Vitamin D  deficiency     Past Surgical History:  Procedure Laterality Date   COLONOSCOPY     2024   NO PAST SURGERIES      Family Psychiatric History: Please see initial evaluation for full details. I have reviewed the history. No updates at this time.     Family History:  Family History  Problem Relation Age of Onset   Allergic rhinitis Mother    Hypertension Mother    Colon polyps Father    Diabetes Father    Hypertension Father    Hypertension Maternal Grandfather    Diabetes  Maternal Grandfather    Cancer Paternal Grandmother        type unknown   Hypertension Paternal Grandmother    Cancer Paternal Grandfather        type unknown   Hypertension Paternal Grandfather    Colon cancer Neg Hx    Esophageal cancer Neg Hx    Rectal cancer Neg Hx    Stomach cancer Neg Hx     Social History:  Social History   Socioeconomic History   Marital status: Married    Spouse name: Not on file   Number of children: 2   Years of education: 12   Highest education level: High school graduate  Occupational History   Occupation: not employed  Tobacco Use   Smoking status: Never    Passive exposure: Never   Smokeless tobacco: Never  Vaping Use   Vaping status: Never  Used  Substance and Sexual Activity   Alcohol use: No    Alcohol/week: 0.0 standard drinks of alcohol   Drug use: No   Sexual activity: Yes    Birth control/protection: None  Other Topics Concern   Not on file  Social History Narrative   He lives with wife and two children.   Unemployed at this present time   Right handed   One story home   Highest level of education:  12th grade   Social Drivers of Health   Financial Resource Strain: Medium Risk (09/08/2017)   Overall Financial Resource Strain (CARDIA)    Difficulty of Paying Living Expenses: Somewhat hard  Food Insecurity: No Food Insecurity (09/08/2017)   Hunger Vital Sign    Worried About Running Out of Food in the Last Year: Never true    Ran Out of Food in the Last Year: Never true  Transportation Needs: No Transportation Needs (09/08/2017)   PRAPARE - Administrator, Civil Service (Medical): No    Lack of Transportation (Non-Medical): No  Physical Activity: Not on file  Stress: Stress Concern Present (09/08/2017)   [employer] of Occupational Health - Occupational Stress Questionnaire    Feeling of Stress : Very much  Social Connections: Not on file    Allergies: No Known Allergies  Metabolic Disorder Labs: Lab Results  Component Value Date   HGBA1C 6.5 (H) 08/06/2023   MPG 137 05/27/2019   MPG 137 12/29/2018   No results found for: "PROLACTIN" Lab Results  Component Value Date   CHOL 155 09/03/2022   TRIG 64 09/03/2022   HDL 45 09/03/2022   CHOLHDL 3.4 09/03/2022   VLDL 10 12/04/2016   LDLCALC 97 09/03/2022   LDLCALC 113 (H) 04/02/2022   Lab Results  Component Value Date   TSH 1.800 08/06/2023   TSH 1.440 09/03/2022    Therapeutic Level Labs: No results found for: "LITHIUM" No results found for: "VALPROATE" No results found for: "CBMZ"  Current Medications: Current Outpatient Medications  Medication Sig Dispense Refill   azelastine  (ASTELIN ) 0.1 % nasal spray Place 1 spray  into both nostrils 2 (two) times daily. Use in each nostril as directed 30 mL 5   Brexpiprazole  (REXULTI ) 3 MG TABS Take 1 tablet (3 mg total) by mouth daily. 90 tablet 1   DULoxetine  (CYMBALTA ) 60 MG capsule Take 1 capsule (60 mg total) by mouth daily. 90 capsule 1   fluticasone  (FLONASE ) 50 MCG/ACT nasal spray Place 1 spray into both nostrils daily. 16 g 5   hydrocortisone  2.5 % cream Apply to face 2 times daily  for 3 weeks, then daily for 3 weeks, then every other daily for 3 weeks, then stop. Repeat cycle as needed. 30 g 3   ketoconazole  (NIZORAL ) 2 % shampoo Apply to face twice weekly and back daily for 1 week, then twice weekly thereafter. 120 mL 11   loratadine  (CLARITIN ) 10 MG tablet Take 10 mg by mouth daily as needed.     meclizine  (ANTIVERT ) 25 MG tablet Take 1 tablet (25 mg total) by mouth 3 (three) times daily as needed for dizziness. 30 tablet 1   mirtazapine  (REMERON ) 30 MG tablet Take 1 tablet (30 mg total) by mouth at bedtime. 90 tablet 1   NONFORMULARY OR COMPOUNDED ITEM Terbinafine 3% Fluconazole 2% Tea Tree Oil 5% Urea 10% Ibuprofen  2% in DMSO Suspension #30ml Apply to affected nails once at bedtime or twice daily     pregabalin  (LYRICA ) 75 MG capsule Take 1 capsule (75 mg total) by mouth 2 (two) times daily. 60 capsule 1   topiramate  (TOPAMAX ) 100 MG tablet Take 1 tablet (100 mg total) by mouth 2 (two) times daily. 180 tablet 1   tretinoin (RETIN-A) 0.025 % cream Apply 1 application topically at bedtime.     Vitamin D , Ergocalciferol , (DRISDOL ) 1.25 MG (50000 UNIT) CAPS capsule Take 1 capsule (50,000 Units total) by mouth every 7 (seven) days. (Patient not taking: Reported on 08/06/2023) 12 capsule 1   No current facility-administered medications for this visit.     Musculoskeletal: Strength & Muscle Tone: N/A Gait & Station: N/A Patient leans: N/A  Psychiatric Specialty Exam: Review of Systems  There were no vitals taken for this visit.There is no height or weight on  file to calculate BMI.  General Appearance: Well Groomed  Eye Contact:  Good  Speech:  Clear and Coherent  Volume:  Normal  Mood:  tired  Affect:  Appropriate, Congruent, and slightly fatigued, tense  Thought Process:  Coherent  Orientation:  Full (Time, Place, and Person)  Thought Content: Logical   Suicidal Thoughts:  No  Homicidal Thoughts:  No  Memory:  Immediate;   Good  Judgement:  Good  Insight:  Good  Psychomotor Activity:  Normal  Concentration:  Concentration: Good and Attention Span: Good  Recall:  Good  Fund of Knowledge: Good  Language: Good  Akathisia:  No  Handed:  Right  AIMS (if indicated): not done  Assets:  Communication Skills Desire for Improvement  ADL's:  Intact  Cognition: WNL  Sleep:  Poor   Screenings: GAD-7    Advertising copywriter from 08/28/2023 in [facility] [place] Office Visit from 08/06/2023 in [facility] Office Visit from 04/08/2023 in [facility] Primary [facility] Counselor from 03/24/2023 in [facility] at [place] Counselor from 01/31/2023 in [facility] at [place]  Total GAD-7 Score 16 17 0 8 7      PHQ2-9    Flowsheet Row Office Visit from 08/06/2023 in [facility] Office Visit from 04/08/2023 in [facility] Counselor from 03/24/2023 in [facility] at [place] Office Visit from 09/04/2022 in [facility] Primary Care Office Visit from 04/03/2022 in [facility] Primary Care  PHQ-2 Total Score 2 0 2 2 2   PHQ-9 Total Score 12 -- 9 9 11       Flowsheet Row Counselor from 03/24/2023 in [facility] [place] [facility] from 02/20/2023 in [facility] from 05/24/2022 in [facility] RISK CATEGORY No Risk No Risk No Risk         Assessment and Plan:  Gabriel [name] is a 48 y.o. year old male with a history of depression, bilateral carpel tunnel syndrome, who presents for follow up appointment for below.   1. MDD (major depressive disorder), recurrent, in partial remission (HCC) 2. Anxiety Acute stressors include:  Other stressors include: unemployment, mistreatment at his previous work, carpel tunnel syndrome, lower back pain, headache, loss of his maternal aunt    History:  not interested in TMS  Although he continues to experience anxiety in the context of stress related to current administration, and recurrent distress related to a prior job termination, which occurred during the first administration, these have been overall manageable, which coincided with recent uptitration of pregabalin .  Although there is a concern of worsening in vertigo/migraine, he feels comfortable to stay on the current dose at this time to target anxiety.  Will continue duloxetine , mirtazapine , rexulti  to target depression.   3. Insomnia, unspecified type - sleep study was not consistent with sleep apnea in 2023       He continues to experience middle insomnia, which he partly attributes to pain.  Will continue to intervene his mood symptoms as outlined above.    Plan  Continue duloxetine  60 mg daily (limited benefit from higher dose)  Continue mirtazapine  30 mg at night  Continue Rexulti  3 mg at night - monitor drowsiness  Continue pregabalin  75 mg twice a day. - refill left Next appointment: 6/24 at 1 30 for 30 mins , video [email or web address].  - on tramadol    - on topiramate  for migraine   Past trials of medication: sertraline , fluoxetine , venlafaxine , mirtazapine , nortriptyline  for carpel tunnel syndrome, bupropion  (drowsiness), quetiapine  (drowsiness), Abilify  ("off balance"), Trazodone , Ambien  (limited benefit), lorazepam  (worsening in anxiety, drowsiness) , temazepam , Lunesta       Collaboration of Care:  Collaboration of Care: Other reviewed notes in Epic  Patient/Guardian was advised Release of Information must be obtained prior to any record release in order to collaborate their care with an outside provider. Patient/Guardian was advised if they have not already done so to contact the registration department to sign all necessary forms in order for us  to release information regarding their care.   Consent: Patient/Guardian gives verbal consent for treatment and assignment of benefits for services provided during this visit. Patient/Guardian expressed understanding and agreed to proceed.    Todd Fossa, MD 09/24/2023, 4:44 PM

## 2023-09-24 ENCOUNTER — Telehealth: Admitting: Psychiatry

## 2023-09-24 ENCOUNTER — Encounter: Payer: Self-pay | Admitting: Psychiatry

## 2023-09-24 DIAGNOSIS — G4700 Insomnia, unspecified: Secondary | ICD-10-CM

## 2023-09-24 DIAGNOSIS — F419 Anxiety disorder, unspecified: Secondary | ICD-10-CM

## 2023-09-24 DIAGNOSIS — F3341 Major depressive disorder, recurrent, in partial remission: Secondary | ICD-10-CM | POA: Diagnosis not present

## 2023-09-24 NOTE — Patient Instructions (Addendum)
 Continue duloxetine  60 mg daily  Continue mirtazapine  30 mg at night  Continue Rexulti  3 mg at night  Continue pregabalin  75 mg twice a day.  Next appointment: 6/24 at 1 30

## 2023-09-25 ENCOUNTER — Ambulatory Visit (HOSPITAL_COMMUNITY): Payer: Commercial Managed Care - PPO | Admitting: Psychiatry

## 2023-09-25 DIAGNOSIS — F419 Anxiety disorder, unspecified: Secondary | ICD-10-CM

## 2023-09-25 DIAGNOSIS — F3341 Major depressive disorder, recurrent, in partial remission: Secondary | ICD-10-CM | POA: Diagnosis not present

## 2023-09-25 NOTE — Progress Notes (Signed)
 Virtual Visit via Video Note  I connected with Gabriel [name] on 09/25/23 at 9:07 AM EDT  by a video enabled telemedicine application and verified that I am speaking with the correct person using two identifiers.  Location: Patient: Home Provider: [facility] Outpatient [place] office    I discussed the limitations of evaluation and management by telemedicine and the availability of in person appointments. The patient expressed understanding and agreed to proceed.  I provided 48  minutes of non-face-to-face time during this encounter.   Dicie Foster, LCSW         THERAPIST PROGRESS NOTE    Session Time:  Thursday 09/25/2023 9:07 AM - 9:55 AM     Participation Level: Active  Treatment Goals addressed:   Improve ability to manage stress and anxiety AEB reducing episodes of anxiety (worry & nervousness) from 4 x per week to 1 x per week for 60 days per pt's self-report . Increase social interaction beyond immediate family to 3-4 x per week per pt's report.  Report a decrease in anxiety symptoms as evidenced by an overall reduction in anxiety score by a minimum of 25% on the Generalized Anxiety Disorder Scale for 2 months.     Progress in treatment:  progressing    Interventions: CBT and Supportive  Summary: Gabriel [name] is a 48 y.o. male who is referred for services by PCP Dr. Alberteen Huge due to patient experiencing symptoms of depression. He denies any psychiatric hospitalizations, He saw therapist Alaine Alken in [place] for a few months. Patient reports symptoms began when he lost his job about a year ago. Per his report, he was fired after he complained about race discrimination. 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 He states now feeling as though he failed his family. He states not wanting to be around anyone and having difficulty  engaging with his wife and children. He fears losing his family.  He reports ruminating thoughts about job and the past, excessive worry depressed mood, tearfulness, anxiety, isolative behaviors, poor motivation, poor concentration, irritability, sleep difficulty, thoughts and feelings of hopelessness and worthlessness. He denies any suicidal ideations.  Patient last was seen via virtual visit about 4 weeks  ago. He reports decreased symptoms of anxiety as reflected in the GAD-7.  However, he reports still worrying about the current political climate and expresses continued fear of losing benefits. He reports increased vertigo and migraines and states resting more along with decreased involvement in activity.  He states  trying to avoid physical activity as he continues to struggle with physical pain.  He reports recently having a conversation with his wife who tried to assure him she is okay per patient's report.  However, he expresses concerns she may not be telling him how she really feels.  He is pleased he verbalized to wife he wants to be supportive.   SI/HI .Nowithout intent/plan  Therapist Response:  reviewed symptoms, minister GAD-7, discussed results, current stressors, facilitated expression of thoughts and feelings, validated feelings praised and reinforced patient's efforts to improve communication with his wife, discussed the role of behavioral activation in coping with anxiety and ruminating thoughts, assisted patient began to identify value congruent activity to try to increase behavioral activation within his capability, will send patient activity menu to assist him in his efforts, developed plan with patient to  use strategies discussed in session  Diagnosis: Axis I: MDD, single episode, severe    Axis II: No diagnosis  Collaboration of Care: Psychiatrist AEB patient working with psychiatrist Dr. Edda Goo  Patient/Guardian was advised Release of Information must be obtained prior to any  record release in order to collaborate their care with an outside provider. Patient/Guardian was advised if they have not already done so to contact the registration department to sign all necessary forms in order for us  to release information regarding their care.   Consent: Patient/Guardian gives verbal consent for treatment and assignment of benefits for services provided during this visit. Patient/Guardian expressed understanding and agreed to proceed.   Dicie Foster, LCSW 09/25/2023

## 2023-10-09 ENCOUNTER — Other Ambulatory Visit (HOSPITAL_COMMUNITY): Payer: Self-pay

## 2023-10-09 ENCOUNTER — Other Ambulatory Visit: Payer: Self-pay

## 2023-10-09 ENCOUNTER — Other Ambulatory Visit: Payer: Self-pay | Admitting: Psychiatry

## 2023-10-09 MED ORDER — REXULTI 3 MG PO TABS
3.0000 mg | ORAL_TABLET | Freq: Every day | ORAL | 1 refills | Status: DC
  Filled 2023-10-09: qty 90, 90d supply, fill #0
  Filled 2024-01-21: qty 90, 90d supply, fill #1

## 2023-10-14 ENCOUNTER — Other Ambulatory Visit (HOSPITAL_COMMUNITY): Payer: Self-pay

## 2023-10-22 ENCOUNTER — Ambulatory Visit: Payer: Commercial Managed Care - PPO | Admitting: Urology

## 2023-10-27 ENCOUNTER — Ambulatory Visit (INDEPENDENT_AMBULATORY_CARE_PROVIDER_SITE_OTHER): Admitting: Psychiatry

## 2023-10-27 DIAGNOSIS — F419 Anxiety disorder, unspecified: Secondary | ICD-10-CM

## 2023-10-27 DIAGNOSIS — F3341 Major depressive disorder, recurrent, in partial remission: Secondary | ICD-10-CM

## 2023-10-27 NOTE — Progress Notes (Signed)
 Virtual Visit via Video Note  I connected with Gabriel [name] on 10/27/23 at 8:10  AM EDT  by a video enabled telemedicine application and verified that I am speaking with the correct person using two identifiers.  Location: Patient: Home Provider: [facility] Outpatient [place] office    I discussed the limitations of evaluation and management by telemedicine and the availability of in person appointments. The patient expressed understanding and agreed to proceed.  I provided  35 minutes of non-face-to-face time during this encounter.   Dicie Foster, LCSW         THERAPIST PROGRESS NOTE    Session Time:  Monday 10/27/2023 8:10 AM  -  8:45 AM  Participation Level: Active  Treatment Goals addressed:   Improve ability to manage stress and anxiety AEB reducing episodes of anxiety (worry & nervousness) from 4 x per week to 1 x per week for 60 days per pt's self-report . Increase social interaction beyond immediate family to 3-4 x per week per pt's report.  Report a decrease in anxiety symptoms as evidenced by an overall reduction in anxiety score by a minimum of 25% on the Generalized Anxiety Disorder Scale for 2 months.     Progress in treatment:  progressing    Interventions: CBT and Supportive  Summary: Gabriel [name] is a 48 y.o. male who is referred for services by PCP Dr. Alberteen Huge due to patient experiencing symptoms of depression. He denies any psychiatric hospitalizations, He saw therapist Alaine Alken in [place] for a few months. Patient reports symptoms began when he lost his job about a year ago. Per his report, he was fired after he complained about race discrimination. 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 He states now feeling as though he failed his family. He states not wanting to be around anyone and having difficulty engaging  with his wife and children. He fears losing his family.  He reports ruminating thoughts about job and the past, excessive worry depressed mood, tearfulness, anxiety, isolative behaviors, poor motivation, poor concentration, irritability, sleep difficulty, thoughts and feelings of hopelessness and worthlessness. He denies any suicidal ideations.  Patient last was seen via virtual visit about 4 weeks ago.  Patient states feeling very good this morning and doing well overall since last session.  He reports decreased anxiety and decreased worry.  He states feeling more calm and relaxed.  He still reports having worry at times but has become more aware of worry thoughts and states he has been shifting his mind to something else to avoid dwelling on the worry.  He also tries to focus on his surroundings rather than getting stuck in his head with worry thoughts per patient's report.  He reports continued involvement in walking, listening to music, and watching TV.  He still practices relaxation techniques.  Patient reports he has been taking out the trash a couple of times per week.  Patient reports feeling positive about the relationship with his wife and no longer expresses any worry regarding this.     SI/HI .Nowithout intent/plan  Therapist Response:  reviewed symptoms, praised and reinforced patient's continued efforts regarding behavioral activation/increased awareness of thoughts/efforts to shift attention, reviewed treatment plan, sent patient signature page and treatment plan review via MyChart, discussed next steps for treatment to include relapse prevention strategies, began to provide  psychoeducation on lapse versus relapse, reiterated the role of behavioral activation and developed plan with patient to use scheduling/activity menu , will send patient handout on mindfulness and the window of tolerance via mail in preparation for next session, discussed stepdown plan for termination to include 3 more  sessions, processed patient's feelings about termination  Diagnosis: Axis I: MDD,     Axis II: No diagnosis  Collaboration of Care: Psychiatrist AEB patient working with psychiatrist Dr. Edda Goo  Patient/Guardian was advised Release of Information must be obtained prior to any record release in order to collaborate their care with an outside provider. Patient/Guardian was advised if they have not already done so to contact the registration department to sign all necessary forms in order for us  to release information regarding their care.   Consent: Patient/Guardian gives verbal consent for treatment and assignment of benefits for services provided during this visit. Patient/Guardian expressed understanding and agreed to proceed.   Dicie Foster, LCSW 10/27/2023

## 2023-10-29 NOTE — Progress Notes (Signed)
 Virtual Visit via Video Note  I connected with Gabriel [name] on 11/03/23 at  1:30 PM EDT by a video enabled telemedicine application and verified that I am speaking with the correct person using two identifiers.  Location: Patient: home Provider: home office Persons participated in the visit- patient, provider    I discussed the limitations of evaluation and management by telemedicine and the availability of in person appointments. The patient expressed understanding and agreed to proceed.  I discussed the assessment and treatment plan with the patient. The patient was provided an opportunity to ask questions and all were answered. The patient agreed with the plan and demonstrated an understanding of the instructions.   The patient was advised to call back or seek an in-person evaluation if the symptoms worsen or if the condition fails to improve as anticipated.   [name], MD    [facility] MD/PA/NP OP Progress Note  11/03/2023 2:05 PM Gabriel [name]  MRN:  [id number]  Chief Complaint:  Chief Complaint  Patient presents with   Follow-up   HPI:  This is a follow-up appointment for depression, anxiety and insomnia.  He states that he has been having more pain in his leg.  It has been nerve-racking, and is progressively worse.  He denies any injury or any triggers.  He has an upcoming appointment with Dr. Antonetta in a few days, and he agrees to have addressed this concern.  He is trying to get easy.  He finds breathing exercise to be very helpful.  He has not taken a walk lately due to the temperature.  He reports fair relationship with his family.  He tends to feel tired.  He also has middle insomnia.  Although he occasionally has pain, he does not have pain on the other occasion.  He started to have nightmares.  It was scary, and uncomfortable.  He was trying to run away from people for tries to harm him.  For some reason they were against him.  He agrees that he feels similar to how  he was feeling when he was struggling with issues at work.  He tries not to think about the past, and it has come across too much.  He is trying not to get sucked into it as it is overwhelming.  He denies feeling depressed.  He denies SI, HI, hallucinations.  He denies alcohol use or drug use.  Although he does feel a little drowsy in the morning, he is unsure if any of his medication is causing this issues.  He cannot tell if any difference since switching from gabapentin  to pregabalin .  He agrees with the plans as outlined below.   Visit Diagnosis:    ICD-10-CM   1. MDD (major depressive disorder), recurrent, in partial remission (HCC)  F33.41     2. Anxiety  F41.9     3. Insomnia, unspecified type  G47.00       Past Psychiatric History: Please see initial evaluation for full details. I have reviewed the history. No updates at this time.     Past Medical History:  Past Medical History:  Diagnosis Date   Allergy     Anxiety    Carpal tunnel syndrome 2018   Depression    Depression    Phreesia 05/15/2020   Eczema    Elevated LFTs    Heart murmur    Insomnia    Seasonal allergies    Vitamin D  deficiency     Past Surgical History:  Procedure Laterality  Date   COLONOSCOPY     2024   NO PAST SURGERIES      Family Psychiatric History: Please see initial evaluation for full details. I have reviewed the history. No updates at this time.     Family History:  Family History  Problem Relation Age of Onset   Allergic rhinitis Mother    Hypertension Mother    Colon polyps Father    Diabetes Father    Hypertension Father    Hypertension Maternal Grandfather    Diabetes Maternal Grandfather    Cancer Paternal Grandmother        type unknown   Hypertension Paternal Grandmother    Cancer Paternal Grandfather        type unknown   Hypertension Paternal Grandfather    Colon cancer Neg Hx    Esophageal cancer Neg Hx    Rectal cancer Neg Hx    Stomach cancer Neg Hx      Social History:  Social History   Socioeconomic History   Marital status: Married    Spouse name: Not on file   Number of children: 2   Years of education: 12   Highest education level: High school graduate  Occupational History   Occupation: not employed  Tobacco Use   Smoking status: Never    Passive exposure: Never   Smokeless tobacco: Never  Vaping Use   Vaping status: Never Used  Substance and Sexual Activity   Alcohol use: No    Alcohol/week: 0.0 standard drinks of alcohol   Drug use: No   Sexual activity: Yes    Birth control/protection: None  Other Topics Concern   Not on file  Social History Narrative   He lives with wife and two children.   Unemployed at this present time   Right handed   One story home   Highest level of education:  12th grade   Social Drivers of Health   Financial Resource Strain: Medium Risk (09/08/2017)   Overall Financial Resource Strain (CARDIA)    Difficulty of Paying Living Expenses: Somewhat hard  Food Insecurity: No Food Insecurity (09/08/2017)   Hunger Vital Sign    Worried About Running Out of Food in the Last Year: Never true    Ran Out of Food in the Last Year: Never true  Transportation Needs: No Transportation Needs (09/08/2017)   PRAPARE - Administrator, Civil Service (Medical): No    Lack of Transportation (Non-Medical): No  Physical Activity: Not on file  Stress: Stress Concern Present (09/08/2017)   [employer] of Occupational Health - Occupational Stress Questionnaire    Feeling of Stress : Very much  Social Connections: Not on file    Allergies: No Known Allergies  Metabolic Disorder Labs: Lab Results  Component Value Date   HGBA1C 6.5 (H) 08/06/2023   MPG 137 05/27/2019   MPG 137 12/29/2018   No results found for: PROLACTIN Lab Results  Component Value Date   CHOL 155 09/03/2022   TRIG 64 09/03/2022   HDL 45 09/03/2022   CHOLHDL 3.4 09/03/2022   VLDL 10 12/04/2016   LDLCALC 97  09/03/2022   LDLCALC 113 (H) 04/02/2022   Lab Results  Component Value Date   TSH 1.800 08/06/2023   TSH 1.440 09/03/2022    Therapeutic Level Labs: No results found for: LITHIUM No results found for: VALPROATE No results found for: CBMZ  Current Medications: Current Outpatient Medications  Medication Sig Dispense Refill   prazosin (MINIPRESS) 1  MG capsule Take 1 capsule (1 mg total) by mouth at bedtime. 30 capsule 1   azelastine  (ASTELIN ) 0.1 % nasal spray Place 1 spray into both nostrils 2 (two) times daily. Use in each nostril as directed 30 mL 5   Brexpiprazole  (REXULTI ) 3 MG TABS Take 1 tablet (3 mg total) by mouth daily. 90 tablet 1   DULoxetine  (CYMBALTA ) 60 MG capsule Take 1 capsule (60 mg total) by mouth daily. 90 capsule 1   fluticasone  (FLONASE ) 50 MCG/ACT nasal spray Place 1 spray into both nostrils daily. 16 g 5   hydrocortisone  2.5 % cream Apply to face 2 times daily for 3 weeks, then daily for 3 weeks, then every other daily for 3 weeks, then stop. Repeat cycle as needed. 30 g 3   ketoconazole  (NIZORAL ) 2 % shampoo Apply to face twice weekly and back daily for 1 week, then twice weekly thereafter. 120 mL 11   loratadine  (CLARITIN ) 10 MG tablet Take 10 mg by mouth daily as needed.     meclizine  (ANTIVERT ) 25 MG tablet Take 1 tablet (25 mg total) by mouth 3 (three) times daily as needed for dizziness. 30 tablet 1   mirtazapine  (REMERON ) 30 MG tablet Take 1 tablet (30 mg total) by mouth at bedtime. 90 tablet 1   NONFORMULARY OR COMPOUNDED ITEM Terbinafine 3% Fluconazole 2% Tea Tree Oil 5% Urea 10% Ibuprofen  2% in DMSO Suspension #30ml Apply to affected nails once at bedtime or twice daily     [START ON 11/13/2023] pregabalin  (LYRICA ) 75 MG capsule Take 1 capsule (75 mg total) by mouth 2 (two) times daily. 60 capsule 0   topiramate  (TOPAMAX ) 100 MG tablet Take 1 tablet (100 mg total) by mouth 2 (two) times daily. 180 tablet 1   tretinoin (RETIN-A) 0.025 % cream Apply 1  application topically at bedtime.     Vitamin D , Ergocalciferol , (DRISDOL ) 1.25 MG (50000 UNIT) CAPS capsule Take 1 capsule (50,000 Units total) by mouth every 7 (seven) days. (Patient not taking: Reported on 08/06/2023) 12 capsule 1   No current facility-administered medications for this visit.     Musculoskeletal: Strength & Muscle Tone: N/A Gait & Station: N/A Patient leans: N/A  Psychiatric Specialty Exam: Review of Systems  Psychiatric/Behavioral:  Positive for sleep disturbance. Negative for agitation, behavioral problems, confusion, decreased concentration, dysphoric mood, hallucinations, self-injury and suicidal ideas. The patient is nervous/anxious. The patient is not hyperactive.   All other systems reviewed and are negative.   There were no vitals taken for this visit.There is no height or weight on file to calculate BMI.  General Appearance: Well Groomed, sitting uncomfortably in the chair due to pain  Eye Contact:  Good  Speech:  Clear and Coherent  Volume:  Normal  Mood:  Anxious  Affect:  Appropriate, Congruent, and calm  Thought Process:  Coherent  Orientation:  Full (Time, Place, and Person)  Thought Content: Logical   Suicidal Thoughts:  No  Homicidal Thoughts:  No  Memory:  Immediate;   Good  Judgement:  Good  Insight:  Good  Psychomotor Activity:  Normal  Concentration:  Concentration: Good and Attention Span: Good  Recall:  Good  Fund of Knowledge: Good  Language: Good  Akathisia:  No  Handed:  Right  AIMS (if indicated): not done  Assets:  Communication Skills Desire for Improvement  ADL's:  Intact  Cognition: WNL  Sleep:  Poor   Screenings: GAD-7    Flowsheet Row Counselor from 09/25/2023 in [place]  [facility] from 08/28/2023 in [facility] [place] Office Visit from 08/06/2023 in [facility] Office Visit from 04/08/2023 in [facility]  Primary Care Counselor from 03/24/2023 in [facility] at [place]  Total GAD-7 Score 9 16 17  0 8   PHQ2-9    Flowsheet Row Office Visit from 08/06/2023 in [facility] Office Visit from 04/08/2023 in [facility] Primary Care Counselor from 03/24/2023 in [facility] at [place] Office Visit from 09/04/2022 in [facility] Office Visit from 04/03/2022 in [facility] Primary Care  PHQ-2 Total Score 2 0 2 2 2   PHQ-9 Total Score 12 -- 9 9 11    Flowsheet Row Counselor from 03/24/2023 in [facility] [place] [facility] from 02/20/2023 in [facility] [place] [facility] from 05/24/2022 in [facility] at [place]  C-SSRS RISK CATEGORY No Risk No Risk No Risk     Assessment and Plan:  Gabriel [name] is a 48 y.o. year old male with a history of depression, bilateral carpel tunnel syndrome, who presents for follow up appointment for below.   1. MDD (major depressive disorder), recurrent, in partial remission (HCC) 2. Anxiety R/o PTSD The patient experiences chronic pain, which significantly impacts his daily functioning. Socially, he has been unemployed since being laid off, an event he perceives as related to racial discrimination. In childhood, he was raised by his mother, who instilled in him values of love and respect. History:  not interested in TMS  He reports PTSD symptoms of nightmares, intrusive thoughts, avoidance related to a prior job termination.  Will start prazosin to target nightmares, and hyperarousal symptoms related to PTSD.  Discussed potential risk of drowsiness and orthostatic hypotension.  Will maintain on the current dose of pregabalin  to target anxiety, neuropathic pain.  The dose is not uptitrate at this time due to possible drowsiness from this medication.  Will continue duloxetine , mirtazapine , and  rexulti  to target depression given his depression has been relatively manageable lately.  He will continue to see Ms. Bynum for therapy.   3. Insomnia, unspecified type - sleep study was not consistent with sleep apnea in 2023     He continues to experience middle insomnia, and started to have nightmares.  Will start prazosin as outlined above.    Plan  Continue duloxetine  60 mg daily (limited benefit from higher dose)  Continue mirtazapine  30 mg at night  Continue Rexulti  3 mg at night - monitor drowsiness  Continue pregabalin  75 mg twice a day. - refill left Start prazosin 1 mg at night  Next appointment: 8/1 at 10 am for 30 mins , video [email or web address].  - on tramadol    - on topiramate  for migraine   Past trials of medication: sertraline , fluoxetine , venlafaxine , mirtazapine , nortriptyline  for carpel tunnel syndrome, bupropion  (drowsiness), quetiapine  (drowsiness), Abilify  (off balance), Trazodone , Ambien  (limited benefit), lorazepam  (worsening in anxiety, drowsiness) , temazepam , Lunesta       Collaboration of Care: Collaboration of Care: Other reviewed notes in Epic  Patient/Guardian was advised Release of Information must be obtained prior to any record release in order to collaborate their care with an outside provider. Patient/Guardian was advised if they have not already done so to contact the registration department to sign all necessary forms in order for us  to release information regarding their care.   Consent: Patient/Guardian  gives verbal consent for treatment and assignment of benefits for services provided during this visit. Patient/Guardian expressed understanding and agreed to proceed.    [name], MD 11/03/2023, 2:05 PM

## 2023-11-03 ENCOUNTER — Other Ambulatory Visit (HOSPITAL_COMMUNITY): Payer: Self-pay

## 2023-11-03 ENCOUNTER — Telehealth (INDEPENDENT_AMBULATORY_CARE_PROVIDER_SITE_OTHER): Admitting: Psychiatry

## 2023-11-03 ENCOUNTER — Encounter: Payer: Self-pay | Admitting: Psychiatry

## 2023-11-03 DIAGNOSIS — F3341 Major depressive disorder, recurrent, in partial remission: Secondary | ICD-10-CM

## 2023-11-03 DIAGNOSIS — G4700 Insomnia, unspecified: Secondary | ICD-10-CM | POA: Diagnosis not present

## 2023-11-03 DIAGNOSIS — F419 Anxiety disorder, unspecified: Secondary | ICD-10-CM

## 2023-11-03 MED ORDER — PRAZOSIN HCL 1 MG PO CAPS
1.0000 mg | ORAL_CAPSULE | Freq: Every day | ORAL | 1 refills | Status: DC
  Filled 2023-11-03: qty 30, 30d supply, fill #0

## 2023-11-03 MED ORDER — PREGABALIN 75 MG PO CAPS
75.0000 mg | ORAL_CAPSULE | Freq: Two times a day (BID) | ORAL | 0 refills | Status: DC
  Filled 2023-11-03 – 2024-01-21 (×2): qty 60, 30d supply, fill #0

## 2023-11-03 NOTE — Patient Instructions (Signed)
 Continue duloxetine  60 mg daily Continue mirtazapine  30 mg at night  Continue Rexulti  3 mg at night  Continue pregabalin  75 mg twice a day. Start prazosin 1 mg at night  Next appointment: 8/1 at 10 am

## 2023-11-07 ENCOUNTER — Ambulatory Visit (INDEPENDENT_AMBULATORY_CARE_PROVIDER_SITE_OTHER): Admitting: Family Medicine

## 2023-11-07 ENCOUNTER — Other Ambulatory Visit (HOSPITAL_COMMUNITY): Payer: Self-pay

## 2023-11-07 ENCOUNTER — Encounter: Payer: Self-pay | Admitting: Family Medicine

## 2023-11-07 VITALS — BP 113/72 | HR 73 | Resp 18 | Ht 72.0 in | Wt 207.1 lb

## 2023-11-07 DIAGNOSIS — Z0001 Encounter for general adult medical examination with abnormal findings: Secondary | ICD-10-CM

## 2023-11-07 DIAGNOSIS — R7303 Prediabetes: Secondary | ICD-10-CM | POA: Diagnosis not present

## 2023-11-07 DIAGNOSIS — E785 Hyperlipidemia, unspecified: Secondary | ICD-10-CM

## 2023-11-07 DIAGNOSIS — G43011 Migraine without aura, intractable, with status migrainosus: Secondary | ICD-10-CM

## 2023-11-07 DIAGNOSIS — E559 Vitamin D deficiency, unspecified: Secondary | ICD-10-CM | POA: Diagnosis not present

## 2023-11-07 DIAGNOSIS — Z23 Encounter for immunization: Secondary | ICD-10-CM | POA: Diagnosis not present

## 2023-11-07 DIAGNOSIS — B360 Pityriasis versicolor: Secondary | ICD-10-CM | POA: Diagnosis not present

## 2023-11-07 MED ORDER — CLOTRIMAZOLE-BETAMETHASONE 1-0.05 % EX CREA
1.0000 | TOPICAL_CREAM | Freq: Two times a day (BID) | CUTANEOUS | 1 refills | Status: DC
  Filled 2023-11-07: qty 45, 30d supply, fill #0

## 2023-11-07 MED ORDER — TOPIRAMATE 100 MG PO TABS
100.0000 mg | ORAL_TABLET | Freq: Every day | ORAL | 3 refills | Status: AC
  Filled 2023-11-07 – 2024-01-21 (×2): qty 90, 90d supply, fill #0
  Filled 2024-06-03: qty 90, 90d supply, fill #1

## 2023-11-07 NOTE — Progress Notes (Signed)
   Gabriel [name]     MRN: [id number]      DOB: 07-09-1975  Chief Complaint  Patient presents with   Annual Exam    HPI: Patient is in for annual physical exam. No other health concerns are expressed or addressed at the visit. Recent labs, if available are reviewed. Immunization is reviewed , and  updated if needed.    PE; BP 113/72   Pulse 73   Resp 18   Ht 6' (1.829 m)   Wt 207 lb 1.3 oz (93.9 kg)   SpO2 96%   BMI 28.09 kg/m   Pleasant male, alert and oriented x 3, in no cardio-pulmonary distress. Afebrile. HEENT No facial trauma or asymetry. Sinuses non tender. EOMI External ears normal,  Neck: supple, no adenopathy,JVD or thyromegaly.No bruits.  Chest: Clear to ascultation bilaterally.No crackles or wheezes. Non tender to palpation  Cardiovascular system; Heart sounds normal,  S1 and  S2 ,no S3.  No murmur, or thrill. Apical beat not displaced Peripheral pulses normal.  Abdomen: Soft, non tender, no organomegaly or masses. No bruits. Bowel sounds normal. No guarding, tenderness or rebound.    Musculoskeletal exam: Full ROM of spine, hips , shoulders and knees. No deformity ,swelling or crepitus noted. No muscle wasting or atrophy.   Neurologic: Cranial nerves 2 to 12 intact. Power, tone ,sensation and reflexes normal throughout. No disturbance in gait. No tremor.  Skin: Intact, scaling  rash noted.on neck Pigmentation normal throughout  Psych; Severe depression improving and severe anxiety under Psych management.   Assessment & Plan:  Annual visit for general adult medical examination with abnormal findings Annual exam as documented. Counseling done  re healthy lifestyle involving commitment to 150 minutes exercise per week, heart healthy diet, and attaining healthy weight.The importance of adequate sleep also discussed. Regular seat belt use and home safety, is also discussed. Changes in health habits are decided on by the patient with  goals and time frames  set for achieving them. Immunization and cancer screening needs are specifically addressed at this visit.   Tinea versicolor Current flare on neck posterior and nape, clotrimazole/betamethasone prescribed  Immunization due After obtaining informed consent, the  hepatitis B #1 vaccine is  administered , with no adverse effect noted at the time of administration.

## 2023-11-07 NOTE — Assessment & Plan Note (Signed)

## 2023-11-07 NOTE — Assessment & Plan Note (Signed)
 Current flare on neck posterior and nape, clotrimazole/betamethasone prescribed

## 2023-11-07 NOTE — Assessment & Plan Note (Signed)
 After obtaining informed consent, the  hepatitis B #1 vaccine is  administered , with no adverse effect noted at the time of administration.

## 2023-11-07 NOTE — Patient Instructions (Addendum)
 F/u in 4 months,call if you need me sooner  HepB#1today  Nurse visit in 2 months for Hep B #2  Lipid,cmp and EGFR, hBA1C and vit D today  If HBA1C is 6.5 or mre you are diabetic  I will refer you to diabetic edicator and also satrt metformin 500mg  tablet   It is important that you exercise regularly at least 30 minutes 5 times a week. If you develop chest pain, have severe difficulty breathing, or feel very tired, stop exercising immediately and seek medical attention   Aim for vegetables and fruit to be mainstay of your diet for best health

## 2023-11-08 ENCOUNTER — Ambulatory Visit: Payer: Self-pay | Admitting: Family Medicine

## 2023-11-08 LAB — LIPID PANEL
Chol/HDL Ratio: 3.7 ratio (ref 0.0–5.0)
Cholesterol, Total: 172 mg/dL (ref 100–199)
HDL: 46 mg/dL (ref >39)
LDL Chol Calc (NIH): 115 mg/dL — ABNORMAL HIGH (ref 0–99)
Triglycerides: 55 mg/dL (ref 0–149)
VLDL Cholesterol Cal: 11 mg/dL (ref 5–40)

## 2023-11-08 LAB — CMP14+EGFR
ALT: 55 IU/L — ABNORMAL HIGH (ref 0–44)
AST: 30 IU/L (ref 0–40)
Albumin: 4.6 g/dL (ref 4.1–5.1)
Alkaline Phosphatase: 102 IU/L (ref 44–121)
BUN/Creatinine Ratio: 18 (ref 9–20)
BUN: 19 mg/dL (ref 6–24)
Bilirubin Total: 0.4 mg/dL (ref 0.0–1.2)
CO2: 23 mmol/L (ref 20–29)
Calcium: 9.4 mg/dL (ref 8.7–10.2)
Chloride: 105 mmol/L (ref 96–106)
Creatinine, Ser: 1.04 mg/dL (ref 0.76–1.27)
Globulin, Total: 2.5 g/dL (ref 1.5–4.5)
Glucose: 94 mg/dL (ref 70–99)
Potassium: 4.2 mmol/L (ref 3.5–5.2)
Sodium: 143 mmol/L (ref 134–144)
Total Protein: 7.1 g/dL (ref 6.0–8.5)
eGFR: 89 mL/min/{1.73_m2} (ref >59)

## 2023-11-08 LAB — HEMOGLOBIN A1C
Est. average glucose Bld gHb Est-mCnc: 137 mg/dL
Hgb A1c MFr Bld: 6.4 % — ABNORMAL HIGH (ref 4.8–5.6)

## 2023-11-08 LAB — VITAMIN D 25 HYDROXY (VIT D DEFICIENCY, FRACTURES): Vit D, 25-Hydroxy: 33.2 ng/mL (ref 30.0–100.0)

## 2023-12-06 NOTE — Progress Notes (Unsigned)
 Virtual Visit via Video Note  I connected with Gabriel [name] on 12/12/23 at 10:00 AM EDT by a video enabled telemedicine application and verified that I am speaking with the correct person using two identifiers.  Location: Patient: home Provider: home office Persons participated in the visit- patient, provider    I discussed the limitations of evaluation and management by telemedicine and the availability of in person appointments. The patient expressed understanding and agreed to proceed.     I discussed the assessment and treatment plan with the patient. The patient was provided an opportunity to ask questions and all were answered. The patient agreed with the plan and demonstrated an understanding of the instructions.   The patient was advised to call back or seek an in-person evaluation if the symptoms worsen or if the condition fails to improve as anticipated.    [name], MD    [facility] MD/PA/NP OP Progress Note  12/12/2023 10:36 AM Gabriel [name]  MRN:  [id number]  Chief Complaint:  Chief Complaint  Patient presents with   Follow-up   HPI:  This is a follow-up appointment for depression, anxiety and insomnia.  He states that he has been feeling irritated.  He has most tingling sensation in his leg and hand.  Although he used to be seen by Dr. Milton, the clinic is closed and he is consenting to be seen by a neurologist in [place].  His wife had cholecystectomy.  Although he was concerned, it went well, and he denies feeling overwhelmed related to this.  He is unsure if prazosin  has helped, although he does not have any dream anymore.  He struggled with some dizziness, although it has not happened for the last few days.  He reports irritability due to pain.  He states that he got hurt during the job.  He also has carpal tunnel due to work.  He agrees that it reminds him of how he was treated by the work.  He has intrusive thoughts about these.  He has insomnia.  He  denies change in appetite.  He denies SI, HI, hallucinations.  He agrees with the plans as outlined below.    Wt Readings from Last 3 Encounters:  11/07/23 207 lb 1.3 oz (93.9 kg)  08/06/23 206 lb (93.4 kg)  05/02/23 199 lb 3.2 oz (90.4 kg)     Visit Diagnosis:    ICD-10-CM   1. MDD (major depressive disorder), recurrent, in partial remission (HCC)  F33.41     2. Anxiety  F41.9     3. Insomnia, unspecified type  G47.00       Past Psychiatric History: Please see initial evaluation for full details. I have reviewed the history. No updates at this time.     Past Medical History:  Past Medical History:  Diagnosis Date   Allergy     Anxiety    Carpal tunnel syndrome 2018   Depression    Depression    Phreesia 05/15/2020   Eczema    Elevated LFTs    Heart murmur    Insomnia    Seasonal allergies    Vitamin D  deficiency     Past Surgical History:  Procedure Laterality Date   COLONOSCOPY     2024   NO PAST SURGERIES      Family Psychiatric History: Please see initial evaluation for full details. I have reviewed the history. No updates at this time.     Family History:  Family History  Problem Relation Age of Onset  Allergic rhinitis Mother    Hypertension Mother    Colon polyps Father    Diabetes Father    Hypertension Father    Hypertension Maternal Grandfather    Diabetes Maternal Grandfather    Cancer Paternal Grandmother        type unknown   Hypertension Paternal Grandmother    Cancer Paternal Grandfather        type unknown   Hypertension Paternal Grandfather    Colon cancer Neg Hx    Esophageal cancer Neg Hx    Rectal cancer Neg Hx    Stomach cancer Neg Hx     Social History:  Social History   Socioeconomic History   Marital status: Married    Spouse name: Not on file   Number of children: 2   Years of education: 12   Highest education level: High school graduate  Occupational History   Occupation: not employed  Tobacco Use   Smoking  status: Never    Passive exposure: Never   Smokeless tobacco: Never  Vaping Use   Vaping status: Never Used  Substance and Sexual Activity   Alcohol use: No    Alcohol/week: 0.0 standard drinks of alcohol   Drug use: No   Sexual activity: Yes    Birth control/protection: None  Other Topics Concern   Not on file  Social History Narrative   He lives with wife and two children.   Unemployed at this present time   Right handed   One story home   Highest level of education:  12th grade   Social Drivers of Health   Financial Resource Strain: Medium Risk (09/08/2017)   Overall Financial Resource Strain (CARDIA)    Difficulty of Paying Living Expenses: Somewhat hard  Food Insecurity: No Food Insecurity (09/08/2017)   Hunger Vital Sign    Worried About Running Out of Food in the Last Year: Never true    Ran Out of Food in the Last Year: Never true  Transportation Needs: No Transportation Needs (09/08/2017)   PRAPARE - Administrator, Civil Service (Medical): No    Lack of Transportation (Non-Medical): No  Physical Activity: Not on file  Stress: Stress Concern Present (09/08/2017)   [employer] of Occupational Health - Occupational Stress Questionnaire    Feeling of Stress : Very much  Social Connections: Not on file    Allergies: No Known Allergies  Metabolic Disorder Labs: Lab Results  Component Value Date   HGBA1C 6.4 (H) 11/07/2023   MPG 137 05/27/2019   MPG 137 12/29/2018   No results found for: PROLACTIN Lab Results  Component Value Date   CHOL 172 11/07/2023   TRIG 55 11/07/2023   HDL 46 11/07/2023   CHOLHDL 3.7 11/07/2023   VLDL 10 12/04/2016   LDLCALC 115 (H) 11/07/2023   LDLCALC 97 09/03/2022   Lab Results  Component Value Date   TSH 1.800 08/06/2023   TSH 1.440 09/03/2022    Therapeutic Level Labs: No results found for: LITHIUM No results found for: VALPROATE No results found for: CBMZ  Current Medications: Current  Outpatient Medications  Medication Sig Dispense Refill   Brexpiprazole  (REXULTI ) 3 MG TABS Take 1 tablet (3 mg total) by mouth daily. 90 tablet 1   clotrimazole -betamethasone  (LOTRISONE ) cream Apply 1 Application topically 2 (two) times daily. 45 g 1   DULoxetine  (CYMBALTA ) 60 MG capsule Take 1 capsule (60 mg total) by mouth daily. 90 capsule 1   fluticasone  (FLONASE ) 50 MCG/ACT nasal  spray Place 1 spray into both nostrils daily. 16 g 5   hydrocortisone  2.5 % cream Apply to face 2 times daily for 3 weeks, then daily for 3 weeks, then every other daily for 3 weeks, then stop. Repeat cycle as needed. 30 g 3   ketoconazole  (NIZORAL ) 2 % shampoo Apply to face twice weekly and back daily for 1 week, then twice weekly thereafter. 120 mL 11   loratadine  (CLARITIN ) 10 MG tablet Take 10 mg by mouth daily as needed.     meclizine  (ANTIVERT ) 25 MG tablet Take 1 tablet (25 mg total) by mouth 3 (three) times daily as needed for dizziness. 30 tablet 1   mirtazapine  (REMERON ) 30 MG tablet Take 1 tablet (30 mg total) by mouth at bedtime. 90 tablet 1   NONFORMULARY OR COMPOUNDED ITEM Terbinafine 3% Fluconazole 2% Tea Tree Oil 5% Urea 10% Ibuprofen  2% in DMSO Suspension #30ml Apply to affected nails once at bedtime or twice daily     pregabalin  (LYRICA ) 75 MG capsule Take 1 capsule (75 mg total) by mouth 2 (two) times daily. 60 capsule 0   topiramate  (TOPAMAX ) 100 MG tablet Take 1 tablet (100 mg total) by mouth daily. 90 tablet 3   tretinoin (RETIN-A) 0.025 % cream Apply 1 application topically at bedtime.     No current facility-administered medications for this visit.     Musculoskeletal: Strength & Muscle Tone: N/A Gait & Station: N/A Patient leans: N/A  Psychiatric Specialty Exam: Review of Systems  Psychiatric/Behavioral:  Positive for dysphoric mood and sleep disturbance. Negative for agitation, behavioral problems, confusion, decreased concentration, hallucinations, self-injury and suicidal ideas.  The patient is nervous/anxious. The patient is not hyperactive.   All other systems reviewed and are negative.   There were no vitals taken for this visit.There is no height or weight on file to calculate BMI.  General Appearance: Well Groomed  Eye Contact:  Good  Speech:  Clear and Coherent  Volume:  Normal  Mood:  Irritable  Affect:  Appropriate, Congruent, and Restricted  Thought Process:  Coherent  Orientation:  Full (Time, Place, and Person)  Thought Content: Logical   Suicidal Thoughts:  No  Homicidal Thoughts:  No  Memory:  Immediate;   Good  Judgement:  Good  Insight:  Good  Psychomotor Activity:  Normal  Concentration:  Concentration: Good and Attention Span: Good  Recall:  Good  Fund of Knowledge: Good  Language: Good  Akathisia:  No  Handed:  Right  AIMS (if indicated): not done  Assets:  Communication Skills Desire for Improvement  ADL's:  Intact  Cognition: WNL  Sleep:  Poor   Screenings: GAD-7    Flowsheet Row Office Visit from 11/07/2023 in [facility] Counselor from 09/25/2023 in [facility] [place] Counselor from 08/28/2023 in [facility] at [place] Office Visit from 08/06/2023 in [facility] Office Visit from 04/08/2023 in [facility]  Total GAD-7 Score 16 9 16 17  0   PHQ2-9    Flowsheet Row Office Visit from 11/07/2023 in [facility] Office Visit from 08/06/2023 in [facility] Office Visit from 04/08/2023 in [facility] Counselor from 03/24/2023 in [facility] at [place] Office Visit from 09/04/2022 in [facility] Primary Care  PHQ-2 Total Score 2 2 0 2 2  PHQ-9 Total Score 14 12 -- 9 9  Flowsheet Row Counselor from 03/24/2023 in [facility] [place] [facility] from 02/20/2023 in [facility] [place] [facility] from 05/24/2022 in [facility] [place]  C-SSRS RISK CATEGORY No Risk No Risk No Risk     Assessment and Plan:  Gabriel [name] is a 48 y.o. year old male with a history of depression, bilateral carpel tunnel syndrome, who presents for follow up appointment for below.   1. MDD (major depressive disorder), recurrent, in partial remission (HCC) 2. Anxiety # r/o PTSD The patient experiences chronic pain, which significantly impacts his daily functioning. Socially, he has been unemployed since being laid off, an event he perceives as related to racial discrimination. In childhood, he was raised by his mother, who instilled in him values of love and respect. History:  not interested in TMS  He reports worsening in irritability in the context of worsening and neuropathic pain, which causes to reexperience of trauma.  Although he reports improvement in dreams, which coincided with starting prazosin , he reports concern of slight worsening in drowsiness, which has been subsided for the past few days.  He agrees to withhold medication to see if it makes any difference.  Will continue current dose of duloxetine  to target depression and anxiety, neuropathic pain.  Will continue mirtazapine  and rexulti  as adjunctive treatment for depression.  Will continue pregabalin  for anxiety and neuropathic pain.  It was noted that he is planning to schedule an appointment with a new neurologist in [place].  3. Insomnia, unspecified type  - sleep study was not consistent with sleep apnea in 2023     Unstable.  Although he reports improvement in dream, he experiences drowsiness from prazosin .  Will hold this medication at this time.  May consider orexin antagonist or nortripytine in the future.    Plan  (he will contact the clinic to notify the new pharmacy they use in [place]) Continue duloxetine  60 mg daily (limited benefit from higher dose)  Continue mirtazapine  30  mg at night  Continue Rexulti  3 mg at night - monitor drowsiness  Continue pregabalin  75 mg twice a day. - refill left Discontinue prazosin - concern of dizziness  Next appointment: 9/19 at 10 am for 30 mins , video [email or web address].  - on tramadol    - on topiramate  for migraine   Past trials of medication: sertraline , fluoxetine , venlafaxine , mirtazapine , nortriptyline  for carpel tunnel syndrome, bupropion  (drowsiness), quetiapine  (drowsiness), Abilify  (off balance), Trazodone , Ambien  (limited benefit),  temazepam , Lunesta , lorazepam  (worsening in anxiety, drowsiness) , prazosin  (drowsiness)    The patient demonstrates the following risk factors for suicide: Chronic risk factors for suicide include: psychiatric disorder of depression. Acute risk factors for suicide include: unemployment and loss (financial, interpersonal, professional). Protective factors for this patient include: positive social support, responsibility to others (children, family) and hope for the future. Considering these factors, the overall suicide risk at this point appears to be low. Patient is appropriate for outpatient follow up.   Collaboration of Care: Collaboration of Care: Other reviewed notes in Epic  Patient/Guardian was advised Release of Information must be obtained prior to any record release in order to collaborate their care with an outside provider. Patient/Guardian was advised if they have not already done so to contact the registration department to sign all necessary forms in order for us  to release information regarding their care.   Consent: Patient/Guardian gives verbal consent for treatment and assignment of benefits for services provided during this visit. Patient/Guardian expressed  understanding and agreed to proceed.    [name], MD 12/12/2023, 10:36 AM

## 2023-12-12 ENCOUNTER — Encounter: Payer: Self-pay | Admitting: Psychiatry

## 2023-12-12 ENCOUNTER — Telehealth: Admitting: Psychiatry

## 2023-12-12 DIAGNOSIS — G4700 Insomnia, unspecified: Secondary | ICD-10-CM

## 2023-12-12 DIAGNOSIS — F3341 Major depressive disorder, recurrent, in partial remission: Secondary | ICD-10-CM

## 2023-12-12 DIAGNOSIS — F419 Anxiety disorder, unspecified: Secondary | ICD-10-CM

## 2023-12-12 NOTE — Patient Instructions (Signed)
 Continue duloxetine  60 mg daily  Continue mirtazapine  30 mg at night  Continue Rexulti  3 mg at night  Continue pregabalin  75 mg twice a day.  Discontinue prazosin  Next appointment: 9/19 at 10 am

## 2023-12-24 ENCOUNTER — Ambulatory Visit (HOSPITAL_COMMUNITY): Admitting: Psychiatry

## 2023-12-24 DIAGNOSIS — F3341 Major depressive disorder, recurrent, in partial remission: Secondary | ICD-10-CM | POA: Diagnosis not present

## 2023-12-24 NOTE — Progress Notes (Signed)
 Virtual Visit via Video Note  I connected with Gabriel [name] on 12/24/23 at 10:10 AM EDT  by a video enabled telemedicine application and verified that I am speaking with the correct person using two identifiers.  Location: Patient: Home Provider: [facility] Outpatient [place] office    I discussed the limitations of evaluation and management by telemedicine and the availability of in person appointments. The patient expressed understanding and agreed to proceed.  I provided 53 minutes of non-face-to-face time during this encounter.   Winton FORBES Rubinstein, LCSW         THERAPIST PROGRESS NOTE    Session Time:  Wednesday 12/24/2023 10:10 AM - 11:03 AM   Participation Level: Active  Treatment Goals addressed:   Improve ability to manage stress and anxiety AEB reducing episodes of anxiety (worry & nervousness) from 4 x per week to 1 x per week for 60 days per pt's self-report . Increase social interaction beyond immediate family to 3-4 x per week per pt's report.  Report a decrease in anxiety symptoms as evidenced by an overall reduction in anxiety score by a minimum of 25% on the Generalized Anxiety Disorder Scale for 2 months.     Progress in treatment:  progressing    Interventions: CBT and Supportive  Summary: Gabriel [name] is a 48 y.o. male who is referred for services by PCP Dr. Rollene Pesa due to patient experiencing symptoms of depression. He denies any psychiatric hospitalizations, He saw therapist [facility] in [place] for a few months. Patient reports symptoms began when he lost his job about a year ago. Per his report, he was fired after he complained about race discrimination. 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 He states now feeling as though he failed his family. He states not wanting to be around anyone and having difficulty  engaging with his wife and children. He fears losing his family.  He reports ruminating thoughts about job and the past, excessive worry depressed mood, tearfulness, anxiety, isolative behaviors, poor motivation, poor concentration, irritability, sleep difficulty, thoughts and feelings of hopelessness and worthlessness. He denies any suicidal ideations.  Patient last was seen via virtual visit about 2 months  ago.  Patient states continuing to do well overall since last session.  He reports decreased anxiety and decreased worry.  He states feeling more calm and relaxed.  He still reports having worry at times but continued efforts to shift his mind to something else to avoid dwelling on the worry.   He reports continued involvement in walking, listening to music, and watching TV.  He still practices relaxation techniques.  Patient reports he has continued taking out the trash a couple of times per week.  Patient is pleased with his progress in therapy.     SI/HI .Nowithout intent/plan  Therapist Response:  reviewed symptoms, praised and reinforced patient's continued efforts regarding behavioral activation/increased awareness of thoughts/efforts to shift attention, reviewed psychoeducation on relapse versus relapse, provided psycho education on mindfulness and an understanding of the window of tolerance as a way to help regulate emotions, assisted patient identify thoughts/behaviors/feelings he experiences when outside of the window of tolerance, assisted patient identify and practice grounding techniques to use when outside of the window of tolerance, developed plan with patient to practice a grounding technique daily, reviewed stepdown plan for termination to include  2 more sessions, processed patient's feelings about termination   Diagnosis: Axis I: MDD,       Collaboration of Care: Psychiatrist AEB patient working with psychiatrist Dr. Vickey  Patient/Guardian was advised Release of Information  must be obtained prior to any record release in order to collaborate their care with an outside provider. Patient/Guardian was advised if they have not already done so to contact the registration department to sign all necessary forms in order for us  to release information regarding their care.   Consent: Patient/Guardian gives verbal consent for treatment and assignment of benefits for services provided during this visit. Patient/Guardian expressed understanding and agreed to proceed.   Winton FORBES Rubinstein, LCSW 12/24/2023

## 2024-01-01 ENCOUNTER — Other Ambulatory Visit (HOSPITAL_COMMUNITY): Payer: Self-pay

## 2024-01-01 ENCOUNTER — Encounter: Payer: Self-pay | Admitting: Family Medicine

## 2024-01-01 MED ORDER — PREDNISONE 10 MG PO TABS
10.0000 mg | ORAL_TABLET | Freq: Two times a day (BID) | ORAL | 0 refills | Status: DC
  Filled 2024-01-01: qty 10, 5d supply, fill #0

## 2024-01-21 ENCOUNTER — Ambulatory Visit: Admitting: Urology

## 2024-01-21 ENCOUNTER — Other Ambulatory Visit: Payer: Self-pay | Admitting: Family Medicine

## 2024-01-21 ENCOUNTER — Other Ambulatory Visit: Payer: Self-pay

## 2024-01-21 ENCOUNTER — Encounter: Payer: Self-pay | Admitting: Urology

## 2024-01-21 ENCOUNTER — Other Ambulatory Visit (HOSPITAL_COMMUNITY): Payer: Self-pay

## 2024-01-21 VITALS — BP 119/77 | HR 80

## 2024-01-21 DIAGNOSIS — Z09 Encounter for follow-up examination after completed treatment for conditions other than malignant neoplasm: Secondary | ICD-10-CM | POA: Diagnosis not present

## 2024-01-21 DIAGNOSIS — N2889 Other specified disorders of kidney and ureter: Secondary | ICD-10-CM | POA: Diagnosis not present

## 2024-01-21 DIAGNOSIS — R42 Dizziness and giddiness: Secondary | ICD-10-CM

## 2024-01-21 DIAGNOSIS — N390 Urinary tract infection, site not specified: Secondary | ICD-10-CM | POA: Diagnosis not present

## 2024-01-21 LAB — URINALYSIS, ROUTINE W REFLEX MICROSCOPIC
Bilirubin, UA: NEGATIVE
Glucose, UA: NEGATIVE
Ketones, UA: NEGATIVE
Nitrite, UA: NEGATIVE
Protein,UA: NEGATIVE
Specific Gravity, UA: 1.03 (ref 1.005–1.030)
Urobilinogen, Ur: 0.2 mg/dL (ref 0.2–1.0)
pH, UA: 6 (ref 5.0–7.5)

## 2024-01-21 LAB — MICROSCOPIC EXAMINATION: Bacteria, UA: NONE SEEN

## 2024-01-21 MED ORDER — MECLIZINE HCL 25 MG PO TABS
25.0000 mg | ORAL_TABLET | Freq: Three times a day (TID) | ORAL | 1 refills | Status: AC | PRN
  Filled 2024-01-21: qty 30, 10d supply, fill #0
  Filled 2024-06-03: qty 30, 10d supply, fill #1

## 2024-01-21 NOTE — Patient Instructions (Signed)
 Urinary Tract Infection, Male A urinary tract infection (UTI) is an infection in your urinary tract. The urinary tract is made up of the organs that make, store, and get rid of pee (urine) in your body. These organs include: The kidneys. The ureters. The bladder. The urethra. What are the causes? Most UTIs are caused by germs called bacteria. They may be in or near your genitals. These germs grow and cause swelling in your urinary tract. What increases the risk? You're more likely to get a UTI if: You have a soft tube called a catheter that drains your pee. You can't control when you pee or poop. You have trouble peeing because of: A prostate that's bigger than normal. A kidney stone. A urinary blockage. A nerve condition that affects your bladder. Not getting enough to drink. You're sexually active. You're an older adult. You're also more likely to get a UTI if you have other health problems, such as: Diabetes. A weak immune system. Your immune system is your body's defense system. Sickle cell disease. Injury of the spine. What are the signs or symptoms? Symptoms may include: Needing to pee right away. Peeing small amounts often. Trouble getting the stream started. Pain or burning when you pee. Blood in your pee. Pee that smells bad or odd. Pain in your belly or lower back. You may also: Feel confused. This may be the first symptom in older adults. Vomit. Not feel hungry. Feel tired or easily annoyed. Have a fever or chills. How is this diagnosed? A UTI is diagnosed based on your medical history and an exam. You may also have other tests. These may include: Pee tests. Blood tests. Tests for sexually transmitted infections (STIs). If you've had more than one UTI, you may need to have imaging studies done to find out why you keep getting them. How is this treated? A UTI can be treated by: Taking antibiotics or other medicines. Drinking enough fluid to keep your pee  pale yellow. In rare cases, a UTI can cause a very bad condition called sepsis. Sepsis may be treated in the hospital. Follow these instructions at home: Medicines Take your medicines only as told by your health care provider. If you were given antibiotics, take them as told by your provider. Do not stop taking them even if you start to feel better. General instructions Make sure you: Pee often and fully. Do not hold your pee for a long time. Pee after you have sex. Contact a health care provider if: Your symptoms don't get better after 1-2 days of taking antibiotics. Your symptoms go away and then come back. You have a fever or chills. You vomit or feel like you may vomit. Get help right away if: You have very bad pain in your back or lower belly. You faint. This information is not intended to replace advice given to you by your health care provider. Make sure you discuss any questions you have with your health care provider. Document Revised: 04/09/2023 Document Reviewed: 08/02/2022 Elsevier Patient Education  2025 [employer].

## 2024-01-21 NOTE — Progress Notes (Signed)
 01/21/2024 11:53 AM   Gabriel [name] 10/28/1975 [id number]  Referring provider: Antonetta Rollene BRAVO, MD [address], [address] [place],  [place] [address]  Frequent UTI   HPI: Gabriel [name] is a 48yo here for followup for frequent UTI. No UTIs since last visit. No significant LUTS. IPSS 3 QOL 1 on no therapy. Nocturia 1-2x depending on fluid consumption. Uirne stream strong. No straining to urinate. No hematuria or dysuria.    PMH: Past Medical History:  Diagnosis Date   Allergy     Anxiety    Carpal tunnel syndrome 2018   Depression    Depression    Phreesia 05/15/2020   Eczema    Elevated LFTs    Heart murmur    Insomnia    Seasonal allergies    Vitamin D  deficiency     Surgical History: Past Surgical History:  Procedure Laterality Date   COLONOSCOPY     2024   NO PAST SURGERIES      Home Medications:  Allergies as of 01/21/2024   No Known Allergies      Medication List        Accurate as of January 21, 2024 11:53 AM. If you have any questions, ask your nurse or doctor.          clotrimazole -betamethasone  cream Commonly known as: LOTRISONE  Apply 1 Application topically 2 (two) times daily.   DULoxetine  60 MG capsule Commonly known as: CYMBALTA  Take 1 capsule (60 mg total) by mouth daily.   fluticasone  50 MCG/ACT nasal spray Commonly known as: FLONASE  Place 1 spray into both nostrils daily.   hydrocortisone  2.5 % cream Apply to face 2 times daily for 3 weeks, then daily for 3 weeks, then every other daily for 3 weeks, then stop. Repeat cycle as needed.   ketoconazole  2 % shampoo Commonly known as: NIZORAL  Apply to face twice weekly and back daily for 1 week, then twice weekly thereafter.   loratadine  10 MG tablet Commonly known as: CLARITIN  Take 10 mg by mouth daily as needed.   meclizine  25 MG tablet Commonly known as: ANTIVERT  Take 1 tablet (25 mg total) by mouth 3 (three) times daily as needed for dizziness.   mirtazapine  30 MG  tablet Commonly known as: REMERON  Take 1 tablet (30 mg total) by mouth at bedtime.   NONFORMULARY OR COMPOUNDED ITEM Terbinafine 3% Fluconazole 2% Tea Tree Oil 5% Urea 10% Ibuprofen  2% in DMSO Suspension #30ml Apply to affected nails once at bedtime or twice daily   predniSONE  10 MG tablet Commonly known as: DELTASONE  Take 1 tablet (10 mg total) by mouth 2 (two) times daily with a meal.   pregabalin  75 MG capsule Commonly known as: Lyrica  Take 1 capsule (75 mg total) by mouth 2 (two) times daily.   Rexulti  3 MG Tabs Generic drug: Brexpiprazole  Take 1 tablet (3 mg total) by mouth daily.   topiramate  100 MG tablet Commonly known as: Topamax  Take 1 tablet (100 mg total) by mouth daily.   tretinoin 0.025 % cream Commonly known as: RETIN-A Apply 1 application topically at bedtime.        Allergies: No Known Allergies  Family History: Family History  Problem Relation Age of Onset   Allergic rhinitis Mother    Hypertension Mother    Colon polyps Father    Diabetes Father    Hypertension Father    Hypertension Maternal Grandfather    Diabetes Maternal Grandfather    Cancer Paternal Grandmother  type unknown   Hypertension Paternal Grandmother    Cancer Paternal Grandfather        type unknown   Hypertension Paternal Grandfather    Colon cancer Neg Hx    Esophageal cancer Neg Hx    Rectal cancer Neg Hx    Stomach cancer Neg Hx     Social History:  reports that he has never smoked. He has never been exposed to tobacco smoke. He has never used smokeless tobacco. He reports that he does not drink alcohol and does not use drugs.  ROS: All other review of systems were reviewed and are negative except what is noted above in HPI  Physical Exam: BP 119/77   Pulse 80   Constitutional:  Alert and oriented, No acute distress. HEENT: [place] AT, moist mucus membranes.  Trachea midline, no masses. Cardiovascular: No clubbing, cyanosis, or edema. Respiratory: Normal  respiratory effort, no increased work of breathing. GI: Abdomen is soft, nontender, nondistended, no abdominal masses GU: No CVA tenderness.  Lymph: No cervical or inguinal lymphadenopathy. Skin: No rashes, bruises or suspicious lesions. Neurologic: Grossly intact, no focal deficits, moving all 4 extremities. Psychiatric: Normal mood and affect.  Laboratory Data: Lab Results  Component Value Date   WBC 3.6 04/08/2023   HGB 15.0 04/08/2023   HCT 45.6 04/08/2023   MCV 89 04/08/2023   PLT 376 04/08/2023    Lab Results  Component Value Date   CREATININE 1.04 11/07/2023    Lab Results  Component Value Date   PSA 0.5 02/18/2018   PSA 0.3 12/04/2016    No results found for: TESTOSTERONE  Lab Results  Component Value Date   HGBA1C 6.4 (H) 11/07/2023    Urinalysis    Component Value Date/Time   APPEARANCEUR Clear 04/23/2023 1013   GLUCOSEU Negative 04/23/2023 1013   BILIRUBINUR Negative 04/23/2023 1013   KETONESUR negative 02/20/2023 1541   PROTEINUR Negative 04/23/2023 1013   UROBILINOGEN 0.2 02/20/2023 1541   NITRITE Negative 04/23/2023 1013   LEUKOCYTESUR Negative 04/23/2023 1013    Lab Results  Component Value Date   LABMICR See below: 04/23/2023   WBCUA None seen 04/23/2023   LABEPIT 0-10 04/23/2023   BACTERIA None seen 04/23/2023    Pertinent Imaging:  No results found for this or any previous visit.  No results found for this or any previous visit.  No results found for this or any previous visit.  No results found for this or any previous visit.  Results for orders placed during the hospital encounter of 04/14/23  US  Renal  Narrative CLINICAL DATA:  Recurrent urinary tract infection  EXAM: RENAL / URINARY TRACT ULTRASOUND COMPLETE  COMPARISON:  Ultrasound abdomen 06/02/2019  FINDINGS: Right Kidney:  Renal measurements: 11.9 x 5.1 x 5.3 cm = volume: 170 mL. Normal renal cortical thickness and echogenicity. There is a 1.8 x 1.6  cm hypoechoic mass within the right kidney. There is a 0.8 x 0.7 cm exophytic hypoechoic mass midpole right kidney, too small to characterize.  Left Kidney:  Renal measurements: 12.4 x 6.4 x 5.6 cm = volume: 234.0 mL. Normal renal cortical thickness and echogenicity. No mass. Mild pelviectasis.  Bladder:  Appears normal for degree of bladder distention.  Other:  None.  IMPRESSION: 1. There is a 1.8 cm hypoechoic mass within the right kidney. This may represent a complex cyst. Recommend further evaluation with pre and post contrast-enhanced abdominal MRI. 2. Mild left pelviectasis.   Electronically Signed By: Bard Moats M.D. On:  04/14/2023 09:50  No results found for this or any previous visit.  No results found for this or any previous visit.  No results found for this or any previous visit.   Assessment & Plan:    1. Recurrent UTI Resolved    No follow-ups on file.  Belvie Clara, MD  [facility] [place]

## 2024-01-25 NOTE — Progress Notes (Signed)
 Virtual Visit via Video Note  I connected with Gabriel [name] on 01/30/24 at 10:00 AM EDT by a video enabled telemedicine application and verified that I am speaking with the correct person using two identifiers.  Location: Patient: home Provider: home office Persons participated in the visit- patient, provider    I discussed the limitations of evaluation and management by telemedicine and the availability of in person appointments. The patient expressed understanding and agreed to proceed.    I discussed the assessment and treatment plan with the patient. The patient was provided an opportunity to ask questions and all were answered. The patient agreed with the plan and demonstrated an understanding of the instructions.   The patient was advised to call back or seek an in-person evaluation if the symptoms worsen or if the condition fails to improve as anticipated.  Gabriel Sleet, MD    [facility] MD/PA/NP OP Progress Note  01/30/2024 10:35 AM Gabriel [name]  MRN:  [id number]  Chief Complaint:  Chief Complaint  Patient presents with   Follow-up   HPI:  This is a follow-up appointment for depression, anxiety and insomnia.  He states that he has not noticed any difference since discontinuation of prazosin .  He continues to experience dizziness.  He cannot remember how he was feeling when he was on that medication.  He states that his mood is not bad.  He has been working on breathing exercises.  He has been trying to relax.  He has different thoughts in his mind.  He thinks about the time he lost his job, and he has spiraled down since then.  He has pain secondary to carpal tunnel, neuropathic pain.  This remind him of the time he lost his job despite him working so hard.  He has middle insomnia.  He denies feeling depressed.  He has been taking a walk few times per week and it is refreshing.  He denies SI, HI, hallucinations.  He denies change in appetite.  He agrees with the plans as  outlined below.   Wt Readings from Last 3 Encounters:  11/07/23 207 lb 1.3 oz (93.9 kg)  08/06/23 206 lb (93.4 kg)  05/02/23 199 lb 3.2 oz (90.4 kg)     Visit Diagnosis:    ICD-10-CM   1. MDD (major depressive disorder), recurrent, in partial remission (HCC)  F33.41     2. Anxiety  F41.9     3. Insomnia, unspecified type  G47.00       Past Psychiatric History: Please see initial evaluation for full details. I have reviewed the history. No updates at this time.     Past Medical History:  Past Medical History:  Diagnosis Date   Allergy     Anxiety    Carpal tunnel syndrome 2018   Depression    Depression    Phreesia 05/15/2020   Eczema    Elevated LFTs    Heart murmur    Insomnia    Seasonal allergies    Vitamin D  deficiency     Past Surgical History:  Procedure Laterality Date   COLONOSCOPY     2024   NO PAST SURGERIES      Family Psychiatric History: Please see initial evaluation for full details. I have reviewed the history. No updates at this time.     Family History:  Family History  Problem Relation Age of Onset   Allergic rhinitis Mother    Hypertension Mother    Colon polyps Father    Diabetes Father  Hypertension Father    Hypertension Maternal Grandfather    Diabetes Maternal Grandfather    Cancer Paternal Grandmother        type unknown   Hypertension Paternal Grandmother    Cancer Paternal Grandfather        type unknown   Hypertension Paternal Grandfather    Colon cancer Neg Hx    Esophageal cancer Neg Hx    Rectal cancer Neg Hx    Stomach cancer Neg Hx     Social History:  Social History   Socioeconomic History   Marital status: Married    Spouse name: Not on file   Number of children: 2   Years of education: 12   Highest education level: High school graduate  Occupational History   Occupation: not employed  Tobacco Use   Smoking status: Never    Passive exposure: Never   Smokeless tobacco: Never  Vaping Use   Vaping  status: Never Used  Substance and Sexual Activity   Alcohol use: No    Alcohol/week: 0.0 standard drinks of alcohol   Drug use: No   Sexual activity: Yes    Birth control/protection: None  Other Topics Concern   Not on file  Social History Narrative   He lives with wife and two children.   Unemployed at this present time   Right handed   One story home   Highest level of education:  12th grade   Social Drivers of Health   Financial Resource Strain: Medium Risk (09/08/2017)   Overall Financial Resource Strain (CARDIA)    Difficulty of Paying Living Expenses: Somewhat hard  Food Insecurity: No Food Insecurity (09/08/2017)   Hunger Vital Sign    Worried About Running Out of Food in the Last Year: Never true    Ran Out of Food in the Last Year: Never true  Transportation Needs: No Transportation Needs (09/08/2017)   PRAPARE - Administrator, Civil Service (Medical): No    Lack of Transportation (Non-Medical): No  Physical Activity: Not on file  Stress: Stress Concern Present (09/08/2017)   [employer] of Occupational Health - Occupational Stress Questionnaire    Feeling of Stress : Very much  Social Connections: Not on file    Allergies: No Known Allergies  Metabolic Disorder Labs: Lab Results  Component Value Date   HGBA1C 6.4 (H) 11/07/2023   MPG 137 05/27/2019   MPG 137 12/29/2018   No results found for: PROLACTIN Lab Results  Component Value Date   CHOL 172 11/07/2023   TRIG 55 11/07/2023   HDL 46 11/07/2023   CHOLHDL 3.7 11/07/2023   VLDL 10 12/04/2016   LDLCALC 115 (H) 11/07/2023   LDLCALC 97 09/03/2022   Lab Results  Component Value Date   TSH 1.800 08/06/2023   TSH 1.440 09/03/2022    Therapeutic Level Labs: No results found for: LITHIUM No results found for: VALPROATE No results found for: CBMZ  Current Medications: Current Outpatient Medications  Medication Sig Dispense Refill   mirtazapine  (REMERON ) 45 MG tablet Take 1  tablet (45 mg total) by mouth at bedtime. 90 tablet 0   Brexpiprazole  (REXULTI ) 3 MG TABS Take 1 tablet (3 mg total) by mouth daily. 90 tablet 1   clotrimazole -betamethasone  (LOTRISONE ) cream Apply 1 Application topically 2 (two) times daily. 45 g 1   DULoxetine  (CYMBALTA ) 60 MG capsule Take 1 capsule (60 mg total) by mouth daily. 90 capsule 1   fluticasone  (FLONASE ) 50 MCG/ACT nasal spray Place  1 spray into both nostrils daily. 16 g 5   hydrocortisone  2.5 % cream Apply to face 2 times daily for 3 weeks, then daily for 3 weeks, then every other daily for 3 weeks, then stop. Repeat cycle as needed. 30 g 3   ketoconazole  (NIZORAL ) 2 % shampoo Apply to face twice weekly and back daily for 1 week, then twice weekly thereafter. 120 mL 11   loratadine  (CLARITIN ) 10 MG tablet Take 10 mg by mouth daily as needed.     meclizine  (ANTIVERT ) 25 MG tablet Take 1 tablet (25 mg total) by mouth 3 (three) times daily as needed for dizziness. 30 tablet 1   NONFORMULARY OR COMPOUNDED ITEM Terbinafine 3% Fluconazole 2% Tea Tree Oil 5% Urea 10% Ibuprofen  2% in DMSO Suspension #30ml Apply to affected nails once at bedtime or twice daily     predniSONE  (DELTASONE ) 10 MG tablet Take 1 tablet (10 mg total) by mouth 2 (two) times daily with a meal. 10 tablet 0   [START ON 02/26/2024] pregabalin  (LYRICA ) 75 MG capsule Take 1 capsule (75 mg total) by mouth 2 (two) times daily. 60 capsule 0   topiramate  (TOPAMAX ) 100 MG tablet Take 1 tablet (100 mg total) by mouth daily. 90 tablet 3   tretinoin (RETIN-A) 0.025 % cream Apply 1 application topically at bedtime.     No current facility-administered medications for this visit.     Musculoskeletal: Strength & Muscle Tone: N/A Gait & Station: N/A Patient leans: N/A  Psychiatric Specialty Exam: Review of Systems  Psychiatric/Behavioral:  Positive for sleep disturbance. Negative for agitation, behavioral problems, confusion, decreased concentration, dysphoric mood,  hallucinations, self-injury and suicidal ideas. The patient is nervous/anxious. The patient is not hyperactive.   All other systems reviewed and are negative.   There were no vitals taken for this visit.There is no height or weight on file to calculate BMI.  General Appearance: Well Groomed  Eye Contact:  Good  Speech:  Clear and Coherent  Volume:  Normal  Mood:  anxious  Affect:  Appropriate, Congruent, and calm  Thought Process:  Coherent  Orientation:  Full (Time, Place, and Person)  Thought Content: Logical   Suicidal Thoughts:  No  Homicidal Thoughts:  No  Memory:  Immediate;   Good  Judgement:  Good  Insight:  Good  Psychomotor Activity:  Normal  Concentration:  Concentration: Good and Attention Span: Good  Recall:  Good  Fund of Knowledge: Good  Language: Good  Akathisia:  No  Handed:  Right  AIMS (if indicated): not done  Assets:  Communication Skills Desire for Improvement  ADL's:  Intact  Cognition: WNL  Sleep:  Poor   Screenings: GAD-7    Flowsheet Row Office Visit from 11/07/2023 in [facility] Counselor from 09/25/2023 in [facility] at [place] Counselor from 08/28/2023 in [facility] at [place] Office Visit from 08/06/2023 in [facility] Office Visit from 04/08/2023 in [facility]  Total GAD-7 Score 16 9 16 17  0   PHQ2-9    Flowsheet Row Office Visit from 11/07/2023 in [facility] Office Visit from 08/06/2023 in [facility] Office Visit from 04/08/2023 in [facility] Counselor from 03/24/2023 in [facility] Outpatient Behavior[facility] at [place] Office Visit from 09/04/2022 in [facility] Primary Care  PHQ-2 Total Score 2 2 0 2 2  PHQ-9 Total Score  14 12 -- 9 9   Flowsheet Row Counselor from 03/24/2023 in [place] [facility]  at [place] [facility] from 02/20/2023 in [facility] [place] [facility] from 05/24/2022 in [facility] at [place]  C-SSRS RISK CATEGORY No Risk No Risk No Risk     Assessment and Plan:  Gabriel [name] is a 48 y.o. year old male with a history of depression, bilateral carpel tunnel syndrome, who presents for follow up appointment for below.   1. MDD (major depressive disorder), recurrent, in partial remission (HCC) 2. Anxiety # r/o PTSD The patient experiences chronic pain, which significantly impacts his daily functioning. Socially, he has been unemployed since being laid off, an event he perceives as related to racial discrimination. In childhood, he was raised by his mother, who instilled in him values of love and respect. History:  not interested in TMS  He continues to experience anxiety and a flashback, which has been triggered by his neuropathic pain.  Will uptitrate mirtazapine  to optimize treatment for depression and anxiety.  Discussed potential risk of increase in appetite.  Noted that he prefers to avoid medication which can potentially cause nausea.  Will continue current dose of duloxetine  to target depression and neuropathic pain.  Will continue pregabalin  for anxiety and neuropathic pain.  While he initially perceived benefit from prazosin , he now denies any difference since discontinuation of this medication to mitigate risk of drowsiness.   3. Insomnia, unspecified type  - sleep study was not consistent with sleep apnea in 2023      He continues to experience middle insomnia.  Prazosin  was discontinued due to concern of drowsiness.  May consider Orexin antagonist in the future.   Plan   Continue duloxetine  60 mg daily (limited benefit from higher dose)  Increase mirtazapine  45 mg at night  Continue Rexulti  3 mg at night - monitor drowsiness  Continue pregabalin  75 mg twice a day. Next appointment: 11/7 at 10 am for 30 mins , video [email or web address].   - on tramadol    - on topiramate  for migraine   Past trials of medication: sertraline , fluoxetine , venlafaxine , mirtazapine , nortriptyline  for carpel tunnel syndrome, bupropion  (drowsiness), quetiapine  (drowsiness), Abilify  (off balance), Trazodone , Ambien  (limited benefit),  temazepam , Lunesta , lorazepam  (worsening in anxiety, drowsiness) , prazosin  (drowsiness)    The patient demonstrates the following risk factors for suicide: Chronic risk factors for suicide include: psychiatric disorder of depression. Acute risk factors for suicide include: unemployment and loss (financial, interpersonal, professional). Protective factors for this patient include: positive social support, responsibility to others (children, family) and hope for the future. Considering these factors, the overall suicide risk at this point appears to be low. Patient is appropriate for outpatient follow up.   Collaboration of Care: Collaboration of Care: Other reviewed notes in Epic  Patient/Guardian was advised Release of Information must be obtained prior to any record release in order to collaborate their care with an outside provider. Patient/Guardian was advised if they have not already done so to contact the registration department to sign all necessary forms in order for us  to release information regarding their care.   Consent: Patient/Guardian gives verbal consent for treatment and assignment of benefits for services provided during this visit. Patient/Guardian expressed understanding and agreed to proceed.    Gabriel Sleet, MD 01/30/2024, 10:35 AM

## 2024-01-26 ENCOUNTER — Ambulatory Visit (INDEPENDENT_AMBULATORY_CARE_PROVIDER_SITE_OTHER): Admitting: Psychiatry

## 2024-01-26 DIAGNOSIS — F3341 Major depressive disorder, recurrent, in partial remission: Secondary | ICD-10-CM

## 2024-01-26 DIAGNOSIS — F329 Major depressive disorder, single episode, unspecified: Secondary | ICD-10-CM

## 2024-01-26 NOTE — Progress Notes (Signed)
 Virtual Visit via Video Note  I connected with Gabriel [name] on 01/26/24 at 11;12 AM EDT  by a video enabled telemedicine application and verified that I am speaking with the correct person using two identifiers.  Location: Patient: Home Provider:  Home office    I discussed the limitations of evaluation and management by telemedicine and the availability of in person appointments. The patient expressed understanding and agreed to proceed.  I provided 36 minutes of non-face-to-face time during this encounter.   Winton FORBES Rubinstein, LCSW         THERAPIST PROGRESS NOTE    Session Time:  Monday 01/26/2024 11:12 AM - 11:48 AM  Participation Level: Active  Treatment Goals addressed:   Improve ability to manage stress and anxiety AEB reducing episodes of anxiety (worry & nervousness) from 4 x per week to 1 x per week for 60 days per pt's self-report . Increase social interaction beyond immediate family to 3-4 x per week per pt's report.  Report a decrease in anxiety symptoms as evidenced by an overall reduction in anxiety score by a minimum of 25% on the Generalized Anxiety Disorder Scale for 2 months.     Progress in treatment:  progressing    Interventions: CBT and Supportive  Summary: Gabriel [name] is a 48 y.o. male who is referred for services by PCP Dr. Rollene Pesa due to patient experiencing symptoms of depression. He denies any psychiatric hospitalizations, He saw therapist T[facility]ter in [place] for a few months. Patient reports symptoms began when he lost his job about a year ago. Per his report, he was fired after he complained about race discrimination. 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 He states now feeling as though he failed his family. He states not wanting to be around anyone and having difficulty engaging with his wife and  children. He fears losing his family.  He reports ruminating thoughts about job and the past, excessive worry depressed mood, tearfulness, anxiety, isolative behaviors, poor motivation, poor concentration, irritability, sleep difficulty, thoughts and feelings of hopelessness and worthlessness. He denies any suicidal ideations.  Patient last was seen via virtual visit about 1 month  ago.  Patient states continuing to do well overall since last session.  He reports little to no change in symptoms.  He remains pleased with his progress in therapy and reports things have been stable.  He has been practicing grounding techniques and reports this has been helpful.      SI/HI .Nowithout intent/plan  Therapist Response:  reviewed symptoms, praised and reinforced patient's efforts to practice grounding techniques, discussed effects, reviewed psychoeducation on mindfulness and understanding of the window of tolerance, discussed rationale for and assisted patient practice mindfulness activities to improve mindfulness skills, developed plan with patient to practice a mindfulness activity daily, will send patient mental health maintenance plan in preparation for next session  Diagnosis: Axis I: MDD,       Collaboration of Care: Psychiatrist AEB patient working with psychiatrist Dr. Vickey  Patient/Guardian was advised Release of Information must be obtained prior to any record release in order to collaborate their care with an outside provider. Patient/Guardian was advised if they have not already done so to contact the registration department to sign all necessary forms in order for us  to release information regarding their care.  Consent: Patient/Guardian gives verbal consent for treatment and assignment of benefits for services provided during this visit. Patient/Guardian expressed understanding and agreed to proceed.   Winton FORBES Rubinstein, LCSW 01/26/2024

## 2024-01-30 ENCOUNTER — Other Ambulatory Visit: Payer: Self-pay

## 2024-01-30 ENCOUNTER — Encounter: Payer: Self-pay | Admitting: Psychiatry

## 2024-01-30 ENCOUNTER — Telehealth (INDEPENDENT_AMBULATORY_CARE_PROVIDER_SITE_OTHER): Admitting: Psychiatry

## 2024-01-30 ENCOUNTER — Other Ambulatory Visit (HOSPITAL_BASED_OUTPATIENT_CLINIC_OR_DEPARTMENT_OTHER): Payer: Self-pay

## 2024-01-30 DIAGNOSIS — F419 Anxiety disorder, unspecified: Secondary | ICD-10-CM

## 2024-01-30 DIAGNOSIS — F3341 Major depressive disorder, recurrent, in partial remission: Secondary | ICD-10-CM

## 2024-01-30 DIAGNOSIS — G4700 Insomnia, unspecified: Secondary | ICD-10-CM | POA: Diagnosis not present

## 2024-01-30 MED ORDER — MIRTAZAPINE 45 MG PO TABS
45.0000 mg | ORAL_TABLET | Freq: Every day | ORAL | 0 refills | Status: DC
  Filled 2024-01-30: qty 90, 90d supply, fill #0

## 2024-01-30 MED ORDER — PREGABALIN 75 MG PO CAPS
75.0000 mg | ORAL_CAPSULE | Freq: Two times a day (BID) | ORAL | 0 refills | Status: DC
  Filled 2024-01-30: qty 60, 30d supply, fill #0

## 2024-01-30 MED ORDER — DULOXETINE HCL 60 MG PO CPEP
60.0000 mg | ORAL_CAPSULE | Freq: Every day | ORAL | 1 refills | Status: AC
  Filled 2024-01-30: qty 90, 90d supply, fill #0
  Filled 2024-06-03: qty 90, 90d supply, fill #1

## 2024-01-30 NOTE — Patient Instructions (Signed)
 Continue duloxetine  60 mg daily  Increase mirtazapine  45 mg at night  Continue Rexulti  3 mg at night Continue pregabalin  75 mg twice a day Next appointment: 11/7 at 10 am

## 2024-02-09 ENCOUNTER — Ambulatory Visit (INDEPENDENT_AMBULATORY_CARE_PROVIDER_SITE_OTHER): Admitting: Psychiatry

## 2024-02-09 DIAGNOSIS — F3341 Major depressive disorder, recurrent, in partial remission: Secondary | ICD-10-CM

## 2024-02-09 NOTE — Progress Notes (Signed)
 Virtual Visit via Video Note  I connected with Gabriel [name] on 02/09/24 at 11:08 AM EDT  by a video enabled telemedicine application and verified that I am speaking with the correct person using two identifiers.  Location: Patient: Home Provider:  Home office    I discussed the limitations of evaluation and management by telemedicine and the availability of in person appointments. The patient expressed understanding and agreed to proceed.  I provided 52  minutes of non-face-to-face time during this encounter.   Winton FORBES Rubinstein, LCSW         THERAPIST PROGRESS NOTE    Session Time:  Monday 01/26/2024 11:08 AM - 12:00 PM  Participation Level: Active  Treatment Goals addressed:   Improve ability to manage stress and anxiety AEB reducing episodes of anxiety (worry & nervousness) from 4 x per week to 1 x per week for 60 days per pt's self-report . Increase social interaction beyond immediate family to 3-4 x per week per pt's report.  Report a decrease in anxiety symptoms as evidenced by an overall reduction in anxiety score by a minimum of 25% on the Generalized Anxiety Disorder Scale for 2 months.     Progress in treatment:  progressing    Interventions: CBT and Supportive  Summary: Gabriel [name] is a 48 y.o. male who is referred for services by PCP Dr. Rollene Pesa due to patient experiencing symptoms of depression. He denies any psychiatric hospitalizations, He saw therapist [facility] in [place] for a few months. Patient reports symptoms began when he lost his job about a year ago. Per his report, he was fired after he complained about race discrimination. 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 He states now feeling as though he failed his family. He states not wanting to be around anyone and having difficulty engaging with his wife and  children. He fears losing his family.  He reports ruminating thoughts about job and the past, excessive worry depressed mood, tearfulness, anxiety, isolative behaviors, poor motivation, poor concentration, irritability, sleep difficulty, thoughts and feelings of hopelessness and worthlessness. He denies any suicidal ideations.  Patient last was seen via virtual visit about 1 month  ago.  Patient states continuing to do well overall since last session.  He reports little to no change in symptoms. He has been practicing mindfulness activities and reports being able to be in the moment. He also reports practicing mindfulness activities has been relaxing. He remains pleased with his progress in therapy and reports things have been stable.  He continues to practice grounding techniques and reports this has been helpful.  Per his report, he has maintained involvement in activities (walking, listening to music) and engagement with his family.   Pt expresses confidence in his ability to successfully use helpful coping strategies.      SI/HI .Nowithout intent/plan  Therapist Response:  reviewed symptoms, praised and reinforced patient's efforts to practice mindfulness techniques, discussed effects, reviewed patient's progress in therapy, assisted patient develop mental health maintenance plan, did termination, encouraged patient to contact this practice should he need psychotherapy services in the future  Diagnosis: Axis I: MDD, recurrent, in partial remission      Collaboration of Care: Psychiatrist AEB patient working with psychiatrist Dr. Vickey  Patient/Guardian was advised Release of Information must be obtained prior to any record release  in order to collaborate their care with an outside provider. Patient/Guardian was advised if they have not already done so to contact the registration department to sign all necessary forms in order for us  to release information regarding their care.   Consent:  Patient/Guardian gives verbal consent for treatment and assignment of benefits for services provided during this visit. Patient/Guardian expressed understanding and agreed to proceed.   Winton FORBES Rubinstein, LCSW 02/09/2024  Outpatient Therapist Discharge Summary  Krishang Reading    1976/01/29   Admission Date: 12/03/2018   Discharge Date:  02/09/2024 Diagnosis:  Axis I:  MDD (major depressive disorder), recurrent, in partial remission   Comments: Pt is pleased with his progress in therapy and expresses confidence in his ability to successfully use helpful coping strategies.Pt is encouraged to contact this practice should he decide to pursue psychotherapy in the future. He will continue to see Dr. Hisada for medication management.  Rhylee Pucillo E Danniel Grenz LCSW

## 2024-03-15 NOTE — Progress Notes (Unsigned)
 Virtual Visit via Video Note  I connected with Gabriel [name] on 03/19/24 at 10:00 AM EST by a video enabled telemedicine application and verified that I am speaking with the correct person using two identifiers.  Location: Patient: home Provider: home office Persons participated in the visit- patient, provider    I discussed the limitations of evaluation and management by telemedicine and the availability of in person appointments. The patient expressed understanding and agreed to proceed.   I discussed the assessment and treatment plan with the patient. The patient was provided an opportunity to ask questions and all were answered. The patient agreed with the plan and demonstrated an understanding of the instructions.   The patient was advised to call back or seek an in-person evaluation if the symptoms worsen or if the condition fails to improve as anticipated.  Gabriel Sleet, MD    [facility] MD/PA/NP OP Progress Note  03/19/2024 12:53 PM [name]  MRN:  [id number]  Chief Complaint:  Chief Complaint  Patient presents with   Follow-up   HPI:  This is a follow-up appointment for depression and anxiety.  He states that he has good days and bad days.  He is watching TV, which causes more anxiety.  He is concerned that something might be happening to him as the people in TV, referring to him having lost a job and is on disability.  He states that it occurred in the same administration.  This has been weighing on him, although he tries not to dwell on this.  He listens to music and tries to soothe himself.  He listens to the nature.  While exploring the way he can contribute or expand his connection, he is willing to consider going to Thanksgiving at his mother-in-law.  He continues to struggle with sleep.  Although he has occasional pain and nocturia, he is sometimes just wake up.  He has occasional racing thoughts, thinking about over and over.  However, although he used to be an  awful place, he is now able to get out from there.  He has been eating a little more, although he does not have a concern about this at this time.  He expressed understanding about the side effect from mirtazapine , and has agreed to monitor this.  Although he feels drowsy during the day, he is unsure if it is because of the insomnia.  He denies any worsening since uptitration of mirtazapine .  He denies SI, HI, hallucinations.  He denies alcohol use or drug use.  He agrees with the plans as outlined below.   Wt Readings from Last 3 Encounters:  03/18/24 212 lb (96.2 kg)  11/07/23 207 lb 1.3 oz (93.9 kg)  08/06/23 206 lb (93.4 kg)     Visit Diagnosis:    ICD-10-CM   1. MDD (major depressive disorder), recurrent, in partial remission  F33.41     2. Anxiety  F41.9     3. Insomnia, unspecified type  G47.00       Past Psychiatric History: Please see initial evaluation for full details. I have reviewed the history. No updates at this time.     Past Medical History:  Past Medical History:  Diagnosis Date   Allergy     Anxiety    Carpal tunnel syndrome 2018   Depression    Depression    Phreesia 05/15/2020   Eczema    Elevated LFTs    Heart murmur    Insomnia    Seasonal allergies  Vitamin D  deficiency     Past Surgical History:  Procedure Laterality Date   COLONOSCOPY     2024   NO PAST SURGERIES      Family Psychiatric History: Please see initial evaluation for full details. I have reviewed the history. No updates at this time.     Family History:  Family History  Problem Relation Age of Onset   Allergic rhinitis Mother    Hypertension Mother    Colon polyps Father    Diabetes Father    Hypertension Father    Hypertension Maternal Grandfather    Diabetes Maternal Grandfather    Cancer Paternal Grandmother        type unknown   Hypertension Paternal Grandmother    Cancer Paternal Grandfather        type unknown   Hypertension Paternal Grandfather    Colon  cancer Neg Hx    Esophageal cancer Neg Hx    Rectal cancer Neg Hx    Stomach cancer Neg Hx     Social History:  Social History   Socioeconomic History   Marital status: Married    Spouse name: Not on file   Number of children: 2   Years of education: 12   Highest education level: High school graduate  Occupational History   Occupation: not employed  Tobacco Use   Smoking status: Never    Passive exposure: Never   Smokeless tobacco: Never  Vaping Use   Vaping status: Never Used  Substance and Sexual Activity   Alcohol use: No    Alcohol/week: 0.0 standard drinks of alcohol   Drug use: No   Sexual activity: Yes    Birth control/protection: None  Other Topics Concern   Not on file  Social History Narrative   He lives with wife and two children.   Unemployed at this present time   Right handed   One story home   Highest level of education:  12th grade   Social Drivers of Health   Financial Resource Strain: Medium Risk (09/08/2017)   Overall Financial Resource Strain (CARDIA)    Difficulty of Paying Living Expenses: Somewhat hard  Food Insecurity: No Food Insecurity (09/08/2017)   Hunger Vital Sign    Worried About Running Out of Food in the Last Year: Never true    Ran Out of Food in the Last Year: Never true  Transportation Needs: No Transportation Needs (09/08/2017)   PRAPARE - Administrator, Civil Service (Medical): No    Lack of Transportation (Non-Medical): No  Physical Activity: Not on file  Stress: Stress Concern Present (09/08/2017)   [employer] of Occupational Health - Occupational Stress Questionnaire    Feeling of Stress : Very much  Social Connections: Not on file    Allergies: No Known Allergies  Metabolic Disorder Labs: Lab Results  Component Value Date   HGBA1C 6.4 (H) 11/07/2023   MPG 137 05/27/2019   MPG 137 12/29/2018   No results found for: PROLACTIN Lab Results  Component Value Date   CHOL 172 11/07/2023   TRIG 55  11/07/2023   HDL 46 11/07/2023   CHOLHDL 3.7 11/07/2023   VLDL 10 12/04/2016   LDLCALC 115 (H) 11/07/2023   LDLCALC 97 09/03/2022   Lab Results  Component Value Date   TSH 1.800 08/06/2023   TSH 1.440 09/03/2022    Therapeutic Level Labs: No results found for: LITHIUM No results found for: VALPROATE No results found for: CBMZ  Current Medications:  Current Outpatient Medications  Medication Sig Dispense Refill   [START ON 04/26/2024] Brexpiprazole  (REXULTI ) 3 MG TABS Take 1 tablet (3 mg total) by mouth daily. 90 tablet 1   clotrimazole -betamethasone  (LOTRISONE ) cream Apply 1 Application topically 2 (two) times daily. 45 g 1   DULoxetine  (CYMBALTA ) 60 MG capsule Take 1 capsule (60 mg total) by mouth daily. 90 capsule 1   fluticasone  (FLONASE ) 50 MCG/ACT nasal spray Place 1 spray into both nostrils daily. 48 g 1   ketoconazole  (NIZORAL ) 2 % shampoo Apply to face twice weekly and back daily for 1 week, then twice weekly thereafter. 120 mL 11   loratadine  (CLARITIN ) 10 MG tablet Take 10 mg by mouth daily as needed.     meclizine  (ANTIVERT ) 25 MG tablet Take 1 tablet (25 mg total) by mouth 3 (three) times daily as needed for dizziness. 30 tablet 1   [START ON 05/02/2024] mirtazapine  (REMERON ) 45 MG tablet Take 1 tablet (45 mg total) by mouth at bedtime. 90 tablet 0   NONFORMULARY OR COMPOUNDED ITEM Terbinafine 3% Fluconazole 2% Tea Tree Oil 5% Urea 10% Ibuprofen  2% in DMSO Suspension #30ml Apply to affected nails once at bedtime or twice daily     pregabalin  (LYRICA ) 75 MG capsule Take 1 capsule (75 mg total) by mouth 2 (two) times daily. 60 capsule 0   topiramate  (TOPAMAX ) 100 MG tablet Take 1 tablet (100 mg total) by mouth daily. 90 tablet 3   tretinoin (RETIN-A) 0.025 % cream Apply 1 application topically at bedtime.     No current facility-administered medications for this visit.     Musculoskeletal: Strength & Muscle Tone: N/A Gait & Station: N/A Patient leans:  N/A  Psychiatric Specialty Exam: Review of Systems  Psychiatric/Behavioral:  Positive for sleep disturbance. Negative for agitation, behavioral problems, confusion, decreased concentration, dysphoric mood, hallucinations, self-injury and suicidal ideas. The patient is nervous/anxious. The patient is not hyperactive.   All other systems reviewed and are negative.   There were no vitals taken for this visit.There is no height or weight on file to calculate BMI.  General Appearance: Well Groomed  Eye Contact:  Good  Speech:  Clear and Coherent  Volume:  Normal  Mood:  Anxious  Affect:  Appropriate, Congruent, and Restricted  Thought Process:  Coherent  Orientation:  Full (Time, Place, and Person)  Thought Content: Logical   Suicidal Thoughts:  No  Homicidal Thoughts:  No  Memory:  Immediate;   Good  Judgement:  Good  Insight:  Good  Psychomotor Activity:  Normal  Concentration:  Concentration: Good and Attention Span: Good  Recall:  Good  Fund of Knowledge: Good  Language: Good  Akathisia:  No  Handed:  Right  AIMS (if indicated): not done  Assets:  Communication Skills Desire for Improvement  ADL's:  Intact  Cognition: WNL  Sleep:  Poor   Screenings: GAD-7    Flowsheet Row Office Visit from 03/18/2024 in [facility] Office Visit from 11/07/2023 in [facility] Counselor from 09/25/2023 in [facility] at [place] Counselor from 08/28/2023 in [facility] at [place] Office Visit from 08/06/2023 in [facility]  Total GAD-7 Score 14 16 9 16 17    PHQ2-9    Flowsheet Row Office Visit from 03/18/2024 in [facility] Office Visit from 11/07/2023 in [facility] Office Visit from 08/06/2023 in [facility] Office  Visit from 04/08/2023 in [facility] Primary [facility] Counselor from  03/24/2023 in [facility] at [facility] Total Score 2 2 2  0 2  PHQ-9 Total Score 14 14 12  -- 9   Flowsheet Row Counselor from 03/24/2023 in [facility] [place] [facility] from 02/20/2023 in [facility] at [place] [facility] from 05/24/2022 in [facility] at [place]  C-SSRS RISK CATEGORY No Risk No Risk No Risk     Assessment and Plan:  Hulen Mandler is a 48 y.o. year old male with a history of depression, bilateral carpel tunnel syndrome, who presents for follow up appointment for below.   1. MDD (major depressive disorder), recurrent, in partial remission 2. Anxiety # r/o PTSD The patient experiences chronic pain, which significantly impacts his daily functioning. Socially, he has been unemployed since being laid off, an event he perceives as related to racial discrimination. In childhood, he was raised by his mother, who instilled in him values of love and respect. History:  not interested in TMS  He reports significant worsening in anxiety along with flashback, which has been triggered due to the current administration.  Although he denies any effectiveness from higher dose of mirtazapine , he would like to stay on the current medication regimen at this time.  Will continue current dose of duloxetine  to target depression and anxiety, neuropathic pain.  Will continue pregabalin  for anxiety and neuropathic pain.  May consider up titration of the nighttime dose in the future to optimize treatment for anxiety and insomnia.   3. Insomnia, unspecified type  - sleep study was not consistent with sleep apnea in 2023       He continues to experience middle insomnia.  May consider up titration of pregabalin  or Orexin antagonist in the future.    Plan   Continue duloxetine  60 mg daily (limited benefit from higher dose)  Continue mirtazapine  45 mg at night - monitor appetite Continue Rexulti  3 mg at night -  monitor drowsiness  Continue pregabalin  75 mg twice a day. Next appointment: 1/7 at 11 30 for 30 mins , video [email or web address].  - on tramadol    - on topiramate  for migraine   Past trials of medication: sertraline , fluoxetine , venlafaxine , mirtazapine , nortriptyline  for carpel tunnel syndrome, bupropion  (drowsiness), quetiapine  (drowsiness), Abilify  (off balance), Trazodone , Ambien  (limited benefit),  temazepam , Lunesta , lorazepam  (worsening in anxiety, drowsiness) , prazosin  (drowsiness)    The patient demonstrates the following risk factors for suicide: Chronic risk factors for suicide include: psychiatric disorder of depression. Acute risk factors for suicide include: unemployment and loss (financial, interpersonal, professional). Protective factors for this patient include: positive social support, responsibility to others (children, family) and hope for the future. Considering these factors, the overall suicide risk at this point appears to be low. Patient is appropriate for outpatient follow up.   Collaboration of Care: Collaboration of Care: Other reviewed notes in Epic  Patient/Guardian was advised Release of Information must be obtained prior to any record release in order to collaborate their care with an outside provider. Patient/Guardian was advised if they have not already done so to contact the registration department to sign all necessary forms in order for us  to release information regarding their care.   Consent: Patient/Guardian gives verbal consent for treatment and assignment of benefits for services provided during this visit. Patient/Guardian expressed understanding and agreed to proceed.    Gabriel Sleet, MD 03/19/2024, 12:53 PM

## 2024-03-18 ENCOUNTER — Encounter: Payer: Self-pay | Admitting: Family Medicine

## 2024-03-18 ENCOUNTER — Other Ambulatory Visit (HOSPITAL_BASED_OUTPATIENT_CLINIC_OR_DEPARTMENT_OTHER): Payer: Self-pay

## 2024-03-18 ENCOUNTER — Ambulatory Visit: Admitting: Family Medicine

## 2024-03-18 VITALS — BP 111/72 | HR 64 | Resp 18 | Ht 72.0 in | Wt 212.0 lb

## 2024-03-18 DIAGNOSIS — Z23 Encounter for immunization: Secondary | ICD-10-CM | POA: Diagnosis not present

## 2024-03-18 DIAGNOSIS — R7989 Other specified abnormal findings of blood chemistry: Secondary | ICD-10-CM

## 2024-03-18 DIAGNOSIS — R5383 Other fatigue: Secondary | ICD-10-CM | POA: Diagnosis not present

## 2024-03-18 DIAGNOSIS — E785 Hyperlipidemia, unspecified: Secondary | ICD-10-CM

## 2024-03-18 DIAGNOSIS — F322 Major depressive disorder, single episode, severe without psychotic features: Secondary | ICD-10-CM

## 2024-03-18 DIAGNOSIS — F419 Anxiety disorder, unspecified: Secondary | ICD-10-CM

## 2024-03-18 DIAGNOSIS — Z125 Encounter for screening for malignant neoplasm of prostate: Secondary | ICD-10-CM

## 2024-03-18 DIAGNOSIS — R7303 Prediabetes: Secondary | ICD-10-CM

## 2024-03-18 MED ORDER — KETOCONAZOLE 2 % EX SHAM
1.0000 | MEDICATED_SHAMPOO | CUTANEOUS | 11 refills | Status: AC
  Filled 2024-03-18: qty 120, 30d supply, fill #0
  Filled 2024-06-03: qty 120, 30d supply, fill #1

## 2024-03-18 MED ORDER — FLUTICASONE PROPIONATE 50 MCG/ACT NA SUSP
1.0000 | Freq: Every day | NASAL | 1 refills | Status: AC
  Filled 2024-03-18: qty 16, 60d supply, fill #0

## 2024-03-18 NOTE — Patient Instructions (Signed)
 F/U in 18 weeks, Hep B #3 at visit  Hep b #2 today  Flu vaccine today   Fasting cBC, lipd, cmp and EGFR, tSH, hBA1c, PSA 3 to 5 days before next appointment  It is important that you exercise regularly at least 30 minutes 5 times a week. If you develop chest pain, have severe difficulty breathing, or feel very tired, stop exercising immediately and seek medical attention   INCREASE vegetable and fruit , reduce pasta and bread  Thanks for choosing [facility], we consider it a privelige to serve you.

## 2024-03-19 ENCOUNTER — Encounter: Payer: Self-pay | Admitting: Psychiatry

## 2024-03-19 ENCOUNTER — Other Ambulatory Visit (HOSPITAL_BASED_OUTPATIENT_CLINIC_OR_DEPARTMENT_OTHER): Payer: Self-pay

## 2024-03-19 ENCOUNTER — Telehealth: Admitting: Psychiatry

## 2024-03-19 DIAGNOSIS — F419 Anxiety disorder, unspecified: Secondary | ICD-10-CM | POA: Diagnosis not present

## 2024-03-19 DIAGNOSIS — G4700 Insomnia, unspecified: Secondary | ICD-10-CM

## 2024-03-19 DIAGNOSIS — F3341 Major depressive disorder, recurrent, in partial remission: Secondary | ICD-10-CM | POA: Diagnosis not present

## 2024-03-19 MED ORDER — MIRTAZAPINE 45 MG PO TABS
45.0000 mg | ORAL_TABLET | Freq: Every day | ORAL | 0 refills | Status: AC
  Filled 2024-06-03: qty 90, 90d supply, fill #0

## 2024-03-19 MED ORDER — REXULTI 3 MG PO TABS
3.0000 mg | ORAL_TABLET | Freq: Every day | ORAL | 1 refills | Status: AC
  Filled 2024-06-03: qty 90, 90d supply, fill #0

## 2024-03-19 NOTE — Patient Instructions (Signed)
 Continue duloxetine  60 mg daily  Continue mirtazapine  45 mg at night  Continue Rexulti  3 mg at night  Continue pregabalin  75 mg twice a day. Next appointment: 1/7 at 11 30

## 2024-03-22 ENCOUNTER — Encounter: Payer: Self-pay | Admitting: Family Medicine

## 2024-03-22 DIAGNOSIS — Z125 Encounter for screening for malignant neoplasm of prostate: Secondary | ICD-10-CM | POA: Insufficient documentation

## 2024-03-22 NOTE — Assessment & Plan Note (Signed)
 Improvement noted by patient and also in office

## 2024-03-22 NOTE — Assessment & Plan Note (Signed)
 Influenza vaccine and hep B #2 administered no adverse event noted

## 2024-03-22 NOTE — Assessment & Plan Note (Signed)
 PSA  ordered positive f/h prostate cancer

## 2024-03-22 NOTE — Progress Notes (Signed)
 Gabriel [name]     MRN: [id number]      DOB: March 28, 1976  Chief Complaint  Patient presents with   Medical Management of Chronic Issues    4 month follow up     HPI Gabriel [name] is here for follow up and re-evaluation of chronic medical conditions, medication management and review of any available recent lab and radiology data.  Preventive health is updated, specifically  Cancer screening and Immunization.   Questions or concerns regarding consultations or procedures which the PT has had in the interim are  addressed. The PT denies any adverse reactions to current medications since the last visit.  There are no new concerns.  There are no specific complaints   ROS Denies recent fever or chills. Denies sinus pressure, nasal congestion, ear pain or sore throat. Denies chest congestion, productive cough or wheezing. Denies chest pains, palpitations and leg swelling Denies abdominal pain, nausea, vomiting,diarrhea or constipation.   Denies dysuria, frequency, hesitancy or incontinence. Denies joint pain, swelling and limitation in mobility. Denies headaches, seizures, numbness, or tingling. Chronic  depression, anxiety and  insomnia.Slightimprovement Denies skin break down or rash.   PE  BP 111/72   Pulse 64   Resp 18   Ht 6' (1.829 m)   Wt 212 lb (96.2 kg)   SpO2 97%   BMI 28.75 kg/m   Patient alert and oriented and in no cardiopulmonary distress.  HEENT: No facial asymmetry, EOMI,     Neck supple .  Chest: Clear to auscultation bilaterally.  CVS: S1, S2 no murmurs, no S3.Regular rate.  ABD: Soft non tender.   Ext: No edema  MS: Adequate ROM spine, shoulders, hips and knees.  Skin: Intact, no ulcerations or rash noted.  Psych: Good eye contact, flat  affect. Memory mildly impaired,  anxious mildly  depressed appearing.  CNS: CN 2-12 intact, power,  normal throughout.no focal deficits noted.   Assessment & Plan  Immunization due Influenza vaccine and hep  B #2 administered no adverse event noted  Prediabetes Patient educated about the importance of limiting  Carbohydrate intake , the need to commit to daily physical activity for a minimum of 30 minutes , and to commit weight loss. The fact that changes in all these areas will reduce or eliminate all together the development of diabetes is stressed.      Latest Ref Rng & Units 11/07/2023   10:39 AM 08/06/2023   10:24 AM 04/08/2023   11:22 AM 09/03/2022    8:28 AM 04/02/2022    8:35 AM  Diabetic Labs  HbA1c 4.8 - 5.6 % 6.4  6.5  6.3  6.3  6.2   Chol 100 - 199 mg/dL 827    844  829   HDL >60 mg/dL 46    45  43   Calc LDL 0 - 99 mg/dL 884    97  886   Triglycerides 0 - 149 mg/dL 55    64  74   Creatinine 0.76 - 1.27 mg/dL 8.95  8.97  8.88  8.85  1.15       03/18/2024    9:19 AM 01/21/2024   11:15 AM 11/07/2023    9:38 AM 08/06/2023    9:29 AM 05/02/2023   10:09 AM 04/23/2023   10:14 AM 04/08/2023    9:58 AM  BP/Weight  Systolic BP 111 119 113 123 120 119 114  Diastolic BP 72 77 72 75 88 71 78  Wt. (Lbs) 212  207.08  206 199.2  202  BMI 28.75 kg/m2  28.09 kg/m2 27.94 kg/m2 27.02 kg/m2  27.4 kg/m2       No data to display          Updated lab needed at/ before next visit.   Dyslipidemia, goal LDL below 100 Hyperlipidemia:Low fat diet discussed and encouraged.   Lipid Panel  Lab Results  Component Value Date   CHOL 172 11/07/2023   HDL 46 11/07/2023   LDLCALC 115 (H) 11/07/2023   TRIG 55 11/07/2023   CHOLHDL 3.7 11/07/2023     Updated lab needed at/ before next visit.   Elevated LFTs Weight loss and updated LFT needed prior to next visit  Depression, major, single episode, severe (HCC) Score of 14, not suicidal or homicidal, some improvement noted in past 6 to 12 n monhts, no longer in dedicated sessions with therapist, excellent family support  Anxiety Improvement noted by patient and also in office  Fatigue Lab panel when next due to include cbc and  tsh  Special screening for malignant neoplasm of prostate PSA  ordered positive f/h prostate cancer

## 2024-03-22 NOTE — Assessment & Plan Note (Signed)
 Patient educated about the importance of limiting  Carbohydrate intake , the need to commit to daily physical activity for a minimum of 30 minutes , and to commit weight loss. The fact that changes in all these areas will reduce or eliminate all together the development of diabetes is stressed.      Latest Ref Rng & Units 11/07/2023   10:39 AM 08/06/2023   10:24 AM 04/08/2023   11:22 AM 09/03/2022    8:28 AM 04/02/2022    8:35 AM  Diabetic Labs  HbA1c 4.8 - 5.6 % 6.4  6.5  6.3  6.3  6.2   Chol 100 - 199 mg/dL 827    844  829   HDL >60 mg/dL 46    45  43   Calc LDL 0 - 99 mg/dL 884    97  886   Triglycerides 0 - 149 mg/dL 55    64  74   Creatinine 0.76 - 1.27 mg/dL 8.95  8.97  8.88  8.85  1.15       03/18/2024    9:19 AM 01/21/2024   11:15 AM 11/07/2023    9:38 AM 08/06/2023    9:29 AM 05/02/2023   10:09 AM 04/23/2023   10:14 AM 04/08/2023    9:58 AM  BP/Weight  Systolic BP 111 119 113 123 120 119 114  Diastolic BP 72 77 72 75 88 71 78  Wt. (Lbs) 212  207.08 206 199.2  202  BMI 28.75 kg/m2  28.09 kg/m2 27.94 kg/m2 27.02 kg/m2  27.4 kg/m2       No data to display          Updated lab needed at/ before next visit.

## 2024-03-22 NOTE — Assessment & Plan Note (Signed)
 Lab panel when next due to include cbc and tsh

## 2024-03-22 NOTE — Assessment & Plan Note (Signed)
 Hyperlipidemia:Low fat diet discussed and encouraged.   Lipid Panel  Lab Results  Component Value Date   CHOL 172 11/07/2023   HDL 46 11/07/2023   LDLCALC 115 (H) 11/07/2023   TRIG 55 11/07/2023   CHOLHDL 3.7 11/07/2023     Updated lab needed at/ before next visit.

## 2024-03-22 NOTE — Assessment & Plan Note (Signed)
 Score of 14, not suicidal or homicidal, some improvement noted in past 6 to 12 n monhts, no longer in dedicated sessions with therapist, excellent family support

## 2024-03-22 NOTE — Assessment & Plan Note (Signed)
 Weight loss and updated LFT needed prior to next visit

## 2024-05-15 NOTE — Progress Notes (Unsigned)
 Virtual Visit via Video Note  I connected with Gabriel [name] on 05/19/2024 at 11:30 AM EST by a video enabled telemedicine application and verified that I am speaking with the correct person using two identifiers.  Location: Patient: home Provider: home office Persons participated in the visit- patient, provider    I discussed the limitations of evaluation and management by telemedicine and the availability of in person appointments. The patient expressed understanding and agreed to proceed.    I discussed the assessment and treatment plan with the patient. The patient was provided an opportunity to ask questions and all were answered. The patient agreed with the plan and demonstrated an understanding of the instructions.   The patient was advised to call back or seek an in-person evaluation if the symptoms worsen or if the condition fails to improve as anticipated.   Gabriel Sleet, MD    [facility] MD/PA/NP OP Progress Note  05/19/2024 11:59 AM Gabriel [name]  MRN:  [id number]  Chief Complaint:  Chief Complaint  Patient presents with   Follow-up   HPI:  This is a follow-up appointment for depression, anxiety and insomnia.  He states that he has been doing okay.  He stayed at home for Christmas while his family went to see his mother-in-law.  He was experiencing pain.  He reports good connection with his family otherwise.  He has some good days and bad days, which is often related to the pain.  He has been able to walk and do things when he has less pain.  He occasionally thinks about the past.  He is trying to relax by doing breathing exercise and meditation.  He also finds it helpful to be by himself, listening to just music.  His anxiety has been quite high lately.  He has middle insomnia.  He denies SI, HI, hallucinations.  His wife has been taking care of his medication.  According to the chart review, he has not filled the pregabalin  since last September.  He is wanting to restart  this medication.   Visit Diagnosis:    ICD-10-CM   1. MDD (major depressive disorder), recurrent, in partial remission  F33.41     2. Anxiety  F41.9     3. Insomnia, unspecified type  G47.00       Past Psychiatric History: Please see initial evaluation for full details. I have reviewed the history. No updates at this time.     Past Medical History:  Past Medical History:  Diagnosis Date   Allergy     Anxiety    Carpal tunnel syndrome 2018   Depression    Depression    [employer] 05/15/2020   Eczema    Elevated LFTs    Heart murmur    Insomnia    Seasonal allergies    Vitamin D  deficiency     Past Surgical History:  Procedure Laterality Date   COLONOSCOPY     2024   NO PAST SURGERIES      Family Psychiatric History: Please see initial evaluation for full details. I have reviewed the history. No updates at this time.     Family History:  Family History  Problem Relation Age of Onset   Allergic rhinitis Mother    Hypertension Mother    Colon polyps Father    Diabetes Father    Hypertension Father    Hypertension Maternal Grandfather    Diabetes Maternal Grandfather    Cancer Paternal Grandmother        type unknown  Hypertension Paternal Grandmother    Cancer Paternal Grandfather        type unknown   Hypertension Paternal Grandfather    Colon cancer Neg Hx    Esophageal cancer Neg Hx    Rectal cancer Neg Hx    Stomach cancer Neg Hx     Social History:  Social History   Socioeconomic History   Marital status: Married    Spouse name: Not on file   Number of children: 2   Years of education: 12   Highest education level: High school graduate  Occupational History   Occupation: not employed  Tobacco Use   Smoking status: Never    Passive exposure: Never   Smokeless tobacco: Never  Vaping Use   Vaping status: Never Used  Substance and Sexual Activity   Alcohol use: No    Alcohol/week: 0.0 standard drinks of alcohol   Drug use: No   Sexual  activity: Yes    Birth control/protection: None  Other Topics Concern   Not on file  Social History Narrative   He lives with wife and two children.   Unemployed at this present time   Right handed   One story home   Highest level of education:  12th grade   Social Drivers of Health   Tobacco Use: Low Risk (05/19/2024)   Patient History    Smoking Tobacco Use: Never    Smokeless Tobacco Use: Never    Passive Exposure: Never  Financial Resource Strain: Not on file  Food Insecurity: Not on file  Transportation Needs: Not on file  Physical Activity: Not on file  Stress: Not on file  Social Connections: Not on file  Depression (PHQ2-9): High Risk (03/18/2024)   Depression (PHQ2-9)    PHQ-2 Score: 14  Alcohol Screen: Not on file  Housing: Not on file  Utilities: Not on file  Health Literacy: Not on file    Allergies: Allergies[1]  Metabolic Disorder Labs: Lab Results  Component Value Date   HGBA1C 6.4 (H) 11/07/2023   MPG 137 05/27/2019   MPG 137 12/29/2018   No results found for: PROLACTIN Lab Results  Component Value Date   CHOL 172 11/07/2023   TRIG 55 11/07/2023   HDL 46 11/07/2023   CHOLHDL 3.7 11/07/2023   VLDL 10 12/04/2016   LDLCALC 115 (H) 11/07/2023   LDLCALC 97 09/03/2022   Lab Results  Component Value Date   TSH 1.800 08/06/2023   TSH 1.440 09/03/2022    Therapeutic Level Labs: No results found for: LITHIUM No results found for: VALPROATE No results found for: CBMZ  Current Medications: Current Outpatient Medications  Medication Sig Dispense Refill   [START ON 06/18/2024] pregabalin  (LYRICA ) 75 MG capsule Take 1 capsule (75 mg total) by mouth 2 (two) times daily. 60 capsule 1   amoxicillin -clavulanate (AUGMENTIN ) 875-125 MG tablet Take 1 tablet by mouth every 12 (twelve) hours. 14 tablet 0   amoxicillin -clavulanate (AUGMENTIN ) 875-125 MG tablet Take 1 tablet by mouth every 12 (twelve) hours. 14 tablet 0   azelastine  (ASTELIN ) 0.1 % nasal  spray Place 2 sprays into both nostrils 2 (two) times daily. Use in each nostril as directed 30 mL 0   azelastine  (ASTELIN ) 0.1 % nasal spray Place 2 sprays into both nostrils 2 (two) times daily. Use in each nostril as directed 30 mL 0   Brexpiprazole  (REXULTI ) 3 MG TABS Take 1 tablet (3 mg total) by mouth daily. 90 tablet 1   cetirizine  (ZYRTEC ) 10 MG  tablet Take 1 tablet (10 mg total) by mouth daily. 30 tablet 0   cetirizine  (ZYRTEC ) 10 MG tablet Take 1 tablet (10 mg total) by mouth daily. 30 tablet 0   DULoxetine  (CYMBALTA ) 60 MG capsule Take 1 capsule (60 mg total) by mouth daily. 90 capsule 1   fluticasone  (FLONASE ) 50 MCG/ACT nasal spray Place 1 spray into both nostrils daily. 48 g 1   ketoconazole  (NIZORAL ) 2 % shampoo Apply to face twice weekly and back daily for 1 week, then twice weekly thereafter. 120 mL 11   loratadine  (CLARITIN ) 10 MG tablet Take 10 mg by mouth daily as needed.     meclizine  (ANTIVERT ) 25 MG tablet Take 1 tablet (25 mg total) by mouth 3 (three) times daily as needed for dizziness. 30 tablet 1   mirtazapine  (REMERON ) 45 MG tablet Take 1 tablet (45 mg total) by mouth at bedtime. 90 tablet 0   NONFORMULARY OR COMPOUNDED ITEM Terbinafine 3% Fluconazole 2% Tea Tree Oil 5% Urea 10% Ibuprofen  2% in DMSO Suspension #30ml Apply to affected nails once at bedtime or twice daily     topiramate  (TOPAMAX ) 100 MG tablet Take 1 tablet (100 mg total) by mouth daily. 90 tablet 3   tretinoin (RETIN-A) 0.025 % cream Apply 1 application topically at bedtime.     No current facility-administered medications for this visit.     Musculoskeletal: Strength & Muscle Tone: N/A Gait & Station: N/A Patient leans: N/A  Psychiatric Specialty Exam: Review of Systems  Psychiatric/Behavioral:  Positive for dysphoric mood and sleep disturbance. Negative for agitation, behavioral problems, confusion, decreased concentration, hallucinations, self-injury and suicidal ideas. The patient is  nervous/anxious. The patient is not hyperactive.   All other systems reviewed and are negative.   There were no vitals taken for this visit.There is no height or weight on file to calculate BMI.  General Appearance: Well Groomed  Eye Contact:  Good  Speech:  Clear and Coherent  Volume:  Normal  Mood:  okay  Affect:  Appropriate, Congruent, and calm  Thought Process:  Coherent  Orientation:  Full (Time, Place, and Person)  Thought Content: Logical   Suicidal Thoughts:  No  Homicidal Thoughts:  No  Memory:  Immediate;   Good  Judgement:  Good  Insight:  Good  Psychomotor Activity:  Normal  Concentration:  Concentration: Good and Attention Span: Good  Recall:  Good  Fund of Knowledge: Good  Language: Good  Akathisia:  No  Handed:  Right  AIMS (if indicated): not done  Assets:  Communication Skills Desire for Improvement  ADL's:  Intact  Cognition: WNL  Sleep:  Poor   Screenings: GAD-7    Flowsheet Row Office Visit from 03/18/2024 in [facility] Office Visit from 11/07/2023 in [facility] Primary [facility] Counselor from 09/25/2023 in [facility] at [place] Counselor from 08/28/2023 in [facility] at [place] Office Visit from 08/06/2023 in [facility] Primary Care  Total GAD-7 Score 14 16 9 16 17    PHQ2-9    Flowsheet Row Office Visit from 03/18/2024 in [facility] Primary Care Office Visit from 11/07/2023 in [facility] Office Visit from 08/06/2023 in [facility] Primary Care Office Visit from 04/08/2023 in [facility] Primary Care Counselor from 03/24/2023 in [facility] at [facility] Total Score 2 2 2  0 2  PHQ-9 Total Score 14 14 12  -- 9  Flowsheet Row UC from 05/16/2024 in [facility] at [facility] from 03/24/2023 in [facility] at  [place] UC from 02/20/2023 in [facility] at [place]  C-SSRS RISK CATEGORY No Risk No Risk No Risk     Assessment and Plan:  Gabriel [name] is a 49 y.o. year old male with a history of depression, bilateral carpel tunnel syndrome, who presents for follow up appointment for below.   1. MDD (major depressive disorder), recurrent, in partial remission 2. Anxiety # r/o PTSD The patient experiences chronic pain, which significantly impacts his daily functioning. Socially, he has been unemployed since being laid off, an event he perceives as related to racial discrimination. In childhood, he was raised by his mother, who instilled in him values of love and respect. History:  not interested in TMS  He reports worsening in anxiety, insomnia, although depressive symptoms has been overall manageable.  It is found out that he has not filled pregabalin  in the last few months.  He is willing to restart this medication to target anxiety, neuropathic pain and insomnia.  Discussed potential risk of drowsiness.  Will continue duloxetine  and mirtazapine  to target depression.   3. Insomnia, unspecified type  - sleep study was not consistent with sleep apnea in 2023        He continues to experience middle insomnia.  Pregabalin  will be restarted to address his insomnia and mood symptoms as outlined above.  May consider orexin antagonist in the future if he has limited benefit from this.    Plan   Continue duloxetine  60 mg daily (limited benefit from higher dose)  Continue mirtazapine  45 mg at night - monitor appetite Continue Rexulti  3 mg at night - monitor drowsiness  Restart pregabalin  75 mg twice a day. Next appointment: 2/18 at 11 am for 30 mins , video [email or web address].  - on tramadol    - on topiramate  for migraine   Past trials of medication: sertraline , fluoxetine , venlafaxine , mirtazapine , nortriptyline  for carpel tunnel syndrome, bupropion  (drowsiness), quetiapine   (drowsiness), Abilify  (off balance), Trazodone , Ambien  (limited benefit),  temazepam , Lunesta , lorazepam  (worsening in anxiety, drowsiness) , prazosin  (drowsiness)    The patient demonstrates the following risk factors for suicide: Chronic risk factors for suicide include: psychiatric disorder of depression. Acute risk factors for suicide include: unemployment and loss (financial, interpersonal, professional). Protective factors for this patient include: positive social support, responsibility to others (children, family) and hope for the future. Considering these factors, the overall suicide risk at this point appears to be low. Patient is appropriate for outpatient follow up.  Collaboration of Care: Collaboration of Care: Other reviewed notes in Epic  Patient/Guardian was advised Release of Information must be obtained prior to any record release in order to collaborate their care with an outside provider. Patient/Guardian was advised if they have not already done so to contact the registration department to sign all necessary forms in order for us  to release information regarding their care.   Consent: Patient/Guardian gives verbal consent for treatment and assignment of benefits for services provided during this visit. Patient/Guardian expressed understanding and agreed to proceed.    Gabriel Sleet, MD 05/19/2024, 11:59 AM     [1] No Known Allergies

## 2024-05-16 ENCOUNTER — Telehealth: Payer: Self-pay | Admitting: Nurse Practitioner

## 2024-05-16 ENCOUNTER — Encounter

## 2024-05-16 ENCOUNTER — Ambulatory Visit
Admission: EM | Admit: 2024-05-16 | Discharge: 2024-05-16 | Disposition: A | Discharge status: Home / Self Care | Attending: Nurse Practitioner | Admitting: Nurse Practitioner

## 2024-05-16 DIAGNOSIS — J0140 Acute pansinusitis, unspecified: Secondary | ICD-10-CM | POA: Diagnosis not present

## 2024-05-16 LAB — POC SOFIA SARS ANTIGEN FIA: SARS Coronavirus 2 Ag: NEGATIVE

## 2024-05-16 MED ORDER — CETIRIZINE HCL 10 MG PO TABS: 10.0000 mg | ORAL_TABLET | Freq: Every day | ORAL | 0 refills | Status: AC

## 2024-05-16 MED ORDER — AZELASTINE HCL 0.1 % NA SOLN: 2.0000 | Freq: Two times a day (BID) | NASAL | 0 refills | Status: AC

## 2024-05-16 MED ORDER — AMOXICILLIN-POT CLAVULANATE 875-125 MG PO TABS: 1.0000 | ORAL_TABLET | Freq: Two times a day (BID) | ORAL | 0 refills | Status: AC

## 2024-05-16 NOTE — Discharge Instructions (Signed)
 Take medication as directed. I have sent prescriptions for azelastine  and cetirizine  for you to use for your allergy  symptoms. Increase fluids and get plenty of rest. May take over-the-counter Tylenol  as needed for pain, fever, or general discomfort. Recommend normal saline nasal spray to help with nasal congestion throughout the day. For your cough, it may be helpful to use a humidifier at bedtime during sleep. If symptoms fail to improve with this treatment, recommend follow-up with your primary care physician for further evaluation. Follow-up as needed.

## 2024-05-16 NOTE — Addendum Note (Signed)
 Addended by: GILMER ETTA PARAS on: 05/16/2024 01:13 PM   Modules accepted: Orders

## 2024-05-16 NOTE — Telephone Encounter (Signed)
 Patient requesting prescriptions be sent to [facility] on [address] as pharmacy information provided during the visit is closed today. Will send prescriptions to [facility] on [address] in [place].

## 2024-05-16 NOTE — ED Provider Notes (Signed)
 " [facility]    CSN: [id number] Arrival date & time: 05/16/24  1001      History   Chief Complaint Chief Complaint  Patient presents with   Nasal Congestion    HPI Gabriel [name] is a 49 y.o. male.   The history is provided by the patient.   Patient presents with a 5-day history of fever, nasal congestion, headache, sinus pressure, and productive cough.  Patient states his last fever was last evening which was around 102.  He denies ear pain, ear drainage, wheezing, difficulty breathing, chest pain, abdominal pain, nausea, vomiting, diarrhea, or rash.  Patient reports underlying history of seasonal allergies.  States he has been taking Claritin  and using Flonase .  Also states he has been taking NyQuil for his symptoms with minimal relief.  States that his son has been sick with the same or similar symptoms.  Past Medical History:  Diagnosis Date   Allergy     Anxiety    Carpal tunnel syndrome 2018   Depression    Depression    [employer] 05/15/2020   Eczema    Elevated LFTs    Heart murmur    Insomnia    Seasonal allergies    Vitamin D  deficiency     Patient Active Problem List   Diagnosis Date Noted   Special screening for malignant neoplasm of prostate 03/22/2024   Immunization due 11/07/2023   Annual visit for general adult medical examination with abnormal findings 04/08/2023   FH: prostate cancer 04/08/2023   Fatigue 09/02/2021   Intractable migraine without aura and with status migrainosus 07/16/2020   Migraine 07/11/2020   Infection of toenail 03/15/2020   Tinea versicolor 03/15/2020   Prediabetes 01/31/2020   Dyslipidemia, goal LDL below 100 06/14/2019   Right lumbar radiculopathy 05/31/2019   Elevated LFTs 05/31/2019   Discolored nails 11/01/2018   Anxiety 01/29/2018   Depression, major, single episode, severe (HCC) 01/29/2018   Vitamin D  deficiency 01/29/2018   Acne vulgaris 01/15/2018   Seborrheic dermatitis 01/15/2018   Traumatic  injury to skin or subcutaneous tissue 01/15/2018   Bilateral carpal tunnel syndrome 01/14/2018   Insomnia 07/11/2017   Chronic hand pain 07/10/2017   Overweight (BMI 25.0-29.9) 07/03/2013   Perennial allergic rhinitis 10/19/2012   Intrinsic atopic dermatitis 10/19/2012    Past Surgical History:  Procedure Laterality Date   COLONOSCOPY     2024   NO PAST SURGERIES         Home Medications    Prior to Admission medications  Medication Sig Start Date End Date Taking? Authorizing Provider  Brexpiprazole  (REXULTI ) 3 MG TABS Take 1 tablet (3 mg total) by mouth daily. 04/26/24 10/23/24  Vickey Mettle, MD  DULoxetine  (CYMBALTA ) 60 MG capsule Take 1 capsule (60 mg total) by mouth daily. 01/30/24 07/28/24  Vickey Mettle, MD  fluticasone  (FLONASE ) 50 MCG/ACT nasal spray Place 1 spray into both nostrils daily. 03/18/24   Antonetta Rollene BRAVO, MD  ketoconazole  (NIZORAL ) 2 % shampoo Apply to face twice weekly and back daily for 1 week, then twice weekly thereafter. 03/18/24   Antonetta Rollene BRAVO, MD  loratadine  (CLARITIN ) 10 MG tablet Take 10 mg by mouth daily as needed. 10/19/12   [provider]  meclizine  (ANTIVERT ) 25 MG tablet Take 1 tablet (25 mg total) by mouth 3 (three) times daily as needed for dizziness. 01/21/24   Tobie Suzzane POUR, MD  mirtazapine  (REMERON ) 45 MG tablet Take 1 tablet (45 mg total) by mouth at bedtime. 05/02/24  07/31/24  Vickey Mettle, MD  NONFORMULARY OR COMPOUNDED ITEM Terbinafine 3% Fluconazole 2% Tea Tree Oil 5% Urea 10% Ibuprofen  2% in DMSO Suspension #30ml Apply to affected nails once at bedtime or twice daily 09/10/22   Gershon Donnice SAUNDERS, DPM  pregabalin  (LYRICA ) 75 MG capsule Take 1 capsule (75 mg total) by mouth 2 (two) times daily. 02/26/24 03/27/24  Vickey Mettle, MD  topiramate  (TOPAMAX ) 100 MG tablet Take 1 tablet (100 mg total) by mouth daily. 11/07/23   Antonetta Rollene BRAVO, MD  tretinoin (RETIN-A) 0.025 % cream Apply 1 application topically at bedtime.  10/13/18   [provider]    Family History Family History  Problem Relation Age of Onset   Allergic rhinitis Mother    Hypertension Mother    Colon polyps Father    Diabetes Father    Hypertension Father    Hypertension Maternal Grandfather    Diabetes Maternal Grandfather    Cancer Paternal Grandmother        type unknown   Hypertension Paternal Grandmother    Cancer Paternal Grandfather        type unknown   Hypertension Paternal Grandfather    Colon cancer Neg Hx    Esophageal cancer Neg Hx    Rectal cancer Neg Hx    Stomach cancer Neg Hx     Social History Social History[1]   Allergies   Patient has no known allergies.   Review of Systems Review of Systems Per HPI  Physical Exam Triage Vital Signs ED Triage Vitals  Encounter Vitals Group     BP 05/16/24 1035 115/80     Girls Systolic BP Percentile --      Girls Diastolic BP Percentile --      Boys Systolic BP Percentile --      Boys Diastolic BP Percentile --      Pulse Rate 05/16/24 1035 94     Resp 05/16/24 1035 16     Temp 05/16/24 1035 98.5 F (36.9 C)     Temp Source 05/16/24 1035 Oral     SpO2 05/16/24 1035 97 %     Weight --      Height --      Head Circumference --      Peak Flow --      Pain Score 05/16/24 1032 0     Pain Loc --      Pain Education --      Exclude from Growth Chart --    No data found.  Updated Vital Signs BP 115/80 (BP Location: Right Arm)   Pulse 94   Temp 98.5 F (36.9 C) (Oral)   Resp 16   SpO2 97%   Visual Acuity Right Eye Distance:   Left Eye Distance:   Bilateral Distance:    Right Eye Near:   Left Eye Near:    Bilateral Near:     Physical Exam Vitals and nursing note reviewed.  Constitutional:      General: He is not in acute distress.    Appearance: Normal appearance. He is well-developed.  HENT:     Head: Normocephalic and atraumatic.     Right Ear: Tympanic membrane, ear canal and external ear normal.     Left Ear: Tympanic  membrane, ear canal and external ear normal.     Nose: Congestion present.     Right Turbinates: Enlarged and swollen.     Left Turbinates: Enlarged and swollen.     Right Sinus: Maxillary sinus  tenderness and frontal sinus tenderness present.     Left Sinus: Maxillary sinus tenderness and frontal sinus tenderness present.     Mouth/Throat:     Lips: Pink.     Mouth: Mucous membranes are moist.     Pharynx: Uvula midline. Posterior oropharyngeal erythema and postnasal drip present. No pharyngeal swelling, oropharyngeal exudate or uvula swelling.     Comments: Cobblestoning present to posterior oropharynx  Eyes:     Extraocular Movements: Extraocular movements intact.     Conjunctiva/sclera: Conjunctivae normal.     Pupils: Pupils are equal, round, and reactive to light.  Neck:     Thyroid : No thyromegaly.     Trachea: No tracheal deviation.  Cardiovascular:     Rate and Rhythm: Normal rate and regular rhythm.     Pulses: Normal pulses.     Heart sounds: Normal heart sounds.  Pulmonary:     Effort: Pulmonary effort is normal. No respiratory distress.     Breath sounds: Normal breath sounds. No stridor. No wheezing, rhonchi or rales.  Abdominal:     General: Bowel sounds are normal.     Palpations: Abdomen is soft.     Tenderness: There is no abdominal tenderness.  Musculoskeletal:     Cervical back: Normal range of motion and neck supple.  Skin:    General: Skin is warm and dry.  Neurological:     General: No focal deficit present.     Mental Status: He is alert and oriented to person, place, and time.  Psychiatric:        Mood and Affect: Mood normal.        Behavior: Behavior normal.        Thought Content: Thought content normal.        Judgment: Judgment normal.      UC Treatments / Results  Labs (all labs ordered are listed, but only abnormal results are displayed) Labs Reviewed  POC SOFIA SARS ANTIGEN FIA    EKG   Radiology No results  found.  Procedures Procedures (including critical care time)  Medications Ordered in UC Medications - No data to display  Initial Impression / Assessment and Plan / UC Course  I have reviewed the triage vital signs and the nursing notes.  Pertinent labs & imaging results that were available during my care of the patient were reviewed by me and considered in my medical decision making (see chart for details).  COVID test is negative.  On exam, the patient's lung sounds are clear throughout, room air sats are at 97%.  He endorses intermittent fever, with his highest fever around 102.  On exam, he does exhibit frontal and maxillary sinus tenderness.  Concern for bacterial etiology at this time.  Will treat for bacterial pansinusitis with Augmentin  875/125 mg, azelastine  nasal spray for nasal congestion, and cetirizine  for postnasal drainage.  Supportive care recommendations were provided and discussed with the patient to include fluids, rest, over-the-counter analgesics, and use of normal saline nasal spray.  Discussed indications with patient regarding follow-up.  Patient was in agreement with this plan of care and verbalizes understanding.  All questions were answered.  Patient stable for discharge.   Final Clinical Impressions(s) / UC Diagnoses   Final diagnoses:  Acute pansinusitis, recurrence not specified   Discharge Instructions   None    ED Prescriptions   None    PDMP not reviewed this encounter.     [1]  Social History Tobacco Use   Smoking status:  Never    Passive exposure: Never   Smokeless tobacco: Never  Vaping Use   Vaping status: Never Used  Substance Use Topics   Alcohol use: No    Alcohol/week: 0.0 standard drinks of alcohol   Drug use: No     Gilmer Etta PARAS, NP 05/16/24 1106  "

## 2024-05-16 NOTE — ED Triage Notes (Addendum)
 Pt states congestion for the past 5 days.  States he is coughing up green phlegm. States he has a fever which has resolved.  States he is taking Nyquil at home.

## 2024-05-19 ENCOUNTER — Other Ambulatory Visit (HOSPITAL_BASED_OUTPATIENT_CLINIC_OR_DEPARTMENT_OTHER): Payer: Self-pay

## 2024-05-19 ENCOUNTER — Encounter: Payer: Self-pay | Admitting: Psychiatry

## 2024-05-19 ENCOUNTER — Telehealth: Admitting: Psychiatry

## 2024-05-19 DIAGNOSIS — F419 Anxiety disorder, unspecified: Secondary | ICD-10-CM | POA: Diagnosis not present

## 2024-05-19 DIAGNOSIS — F3341 Major depressive disorder, recurrent, in partial remission: Secondary | ICD-10-CM

## 2024-05-19 DIAGNOSIS — G4700 Insomnia, unspecified: Secondary | ICD-10-CM

## 2024-05-19 MED ORDER — PREGABALIN 75 MG PO CAPS
75.0000 mg | ORAL_CAPSULE | Freq: Two times a day (BID) | ORAL | 1 refills | Status: AC
  Filled 2024-05-19: qty 60, 30d supply, fill #0

## 2024-05-19 NOTE — Patient Instructions (Signed)
 Continue duloxetine  60 mg daily  Continue mirtazapine  45 mg at night  Continue Rexulti  3 mg at night   Restart pregabalin  75 mg twice a day. Next appointment: 2/18 at 11 am

## 2024-06-03 ENCOUNTER — Other Ambulatory Visit: Payer: Self-pay

## 2024-06-03 ENCOUNTER — Other Ambulatory Visit (HOSPITAL_BASED_OUTPATIENT_CLINIC_OR_DEPARTMENT_OTHER): Payer: Self-pay

## 2024-06-04 ENCOUNTER — Other Ambulatory Visit: Payer: Self-pay

## 2024-06-04 ENCOUNTER — Other Ambulatory Visit (HOSPITAL_BASED_OUTPATIENT_CLINIC_OR_DEPARTMENT_OTHER): Payer: Self-pay

## 2024-06-30 ENCOUNTER — Telehealth: Admitting: Psychiatry

## 2024-07-19 ENCOUNTER — Ambulatory Visit: Admitting: Family Medicine

## 2024-07-22 ENCOUNTER — Ambulatory Visit: Admitting: Family Medicine
